# Patient Record
Sex: Female | Born: 1950 | Race: White | Hispanic: No
Health system: Western US, Academic
[De-identification: ages and names within clinical notes are randomized; demographics above are authoritative.]

## PROBLEM LIST (undated history)

## (undated) DIAGNOSIS — F32A Depression, unspecified: Secondary | ICD-10-CM

## (undated) DIAGNOSIS — R112 Nausea with vomiting, unspecified: Secondary | ICD-10-CM

## (undated) DIAGNOSIS — H269 Unspecified cataract: Secondary | ICD-10-CM

## (undated) DIAGNOSIS — R7302 Impaired glucose tolerance (oral): Secondary | ICD-10-CM

## (undated) DIAGNOSIS — I10 Essential (primary) hypertension: Secondary | ICD-10-CM

## (undated) DIAGNOSIS — E119 Type 2 diabetes mellitus without complications: Secondary | ICD-10-CM

## (undated) DIAGNOSIS — K589 Irritable bowel syndrome without diarrhea: Secondary | ICD-10-CM

## (undated) DIAGNOSIS — E1110 Type 2 diabetes mellitus with ketoacidosis without coma: Secondary | ICD-10-CM

## (undated) DIAGNOSIS — Z9889 Other specified postprocedural states: Secondary | ICD-10-CM

## (undated) DIAGNOSIS — F419 Anxiety disorder, unspecified: Secondary | ICD-10-CM

## (undated) DIAGNOSIS — T7840XA Allergy, unspecified, initial encounter: Secondary | ICD-10-CM

## (undated) DIAGNOSIS — F329 Major depressive disorder, single episode, unspecified: Secondary | ICD-10-CM

## (undated) DIAGNOSIS — K219 Gastro-esophageal reflux disease without esophagitis: Secondary | ICD-10-CM

## (undated) DIAGNOSIS — E785 Hyperlipidemia, unspecified: Secondary | ICD-10-CM

## (undated) DIAGNOSIS — K52839 Microscopic colitis, unspecified: Secondary | ICD-10-CM

## (undated) DIAGNOSIS — J45909 Unspecified asthma, uncomplicated: Secondary | ICD-10-CM

## (undated) DIAGNOSIS — Z923 Personal history of irradiation: Secondary | ICD-10-CM

## (undated) HISTORY — PX: PR TOTAL ABDOMINAL HYSTERECT W/WO RMVL TUBE OVARY: 58150

## (undated) HISTORY — DX: Microscopic colitis, unspecified: K52.839

## (undated) HISTORY — PX: SURGICAL HX OTHER: 99

## (undated) HISTORY — PX: HERNIA REPAIR: SHX5222

## (undated) HISTORY — PX: PR ARTHRP KNE CONDYLE&PLATU MEDIAL&LAT COMPARTMENTS: 27447

## (undated) HISTORY — PX: BREAST BIOPSY: SHX5072

## (undated) HISTORY — PX: BREAST LUMPECTOMY: SHX5074

## (undated) HISTORY — DX: Unspecified asthma, uncomplicated: J45.909

## (undated) HISTORY — DX: Gastro-esophageal reflux disease without esophagitis: K21.9

## (undated) HISTORY — DX: Depression, unspecified: F32.A

## (undated) HISTORY — DX: Essential (primary) hypertension: I10

## (undated) HISTORY — DX: Other specified postprocedural states: Z98.890

## (undated) HISTORY — PX: POLYPECTOMY: SHX149

## (undated) HISTORY — DX: Unspecified cataract: H26.9

## (undated) HISTORY — DX: Type 2 diabetes mellitus with ketoacidosis without coma: E11.10

## (undated) HISTORY — DX: Anxiety disorder, unspecified: F41.9

## (undated) HISTORY — DX: Impaired glucose tolerance (oral): R73.02

## (undated) HISTORY — DX: Nausea with vomiting, unspecified: R11.2

## (undated) HISTORY — PX: OOPHORECTOMY: SHX86

## (undated) HISTORY — PX: ABDOMINAL HYSTERECTOMY: SHX81

## (undated) HISTORY — PX: COLONOSCOPY: SHX174

## (undated) HISTORY — DX: Hyperlipidemia, unspecified: E78.5

## (undated) HISTORY — DX: Major depressive disorder, single episode, unspecified: F32.9

## (undated) HISTORY — DX: Irritable bowel syndrome, unspecified: K58.9

## (undated) HISTORY — DX: Type 2 diabetes mellitus without complications: E11.9

## (undated) HISTORY — PX: OTHER SURGICAL HISTORY: SHX169

## (undated) HISTORY — DX: Allergy, unspecified, initial encounter: T78.40XA

---
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
## 1992-02-21 HISTORY — PX: PR VAGINAL HYSTERECTOMY UTERUS 250 GM/<: 58260

## 1999-03-30 ENCOUNTER — Encounter: Payer: Self-pay | Admitting: Internal Medicine

## 1999-03-30 ENCOUNTER — Ambulatory Visit (HOSPITAL_COMMUNITY): Admission: RE | Admit: 1999-03-30 | Discharge: 1999-03-30 | Payer: Self-pay | Admitting: Internal Medicine

## 1999-10-18 ENCOUNTER — Other Ambulatory Visit: Admission: RE | Admit: 1999-10-18 | Discharge: 1999-10-18 | Payer: Self-pay | Admitting: Obstetrics and Gynecology

## 2000-10-18 ENCOUNTER — Other Ambulatory Visit: Admission: RE | Admit: 2000-10-18 | Discharge: 2000-10-18 | Payer: Self-pay | Admitting: Obstetrics and Gynecology

## 2002-05-15 ENCOUNTER — Encounter: Payer: Self-pay | Admitting: Internal Medicine

## 2002-05-15 ENCOUNTER — Encounter: Admission: RE | Admit: 2002-05-15 | Discharge: 2002-05-15 | Payer: Self-pay | Admitting: Internal Medicine

## 2002-11-18 ENCOUNTER — Emergency Department (HOSPITAL_COMMUNITY): Admission: EM | Admit: 2002-11-18 | Discharge: 2002-11-18 | Payer: Self-pay | Admitting: Emergency Medicine

## 2002-11-18 ENCOUNTER — Encounter: Payer: Self-pay | Admitting: Emergency Medicine

## 2004-01-07 ENCOUNTER — Ambulatory Visit: Payer: Self-pay | Admitting: Internal Medicine

## 2004-01-12 ENCOUNTER — Ambulatory Visit: Payer: Self-pay | Admitting: Internal Medicine

## 2004-02-21 HISTORY — PX: PR COLONOSCOPY STOMA DX INCLUDING COLLJ SPEC SPX: 44388

## 2004-03-09 ENCOUNTER — Ambulatory Visit (HOSPITAL_BASED_OUTPATIENT_CLINIC_OR_DEPARTMENT_OTHER): Admission: RE | Admit: 2004-03-09 | Discharge: 2004-03-09 | Payer: Self-pay | Admitting: Surgery

## 2004-03-09 ENCOUNTER — Ambulatory Visit (HOSPITAL_COMMUNITY): Admission: RE | Admit: 2004-03-09 | Discharge: 2004-03-09 | Payer: Self-pay | Admitting: Surgery

## 2004-03-09 ENCOUNTER — Encounter (INDEPENDENT_AMBULATORY_CARE_PROVIDER_SITE_OTHER): Payer: Self-pay | Admitting: *Deleted

## 2004-04-06 ENCOUNTER — Ambulatory Visit: Payer: Self-pay | Admitting: Internal Medicine

## 2004-04-13 ENCOUNTER — Ambulatory Visit: Payer: Self-pay | Admitting: Internal Medicine

## 2004-05-12 ENCOUNTER — Ambulatory Visit: Payer: Self-pay | Admitting: Internal Medicine

## 2004-05-13 ENCOUNTER — Ambulatory Visit (HOSPITAL_COMMUNITY): Admission: RE | Admit: 2004-05-13 | Discharge: 2004-05-13 | Payer: Self-pay | Admitting: Internal Medicine

## 2004-05-17 ENCOUNTER — Ambulatory Visit (HOSPITAL_COMMUNITY): Admission: RE | Admit: 2004-05-17 | Discharge: 2004-05-17 | Payer: Self-pay | Admitting: Obstetrics and Gynecology

## 2004-05-19 ENCOUNTER — Other Ambulatory Visit: Admission: RE | Admit: 2004-05-19 | Discharge: 2004-05-19 | Payer: Self-pay | Admitting: Obstetrics and Gynecology

## 2004-07-22 ENCOUNTER — Ambulatory Visit: Payer: Self-pay | Admitting: Internal Medicine

## 2004-07-29 ENCOUNTER — Ambulatory Visit: Payer: Self-pay | Admitting: Internal Medicine

## 2005-07-18 ENCOUNTER — Ambulatory Visit (HOSPITAL_COMMUNITY): Admission: RE | Admit: 2005-07-18 | Discharge: 2005-07-18 | Payer: Self-pay | Admitting: Internal Medicine

## 2005-09-05 ENCOUNTER — Ambulatory Visit: Payer: Self-pay | Admitting: Internal Medicine

## 2006-02-20 DIAGNOSIS — C50919 Malignant neoplasm of unspecified site of unspecified female breast: Secondary | ICD-10-CM

## 2006-02-20 HISTORY — DX: Malignant neoplasm of unspecified site of unspecified female breast: C50.919

## 2006-03-13 ENCOUNTER — Ambulatory Visit: Payer: Self-pay | Admitting: Internal Medicine

## 2006-03-13 LAB — CONVERTED CEMR LAB
ALT: 16 units/L (ref 0–40)
Alkaline Phosphatase: 59 units/L (ref 39–117)
BUN: 12 mg/dL (ref 6–23)
Basophils Relative: 1.4 % — ABNORMAL HIGH (ref 0.0–1.0)
Calcium: 9.5 mg/dL (ref 8.4–10.5)
Cholesterol: 257 mg/dL (ref 0–200)
Glucose, Bld: 116 mg/dL — ABNORMAL HIGH (ref 70–99)
HDL: 55.9 mg/dL (ref >39.0)
Monocytes Relative: 6.3 % (ref 3.0–11.0)
Neutro Abs: 2.8 10*3/uL (ref 1.4–7.7)
Platelets: 290 10*3/uL (ref 150–400)
Potassium: 3.8 meq/L (ref 3.5–5.1)
RDW: 12.6 % (ref 11.5–14.6)
TSH: 3.91 microintl units/mL (ref 0.35–5.50)
Total CHOL/HDL Ratio: 4.6
Total Protein: 6.9 g/dL (ref 6.0–8.3)
VLDL: 43 mg/dL — ABNORMAL HIGH (ref 0–40)

## 2006-09-15 ENCOUNTER — Encounter: Payer: Self-pay | Admitting: Internal Medicine

## 2006-09-15 DIAGNOSIS — E785 Hyperlipidemia, unspecified: Secondary | ICD-10-CM | POA: Insufficient documentation

## 2006-09-15 DIAGNOSIS — R7309 Other abnormal glucose: Secondary | ICD-10-CM

## 2007-05-08 ENCOUNTER — Ambulatory Visit: Payer: Self-pay | Admitting: Internal Medicine

## 2007-05-08 DIAGNOSIS — K222 Esophageal obstruction: Secondary | ICD-10-CM | POA: Insufficient documentation

## 2007-05-08 DIAGNOSIS — R079 Chest pain, unspecified: Secondary | ICD-10-CM

## 2007-05-08 DIAGNOSIS — F4323 Adjustment disorder with mixed anxiety and depressed mood: Secondary | ICD-10-CM

## 2007-05-08 DIAGNOSIS — R5381 Other malaise: Secondary | ICD-10-CM | POA: Insufficient documentation

## 2007-05-08 DIAGNOSIS — R5383 Other fatigue: Secondary | ICD-10-CM

## 2007-05-08 DIAGNOSIS — E739 Lactose intolerance, unspecified: Secondary | ICD-10-CM

## 2007-05-08 DIAGNOSIS — K219 Gastro-esophageal reflux disease without esophagitis: Secondary | ICD-10-CM

## 2007-05-08 DIAGNOSIS — K589 Irritable bowel syndrome without diarrhea: Secondary | ICD-10-CM

## 2007-05-09 ENCOUNTER — Telehealth (INDEPENDENT_AMBULATORY_CARE_PROVIDER_SITE_OTHER): Payer: Self-pay | Admitting: *Deleted

## 2007-05-09 LAB — CONVERTED CEMR LAB
ALT: 25 units/L (ref 0–35)
AST: 26 units/L (ref 0–37)
Basophils Relative: 0.9 % (ref 0.0–1.0)
Bilirubin, Direct: 0.1 mg/dL (ref 0.0–0.3)
CO2: 30 meq/L (ref 19–32)
Calcium: 10.7 mg/dL — ABNORMAL HIGH (ref 8.4–10.5)
Chloride: 99 meq/L (ref 96–112)
Creatinine, Ser: 0.8 mg/dL (ref 0.4–1.2)
Eosinophils Relative: 2.7 % (ref 0.0–5.0)
Glucose, Bld: 137 mg/dL — ABNORMAL HIGH (ref 70–99)
HDL: 48.3 mg/dL (ref >39.0)
Lymphocytes Relative: 35.2 % (ref 12.0–46.0)
Neutro Abs: 4 10*3/uL (ref 1.4–7.7)
Platelets: 324 10*3/uL (ref 150–400)
RBC: 5.24 M/uL — ABNORMAL HIGH (ref 3.87–5.11)
RDW: 12.2 % (ref 11.5–14.6)
Total Bilirubin: 0.8 mg/dL (ref 0.3–1.2)
Total CHOL/HDL Ratio: 7.1
Total Protein: 7.7 g/dL (ref 6.0–8.3)
VLDL: 45 mg/dL — ABNORMAL HIGH (ref 0–40)
WBC: 7.4 10*3/uL (ref 4.5–10.5)

## 2007-05-10 ENCOUNTER — Telehealth: Payer: Self-pay | Admitting: Internal Medicine

## 2007-05-14 ENCOUNTER — Ambulatory Visit: Payer: Self-pay

## 2007-05-14 ENCOUNTER — Encounter: Payer: Self-pay | Admitting: Internal Medicine

## 2007-05-16 ENCOUNTER — Ambulatory Visit: Payer: Self-pay | Admitting: Internal Medicine

## 2007-05-16 LAB — CONVERTED CEMR LAB
Alkaline Phosphatase: 66 units/L (ref 39–117)
Bilirubin, Direct: 0.1 mg/dL (ref 0.0–0.3)
HDL: 43.3 mg/dL (ref >39.0)
Total CHOL/HDL Ratio: 5.1
VLDL: 23 mg/dL (ref 0–40)

## 2007-05-21 ENCOUNTER — Ambulatory Visit: Payer: Self-pay | Admitting: Internal Medicine

## 2007-08-30 ENCOUNTER — Telehealth: Payer: Self-pay | Admitting: Internal Medicine

## 2008-06-30 ENCOUNTER — Ambulatory Visit: Payer: Self-pay | Admitting: Internal Medicine

## 2008-06-30 ENCOUNTER — Encounter (INDEPENDENT_AMBULATORY_CARE_PROVIDER_SITE_OTHER): Payer: Self-pay | Admitting: *Deleted

## 2008-06-30 DIAGNOSIS — R42 Dizziness and giddiness: Secondary | ICD-10-CM

## 2008-06-30 LAB — CONVERTED CEMR LAB
BUN: 19 mg/dL (ref 6–23)
Basophils Relative: 0.2 % (ref 0.0–3.0)
Chloride: 102 meq/L (ref 96–112)
Creatinine, Ser: 0.7 mg/dL (ref 0.4–1.2)
Eosinophils Absolute: 0.2 10*3/uL (ref 0.0–0.7)
Eosinophils Relative: 3 % (ref 0.0–5.0)
Glucose, Bld: 139 mg/dL — ABNORMAL HIGH (ref 70–99)
Hemoglobin: 13.7 g/dL (ref 12.0–15.0)
Lymphocytes Relative: 36 % (ref 12.0–46.0)
MCHC: 34.4 g/dL (ref 30.0–36.0)
MCV: 85.9 fL (ref 78.0–100.0)
Neutro Abs: 3 10*3/uL (ref 1.4–7.7)
Potassium: 4.3 meq/L (ref 3.5–5.1)
RBC: 4.66 M/uL (ref 3.87–5.11)
WBC: 5.5 10*3/uL (ref 4.5–10.5)

## 2009-01-06 ENCOUNTER — Ambulatory Visit (INDEPENDENT_AMBULATORY_CARE_PROVIDER_SITE_OTHER): Payer: BLUE CROSS/BLUE SHIELD | Admitting: Family Medicine

## 2009-01-06 ENCOUNTER — Encounter (INDEPENDENT_AMBULATORY_CARE_PROVIDER_SITE_OTHER): Payer: Self-pay | Admitting: Family Medicine

## 2009-01-06 VITALS — BP 100/70 | HR 80 | Temp 96.7°F | Wt 121.0 lb

## 2009-01-06 DIAGNOSIS — N760 Acute vaginitis: Secondary | ICD-10-CM

## 2009-01-06 DIAGNOSIS — Z853 Personal history of malignant neoplasm of breast: Secondary | ICD-10-CM | POA: Insufficient documentation

## 2009-01-06 DIAGNOSIS — R300 Dysuria: Secondary | ICD-10-CM

## 2009-01-06 DIAGNOSIS — K219 Gastro-esophageal reflux disease without esophagitis: Secondary | ICD-10-CM

## 2009-01-06 LAB — PR U/A AUTO DIPSTICK ONLY, ONSITE
Bilirubin (Indirect): NEGATIVE
Glucose, Urine: NEGATIVE mg/dL
Ketones, URN: NEGATIVE mg/dL
Nitrite, URN: NEGATIVE
Occult Blood, URN: NEGATIVE
Protein: NEGATIVE mg/dL
Specific Gravity, URN: 1.005 (ref 1.005–1.030)
Urobilinogen, URN: NORMAL E.U./dL
pH, Whole Blood: 6 (ref 5.0–8.0)

## 2009-01-06 MED ORDER — CIPRO 500 MG OR TABS
ORAL_TABLET | ORAL | Status: AC
Start: 2009-01-06 — End: 2009-01-16

## 2009-01-06 NOTE — Progress Notes (Signed)
Pt roomed by Nataliya Pilat, MA.

## 2009-01-06 NOTE — Progress Notes (Signed)
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   Character/Severity:  burning?: yes  frequency?: just started  Context:   recent antibiotics?: no         Assoc. Symptoms:  fever, shakes, chills/night sweats?: no  back pain?: other problem  vaginal d/c?: not with this, but having some vaginal d/c. Was under care of physician in [place]. She was put on Premarin, which seemed to work. She was on ABX prior to that , which did not work.     She has noted some old blood when she was treating with topical estrogen. She used an OTC antifungal and had a d/c of blood. After using the Premarin for two weeks and stopping had some old blood.     D/C started out yellow, started 4 mo after tamoxifen. She went off the tamoxifen and it did not go away. D/C is yellow to green. The Day before yesterday was was brown.     Modifying factors:   fluid intake?: tries  otc medications?: no    ROS:   Const: as above.   GU:as above.     PMHx: prior hx of UTI;   Past Medical History   Diagnosis Date   . MALIGN NEOPL BREAST NOS 2008     lumpectomy: Stage 0; Radiation; was ERP; She did not complete Tamoxifen due to side effects.        Past Medical History   Diagnosis Date   . MALIGN NEOPL BREAST NOS 2008     lumpectomy: Stage 0; Radiation; was ERP; She did not complete Tamoxifen due to side effects.        History   Social History   . Marital Status: Media planner     Spouse Name: N/A     Number of Children: N/A   . Years of Education: N/A   Occupational History   . Not on file.   Social History Main Topics   . Tobacco Use: Quit     Quit date: 01/07/1988   . Alcohol Use: 0.5 oz/week     1 drink(s) per week      2 glasses of wine daily   . Drug Use: Not on file   . Sexually Active: Not on file   Other Topics Concern   . Not on file   Social History Narrative   . No narrative on file       Currently sexually active?: not really (homosexual); not exposed to anything that  might be causing a discharge.     PE  VS: as above.  GEN:  cooperative; mildly ill.     SKIN:  no rash. Mild bluish discoloration of nose.   HEENT: Sclerae and conjunctivae clear.    ENT:  TMs are non erythematous. Oropharynx is normal.   NECK:  no noted limitation in ROM. Thyroid palpates within normal limits.   CHEST: Pulm: clear to auscultation in all fields.  Cor: RRR, with S1, S2; no S3, S4;  no murmur.    ABD: Nl appearance; nl bowel sounds.  No tenderness or organomegaly.  no suprapubic tenderness;  no costovertebral angle punch tenderness.  Declines genital examination.   NEURO: alert    Affect: nl.      Labs: urine notable for: urine with 2+ leukocytes.  (Patient claims urine was clean catch).          A/P  788.1  DYSURIA  (primary encounter diagnosis)  Plan: URINE C&S        616.10 VAGINAL Discharge, chronic    V10.3 PERS HX OF BREAST MALIGNANCY

## 2009-01-08 LAB — URINE C/S: Colony Count: 2000

## 2009-01-08 NOTE — Progress Notes (Addendum)
Addended by: CABRERA, ROSALYN on: 01/08/2009      Modules accepted: Orders

## 2009-03-12 ENCOUNTER — Ambulatory Visit: Payer: Self-pay | Admitting: Internal Medicine

## 2009-03-12 DIAGNOSIS — L659 Nonscarring hair loss, unspecified: Secondary | ICD-10-CM | POA: Insufficient documentation

## 2009-03-15 ENCOUNTER — Encounter: Payer: Self-pay | Admitting: Internal Medicine

## 2009-03-15 LAB — CONVERTED CEMR LAB: Vit D, 25-Hydroxy: 35 ng/mL (ref 30–89)

## 2009-03-16 ENCOUNTER — Ambulatory Visit (HOSPITAL_BASED_OUTPATIENT_CLINIC_OR_DEPARTMENT_OTHER): Admit: 2009-03-16 | Discharge: 2009-03-16 | Disposition: A | Discharge status: Home / Self Care | Payer: Self-pay

## 2009-03-16 LAB — CONVERTED CEMR LAB
ALT: 31 units/L (ref 0–35)
BUN: 9 mg/dL (ref 6–23)
Basophils Absolute: 0 10*3/uL (ref 0.0–0.1)
Bilirubin Urine: NEGATIVE
Bilirubin, Direct: 0 mg/dL (ref 0.0–0.3)
CO2: 27 meq/L (ref 19–32)
Chloride: 108 meq/L (ref 96–112)
Cholesterol: 299 mg/dL — ABNORMAL HIGH (ref 0–200)
Creatinine, Ser: 0.6 mg/dL (ref 0.4–1.2)
Eosinophils Absolute: 0.2 10*3/uL (ref 0.0–0.7)
Eosinophils Relative: 3.5 % (ref 0.0–5.0)
Glucose, Bld: 137 mg/dL — ABNORMAL HIGH (ref 70–99)
HCT: 38.9 % (ref 36.0–46.0)
Hgb A1c MFr Bld: 6.7 % — ABNORMAL HIGH (ref 4.6–6.5)
Lymphs Abs: 1.8 10*3/uL (ref 0.7–4.0)
MCHC: 33.6 g/dL (ref 30.0–36.0)
MCV: 88.8 fL (ref 78.0–100.0)
Monocytes Absolute: 0.3 10*3/uL (ref 0.1–1.0)
Neutrophils Relative %: 53 % (ref 43.0–77.0)
Nitrite: NEGATIVE
Platelets: 270 10*3/uL (ref 150.0–400.0)
Potassium: 4.3 meq/L (ref 3.5–5.1)
RDW: 12.4 % (ref 11.5–14.6)
Specific Gravity, Urine: >=1.03 (ref 1.000–1.030)
TSH: 2.1 microintl units/mL (ref 0.35–5.50)
Total Bilirubin: 0.4 mg/dL (ref 0.3–1.2)
Triglycerides: 180 mg/dL — ABNORMAL HIGH (ref 0.0–149.0)
Urobilinogen, UA: 0.2 (ref 0.0–1.0)
VLDL: 36 mg/dL (ref 0.0–40.0)
WBC: 4.8 10*3/uL (ref 4.5–10.5)
pH: 5 (ref 5.0–8.0)

## 2009-03-17 ENCOUNTER — Telehealth: Payer: Self-pay | Admitting: Internal Medicine

## 2009-03-18 ENCOUNTER — Ambulatory Visit: Payer: Self-pay | Admitting: Internal Medicine

## 2009-03-19 ENCOUNTER — Observation Stay (HOSPITAL_COMMUNITY): Admission: EM | Admit: 2009-03-19 | Discharge: 2009-03-19 | Payer: Self-pay | Admitting: Emergency Medicine

## 2009-03-23 ENCOUNTER — Encounter (INDEPENDENT_AMBULATORY_CARE_PROVIDER_SITE_OTHER): Payer: Self-pay | Admitting: Family Medicine

## 2009-03-23 ENCOUNTER — Encounter (INDEPENDENT_AMBULATORY_CARE_PROVIDER_SITE_OTHER): Payer: Self-pay | Admitting: Internal Medicine

## 2009-03-23 ENCOUNTER — Ambulatory Visit (INDEPENDENT_AMBULATORY_CARE_PROVIDER_SITE_OTHER): Payer: BLUE CROSS/BLUE SHIELD | Admitting: Family Medicine

## 2009-03-23 VITALS — BP 90/60 | HR 76 | Temp 97.6°F | Wt 118.0 lb

## 2009-03-23 DIAGNOSIS — R300 Dysuria: Secondary | ICD-10-CM

## 2009-03-23 DIAGNOSIS — Z139 Encounter for screening, unspecified: Secondary | ICD-10-CM

## 2009-03-23 LAB — PR U/A AUTO DIPSTICK ONLY, ONSITE
Bilirubin (Indirect): NEGATIVE
Glucose, Urine: NEGATIVE mg/dL
Ketones, URN: NEGATIVE mg/dL
Nitrite, URN: NEGATIVE
Occult Blood, URN: NEGATIVE
Protein: NEGATIVE mg/dL
Specific Gravity, URN: 1.005 (ref 1.005–1.030)
Urobilinogen, URN: NORMAL E.U./dL
pH, Whole Blood: 7 (ref 5.0–8.0)

## 2009-03-23 LAB — PR WET PREP/KOH/TEST, ONSITE
BAL RBC Count: NEGATIVE
Clue Cells: NEGATIVE
Trich: NEGATIVE
Yeast, URN: NEGATIVE

## 2009-03-23 NOTE — Progress Notes (Signed)
Paula Bowman is a 59 year old female who presents for bladder infection and vaginal discharge.  She has itching and burning. Paula Bowman and the day before were pretty bad. Just today the discharge is back. 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     Last U/A and urine culture results were reviewed with her. The last u/a showed 2+ wbcs just like todays; the urine cx was essentially negative.     ROS:   Const: otherwise feeling well.    GU: as above.     PMHx: has had hysterectomy, but ovaries are still in situ. SHx has female partner with her today.     PE  VS: as above.  GEN:  cooperative; mildly ill.     SKIN:  no rash.  HEENT: Sclerae and conjunctivae clear.    NECK:  no noted limitation in ROM.   CARDIOPULM:  No dyspnea; good evidenced ventilatory volumes.   ABD: Nl appearance; nl bowel sounds.  No tenderness or organomegaly.  No suprapubic tenderness;  no costovertebral angle punch tenderness.  GENITAL: Vagina and vulva are somewhat atrophic;  Minimal clear discharge is noted.  Cervix is surgically abscent.  There is no odor. Swab for wet mount and KOH.   NEURO: alert         Labs: urine notable for: 2+ leuk; Spec grav 1.005.    Wet mount and KOH are negative except for 2+ WBC.          A/P  788.1 DYSURIA  (primary encounter diagnosis)  Plan: URINE C&S, WET PREP/KOH/TEST ONSITE  Etiology is unclear.          V82.9 SCREENING FOR CONDITION NOS  Plan: U/A AUTO DIPSTICK ONLY    Plan: after some consideration the patient elects conservative measures only. She will try to increase her fluid intake.

## 2009-03-23 NOTE — Progress Notes (Signed)
Pt roomed by Nataliya Pilat, MA.

## 2009-03-25 ENCOUNTER — Telehealth (INDEPENDENT_AMBULATORY_CARE_PROVIDER_SITE_OTHER): Payer: Self-pay | Admitting: Family Medicine

## 2009-03-25 DIAGNOSIS — R300 Dysuria: Secondary | ICD-10-CM

## 2009-03-25 MED ORDER — CIPROFLOXACIN HCL 500 MG OR TABS
ORAL_TABLET | ORAL | Status: DC
Start: 2009-03-25 — End: 2009-04-04

## 2009-03-25 NOTE — Telephone Encounter (Signed)
Patient is leaving on a flight to [place].  This MUST be called in by 10:30am.

## 2009-03-25 NOTE — Telephone Encounter (Signed)
RX called in . Pt informed.

## 2009-03-25 NOTE — Telephone Encounter (Signed)
CONFIRMED PHONE NUMBER: [phone]  CALLERS FIRST AND LAST NAME: Ander Gaster  CALLERS RELATIONSHIP:Self  RETURN CALL: General message OK    SUBJECT: General Message     MESSAGE: Pt states that on 03/23/09 she discussed with Dr. Theda Sers medication for a bladder infection but did not get it at that time. 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  Pharmacy: [facility] in [place] fax: [phone], ph#: [phone]  FORWARD TO: Clinical Staff, [address]

## 2009-03-25 NOTE — Telephone Encounter (Signed)
Rx on printer: please call or fax to pharmacy noted below in [place], [place]

## 2009-03-26 LAB — URINE C/S: Colony Count: 3000

## 2009-05-26 ENCOUNTER — Ambulatory Visit: Payer: Self-pay | Admitting: Internal Medicine

## 2009-05-26 DIAGNOSIS — K8020 Calculus of gallbladder without cholecystitis without obstruction: Secondary | ICD-10-CM | POA: Insufficient documentation

## 2009-06-02 ENCOUNTER — Ambulatory Visit (HOSPITAL_BASED_OUTPATIENT_CLINIC_OR_DEPARTMENT_OTHER): Payer: BLUE CROSS/BLUE SHIELD | Admitting: Medical Oncology

## 2009-06-02 ENCOUNTER — Ambulatory Visit (HOSPITAL_BASED_OUTPATIENT_CLINIC_OR_DEPARTMENT_OTHER): Payer: BLUE CROSS/BLUE SHIELD

## 2009-06-02 ENCOUNTER — Ambulatory Visit: Payer: BLUE CROSS/BLUE SHIELD | Attending: Medical Oncology

## 2009-06-02 DIAGNOSIS — Z803 Family history of malignant neoplasm of breast: Secondary | ICD-10-CM | POA: Insufficient documentation

## 2009-06-02 DIAGNOSIS — D0590 Unspecified type of carcinoma in situ of unspecified breast: Secondary | ICD-10-CM

## 2009-06-02 DIAGNOSIS — N938 Other specified abnormal uterine and vaginal bleeding: Secondary | ICD-10-CM | POA: Insufficient documentation

## 2009-06-02 DIAGNOSIS — Z853 Personal history of malignant neoplasm of breast: Secondary | ICD-10-CM | POA: Insufficient documentation

## 2009-06-02 DIAGNOSIS — N925 Other specified irregular menstruation: Secondary | ICD-10-CM | POA: Insufficient documentation

## 2009-06-07 ENCOUNTER — Encounter: Payer: Self-pay | Admitting: Internal Medicine

## 2009-06-11 ENCOUNTER — Encounter (HOSPITAL_BASED_OUTPATIENT_CLINIC_OR_DEPARTMENT_OTHER): Payer: BLUE CROSS/BLUE SHIELD | Admitting: Psychologist

## 2009-07-06 ENCOUNTER — Other Ambulatory Visit (HOSPITAL_BASED_OUTPATIENT_CLINIC_OR_DEPARTMENT_OTHER): Payer: Self-pay | Admitting: Gynecology

## 2009-07-06 ENCOUNTER — Ambulatory Visit (HOSPITAL_BASED_OUTPATIENT_CLINIC_OR_DEPARTMENT_OTHER): Payer: BLUE CROSS/BLUE SHIELD | Admitting: Gynecology

## 2009-07-06 ENCOUNTER — Ambulatory Visit (HOSPITAL_BASED_OUTPATIENT_CLINIC_OR_DEPARTMENT_OTHER): Payer: BLUE CROSS/BLUE SHIELD

## 2009-07-06 ENCOUNTER — Other Ambulatory Visit: Payer: Self-pay | Admitting: Gynecology

## 2009-07-06 ENCOUNTER — Ambulatory Visit: Payer: BLUE CROSS/BLUE SHIELD | Attending: Gynecology

## 2009-07-06 DIAGNOSIS — N939 Abnormal uterine and vaginal bleeding, unspecified: Secondary | ICD-10-CM | POA: Insufficient documentation

## 2009-07-06 DIAGNOSIS — Z923 Personal history of irradiation: Secondary | ICD-10-CM | POA: Insufficient documentation

## 2009-07-06 DIAGNOSIS — Z853 Personal history of malignant neoplasm of breast: Secondary | ICD-10-CM

## 2009-07-06 DIAGNOSIS — N926 Irregular menstruation, unspecified: Secondary | ICD-10-CM | POA: Insufficient documentation

## 2009-07-06 DIAGNOSIS — N895 Stricture and atresia of vagina: Secondary | ICD-10-CM

## 2009-07-06 DIAGNOSIS — N950 Postmenopausal bleeding: Secondary | ICD-10-CM | POA: Insufficient documentation

## 2009-07-06 DIAGNOSIS — Z803 Family history of malignant neoplasm of breast: Secondary | ICD-10-CM | POA: Insufficient documentation

## 2009-07-06 DIAGNOSIS — Z124 Encounter for screening for malignant neoplasm of cervix: Secondary | ICD-10-CM | POA: Insufficient documentation

## 2009-07-06 DIAGNOSIS — N362 Urethral caruncle: Secondary | ICD-10-CM | POA: Insufficient documentation

## 2009-07-06 DIAGNOSIS — C50919 Malignant neoplasm of unspecified site of unspecified female breast: Secondary | ICD-10-CM

## 2009-07-06 LAB — PR US EXAM, PELVIC, LIMITED

## 2009-07-07 ENCOUNTER — Ambulatory Visit: Payer: BLUE CROSS/BLUE SHIELD | Attending: Psychologist | Admitting: Psychologist

## 2009-07-07 DIAGNOSIS — F410 Panic disorder [episodic paroxysmal anxiety] without agoraphobia: Secondary | ICD-10-CM

## 2009-07-07 DIAGNOSIS — F411 Generalized anxiety disorder: Secondary | ICD-10-CM | POA: Insufficient documentation

## 2009-07-08 LAB — CERVICAL CANCER SCREENING: Cytologic Impression: NEGATIVE

## 2009-08-20 ENCOUNTER — Ambulatory Visit: Payer: BLUE CROSS/BLUE SHIELD | Attending: Psychologist | Admitting: Psychologist

## 2009-08-20 DIAGNOSIS — F411 Generalized anxiety disorder: Secondary | ICD-10-CM | POA: Insufficient documentation

## 2009-08-20 DIAGNOSIS — F339 Major depressive disorder, recurrent, unspecified: Secondary | ICD-10-CM

## 2009-09-02 ENCOUNTER — Telehealth (INDEPENDENT_AMBULATORY_CARE_PROVIDER_SITE_OTHER): Payer: Self-pay | Admitting: Internal Medicine

## 2009-09-02 MED ORDER — ESOMEPRAZOLE MAGNESIUM 40 MG OR CPDR
DELAYED_RELEASE_CAPSULE | ORAL | Status: DC
Start: 2009-09-02 — End: 2010-10-07

## 2009-09-02 NOTE — Telephone Encounter (Addendum)
We have not filled Nexium before.  Patient reports taking 40mg .  We have 20mg  dose on file.    Call to patient, she confirms taking Nexium 40mg  once a day.  Requests 90 day supply to [facility].  To Dr. Theda Sers.

## 2009-09-02 NOTE — Telephone Encounter (Signed)
REASON FOR REQUEST: out of meds  MEDICATION: NEXIUM  CONCERN/QUESTION:   PRESCRIBING PROVIDER:OUTSIDE PROVIDER  DOSE TAKING NOW: 40MG   PHARMACY NAME, LOCATION, & PHONE #: MAIL ORDER TO [facility] , FAX [phone],    PLEASE CONTACT PT @ [phone] WHEN APPROVED.

## 2009-09-02 NOTE — Telephone Encounter (Signed)
Patient does not need call back unless there are problems with refilling.

## 2009-09-16 ENCOUNTER — Encounter (INDEPENDENT_AMBULATORY_CARE_PROVIDER_SITE_OTHER): Payer: Self-pay | Admitting: Geriatric Medicine

## 2009-09-16 ENCOUNTER — Ambulatory Visit (INDEPENDENT_AMBULATORY_CARE_PROVIDER_SITE_OTHER): Payer: BLUE CROSS/BLUE SHIELD | Admitting: Geriatric Medicine

## 2009-09-16 ENCOUNTER — Ambulatory Visit (INDEPENDENT_AMBULATORY_CARE_PROVIDER_SITE_OTHER): Payer: BLUE CROSS/BLUE SHIELD | Admitting: Family Medicine

## 2009-09-16 VITALS — BP 82/58 | HR 76 | Temp 98.2°F | Wt 117.0 lb

## 2009-09-16 DIAGNOSIS — Z130 Encounter for screening for diseases of the blood and blood-forming organs and certain disorders involving the immune mechanism: Secondary | ICD-10-CM

## 2009-09-16 DIAGNOSIS — Z13220 Encounter for screening for lipoid disorders: Secondary | ICD-10-CM

## 2009-09-16 DIAGNOSIS — B351 Tinea unguium: Secondary | ICD-10-CM

## 2009-09-16 DIAGNOSIS — Z131 Encounter for screening for diabetes mellitus: Secondary | ICD-10-CM

## 2009-09-16 MED ORDER — TERBINAFINE HCL 250 MG OR TABS
ORAL_TABLET | ORAL | Status: DC
Start: 2009-09-16 — End: 2011-02-03

## 2009-09-16 NOTE — Progress Notes (Signed)
Paula Bowman is a 60yo woman here with   Cc: toenail fungus on R 1st toenail    She previously had a fungus on both big toes: treated with lamisil orally and topical cream  (dxed by derm with toenail clipping bx); this was years ago    ROS:  No other rashes on her skin      Past Medical History   Diagnosis Date   . MALIGN NEOPL BREAST NOS 2008     lumpectomy: Stage 0; Radiation; was ERP; She did not complete Tamoxifen due to side effects.    . ESOPHAGEAL REFLUX        Past Surgical History   Procedure Date   . Vaginal hysterectomy 1994     Ovaries in place   . Knee, r knee at age 45, removed floating bone           Current outpatient prescriptions: Esomeprazole Magnesium (NEXIUM) 40 MG Oral CAPSULE DELAYED RELEASE, one capsule by mouth daily, Disp: 90 Cap, Rfl: 3;  Hyoscyamine Sulfate (NULEV) 0.125 MG OR TBDP, None Entered, Disp: , Rfl: ;  Imipramine HCl (TOFRANIL) 25 MG Oral Tab, 1 TABLET  DAILY for anxiety , Disp: , Rfl:     Review of patient's allergies indicates:  Allergies   Allergen Reactions   . Amoxicillin Rash   . Penicillin G Rash       Physical exam  VS: BP 82/58  Pulse 76  Temp(Src) 98.2 F (36.8 C) (Temporal)  Wt 117 lb (53.071 kg)  LMP Hysterectomy  GEN: in no apparent distress  EXT: RIGHT 1st toe with thickening, brittleness; painted with french pedicure so unable to assess coloration      ASSESSMENT/PLAN:  110.1 DERMATOPHYTOSIS OF NAIL  (primary encounter diagnosis)  Comment: rx for 90 day course of lamisil  Plan: TERBINAFINE HCL 250 MG OR TABS, COMPREHENSIVE         METABOLIC PANEL    She will return in 1 month for check of LFTs and fasting labs prior to routine physical   V77.91 SCREENING FOR LIPOID DISORDERS  Plan: LIPID PANEL    V77.1 SCREENING-DIABETES MELLITUS  Plan: COMPREHENSIVE METABOLIC PANEL    V78.0 SCREENING-IRON DEFIC ANEMIA  Plan: CBC (HEMOGRAM)

## 2009-09-24 ENCOUNTER — Encounter (HOSPITAL_BASED_OUTPATIENT_CLINIC_OR_DEPARTMENT_OTHER): Payer: BLUE CROSS/BLUE SHIELD | Admitting: Psychologist

## 2009-09-28 ENCOUNTER — Encounter (INDEPENDENT_AMBULATORY_CARE_PROVIDER_SITE_OTHER): Payer: Self-pay | Admitting: Internal Medicine

## 2009-09-28 ENCOUNTER — Ambulatory Visit (INDEPENDENT_AMBULATORY_CARE_PROVIDER_SITE_OTHER): Payer: BLUE CROSS/BLUE SHIELD | Admitting: Internal Medicine

## 2009-09-28 VITALS — BP 110/70 | HR 62 | Temp 98.7°F | Resp 18 | Wt 117.0 lb

## 2009-09-28 DIAGNOSIS — R300 Dysuria: Secondary | ICD-10-CM

## 2009-09-28 DIAGNOSIS — N760 Acute vaginitis: Secondary | ICD-10-CM

## 2009-09-28 LAB — PR U/A AUTO DIPSTICK ONLY, ONSITE
Bilirubin (Indirect): NEGATIVE
Glucose, Urine: NEGATIVE mg/dL
Ketones, URN: NEGATIVE mg/dL
Nitrite, URN: NEGATIVE
Occult Blood, URN: NEGATIVE
Protein: NEGATIVE mg/dL
Specific Gravity, URN: 1.005 (ref 1.005–1.030)
Urobilinogen, URN: NORMAL E.U./dL
pH, Whole Blood: 7 (ref 5.0–8.0)

## 2009-09-28 MED ORDER — CIPROFLOXACIN HCL 500 MG OR TABS
ORAL_TABLET | ORAL | Status: DC
Start: 2009-09-28 — End: 2010-09-01

## 2009-09-28 NOTE — Progress Notes (Signed)
Patient comes in today for some dysuria, urinary frequency, no urinary urgency, symptoms started yesterday. Patient feeling better today. Patient reports of similar symptoms in 03/2009. KOH, urine done, I reviewed this with patient. Patient was tx with Cipro at that time which afford some relief. Patient saw her gyne, told to have atrophic vaginitis and patient decline premarin cream due to her breast cancer history. No skin rash.     -------- Copied from Epic------------    Urine C&S 03/2009 grew E coli sensitive to cipro.    ------------------------------------------------------      Review of Systems -   General, GU as above; except for above the rest of the review of system is negative/normal.    Patient Active Problem List   Diagnoses Code   . PERS HX OF BREAST MALIGNANCY V10.3   . VAGINAL Discharge, chronic 616.10   . DERMATOPHYTOSIS OF NAIL 110.1          Current outpatient prescriptions: Esomeprazole Magnesium (NEXIUM) 40 MG Oral CAPSULE DELAYED RELEASE, one capsule by mouth daily, Disp: 90 Cap, Rfl: 3;  Hyoscyamine Sulfate (NULEV) 0.125 MG OR TBDP, None Entered, Disp: , Rfl: ;  Imipramine HCl (TOFRANIL) 25 MG Oral Tab, 1 TABLET  DAILY for anxiety , Disp: , Rfl: ;  Terbinafine HCl 250 MG Oral Tab, 1 tab daily, Disp: 90 Tab, Rfl: 0    History   Substance Use Topics   . Tobacco Use: Quit     Quit date: 01/07/1988   . Alcohol Use: 0.5 oz/week     1 drink(s) per week      2 glasses of wine daily         Physical Examination:  BP 110/70  Pulse 62  Temp(Src) 98.7 F (37.1 C) (Oral)  Resp 18  Wt 117 lb (53.071 kg)  LMP Hysterectomy  General: Awake, alert, not in apparent distress.  HEENT: Conjuctivae clear, PERRL  Lungs: Clear to auscultation  Heart: RRR, S1/2 normal, no S3/4  Abdomen: NABS, soft, non-distended, non-tender, no CVAT  Extremities: No edema, cyanosis or clubbing  Gyne/pelvic patient decline.    ASSESSMENT and PLAN:  788.1 DYSURIA  (primary encounter diagnosis)  Comment: Discussed with patient in  length, questions answered, patient verbalized understanding.   Patient symptoms possibly multifactorial to include atrophic vaginitis as part of her problem.  Plan: U/A AUTO DIPSTICK ONLY, CIPROFLOXACIN HCL 500         MG OR TABS        F/u with gyne.  Last urine C&S did grew E coli sensitive to cipro.  Discussed medication dosage, usage, goals of therapy, and side effects.   Follow up prn if worse or no resolution of symptoms in 1 week  Call if signs and symptoms discussed occurs or if with problem or questions.     616.10 VAGINAL Discharge, chronic  Comment: Discussed with patient in length, questions answered, patient verbalized understanding.   Plan:   F/u with gyne.  Call if signs and symptoms discussed occurs or if with problem or questions.    I spent 25 minutes with patient, more than 50% of the service spent in counseling and coordinating care.

## 2009-09-28 NOTE — Progress Notes (Signed)
Pt here concerns of started out with vaginal itching then burning with urination.  Pt said she started to get these symptoms since starting the antifungal meds, terbinafine

## 2009-10-01 ENCOUNTER — Ambulatory Visit: Payer: BLUE CROSS/BLUE SHIELD | Attending: Medical Oncology | Admitting: Psychologist

## 2009-10-01 DIAGNOSIS — C50919 Malignant neoplasm of unspecified site of unspecified female breast: Secondary | ICD-10-CM | POA: Insufficient documentation

## 2009-10-01 DIAGNOSIS — F411 Generalized anxiety disorder: Secondary | ICD-10-CM

## 2009-10-18 ENCOUNTER — Other Ambulatory Visit (INDEPENDENT_AMBULATORY_CARE_PROVIDER_SITE_OTHER): Payer: BLUE CROSS/BLUE SHIELD

## 2009-10-18 DIAGNOSIS — Z13220 Encounter for screening for lipoid disorders: Secondary | ICD-10-CM

## 2009-10-18 DIAGNOSIS — Z130 Encounter for screening for diseases of the blood and blood-forming organs and certain disorders involving the immune mechanism: Secondary | ICD-10-CM

## 2009-10-18 DIAGNOSIS — Z131 Encounter for screening for diabetes mellitus: Secondary | ICD-10-CM

## 2009-10-18 DIAGNOSIS — B351 Tinea unguium: Secondary | ICD-10-CM

## 2009-10-18 LAB — COMPREHENSIVE METABOLIC PANEL
ALT (GPT): 27 U/L (ref 6–40)
AST (GOT): 33 U/L (ref 15–40)
Albumin: 3.9 g/dL (ref 3.5–5.2)
Alkaline Phosphatase (Total): 55 U/L (ref 31–132)
Anion Gap: 6 (ref 3–11)
Bilirubin (Total): 0.8 mg/dL (ref 0.2–1.3)
Calcium: 9.6 mg/dL (ref 8.9–10.2)
Carbon Dioxide, Total: 30 mEq/L (ref 22–32)
Chloride: 102 mEq/L (ref 98–108)
Creatinine: 0.81 mg/dL (ref 0.38–1.02)
GFR, Calc, African American: >60 mL/min (ref >59)
GFR, Calc, European American: >60 mL/min (ref >59)
Glucose: 80 mg/dL (ref 62–125)
Potassium: 4.4 mEq/L (ref 3.7–5.2)
Protein (Total): 6.5 g/dL (ref 6.0–8.2)
Sodium: 138 mEq/L (ref 136–145)
Urea Nitrogen: 18 mg/dL (ref 8–21)

## 2009-10-18 LAB — LIPID PANEL
Cholesterol (LDL): 156 mg/dL — ABNORMAL HIGH (ref <130)
Cholesterol/HDL Ratio: 3
HDL Cholesterol: 84 mg/dL (ref >40)
Non-HDL Cholesterol: 165 mg/dL — ABNORMAL HIGH (ref 0–159)
Total Cholesterol: 249 mg/dL — ABNORMAL HIGH (ref <200)
Triglyceride: 47 mg/dL (ref <150)

## 2009-10-18 LAB — CBC (HEMOGRAM)
Hematocrit: 38 % (ref 36–45)
Hemoglobin: 12.9 g/dL (ref 11.5–15.5)
MCH: 33.1 pg (ref 27.3–33.6)
MCHC: 33.9 g/dL (ref 32.2–36.5)
MCV: 97 fL (ref 81–98)
Platelet Count: 225 10*3/uL (ref 150–400)
RBC: 3.9 mil/uL (ref 3.80–5.00)
RDW-CV: 12.5 % (ref 11.6–14.4)
WBC: 3.2 10*3/uL — ABNORMAL LOW (ref 4.3–10.0)

## 2009-10-18 NOTE — Progress Notes (Signed)
Addended byRea College on: 10/18/2009 08:50 AM     Modules accepted: Orders

## 2009-10-19 ENCOUNTER — Encounter (INDEPENDENT_AMBULATORY_CARE_PROVIDER_SITE_OTHER): Payer: Self-pay | Admitting: Geriatric Medicine

## 2009-11-03 ENCOUNTER — Encounter (HOSPITAL_BASED_OUTPATIENT_CLINIC_OR_DEPARTMENT_OTHER): Payer: BLUE CROSS/BLUE SHIELD | Admitting: Adult Health

## 2009-11-03 ENCOUNTER — Other Ambulatory Visit (HOSPITAL_BASED_OUTPATIENT_CLINIC_OR_DEPARTMENT_OTHER): Payer: BLUE CROSS/BLUE SHIELD

## 2009-11-10 ENCOUNTER — Ambulatory Visit (HOSPITAL_BASED_OUTPATIENT_CLINIC_OR_DEPARTMENT_OTHER): Payer: BLUE CROSS/BLUE SHIELD

## 2009-11-10 ENCOUNTER — Ambulatory Visit: Payer: BLUE CROSS/BLUE SHIELD | Attending: Medical Oncology | Admitting: Adult Health

## 2009-11-10 ENCOUNTER — Other Ambulatory Visit: Payer: Self-pay | Admitting: Medical Oncology

## 2009-11-10 ENCOUNTER — Other Ambulatory Visit (HOSPITAL_BASED_OUTPATIENT_CLINIC_OR_DEPARTMENT_OTHER): Payer: Self-pay | Admitting: Internal Medicine

## 2009-11-10 DIAGNOSIS — Z803 Family history of malignant neoplasm of breast: Secondary | ICD-10-CM | POA: Insufficient documentation

## 2009-11-10 DIAGNOSIS — D0590 Unspecified type of carcinoma in situ of unspecified breast: Secondary | ICD-10-CM

## 2009-11-10 DIAGNOSIS — Z853 Personal history of malignant neoplasm of breast: Secondary | ICD-10-CM

## 2009-11-10 LAB — MAMMOGRAM, RIGHT BREAST DIAGNOSTIC

## 2009-11-11 LAB — MRI, BOTH BREASTS

## 2009-11-19 ENCOUNTER — Ambulatory Visit (INDEPENDENT_AMBULATORY_CARE_PROVIDER_SITE_OTHER): Payer: BLUE CROSS/BLUE SHIELD | Admitting: Nurse Practitioner

## 2009-11-19 VITALS — BP 110/68 | HR 72 | Resp 12 | Wt 118.0 lb

## 2009-11-19 DIAGNOSIS — R439 Unspecified disturbances of smell and taste: Secondary | ICD-10-CM

## 2009-11-19 NOTE — Progress Notes (Signed)
Patient presents with No chief complaint on file.      Past Medical History   Diagnosis Date   . MALIGN NEOPL BREAST NOS 2008     lumpectomy: Stage 0; Radiation; was ERP; She did not complete Tamoxifen due to side effects.    . ESOPHAGEAL REFLUX         Past Surgical History   Procedure Date   . Vaginal hysterectomy 1994     Ovaries in place   . Knee, r knee at age 59, removed floating bone         HPI:  Paula Bowman is a 59 year old female with change in taste, things taste funny wine tastes like gasoline, and i have this sore on my upper gum line, it is getting better [place] in [place] a few weeks ago not sure if it is food related. No fevers, or other open lesions. No change in diet was on lamisil and stopped one week ago thinking that might be causing the taste changes.      ROS:  Nail fungus     OBJECTIVE:  BP 110/68  Pulse 72  Resp 12  Wt 118 lb (53.524 kg)  General appearance: Alert, skin warm dry color good, afebrile, no constitutional symptoms.   No sinus tenderness  Ears: R TM - normal, L TM - normal  Nose: normal and nares patent  Oropharynx: normal with the exception of a sm. Red area on the upper gum line between the two front teeth, is resolving. Tongue without signs of yeast.   Neck: supple,no adenopathy,thyroid normal size, non-tender,  without nodularity      ASSESSMENT/PLAN:   SMELL & TASTE DISTURB  (primary encounter diagnosis)  Comment: researched lamisil side effects and taste disturbance was in the common category of side effects, rec. Salt water rinses and time to clear the lamisil before taste is restored, if not resolved in 1 month then would consider ent. Ref.

## 2010-03-22 NOTE — Progress Notes (Signed)
Summary: Cipro rx  Phone Note Call from Patient Call back at Home Phone [phone]   Summary of Call: Patient called triage stating that she has been waiting for her Cipro prescription to be sent to her pharmacy. I sent prescription and notified patient. Initial call taken by: Lucious Groves,  March 17, 2009 1:25 PM  Follow-up for Phone Call        Thx Follow-up by: Tresa Garter MD,  March 17, 2009 4:07 PM    New/Updated Medications: CIPROFLOXACIN HCL 250 MG TABS (CIPROFLOXACIN HCL) 1 by mouth bid Prescriptions: CIPROFLOXACIN HCL 250 MG TABS (CIPROFLOXACIN HCL) 1 by mouth bid  #10 x 0   Entered by:   Lucious Groves   Authorized by:   Tresa Garter MD   Signed by:   Lucious Groves on 03/17/2009   Method used:   Electronically to        [id number] (retail)       [address]       [place], [place]  [address]       Ph: [phone] or [phone]       Fax: [phone]   RxID:   [id number]

## 2010-03-22 NOTE — Assessment & Plan Note (Signed)
Summary: 3 MO ROV /NWS  #   Vital Signs:  Patient profile:   60 year old female Height:      70 inches (177.80 cm) Weight:      189.12 pounds (85.96 kg) BMI:     27.23 O2 Sat:      98 % on Room air Temp:     98.0 degrees F (36.67 degrees C) oral Pulse rate:   82 / minute BP sitting:   110 / 82  (left arm) Cuff size:   regular  Vitals Entered By: Orlan Leavens (May 26, 2009 2:51 PM)  O2 Flow:  Room air CC: 3 month follow-up Is Patient Diabetic? No Pain Assessment Patient in pain? no        Primary Care Provider:  Georgina Quint Saleh Ulbrich MD  CC:  3 month follow-up.  History of Present Illness: The patient presents for a post-hospital visit for CP She had abn abd [place] F/u hair loss - better C/o depression  Current Medications (verified): 1)  Adult Aspirin Low Strength 81 Mg  Tbdp (Aspirin) .Marland Kitchen.. 1 By Mouth Qd 2)  Xanax 1 Mg  Tabs (Alprazolam) .Marland Kitchen.. 1 By Mouth Two Times A Day Prn 3)  Diovan 160 Mg Tabs (Valsartan) .Marland Kitchen.. 1 By Mouth Qd 4)  Vitamin D3 1000 Unit  Tabs (Cholecalciferol) .Marland Kitchen.. 1 By Mouth Daily  Allergies (verified): 1)  ! Penicillin 2)  ! 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    Physical Exam  General:  alert, well-developed, well-nourished, and cooperative to examination.    Nose:  External nasal examination shows no deformity or inflammation. Nasal mucosa are pink and moist without lesions or exudates. Mouth:  teeth and gums in good repair; mucous membranes moist, without lesions or ulcers.  oropharynx clear without exudate, erythema.  Neck:  No deformities, masses, or tenderness noted. Lungs:  normal respiratory effort, no intercostal retractions or use of accessory muscles; normal breath sounds bilaterally - no crackles and no wheezes.    Heart:  normal rate, regular rhythm, no murmur, and no rub. BLE without edema. normal DP pulses and normal cap refill in all 4 extremities    Abdomen:  Bowel sounds positive,abdomen soft and non-tender without masses, organomegaly or hernias noted. Msk:  No deformity or scoliosis noted of thoracic or lumbar spine.   Extremities:  no edema, no ulcers  Neurologic:  No cranial nerve deficits noted. Station and gait are normal. Plantar reflexes are down-going bilaterally. DTRs are symmetrical throughout. Sensory, motor and coordinative functions appear intact. Skin:  Intact without suspicious lesions or rashes. Frontal hair thinning present on scalp Psych:  Oriented X3, normally interactive, good eye contact, not suicidal, depressed affect, and subdued.     Impression & Recommendations:  Problem # 1:  CHOLELITHIASIS (ICD-574.20) Assessment New Ceftin if pain/fever Orders: Surgical Referral (Surgery) Dr Freida Busman  COMPLETE ABDOMINAL ULTRASOUND 03/19/2009:    Comparison:  None.    Findings:    Gallbladder:  Contracted gallbladder containing numerous shadowing   gallstones.  Mild gallbladder wall thickening.  No pericholecystic   fluid.  Negative  sonographic Murphy's sign according to the   ultrasound technologist.    Common bile duct:  Normal in caliber with maximum diameter   approximating 4 mm.    Liver:  Normal size and echotexture without focal parenchymal   abnormality.  Patent portal vein .    IVC:  Patent.    Pancreas:  Although the pancreas is difficult to visualize in its   entirety, no focal pancreatic abnormality is identified.    Spleen:  Normal size and echotexture without focal parenchymal   abnormality.    Right  Kidney:  No hydronephrosis.  Well-preserved cortex.  Normal   size and parenchymal echotexture without focal abnormalities.    Left Kidney:  No hydronephrosis.  Well-preserved cortex.  Normal   size and parenchymal echotexture without focal abnormalities.    Abdominal aorta:  Normal in caliber throughout its visualized   course in the abdomen.    IMPRESSION:    1.  Contracted gallbladder containing numerous gallstones.  Mild   gallbladder wall thickening.  No pericholecystic fluid.  Negative   sonographic Murphy's sign.   2.  Otherwise normal abdominal ultrasound.    Read By:  Arnell Sieving,  M.D.   Released By:  Arnell Sieving,  M.D.  Problem # 2:  HAIR LOSS (ICD-704.00) due to HCTZ Assessment: Improved  Problem # 3:  FATIGUE (ICD-780.79) due to #4 Assessment: Unchanged  Problem # 4:  DEPRESSION (ICD-311) Assessment: Deteriorated  Her updated medication list for this problem includes:    Xanax 1 Mg Tabs (Alprazolam) .Marland Kitchen... 1 by mouth two times a day prn    Wellbutrin Sr 150 Mg Xr12h-tab (Bupropion hcl) .Marland Kitchen... 1 by mouth q am and q lunch (two times a day)  Complete Medication List: 1)  Adult Aspirin Low Strength 81 Mg Tbdp (Aspirin) .Marland Kitchen.. 1 by mouth qd 2)  Xanax 1 Mg Tabs (Alprazolam) .Marland Kitchen.. 1 by mouth two times a day prn 3)  Diovan 160 Mg Tabs (Valsartan) .Marland Kitchen.. 1 by mouth qd 4)  Vitamin D3 1000 Unit Tabs (Cholecalciferol) .Marland Kitchen.. 1 by mouth daily 5)  Wellbutrin Sr 150 Mg Xr12h-tab (Bupropion hcl) .Marland Kitchen.. 1 by mouth q am and q lunch (two times a day) 6)  Ceftin 500 Mg Tabs (Cefuroxime axetil) .Marland Kitchen.. 1 by mouth bid  Patient Instructions: 1)  Please schedule a follow-up appointment in 2 months. Prescriptions: CEFTIN 500 MG TABS (CEFUROXIME AXETIL) 1 by mouth bid  #20 x 1   Entered and Authorized by:   Tresa Garter MD   Signed by:   Tresa Garter MD on 05/26/2009   Method used:   Print then Give to Patient   RxID:   [id number] WELLBUTRIN SR 150 MG XR12H-TAB  (BUPROPION HCL) 1 by mouth q am and q lunch (two times a day)  #60 x 6   Entered and Authorized by:   Tresa Garter MD   Signed by:   Tresa Garter MD on 05/26/2009   Method used:   Print then Give to Patient   RxID:   [id number]

## 2010-03-22 NOTE — Letter (Signed)
Summary: [facility]   Imported By: Sherian Rein 07/06/2009 07:58:28  _____________________________________________________________________  External Attachment:    Type:   Image     Comment:   External Document

## 2010-03-22 NOTE — Assessment & Plan Note (Signed)
Summary: FU-D/T-STC   Vital Signs:  Patient profile:   60 year old female Weight:      191 pounds Temp:     98 degrees F oral Pulse rate:   83 / minute BP sitting:   122 / 84  (left arm)  Vitals Entered By: Tora Perches (March 12, 2009 3:53 PM) CC: f/u Is Patient Diabetic? No   Primary Care Provider:  Tresa Garter MD  CC:  f/u.  History of Present Illness: For a check up - HTN, fatigue, anxiety C/o hair loss x 12 mo  Preventive Screening-Counseling & Management  Alcohol-Tobacco     Smoking Status: never  Current Medications (verified): 1)  Diovan Hct 160-12.5 Mg  Tabs (Valsartan-Hydrochlorothiazide) .... Once Daily 2)  Adult Aspirin Low Strength 81 Mg  Tbdp (Aspirin) .Marland Kitchen.. 1 By Mouth Qd 3)  Vitamin D3 1000 Unit  Tabs (Cholecalciferol) .Marland Kitchen.. 1 By Mouth Daily 4)  Xanax 1 Mg  Tabs (Alprazolam) .Marland Kitchen.. 1 By Mouth Two Times A Day Prn 5)  Enjuvia 1.25 Mg Tabs (Estrogens Conj Synthetic B) .... Take 1 By Mouth Qd  Allergies: 1)  ! Penicillin 2)  ! 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    Physical Exam  General:  alert, well-developed, well-nourished, and cooperative to examination.    Nose:  External nasal examination shows no deformity or inflammation. Nasal mucosa are pink and moist without lesions or exudates. Mouth:  teeth and gums in good repair; mucous membranes moist, without lesions or ulcers. oropharynx clear without exudate, erythema.  Neck:  No deformities, masses, or  tenderness noted. Lungs:  normal respiratory effort, no intercostal retractions or use of accessory muscles; normal breath sounds bilaterally - no crackles and no wheezes.    Heart:  normal rate, regular rhythm, no murmur, and no rub. BLE without edema. normal DP pulses and normal cap refill in all 4 extremities    Abdomen:  Bowel sounds positive,abdomen soft and non-tender without masses, organomegaly or hernias noted. Msk:  No deformity or scoliosis noted of thoracic or lumbar spine.   Extremities:  no edema, no ulcers  Skin:  Intact without suspicious lesions or rashes. Frontal hair thinning present on scalp Psych:  Cognition and judgment appear intact. Alert and cooperative with normal attention span and concentration. No apparent delusions, illusions, hallucinations   Impression & Recommendations:  Problem # 1:  HAIR LOSS (ICD-704.00) unclear etiology Assessment New  Orders: TLB-A1C / Hgb A1C (Glycohemoglobin) (83036-A1C) TLB-B12, Serum-Total ONLY (16109-U04) TLB-BMP (Basic Metabolic Panel-BMET) (80048-METABOL) TLB-CBC Platelet - w/Differential (85025-CBCD) TLB-Hepatic/Liver Function Pnl (80076-HEPATIC) TLB-Lipid Panel (80061-LIPID) TLB-Sedimentation Rate (ESR) (85652-ESR) TLB-TSH (Thyroid Stimulating Hormone) (84443-TSH) T-Vitamin D (25-Hydroxy) (54098-11914) TLB-Udip ONLY (81003-UDIP) T-ANA (78295-62130)  Problem # 2:  HYPERTENSION (ICD-401.9) Assessment: Unchanged  The following medications were removed from the medication list:    Diovan Hct 160-12.5 Mg Tabs (Valsartan-hydrochlorothiazide) ..... Once daily Her updated medication list for this problem includes:    Diovan 160 Mg Tabs (Valsartan) .Marland Kitchen... 1 by mouth qd  Problem # 3:  GERD (ICD-530.81) Assessment: Improved  Problem # 4:  ANXIETY (ICD-300.00) Assessment: Unchanged  Her updated medication list for this problem includes:    Xanax 1 Mg Tabs (Alprazolam) .Marland Kitchen... 1 by mouth two times a day  prn  Orders: TLB-A1C / Hgb A1C (Glycohemoglobin) (83036-A1C) TLB-B12, Serum-Total ONLY (72536-U44) TLB-BMP (Basic Metabolic Panel-BMET) (80048-METABOL) TLB-CBC Platelet - w/Differential (85025-CBCD) TLB-Hepatic/Liver Function Pnl (80076-HEPATIC) TLB-Lipid Panel (80061-LIPID) TLB-Sedimentation Rate (ESR) (85652-ESR) TLB-TSH (Thyroid Stimulating Hormone) (84443-TSH) T-Vitamin D (25-Hydroxy) (03474-25956) TLB-Udip ONLY (81003-UDIP) T-ANA (38756-43329)  Problem # 5:  FATIGUE (ICD-780.79) - not bad Assessment: Comment Only  Orders: TLB-A1C / Hgb A1C (Glycohemoglobin) (83036-A1C) TLB-B12, Serum-Total ONLY (51884-Z66) TLB-BMP (Basic Metabolic Panel-BMET) (80048-METABOL) TLB-CBC Platelet - w/Differential (85025-CBCD) TLB-Hepatic/Liver Function Pnl (80076-HEPATIC) TLB-Lipid Panel (80061-LIPID) TLB-Sedimentation Rate (ESR) (85652-ESR) TLB-TSH (Thyroid Stimulating Hormone) (84443-TSH) T-Vitamin D (25-Hydroxy) (06301-60109) TLB-Udip ONLY (81003-UDIP) T-ANA (32355-73220)  Complete Medication List: 1)  Adult Aspirin Low Strength 81 Mg Tbdp (Aspirin) .Marland Kitchen.. 1 by mouth qd 2)  Xanax 1 Mg Tabs (Alprazolam) .Marland Kitchen.. 1 by mouth two times a day prn 3)  Enjuvia 1.25 Mg Tabs (Estrogens conj synthetic b) .... Take 1 by mouth qd 4)  Diovan 160 Mg Tabs (Valsartan) .Marland Kitchen.. 1 by mouth qd 5)  Vitamin D3 1000 Unit Tabs (Cholecalciferol) .Marland Kitchen.. 1 by mouth daily  Patient Instructions: 1)  Please schedule a follow-up appointment in 3 months. Prescriptions: XANAX 1 MG  TABS (ALPRAZOLAM) 1 by mouth two times a day prn  #60 x 1   Entered and Authorized by:   Tresa Garter MD   Signed by:   Tresa Garter MD on 03/12/2009   Method used:   Print then Give to Patient   RxID:   [id number] DIOVAN 160 MG TABS (VALSARTAN) 1 by mouth qd  #30 x 12   Entered and Authorized by:   Tresa Garter MD   Signed by:   Tresa Garter MD on 03/12/2009   Method used:   Print then Give to Patient   RxID:    [id number]

## 2010-03-25 ENCOUNTER — Ambulatory Visit: Payer: BLUE CROSS/BLUE SHIELD | Attending: Medical Oncology

## 2010-03-25 ENCOUNTER — Other Ambulatory Visit: Payer: Self-pay | Admitting: Medical Oncology

## 2010-03-25 ENCOUNTER — Encounter (INDEPENDENT_AMBULATORY_CARE_PROVIDER_SITE_OTHER): Payer: Self-pay | Admitting: Internal Medicine

## 2010-03-25 DIAGNOSIS — Z853 Personal history of malignant neoplasm of breast: Secondary | ICD-10-CM

## 2010-03-25 LAB — MAMMOGRAM, BOTH BREASTS DIAGNOSTIC

## 2010-03-25 LAB — SCREENING MAMMOGRAPHY BILATERAL

## 2010-04-26 ENCOUNTER — Ambulatory Visit (INDEPENDENT_AMBULATORY_CARE_PROVIDER_SITE_OTHER): Payer: BLUE CROSS/BLUE SHIELD | Admitting: Internal Medicine

## 2010-04-26 DIAGNOSIS — M79609 Pain in unspecified limb: Secondary | ICD-10-CM

## 2010-04-26 NOTE — Patient Instructions (Addendum)
Lotrimin at the corners of the mouth    Vasoline over the lip area.     Allegra    Change back to the old toothpaste.

## 2010-04-26 NOTE — Progress Notes (Signed)
60 year old   Chief Complaint   Patient presents with   . Mouth/Lip Problem     blisters inside lips   . 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  She complains of cracking at the corners of her mouth over the past month.    As well she has noted a blistering on both the outer lips and the inner aspect. In the past month.  She notes that she uses a lot of tabasco sauce.  In the past month she's been using some new lipsticks that she gets in [place] when she is flying there She has applied these both on the outer lips but also inside the lip against the teeth line--basically in the same area she is comlaining of symptoms.  At one time the lip became quite swollen and it reminded her of botox she has seen.   She has gotten into using lipsticks as a new thing lately.  Her lipts feel tight.   She also about this same time changed her toothpaste.     She also cmplains of left lower leg pain.  Doesn't feel muscular.  ? Nerve  Started playing pickle ball 8 months ago.  Happens if gets up from sitting or laying down.  A shooting pain in the posterior lower calf.  Goes away after five or six steps.  Has started getting better.     Exam  BP 118/75  Pulse 77  Temp(Src) 97.4 F (36.3 C) (Temporal)  SpO2 99%   Mouth:  There is cracking and eczemoutous like changes at the corners of the mouth that has appearance of yeast.  The lips externally look dry but otherwise normal.  There is a nonspecific appearance inner lip mucosa that is almost urticarial in appearance.  No ulcerations.     Lower legs: no edema.  Nontender.  Normal sensation    Assessment:  Encounter Diagnoses   Name Primary?   Marland Kitchen ORAL EPITHELIUM DISTURB NEC Yes   . PAIN IN LIMB        Plans:  Stop use of all the lipsticks.  May use vasoline on lip  Use  Lotrimin at the corners of mouth  Allegra  Return to former toothpaste  Report if without relief  The leg shows no pathology on exam.  Stretches . REport if  worsening or change.

## 2010-05-08 LAB — POCT CARDIAC MARKERS
CKMB, poc: <1 ng/mL — ABNORMAL LOW (ref 1.0–8.0)
Myoglobin, poc: 42.1 ng/mL (ref 12–200)
Troponin i, poc: <0.05 ng/mL (ref 0.00–0.09)

## 2010-05-08 LAB — POCT I-STAT, CHEM 8
Chloride: 110 mEq/L (ref 96–112)
Glucose, Bld: 138 mg/dL — ABNORMAL HIGH (ref 70–99)
HCT: 41 % (ref 36.0–46.0)
Potassium: 3.5 mEq/L (ref 3.5–5.1)
Sodium: 142 mEq/L (ref 135–145)

## 2010-05-08 LAB — CARDIAC PANEL(CRET KIN+CKTOT+MB+TROPI)
CK, MB: 0.6 ng/mL (ref 0.3–4.0)
Relative Index: INVALID (ref 0.0–2.5)
Total CK: 32 U/L (ref 7–177)
Troponin I: 0.03 ng/mL (ref 0.00–0.06)

## 2010-05-08 LAB — TSH: TSH: 1.062 u[IU]/mL (ref 0.350–4.500)

## 2010-05-19 ENCOUNTER — Other Ambulatory Visit: Payer: Self-pay | Admitting: Internal Medicine

## 2010-05-19 ENCOUNTER — Other Ambulatory Visit (INDEPENDENT_AMBULATORY_CARE_PROVIDER_SITE_OTHER): Payer: 59

## 2010-05-19 ENCOUNTER — Other Ambulatory Visit (INDEPENDENT_AMBULATORY_CARE_PROVIDER_SITE_OTHER): Payer: 59 | Admitting: Internal Medicine

## 2010-05-19 DIAGNOSIS — Z0000 Encounter for general adult medical examination without abnormal findings: Secondary | ICD-10-CM

## 2010-05-19 LAB — CBC WITH DIFFERENTIAL/PLATELET
Basophils Absolute: 0 10*3/uL (ref 0.0–0.1)
Eosinophils Relative: 5 % (ref 0.0–5.0)
Lymphs Abs: 2 10*3/uL (ref 0.7–4.0)
MCV: 86.2 fl (ref 78.0–100.0)
Monocytes Absolute: 0.4 10*3/uL (ref 0.1–1.0)
Monocytes Relative: 5.7 % (ref 3.0–12.0)
Neutrophils Relative %: 55.9 % (ref 43.0–77.0)
Platelets: 303 10*3/uL (ref 150.0–400.0)
RDW: 13.2 % (ref 11.5–14.6)
WBC: 6.2 10*3/uL (ref 4.5–10.5)

## 2010-05-19 LAB — BASIC METABOLIC PANEL
Calcium: 10 mg/dL (ref 8.4–10.5)
Creatinine, Ser: 0.7 mg/dL (ref 0.4–1.2)
GFR: 92.21 mL/min (ref >60.00)
Sodium: 142 mEq/L (ref 135–145)

## 2010-05-19 LAB — URINALYSIS
Bilirubin Urine: NEGATIVE
Leukocytes, UA: NEGATIVE
Nitrite: NEGATIVE
Total Protein, Urine: NEGATIVE

## 2010-05-19 LAB — TSH: TSH: 2.8 u[IU]/mL (ref 0.35–5.50)

## 2010-05-19 LAB — LIPID PANEL
HDL: 40.4 mg/dL (ref >39.00)
Triglycerides: 174 mg/dL — ABNORMAL HIGH (ref 0.0–149.0)

## 2010-05-19 LAB — HEPATIC FUNCTION PANEL
ALT: 26 U/L (ref 0–35)
AST: 26 U/L (ref 0–37)
Total Protein: 6.8 g/dL (ref 6.0–8.3)

## 2010-05-19 LAB — LDL CHOLESTEROL, DIRECT: Direct LDL: 212.1 mg/dL

## 2010-05-24 ENCOUNTER — Encounter: Payer: Self-pay | Admitting: Internal Medicine

## 2010-05-24 ENCOUNTER — Ambulatory Visit (INDEPENDENT_AMBULATORY_CARE_PROVIDER_SITE_OTHER): Payer: 59 | Admitting: Internal Medicine

## 2010-05-24 VITALS — BP 138/88 | HR 76 | Temp 98.4°F | Resp 16 | Ht 70.0 in | Wt 196.0 lb

## 2010-05-24 DIAGNOSIS — I10 Essential (primary) hypertension: Secondary | ICD-10-CM

## 2010-05-24 DIAGNOSIS — Z0000 Encounter for general adult medical examination without abnormal findings: Secondary | ICD-10-CM

## 2010-05-24 DIAGNOSIS — L659 Nonscarring hair loss, unspecified: Secondary | ICD-10-CM

## 2010-05-24 DIAGNOSIS — E785 Hyperlipidemia, unspecified: Secondary | ICD-10-CM

## 2010-05-24 DIAGNOSIS — R739 Hyperglycemia, unspecified: Secondary | ICD-10-CM

## 2010-05-24 DIAGNOSIS — R7309 Other abnormal glucose: Secondary | ICD-10-CM

## 2010-05-24 DIAGNOSIS — K219 Gastro-esophageal reflux disease without esophagitis: Secondary | ICD-10-CM

## 2010-05-24 MED ORDER — ALPRAZOLAM 1 MG PO TABS: 1.0000 mg | ORAL_TABLET | Freq: Two times a day (BID) | ORAL | Status: DC | PRN

## 2010-05-24 MED ORDER — COLESEVELAM HCL 625 MG PO TABS: 1875.0000 mg | ORAL_TABLET | Freq: Two times a day (BID) | ORAL | Status: DC

## 2010-05-24 MED ORDER — OMEPRAZOLE 10 MG PO CPDR: 10.0000 mg | DELAYED_RELEASE_CAPSULE | Freq: Every day | ORAL | Status: DC

## 2010-05-24 MED ORDER — LOSARTAN POTASSIUM 100 MG PO TABS: 100.0000 mg | ORAL_TABLET | Freq: Every day | ORAL | Status: DC

## 2010-05-24 NOTE — Assessment & Plan Note (Signed)
Better - she had seen a Derm 2 wks ago - Rogain

## 2010-05-24 NOTE — Assessment & Plan Note (Signed)
We changed meds due to her concern with alopecia

## 2010-05-24 NOTE — Patient Instructions (Signed)
Labs prior

## 2010-05-24 NOTE — Progress Notes (Signed)
Subjective:    Patient ID: Catherine Mora, female    DOB: 05-08-1950, 60 y.o.   MRN: [id number]  HPI The patient is here for a wellness exam. The patient has been doing well overall without major physical or psychological issues going on lately. The patient needs to address her chronic hypertension that has been well controlled with medicines; to address chronic hair loss - better. F/u elevated glucose. She started HRT 1 year ago with "natural hormones"  Review of Systems  Constitutional: Negative.  Negative for fever, chills, diaphoresis, activity change, appetite change, fatigue and unexpected weight change.  HENT: Negative for hearing loss, ear pain, nosebleeds, congestion, sore throat, facial swelling, rhinorrhea, sneezing, mouth sores, trouble swallowing, neck pain, neck stiffness, postnasal drip, sinus pressure and tinnitus.   Eyes: Negative for pain, discharge, redness, itching and visual disturbance.  Respiratory: Negative for cough, chest tightness, shortness of breath, wheezing and stridor.   Cardiovascular: Negative for chest pain, palpitations and leg swelling.  Gastrointestinal: Negative for nausea, diarrhea, constipation, blood in stool, abdominal distention, anal bleeding and rectal pain.  Genitourinary: Negative for dysuria, urgency, frequency, hematuria, flank pain, vaginal bleeding, vaginal discharge, difficulty urinating, genital sores and pelvic pain.  Musculoskeletal: Negative for back pain, joint swelling, arthralgias and gait problem.  Skin: Negative.  Negative for rash.  Neurological: Negative for dizziness, tremors, seizures, syncope, speech difficulty, weakness, numbness and headaches.  Hematological: Negative for adenopathy. Does not bruise/bleed easily.  Psychiatric/Behavioral: Negative for suicidal ideas, behavioral problems, sleep disturbance, dysphoric mood and decreased concentration. The patient is not nervous/anxious.        Objective:   Physical Exam    Constitutional: She appears well-developed and well-nourished. No distress.  HENT:  Head: Normocephalic.  Right Ear: External ear normal.  Left Ear: External ear normal.  Nose: Nose normal.  Mouth/Throat: Oropharynx is clear and moist.  Eyes: Conjunctivae are normal. Pupils are equal, round, and reactive to light. Right eye exhibits no discharge. Left eye exhibits no discharge.  Neck: Normal range of motion. Neck supple. No JVD present. No tracheal deviation present. No thyromegaly present.  Cardiovascular: Normal rate, regular rhythm and normal heart sounds.   Pulmonary/Chest: No stridor. No respiratory distress. She has no wheezes.  Abdominal: Soft. Bowel sounds are normal. She exhibits no distension and no mass. There is no tenderness. There is no rebound and no guarding.  Musculoskeletal: She exhibits no edema and no tenderness.  Lymphadenopathy:    She has no cervical adenopathy.  Neurological: She displays normal reflexes. No cranial nerve deficit. She exhibits normal muscle tone. Coordination normal.  Skin: No rash noted. No erythema.  Psychiatric: She has a normal mood and affect. Her behavior is normal. Judgment and thought content normal.        Lab Results  Component Value Date   WBC 6.2 05/19/2010   HGB 14.0 05/19/2010   HCT 40.7 05/19/2010   PLT 303.0 05/19/2010   CHOL 281* 05/19/2010   TRIG 174.0* 05/19/2010   HDL 40.40 05/19/2010   LDLDIRECT 212.1 05/19/2010   ALT 26 05/19/2010   AST 26 05/19/2010   NA 142 05/19/2010   K 4.4 05/19/2010   CL 107 05/19/2010   CREATININE 0.7 05/19/2010   BUN 14 05/19/2010   CO2 30 05/19/2010   TSH 2.80 05/19/2010   HGBA1C 6.7* 03/15/2009     Assessment & Plan:  Well adult exam GYN with Dr Rana Snare q 12 months; she had a Mammogram Colon due in a few years  Labs reviewed She has been on Progesteron, Estr and Testosterone  "bioidentical hormones" x 12 months. We discussed this consept - I expressed my opinion and concerns. She declined vaccine  shots  HYPERTENSION We changed meds due to her concern with alopecia  HAIR LOSS Better - she had seen a Derm 2 wks ago - Rogain  GERD On Prilosec  Dyslipidemia -- worse  Worse due to HRT I think.   Hyperglycemia  Worse. Check A1c. Welchol should help.Marland KitchenMarland Kitchen

## 2010-05-24 NOTE — Assessment & Plan Note (Signed)
GYN with Dr Rana Snare q 12 months; she had a Mammogram Colon due in a few years Labs reviewed

## 2010-05-24 NOTE — Assessment & Plan Note (Signed)
On Prilosec

## 2010-07-08 NOTE — Op Note (Signed)
NAMEJOLEY, UTECHT              ACCOUNT NO.:  [id number]   MEDICAL RECORD NO.:  [id number]          PATIENT TYPE:  AMB   LOCATION:  [facility]                          FACILITY:  [facility]   PHYSICIAN:  Currie Paris, M.D.DATE OF BIRTH:  02/19/1951   DATE OF PROCEDURE:  03/09/2004  DATE OF DISCHARGE:                                 OPERATIVE REPORT   PREOPERATIVE DIAGNOSIS:  Pilar cyst occipital area of scalp.   POSTOPERATIVE DIAGNOSIS:  Pilar cyst occipital area of scalp.   OPERATION:  Excision of pilar cyst.   SURGEON:  Currie Paris, M.D.   ANESTHESIA:  Local.   CLINICAL HISTORY:  This patient has a cystic mass on the posterior scalp  that she wished to have excised.   DESCRIPTION OF PROCEDURE:  In the minor procedure room, the area in question  was identified by the patient and the hair over this clipped.  It was  anesthetized with 1% Xylocaine with epinephrine.  I waited about 10 minutes  and then came back, and after a Betadine prep I did an elliptical incision  and sharply excised the subcutaneous mass which grossly appeared to be a  pilar cyst.   The area appeared dry and was closed with 3-0 Monocryl and Dermabond for the  skin.   The patient tolerated the procedure well.      Chri   CJS/MEDQ  D:  03/09/2004  T:  03/09/2004  Job:  [id number]

## 2010-07-23 ENCOUNTER — Other Ambulatory Visit: Payer: Self-pay | Admitting: Internal Medicine

## 2010-07-25 NOTE — Telephone Encounter (Signed)
Ok to Rf? Med is not on med list.

## 2010-08-10 ENCOUNTER — Other Ambulatory Visit: Payer: Self-pay | Admitting: *Deleted

## 2010-08-10 MED ORDER — VALSARTAN 160 MG PO TABS: 160.0000 mg | ORAL_TABLET | Freq: Every day | ORAL | Status: DC

## 2010-08-29 ENCOUNTER — Telehealth (INDEPENDENT_AMBULATORY_CARE_PROVIDER_SITE_OTHER): Payer: Self-pay | Admitting: Geriatric Medicine

## 2010-08-29 DIAGNOSIS — Z131 Encounter for screening for diabetes mellitus: Secondary | ICD-10-CM

## 2010-08-29 DIAGNOSIS — Z1329 Encounter for screening for other suspected endocrine disorder: Secondary | ICD-10-CM

## 2010-08-29 DIAGNOSIS — Z130 Encounter for screening for diseases of the blood and blood-forming organs and certain disorders involving the immune mechanism: Secondary | ICD-10-CM

## 2010-08-29 DIAGNOSIS — E785 Hyperlipidemia, unspecified: Secondary | ICD-10-CM

## 2010-08-29 NOTE — Telephone Encounter (Signed)
Pt would like to get fasting labs prior to physical.

## 2010-08-29 NOTE — Telephone Encounter (Signed)
Ordered

## 2010-08-29 NOTE — Telephone Encounter (Signed)
Called patient LM notifying her orders placed and to call back and schedule a lab appointment.

## 2010-09-01 ENCOUNTER — Encounter (INDEPENDENT_AMBULATORY_CARE_PROVIDER_SITE_OTHER): Payer: Self-pay | Admitting: Geriatric Medicine

## 2010-09-01 ENCOUNTER — Other Ambulatory Visit (INDEPENDENT_AMBULATORY_CARE_PROVIDER_SITE_OTHER): Payer: BLUE CROSS/BLUE SHIELD

## 2010-09-01 ENCOUNTER — Ambulatory Visit (INDEPENDENT_AMBULATORY_CARE_PROVIDER_SITE_OTHER): Payer: BLUE CROSS/BLUE SHIELD | Admitting: Geriatric Medicine

## 2010-09-01 VITALS — BP 105/70 | HR 87 | Temp 98.6°F | Ht 67.5 in | Wt 114.0 lb

## 2010-09-01 DIAGNOSIS — Z1329 Encounter for screening for other suspected endocrine disorder: Secondary | ICD-10-CM

## 2010-09-01 DIAGNOSIS — H1045 Other chronic allergic conjunctivitis: Secondary | ICD-10-CM

## 2010-09-01 DIAGNOSIS — Z131 Encounter for screening for diabetes mellitus: Secondary | ICD-10-CM

## 2010-09-01 DIAGNOSIS — Z0000 Encounter for general adult medical examination without abnormal findings: Secondary | ICD-10-CM

## 2010-09-01 DIAGNOSIS — Z122 Encounter for screening for malignant neoplasm of respiratory organs: Secondary | ICD-10-CM

## 2010-09-01 DIAGNOSIS — Z130 Encounter for screening for diseases of the blood and blood-forming organs and certain disorders involving the immune mechanism: Secondary | ICD-10-CM

## 2010-09-01 DIAGNOSIS — F411 Generalized anxiety disorder: Secondary | ICD-10-CM

## 2010-09-01 DIAGNOSIS — E785 Hyperlipidemia, unspecified: Secondary | ICD-10-CM

## 2010-09-01 LAB — LIPID PANEL
Cholesterol (LDL): 143 mg/dL — ABNORMAL HIGH (ref <130)
Cholesterol/HDL Ratio: 2.6
HDL Cholesterol: 95 mg/dL (ref >40)
Non-HDL Cholesterol: 150 mg/dL (ref 0–159)
Total Cholesterol: 245 mg/dL — ABNORMAL HIGH (ref <200)
Triglyceride: 33 mg/dL (ref <150)

## 2010-09-01 LAB — COMPREHENSIVE METABOLIC PANEL
ALT (GPT): 18 U/L (ref 6–40)
AST (GOT): 22 U/L (ref 15–40)
Albumin: 3.9 g/dL (ref 3.5–5.2)
Alkaline Phosphatase (Total): 53 U/L (ref 31–132)
Anion Gap: 7 (ref 3–11)
Bilirubin (Total): 0.6 mg/dL (ref 0.2–1.3)
Calcium: 9.7 mg/dL (ref 8.9–10.2)
Carbon Dioxide, Total: 29 mEq/L (ref 22–32)
Chloride: 103 mEq/L (ref 98–108)
Creatinine: 0.75 mg/dL (ref 0.38–1.02)
GFR, Calc, African American: >60 mL/min (ref >59)
GFR, Calc, European American: >60 mL/min (ref >59)
Glucose: 90 mg/dL (ref 62–125)
Potassium: 4.6 mEq/L (ref 3.7–5.2)
Protein (Total): 5.7 g/dL — ABNORMAL LOW (ref 6.0–8.2)
Sodium: 139 mEq/L (ref 136–145)
Urea Nitrogen: 20 mg/dL (ref 8–21)

## 2010-09-01 LAB — CBC (HEMOGRAM)
Hematocrit: 39 % (ref 36–45)
Hemoglobin: 13.2 g/dL (ref 11.5–15.5)
MCH: 32.9 pg (ref 27.3–33.6)
MCHC: 33.7 g/dL (ref 32.2–36.5)
MCV: 98 fL (ref 81–98)
Platelet Count: 226 10*3/uL (ref 150–400)
RBC: 4.01 mil/uL (ref 3.80–5.00)
RDW-CV: 12.5 % (ref 11.6–14.4)
WBC: 4.14 10*3/uL — ABNORMAL LOW (ref 4.3–10.0)

## 2010-09-01 LAB — THYROID STIMULATING HORMONE: Thyroid Stimulating Hormone: 2.752 u[IU]/mL (ref 0.400–5.000)

## 2010-09-01 MED ORDER — IMIPRAMINE HCL 25 MG OR TABS
ORAL_TABLET | ORAL | Status: DC
Start: 2010-09-01 — End: 2011-02-03

## 2010-09-01 MED ORDER — OLOPATADINE HCL 0.1 % OP SOLN
OPHTHALMIC | Status: DC
Start: 2010-09-01 — End: 2013-11-10

## 2010-09-01 NOTE — Progress Notes (Signed)
Vitals, medication, and allergies verified by Karla Barron, MA

## 2010-09-01 NOTE — Progress Notes (Signed)
Addended by[employer] on: 09/01/2010 09:31 AM     Modules accepted: Orders

## 2010-09-01 NOTE — Patient Instructions (Addendum)
VACCINES  --check on insurance coverage of shingles vaccine  --get the polio vaccine from work clinic    ----------------------  It was a pleasure to see you in clinic today. Your Medical Assistant was: Ulanda Edison can schedule an appointment to see [place] by calling [phone] or via eCare.     If labs were ordered today the results are expected to be available via eCare 5 days later. Otherwise, result letters are mailed 7-10 days after your tests are completed. If your physician needs to change your care based on your results, you will receive a phone call to notify you. If you haven't heard from him/her and it has been more than 10 days please give [place] a call.     Thank you for choosing [facility].

## 2010-09-01 NOTE — Progress Notes (Signed)
Paula Bowman is a 60 year old female here today for a preventive health visit. Other problems or concerns today: she is concerned because her joints hurt: upper arms    GYN HISTORY  Obstetric History    G0   P0   T0   P0   A0   TAB0   SAB0   E0   M0   L0      No LMP recorded. Patient has had a hysterectomy.   Menses: none  Menopausal symptoms: none  Incontinence: No  Last pap: 2011  Pap history: all normal; h/o hysterectomy in the past for fibroids  Contraception: none  History of HRT: yes, tamoxifen  Other gyn history: h/o breast CA as noted below    SEXUAL HISTORY  Sexual activity: with her current female partner 61yrs ago  HIV risk: Denies, multiple sexual partners, history of a sexually transmitted disease, transfusion between 68 and 1985 and use of illicit drugs by injection  Sexual concerns: No  History of sexual or physical abuse: No  Have you been hit, kicked, punched, or otherwise hurt by someone within the past year: No    CANCER SCREENING  Family history of colon cancer: NO  Family history of uterine or ovarian cancer: NO  Family history of breast cancer: h/o breast cancer--she is a survivor  Prior mammogram:  She is UTD on these; 2/12    LIFESTYLE  Current dietary habits: eating healthy; she does her own cooking  Calcium: dietary sources only  Current exercise habits: exercising frequently: walking 5 days/wk 2 miles daily  Regular seat belt use: YES  Substance use:  reports that she quit smoking about 22 years ago. She does not have any smokeless tobacco history on file. She reports that she drinks about .5 ounces of alcohol per week. She reports that she does not use illicit drugs.   Exposure to hazardous materials: No  Guns in the house: No  Stress: recent move in the last few months    History sections of chart reviewed and updated today: YES    Review Of Systems:  Constitutional: Denies, fatigue, unexpected weight loss, unexpected weight gain  Regular dental exams: yes  Eye: Denies, blurry  vision, diplopia, eye pain  ENT: Denies, hearing difficulties, nasal congestion and any ear nose or throat problems  Respiratory: Denies, dyspnea on exertion, dyspnea at rest, cough, wheezing  Cardiovascular: Denies, chest pain or pressure at rest or during exercise, palpitations  GI: Denies, change in appetite, dysphagia, nausea, vomiting, dyspepsia, abdominal pain, diarrhea, constipation, hematochezia, melena  GU: Denies, menstrual problems including irregular, heavy or painful menses, prior abnormal pap smear, dysuria, hematuria, urinary incontinence, urinary urgency, vaginal discharge, abnormal vaginal bleeding, pelvic pain  Derm: Denies, skin rash, easy bruising, hair loss, changes in moles or new moles  Rheumatologic/Musculoskeletal: Denies, joint swelling, joint stiffness, any joint or arthritic problems  Endocrine: Denies, polyuria, polydipsia, heat intolerance, cold intolerance, prior blood sugar problems, prior thyroid problems  Neurologic: Denies, headaches and tremors  Psych: Denies, depressed mood, sleep disturbance and anxiety; increased stress from recent move  MSK: aching in her R arm and her L elbow; recent move    EXAM:  General: healthy, alert, no distress  Skin: Skin color, texture, turgor normal. No rashes or concerning lesions  Head: Normocephalic. No masses, lesions, tenderness or abnormalities  Eyes: Lids/periorbital skin normal, Conjunctivae/corneas clear, PERRL, EOM's intact  Neck: supple. No adenopathy. Thyroid symmetric, normal size, without nodules  Lungs: clear to  auscultation  Heart: normal rate, regular rhythm and no murmurs, clicks, or gallops  Breasts: +lumpectomy scar; No obvious deformity or mass to inspection, nipples everted bilaterally, no skin lesion or nipple discharge, no mass palpated, no axillary lymphadenopathy  Abd: soft, non-tender. BS normal. No masses or organomegaly  GU: deferred  Rectal: deferred  Extremities: Normal, without deformities, edema, or skin  discoloration, radial and DP pulses 2+ bilaterally  Neuro:  Grossly normal to observation, gait normal      DATA:  Orders Only on 09/01/2010   Component Date Value   . SODIUM 09/01/2010 139    . POTASSIUM 09/01/2010 4.6    . CHLORIDE 09/01/2010 103    . CARBON DIOXIDE 09/01/2010 29    . ION GAP 09/01/2010 7    . GLUCOSE 09/01/2010 90    . UREA NITROGEN 09/01/2010 20    . CREATININE 09/01/2010 0.75    . PROTEIN (TOTAL) 09/01/2010 5.7*   . ALBUMIN 09/01/2010 3.9    . BILIRUBIN (TOTAL) 09/01/2010 0.6    . CALCIUM 09/01/2010 9.7    . AST (GOT) 09/01/2010 22    . ALKALINE PHOSPHATASE (TO* 09/01/2010 53    . ALT (GPT) 09/01/2010 18    . GFR, Calc, European Amer* 09/01/2010 >60    . GFR, Calc, African Ameri* 09/01/2010 >60    . GFR, Information 09/01/2010                      Value:Calculated GFR in mL/min/1.73 m2 by MDRD equation.                          Fairly accurate in outpatients in chronic kidney                          disease. May underestimate in others. Inaccurate with                          changing renal function. See                          http://depts.ThisTune.it.html   . WBC 09/01/2010 4.14*   . RBC 09/01/2010 4.01    . HEMOGLOBIN 09/01/2010 13.2    . HEMATOCRIT 09/01/2010 39    . MCV 09/01/2010 98    . [facility] 09/01/2010 32.9    . MCHC 09/01/2010 33.7    . PLATELET COUNT 09/01/2010 226    . RDW-CV 09/01/2010 12.5    . THYROID STIMULATING HORM* 09/01/2010 2.752    . CHOLESTEROL (TOTAL) 09/01/2010 245*   . TRIGLYCERIDE 09/01/2010 33    . CHOLESTEROL (HDL) 09/01/2010 95    . CHOLESTEROL (LDL) 09/01/2010 143*   . Non-HDL Cholesterol 09/01/2010 150    . CHOLESTEROL/HDL RATIO 09/01/2010 2.6    . Lipid Panel, Additional * 09/01/2010                      Value:                          (NOTE)                          For complete information to help interpret the lipid  panel, please                           use the National Cholesterol Education Program (NCEP)                            guideline based                           reference range comments found here:                          http://web.labmed.TelephoneAid.tn   Orders Only on 03/25/2010   Component Date Value   . General Diagnostic Text 03/25/2010                      Value:Examination:                          A digital screening mammogram was performed and computer-aided                           detection was applied.                           The results and recommendations of today's exam were discussed with                           the patient.                                                      Comparison                          Comparison is made to images from 11/10/2009 (right) and outside                           images from 04/08/2009 (bilateral) and outside images from 10/28/2008                           (bilateral) and outside images from 04/22/2008 (bilateral) and                           outside images from 10/30/2007 (bilateral).                                                       Findings                          Right Breast Findings:                           There are scattered fibroglandular densities (25% - 50%  fibroglandular).  The patient has had a previous lumpectomy.  No                           significant masses, calcifications or other abnormalities are seen.                                                                                  Left Breast Findings:                           There are scattered fibroglandular densities (25% - 50%                           fibroglandular).  No significant masses, calcifications or other                           abnormalities are seen.                                                                                 Impression                          RIGHT BREAST: S/P lumpectomy. Benign, no evidence of malignancy.                           Normal interval follow-up is recommended in  6 months.                                                        LEFT BREAST: Negative, no evidence of malignancy. Normal interval                           follow-up is recommended in 12 months.                                                        OVERALL ASSESSMENT - CATEGORY 2 - BENIGN                                                      ATTENDING RADIOLOGIST AND PAGER NUMBER  [id number] DeMartini Dorian Pod. MD                          [phone]   . General Diagnostic Text 03/25/2010                      Value:Examination:                          A bilateral diagnostic digital mammogram was performed and                           computer-aided detection was applied.                           The results and recommendations of today's exam were discussed with                           the patient.                                                      Comparison                          Comparison is made to images from 11/10/2009 (right) and outside                           images from 04/08/2009 (bilateral) and outside images from 10/28/2008                           (bilateral) and outside images from 04/22/2008 (bilateral) and                           outside images from 10/30/2007 (bilateral).                                                       Findings                          Right Breast Findings:                           There are scattered fibroglandular densities (25% - 50%                           fibroglandular).  The patient has had a previous lumpectomy.  No                           significant masses, calcifications or other abnormalities are seen.  Left Breast Findings:                           There are scattered fibroglandular densities (25% - 50%                           fibroglandular).  No significant masses, calcifications or other                           abnormalities are seen.                                                                                  Impression                          RIGHT BREAST: S/P lumpectomy. Benign, no evidence of malignancy.                           Normal interval follow-up is recommended in 6 months.                                                        LEFT BREAST: Negative, no evidence of malignancy. Normal interval                           follow-up is recommended in 12 months.                                                        OVERALL ASSESSMENT - CATEGORY 2 - BENIGN                                                      ATTENDING RADIOLOGIST AND PAGER NUMBER                          [id number] DeMartini Toniann Fail B. MD                          [phone]           ASSESSMENT AND PLAN:    Health Maintenance   Topic Date Due   . Tetanus Booster  04/09/1962   . Colon Cancer Screening,fobt  04/09/2000   . Zoster Vaccine  04/09/2010   . Mammogram Q70mos  09/23/2010   . Influenza Vaccine  11/21/2010   . Cholesterol-female  08/29/2015     Immunizations due: she believes she is utd on tetanus  Check insurance coverage of shingles vaccine    Screening tests discussed: SHE IS utd on colonoscopy as of approx 5 yrs ago  History sections of chart reviewed and updated today: YES    WELL EXAM  --fasting labs wnl as noted above  --no more paps as hysterectomy was for benign issue    --lung cancer screening given her smoking hx: referral  --colonoscopy UTD, mammogram UTD  --MSK issues: likely sore muscles and known tennis elbow; return prn if persists    The following were reviewed with patient:  Breast exam, Moderation in EtOH use, avoid drinking and driving, Importance of regular dental care , Low-fat, high-fiber diet, Importance of regular exercise and Safe storage of firearms  Patient information given for the following: none    Meds: refilled for anxiety and allergies; stable on these    Followup: as needed  Lab tests and results of any diagnostic studies will be shared  with the patient by: IN PERSON TODAY

## 2010-09-02 ENCOUNTER — Encounter (INDEPENDENT_AMBULATORY_CARE_PROVIDER_SITE_OTHER): Payer: Self-pay | Admitting: Geriatric Medicine

## 2010-09-02 DIAGNOSIS — H1045 Other chronic allergic conjunctivitis: Secondary | ICD-10-CM | POA: Insufficient documentation

## 2010-09-02 DIAGNOSIS — F411 Generalized anxiety disorder: Secondary | ICD-10-CM | POA: Insufficient documentation

## 2010-09-26 ENCOUNTER — Telehealth: Payer: Self-pay | Admitting: *Deleted

## 2010-09-26 NOTE — Telephone Encounter (Signed)
OK to fill this prescription with additional refills x2 Thank you!  

## 2010-09-26 NOTE — Telephone Encounter (Signed)
Rf req for Alprazolam 1mg  1 po bid prn # 60. Ok to Rf?

## 2010-09-27 ENCOUNTER — Ambulatory Visit: Payer: PRIVATE HEALTH INSURANCE | Admitting: Internal Medicine

## 2010-09-27 MED ORDER — ALPRAZOLAM 1 MG PO TABS: 1.0000 mg | ORAL_TABLET | Freq: Two times a day (BID) | ORAL | Status: DC | PRN

## 2010-10-07 ENCOUNTER — Other Ambulatory Visit (INDEPENDENT_AMBULATORY_CARE_PROVIDER_SITE_OTHER): Payer: Self-pay | Admitting: Internal Medicine

## 2010-10-08 MED ORDER — NEXIUM 40 MG OR CPDR
DELAYED_RELEASE_CAPSULE | ORAL | Status: DC
Start: 2010-10-07 — End: 2011-09-01

## 2010-10-08 NOTE — Telephone Encounter (Signed)
Pt last seen 08/22/10 last filled 09/02/09, rx faxed

## 2010-10-11 ENCOUNTER — Other Ambulatory Visit (HOSPITAL_BASED_OUTPATIENT_CLINIC_OR_DEPARTMENT_OTHER): Payer: BLUE CROSS/BLUE SHIELD

## 2010-10-19 ENCOUNTER — Encounter (INDEPENDENT_AMBULATORY_CARE_PROVIDER_SITE_OTHER): Payer: Self-pay | Admitting: Internal Medicine

## 2010-10-19 NOTE — Telephone Encounter (Signed)
Pt needs correction on diagnosis associated with a blood draw that was done on 09/01/10. Pt is not sure what was coded incorrectly. Pt dropped of her copy of lab bill and copy of pt's ins coding. In billing inbox.

## 2010-10-20 NOTE — Telephone Encounter (Signed)
Patient called back and said her insurance told her we need to change codes but did not tel her to what.     Advised I would try to call but most times the response I get is that they cannot advise me what codes to change to.     Tried [employer] 3 times. System is not user friendly and kept hanging up on me. On 4th I followed all prompts correctly and still hung up. I called in the 5th time as a "member" to get someone. Spoke with Raynelle Fanning, who took my information and then transferred me to a line to hold for someone.    Spoke with Noreene Larsson for about 15 minutes trying to find policy on non-coverage. Could not find it. Transferred me to [facility] in claims, spoke with her for about 10 minutes. She pulled up the physical allowance and what's covered. The labs we ordered are not covered labs on her plan with physical.     Called and advised Shamira. She feels this is unreasonable as labs need to be done at physical. She appreciated the time spent, she is going to also call them again and find out what has changed in the last year.

## 2010-10-20 NOTE — Telephone Encounter (Signed)
Called and left message to see how to best help her with request. All labs are coded as screening. I am at [address] currently and will be heading to the [facility]. Available until 2.    Called lab billing to see why insurance didn't cover. She will call me back.    Paula Bowman called back, stated insurance advised these were not covered under patients plan. If patient has questions she should call her insurance.

## 2010-10-31 ENCOUNTER — Other Ambulatory Visit: Payer: Self-pay

## 2010-12-01 ENCOUNTER — Ambulatory Visit: Payer: BLUE CROSS/BLUE SHIELD | Attending: Medical Oncology

## 2010-12-01 ENCOUNTER — Other Ambulatory Visit: Payer: Self-pay | Admitting: Medical Oncology

## 2010-12-01 DIAGNOSIS — M542 Cervicalgia: Secondary | ICD-10-CM

## 2010-12-01 DIAGNOSIS — Z853 Personal history of malignant neoplasm of breast: Secondary | ICD-10-CM | POA: Insufficient documentation

## 2010-12-06 LAB — MAMMOGRAM, RIGHT BREAST DIAGNOSTIC

## 2011-02-03 ENCOUNTER — Encounter (INDEPENDENT_AMBULATORY_CARE_PROVIDER_SITE_OTHER): Payer: Self-pay | Admitting: Nurse Practitioner

## 2011-02-03 ENCOUNTER — Ambulatory Visit (INDEPENDENT_AMBULATORY_CARE_PROVIDER_SITE_OTHER): Payer: BLUE CROSS/BLUE SHIELD | Admitting: Nurse Practitioner

## 2011-02-03 VITALS — BP 102/64 | HR 66 | Temp 97.4°F | Resp 12 | Wt 118.0 lb

## 2011-02-03 DIAGNOSIS — J069 Acute upper respiratory infection, unspecified: Secondary | ICD-10-CM

## 2011-02-03 MED ORDER — AZITHROMYCIN 250 MG OR TABS
ORAL_TABLET | ORAL | Status: AC
Start: 2011-02-03 — End: 2011-02-08

## 2011-02-03 NOTE — Progress Notes (Signed)
Patient presents with   Chief Complaint   Patient presents with   . Sinus Problem     Sinus congestion coughing up phlem x 2 days       Past Medical History   Diagnosis Date   . Malignant neoplasm of breast (female), unspecified site 2008     lumpectomy: Stage 0; Radiation; was ERP; She did not complete Tamoxifen due to side effects x1 yr.    . Esophageal reflux         Past Surgical History   Procedure Date   . Vaginal hysterectomy 1994     Ovaries in place   . Knee, r knee at age 60, removed floating bone    . 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 Would like something to take if she needs it.   Can not use sudafed due to drug testing laws.     ROS:  + allergies   No tobacco   GERD takes nexium daily has tried to decrease this to every other day and she has break through symptoms.     OBJECTIVE:  BP 102/64  Pulse 66  Temp(Src) 97.4 F (36.3 C) (Temporal)  Resp 12  Wt 118 lb (53.524 kg)  SpO2 98%  General appearance: Alert, skin warm dry color good, afebrile, mild nasal congestion  Frontal sinuses tender to percussion  Ears: R TM - normal, L TM - normal  Nose: clear rhinorrhea,mucosal erythema,mucosal edema  Oropharynx: normal  Neck: supple,no adenopathy,thyroid normal size, non-tender,  without nodularity  Lungs clear  HRRR    ASSESSMENT/PLAN:   URI, acute  (primary encounter diagnosis)  Comment: enc. Fluids and decongestants that are not suda fed.   Continue flonase  Plan: Azithromycin (ZITHROMAX) 250 MG Oral Tab

## 2011-02-03 NOTE — Patient Instructions (Signed)
It was a pleasure to see you in clinic today. You were seen by Larhonda Clayville ARNP and your Medical Assistant name is Ed.     Before you leave the clinic today please stop by the front desk to schedule:    Follow up appointment with me in _____ days/weeks/months  Lab  X-Ray  Clinic Staff for:   Wendi Batinic RN for:  Sleep Consult with Dr Mandell  Nutrition Consult with Deborah Katz  Cardiology Consult  Dermatology Consult  [facility]  Sign Medical Records Release to request your last 3 years of records  Sign up for eCare access    If you have not already done so, you may wish to sign up for eCare. ECare provides secure on line access to your medical record and also allows you to communicate with your care team, view test results and schedule appointments. Our front desk staff will be happy to get you set up with an access code. You can then activate your account using the eCare Activation Station in the lobby.         You can schedule an appointment to see us by calling [phone] or via eCare.     If labs were ordered today the results are expected to be available via eCare 5 days later. Otherwise, result letters are mailed 7-10 days after your tests are completed. If your physician needs to change your care based on your results, you will receive a phone call to notify you. If you haven't heard from him/her and it has been more than 10 days please give us a call.     We are committed to exceeding your expectations, please let us know how we can assist you.

## 2011-05-05 ENCOUNTER — Ambulatory Visit (INDEPENDENT_AMBULATORY_CARE_PROVIDER_SITE_OTHER): Payer: BLUE CROSS/BLUE SHIELD | Admitting: Geriatric Medicine

## 2011-05-05 ENCOUNTER — Encounter (INDEPENDENT_AMBULATORY_CARE_PROVIDER_SITE_OTHER): Payer: Self-pay | Admitting: Geriatric Medicine

## 2011-05-05 VITALS — BP 91/63 | HR 72 | Temp 98.4°F | Resp 16 | Wt 117.0 lb

## 2011-05-05 DIAGNOSIS — J069 Acute upper respiratory infection, unspecified: Secondary | ICD-10-CM

## 2011-05-05 MED ORDER — AZITHROMYCIN 250 MG OR TABS
ORAL_TABLET | ORAL | Status: AC
Start: 2011-05-05 — End: 2011-05-10

## 2011-05-05 MED ORDER — FLUTICASONE PROPIONATE 50 MCG/ACT NA SUSP
NASAL | Status: DC
Start: 2011-05-05 — End: 2011-09-01

## 2011-05-05 NOTE — Progress Notes (Signed)
Patient roomed by Sabby D MA  Accompanied by self .  Allergies , Medications and smoking hx verified and Vital taken for today's visit .

## 2011-05-05 NOTE — Patient Instructions (Addendum)
It was a pleasure to see you in clinic today. Your Medical Assistant was: Orland Jarred                 You can schedule an appointment to see [place] by calling [phone] or via eCare.     If labs were ordered today the results are expected to be available via eCare 5 days later. Otherwise, result letters are mailed 7-10 days after your tests are completed. If your physician needs to change your care based on your results, you will receive a phone call to notify you. If you haven't heard from him/her and it has been more than 10 days please give [place] a call.     Thank you for choosing [facility].         ASSESSMENT/PLAN:   URI, acute  (primary encounter diagnosis)  Comment: increase fluids; continue the OTC dayquil, ibuprofen for achiness     --can restart the flonase  Plan:this is most likely viral   However we discussed that if symptoms persist for >7-10 days with sinus pressure/green sputum can take the azithromycin   Azithromycin (ZITHROMAX) 250 MG Oral Tab

## 2011-05-05 NOTE — Progress Notes (Signed)
Patient presents with   Chief Complaint   Patient presents with   . Sinus Problem     pressure , yesllowish nasal discharge x 2 days    . URI     coughing up green mucus , no fever , taking Day quill       Past Medical History   Diagnosis Date   . Malignant neoplasm of breast (female), unspecified site 2008     lumpectomy: Stage 0; Radiation; was ERP; She did not complete Tamoxifen due to side effects x1 yr.    . Esophageal reflux         Past Surgical History   Procedure Date   . Vaginal hysterectomy 1994     Ovaries in place   . Knee, r knee at age 43, removed floating bone    . Total abdom hysterectomy      for fibroids   . Colonoscopy 2006     [place] , FU 10 years      Current outpatient prescriptions:Fluticasone Propionate (FLONASE NA), 1 spray in each nostril as needed, Disp: , Rfl: ;  Hyoscyamine Sulfate (NULEV) 0.125 MG OR TBDP, None Entered, Disp: , Rfl: ;  NEXIUM 40 MG Oral CAPSULE DELAYED RELEASE, TAKE 1 CAPSULE DAILY, Disp: 90 Cap, Rfl: 2;  Olopatadine HCl (PATANOL) 0.1 % ophthalmic Solution, 1 gtts in each eye twice daily as needed, Disp: 3 Bottle, Rfl: 3    HPI:  Paula Bowman is a 61 year old female with: getting a URI with sinus pressure and nasal congestion.   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     Has been doing OTC dayquil and advil  Drinking fluids but not too much  Has not used flonase this time    ROS:   Achy  No cough or fever  + allergies   No tobacco (ex smoker)      OBJECTIVE:  BP 91/63  Pulse 72  Temp(Src) 98.4 F (36.9 C) (Temporal)  Resp 16  Wt 117 lb (53.071 kg)  SpO2 100%  GEN: in no apparent distress  Eyes: conjunctivae wnl  Ears: TMs wnl  Nose: no mucosal edema  Oropharynx: clear with some postnasal drip  Face:no sig sinus tenderness on palpation  Neck: supple, some shotty cervical LAD  Lungs: clear to auscultation b/l  Cardiac: regular rate and rhythm +s1/s2  Ext: warm and well  perfused      ASSESSMENT/PLAN:   URI, acute  (primary encounter diagnosis)  Comment: increase fluids; continue the OTC dayquil, ibuprofen for achiness     --can restart the flonase  Plan:this is most likely viral   However we discussed that if symptoms persist for >7-10 days with sinus pressure/green sputum can take the azithromycin   Azithromycin (ZITHROMAX) 250 MG Oral Tab

## 2011-05-12 ENCOUNTER — Other Ambulatory Visit (HOSPITAL_BASED_OUTPATIENT_CLINIC_OR_DEPARTMENT_OTHER): Payer: Self-pay

## 2011-05-12 ENCOUNTER — Ambulatory Visit (HOSPITAL_BASED_OUTPATIENT_CLINIC_OR_DEPARTMENT_OTHER): Payer: BLUE CROSS/BLUE SHIELD

## 2011-05-12 ENCOUNTER — Other Ambulatory Visit: Payer: Self-pay | Admitting: Medical Oncology

## 2011-05-12 ENCOUNTER — Ambulatory Visit: Payer: BLUE CROSS/BLUE SHIELD | Attending: Nurse Practitioner

## 2011-05-12 DIAGNOSIS — Z1231 Encounter for screening mammogram for malignant neoplasm of breast: Secondary | ICD-10-CM | POA: Insufficient documentation

## 2011-05-12 DIAGNOSIS — D0590 Unspecified type of carcinoma in situ of unspecified breast: Secondary | ICD-10-CM | POA: Insufficient documentation

## 2011-05-12 LAB — CBC, DIFF
% Basophils: 0 % (ref 0–1)
% Eosinophils: 7 % (ref 0–7)
% Immature Granulocytes: 0 % (ref 0–1)
% Lymphocytes: 38 % (ref 19–53)
% Monocytes: 7 % (ref 5–13)
% Neutrophils: 48 % (ref 34–71)
Absolute Eosinophil Count: 0.44 10*3/uL (ref 0.00–0.50)
Absolute Lymphocyte Count: 2.38 10*3/uL (ref 1.00–4.80)
Basophils: 0 10*3/uL (ref 0.00–0.20)
Hematocrit: 40 % (ref 36–45)
Hemoglobin: 13.7 g/dL (ref 11.5–15.5)
Immature Granulocytes: 0 10*3/uL (ref 0.00–0.05)
MCH: 33.1 pg (ref 27.3–33.6)
MCHC: 34.4 g/dL (ref 32.2–36.5)
MCV: 96 fL (ref 81–98)
Monocytes: 0.44 10*3/uL (ref 0.00–0.80)
Neutrophils: 2.99 10*3/uL (ref 1.80–7.00)
Platelet Count: 259 10*3/uL (ref 150–400)
Platelet Morphology: NORMAL
RBC Morphology: NORMAL
RBC: 4.14 mil/uL (ref 3.80–5.00)
RDW-CV: 12.4 % (ref 11.6–14.4)
WBC: 6.25 10*3/uL (ref 4.3–10.0)

## 2011-05-12 LAB — COMP METABOLIC PANEL
ALT (GPT): 15 U/L (ref 6–40)
AST (GOT): 21 U/L (ref 15–40)
Albumin/Globulin Ratio: 1.1 (ref 1.0–2.4)
Albumin: 4.1 g/dL (ref 3.5–5.2)
Alkaline Phosphatase (Total): 60 U/L (ref 31–132)
Anion Gap: 7 (ref 3–11)
Bilirubin (Total): 0.4 mg/dL (ref 0.2–1.3)
Calcium: 10.1 mg/dL (ref 8.9–10.2)
Carbon Dioxide, Total: 30 mEq/L (ref 22–32)
Chloride: 101 mEq/L (ref 98–108)
Creatinine: 0.87 mg/dL (ref 0.38–1.02)
GFR, Calc, African American: >60 mL/min (ref >59)
GFR, Calc, European American: >60 mL/min (ref >59)
Globulin: 3.6 g/dL (ref 2.0–3.9)
Glucose: 93 mg/dL (ref 62–125)
Potassium: 3.8 mEq/L (ref 3.7–5.2)
Protein (Total): 7.7 g/dL (ref 6.0–8.2)
Sodium: 138 mEq/L (ref 136–145)
Urea Nitrogen: 18 mg/dL (ref 8–21)

## 2011-05-12 LAB — SCREENING MAMMOGRAPHY BILATERAL

## 2011-05-12 LAB — CARCINOEMBRYONIC ANTIGEN: Carcinoembryonic Antigen: 1.4 ng/mL (ref 0.0–5.0)

## 2011-05-14 LAB — VITAMIN D (25 HYDROXY)
Vit D (25_Hydroxy) Total: 83.1 ng/mL — ABNORMAL HIGH (ref 20.1–50.0)
Vitamin D2 (25_Hydroxy): <1 ng/mL
Vitamin D3 (25_Hydroxy): 83.1 ng/mL

## 2011-05-15 ENCOUNTER — Other Ambulatory Visit (HOSPITAL_BASED_OUTPATIENT_CLINIC_OR_DEPARTMENT_OTHER): Payer: Self-pay | Admitting: Nurse Practitioner

## 2011-05-15 ENCOUNTER — Ambulatory Visit (HOSPITAL_BASED_OUTPATIENT_CLINIC_OR_DEPARTMENT_OTHER): Payer: BLUE CROSS/BLUE SHIELD

## 2011-05-15 ENCOUNTER — Ambulatory Visit: Payer: BLUE CROSS/BLUE SHIELD | Attending: Nurse Practitioner | Admitting: Nurse Practitioner

## 2011-05-15 DIAGNOSIS — Z853 Personal history of malignant neoplasm of breast: Secondary | ICD-10-CM

## 2011-05-15 DIAGNOSIS — Z803 Family history of malignant neoplasm of breast: Secondary | ICD-10-CM | POA: Insufficient documentation

## 2011-05-15 DIAGNOSIS — N63 Unspecified lump in unspecified breast: Secondary | ICD-10-CM

## 2011-05-15 DIAGNOSIS — K589 Irritable bowel syndrome without diarrhea: Secondary | ICD-10-CM | POA: Insufficient documentation

## 2011-05-15 LAB — MAMMOGRAM, BOTH BREASTS DIAGNOSTIC

## 2011-05-15 LAB — US EXAM, BREAST(S)

## 2011-05-15 LAB — CA 27.29: Ca 27.29: 22 U/mL (ref 0–37)

## 2011-05-26 ENCOUNTER — Other Ambulatory Visit (HOSPITAL_BASED_OUTPATIENT_CLINIC_OR_DEPARTMENT_OTHER): Payer: Self-pay | Admitting: Nurse Practitioner

## 2011-05-26 ENCOUNTER — Ambulatory Visit: Payer: BLUE CROSS/BLUE SHIELD | Attending: Nurse Practitioner

## 2011-05-26 DIAGNOSIS — M899 Disorder of bone, unspecified: Secondary | ICD-10-CM

## 2011-05-26 DIAGNOSIS — M949 Disorder of cartilage, unspecified: Secondary | ICD-10-CM | POA: Insufficient documentation

## 2011-05-30 ENCOUNTER — Ambulatory Visit (INDEPENDENT_AMBULATORY_CARE_PROVIDER_SITE_OTHER): Payer: 59 | Admitting: Family Medicine

## 2011-05-30 VITALS — BP 138/82 | HR 78 | Temp 98.4°F | Resp 16 | Ht 69.0 in | Wt 190.2 lb

## 2011-05-30 DIAGNOSIS — L259 Unspecified contact dermatitis, unspecified cause: Secondary | ICD-10-CM

## 2011-05-30 DIAGNOSIS — L239 Allergic contact dermatitis, unspecified cause: Secondary | ICD-10-CM

## 2011-05-30 LAB — PR DXA BONE DENSITY, AXIAL

## 2011-05-30 MED ORDER — BETAMETHASONE DIPROPIONATE 0.05 % EX CREA: TOPICAL_CREAM | Freq: Two times a day (BID) | CUTANEOUS | Status: AC

## 2011-05-30 MED ORDER — PREDNISONE 20 MG PO TABS: ORAL_TABLET | ORAL | Status: AC

## 2011-05-30 NOTE — Progress Notes (Signed)
Subjective: Patient has developed a rash left hand initially now her right, and now it has erythema on the backs of her arms it concerns her. She has a new grandchild and she fears spreading sent to the grandchild. She was at the [employer] last week when she first noted this. She went to a clinic and was told that she probably had ringworm was given nystatin/triamcinolone cream to use on it. They did not do a skin scraping.  Objective: Small red plaques on the back of her hands with slight flaking of the skin. KOH scraping was sent and was negative. There is a little flushing of the forearms but does not look like a significant rash there. Assessment: Probably allergic dermatitis  Plan: Betamethasone cream. If not doing better in about 5 days given a course of prednisone.

## 2011-05-30 NOTE — Patient Instructions (Signed)
Used only a thin layer of the cream twice daily on the hand. If not better in about 5 days, didn't take the oral prednisone as directed. Return if worse or no better

## 2011-05-31 ENCOUNTER — Ambulatory Visit (INDEPENDENT_AMBULATORY_CARE_PROVIDER_SITE_OTHER): Payer: BLUE CROSS/BLUE SHIELD | Admitting: Nurse Practitioner

## 2011-05-31 ENCOUNTER — Encounter (INDEPENDENT_AMBULATORY_CARE_PROVIDER_SITE_OTHER): Payer: Self-pay | Admitting: Nurse Practitioner

## 2011-05-31 VITALS — BP 115/77 | HR 65 | Temp 98.1°F

## 2011-05-31 DIAGNOSIS — R059 Cough, unspecified: Secondary | ICD-10-CM

## 2011-05-31 LAB — X-RAY CHEST 2 VW

## 2011-05-31 NOTE — Progress Notes (Signed)
Patient presents with   Chief Complaint   Patient presents with   . Cough     c/o cough x3 weeks that comes and goes.        Past Medical History   Diagnosis Date   . Malignant neoplasm of breast (female), unspecified site 2008     lumpectomy: Stage 0; Radiation; was ERP; She did not complete Tamoxifen due to side effects x1 yr.    . Esophageal reflux         Past Surgical History   Procedure Date   . Vaginal hysterectomy 1994     Ovaries in place   . Knee, r knee at age 50, removed floating bone    . Total abdom hysterectomy      for fibroids   . Colonoscopy 2006     [place] , FU 10 years        HPI:  Paula Bowman is a 61 year old female with was seen a couple of weeks ago diag. With sinus infection was given zithromax it helped the sinus infection but now she has developed a harsh cough, which feels asthmatic, not using any inhalers, seems to be activated with cold air and activity. Has not had any asthma symptoms since quitting smoking many years ago.   Has not taken any antihistamines, has been using her flonase.     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     OBJECTIVE:  BP 115/77  Pulse 65  Temp(Src) 98.1 F (36.7 C) (Temporal)  SpO2 100%  General appearance: Alert, skin warm dry color good, afebrile, no constitutional symptoms.   No sinus tenderness  Ears: R TM - normal, L TM - normal  Nose: clear rhinorrhea,mucosal erythema,mucosal edema  Oropharynx: mild erythremia with +PND  Neck: supple,no adenopathy,thyroid normal size, non-tender,  without nodularity  Lungs clear  HRRR    ASSESSMENT/PLAN:   Cough  (primary encounter diagnosis)  Comment: allergies vs. Bacterial infection   Plan: X-RAY CHEST 2 VW  Probable allergies   Allegra and sudafed. In am and flonase at hs.   If symptoms worsen then will rx. Zithromax.

## 2011-05-31 NOTE — Progress Notes (Signed)
Vitals, medication, and allergies verified by Kameron Wallace, MA

## 2011-05-31 NOTE — Patient Instructions (Signed)
It was a pleasure to see you in clinic today. Your Medical Assistant was: Kameron                You can schedule an appointment to see us by calling [phone] or via eCare.     If labs were ordered today the results are expected to be available via eCare 5 days later. Otherwise, result letters are mailed 7-10 days after your tests are completed. If your physician needs to change your care based on your results, you will receive a phone call to notify you. If you haven't heard from him/her and it has been more than 10 days please give us a call.     Thank you for choosing [facility].

## 2011-06-02 ENCOUNTER — Ambulatory Visit: Payer: Self-pay | Admitting: Family Medicine

## 2011-09-01 ENCOUNTER — Telehealth (INDEPENDENT_AMBULATORY_CARE_PROVIDER_SITE_OTHER): Payer: Self-pay | Admitting: Internal Medicine

## 2011-09-01 ENCOUNTER — Encounter (INDEPENDENT_AMBULATORY_CARE_PROVIDER_SITE_OTHER): Payer: Self-pay | Admitting: Family Practice

## 2011-09-01 ENCOUNTER — Ambulatory Visit (INDEPENDENT_AMBULATORY_CARE_PROVIDER_SITE_OTHER): Payer: BLUE CROSS/BLUE SHIELD | Admitting: Family Practice

## 2011-09-01 VITALS — BP 97/66 | HR 66 | Temp 97.6°F | Wt 115.0 lb

## 2011-09-01 DIAGNOSIS — J309 Allergic rhinitis, unspecified: Secondary | ICD-10-CM

## 2011-09-01 DIAGNOSIS — L259 Unspecified contact dermatitis, unspecified cause: Secondary | ICD-10-CM

## 2011-09-01 DIAGNOSIS — K219 Gastro-esophageal reflux disease without esophagitis: Secondary | ICD-10-CM | POA: Insufficient documentation

## 2011-09-01 DIAGNOSIS — B351 Tinea unguium: Secondary | ICD-10-CM | POA: Insufficient documentation

## 2011-09-01 DIAGNOSIS — L309 Dermatitis, unspecified: Secondary | ICD-10-CM | POA: Insufficient documentation

## 2011-09-01 MED ORDER — FLUTICASONE PROPIONATE 50 MCG/ACT NA SUSP
NASAL | Status: DC
Start: 2011-09-01 — End: 2013-11-10

## 2011-09-01 MED ORDER — TERBINAFINE HCL 1 % EX CREA
TOPICAL_CREAM | CUTANEOUS | Status: DC
Start: 2011-09-01 — End: 2011-09-01

## 2011-09-01 MED ORDER — FLUTICASONE PROPIONATE 0.005 % EX OINT
TOPICAL_OINTMENT | CUTANEOUS | Status: DC
Start: 2011-09-01 — End: 2013-02-03

## 2011-09-01 MED ORDER — FLUTICASONE PROPIONATE 0.005 % EX OINT
TOPICAL_OINTMENT | CUTANEOUS | Status: DC
Start: 2011-09-01 — End: 2011-09-01

## 2011-09-01 MED ORDER — ESOMEPRAZOLE MAGNESIUM 40 MG OR CPDR
DELAYED_RELEASE_CAPSULE | ORAL | Status: DC
Start: 2011-09-01 — End: 2012-01-11

## 2011-09-01 MED ORDER — NAFTIFINE HCL 1 % EX CREA
TOPICAL_CREAM | CUTANEOUS | Status: DC
Start: 2011-09-01 — End: 2011-09-01

## 2011-09-01 NOTE — Telephone Encounter (Signed)
CONFIRMED PHONE NUMBER: [phone]  CALLERS FIRST AND LAST NAME: Torrie  FACILITY NAME: [facility] TITLE: pharmacy  CALLERS RELATIONSHIP:OTHER:   RETURN CALL: OK to leave detailed message with anyone that answers    SUBJECT: General Message   REASON FOR REQUEST: change medication    MESSAGE: pharmacy would like to take change Fluticasone to cream and Terbinafine was proscribed as cream they would like to change it to tablet they do not have cream in stock. Pt is at pharmacy waiting

## 2011-09-01 NOTE — Progress Notes (Signed)
Roomed and vitals taken by Karla Barron, MA

## 2011-09-01 NOTE — Telephone Encounter (Signed)
[facility] has the Rx.

## 2011-09-01 NOTE — Telephone Encounter (Signed)
Please ask patient to take her original prescription to another pharmacy

## 2011-09-01 NOTE — Addendum Note (Signed)
Addended by: Antonietta Jewel on: 09/01/2011 05:43 PM     Modules accepted: Orders

## 2011-09-01 NOTE — Telephone Encounter (Signed)
Patient does not want a oral agent . She only wants topical medication for her nail fungal infection .     We can change to Naftin .     As for fluticasone TOPICAL , its ok for cream

## 2011-09-01 NOTE — Telephone Encounter (Signed)
I spoke with the patient ,informed her that oral agents are the best therapy for Tinea manum ( hand finger nail infection ) she wants to try OTC nail fungal topical for 2-3 weeks if their is no resolution then she will consider oral antifungal agent. She verbalized understanding and is in agreement with this current therapy

## 2011-09-01 NOTE — Telephone Encounter (Signed)
To Karla

## 2011-09-01 NOTE — Patient Instructions (Addendum)
It was a pleasure to see you in clinic today. Your Medical Assistant was: Ulanda Edison can schedule an appointment to see [place] by calling [phone] or via eCare.     If labs were ordered today the results are expected to be available via eCare 5 days later. Otherwise, result letters are mailed 7-10 days after your tests are completed. If your physician needs to change your care based on your results, you will receive a phone call to notify you. If you haven't heard from him/her and it has been more than 10 days please give [place] a call.     Thank you for choosing [facility].         A/P   Onychomycosis  (primary encounter diagnosis)  Comment/ Plan:  We will treat with topical turbinate BID for  2 weeks and keep  Nails dry .       692.9 Eczema  Comment/ Plan: apply liberally for 2 weeks as needed    Fluticasone Propionate (CUTIVATE)         0.005 % External Ointment,            Index Spanish version   Fungal Infection of a Nail (Onychomycosis)   What is onychomycosis?   Onychomycosis, also called tinea unguium, is a fungal infection of the fingernail or toenail.   How does it occur?   The fungus that causes this infection usually spreads from infected skin close to the nail.   What are the symptoms?   Infected nails are thickened and yellow or brown. They are more brittle than uninfected nails. They may crumble or flake.   How is it diagnosed?   Your healthcare provider will examine the nail. Your provider may take a scraping of the nail and look at it under a microscope for evidence of fungal infection. A sample of the nail may be sent to the lab for tests.   How is it treated?   If the infection is very mild, your provider may prescribe medicine you can put on the nail. For more severe infections, your provider may prescribe an antifungal medicine to be taken by mouth.   How long will the effects last?   You may need to take the medicine until the nail grows all the way out and there  is no longer any sign of the fungal infection. This usually takes about 6 months for fingernails and 12 months for toenails.   How can I help prevent onychomycosis?   Fungi grow where it is wet. To keep fungal infections from occurring or to keep them from coming back once they have been treated, it's important to keep your hands and feet as dry as possible. It may help to:    Put an antiperspirant medicine on your feet to prevent sweating.    Change your socks often.    Wear shoes that breathe well.    Avoid going barefoot in public places where you might be exposed to a fungus, such as shower stalls at the gym. Wear shower shoes and clean them often.   Written by Arnette Norris, MD.   Published by [employer].   Last modified: 2003-12-29   Last reviewed: 2006-07-22   This content is reviewed periodically and is subject to change as new health information becomes available. The information is intended to inform and educate and is not a replacement for medical  evaluation, advice, diagnosis or treatment by a healthcare professional.   Adult Health Advisor [phone] Index   2009 [employer] and/or its affiliates. All Rights Reserved.

## 2011-09-01 NOTE — Progress Notes (Signed)
NW:GNFA    @pch @  History     Social History   . Marital Status: Media planner     Spouse Name: N/A     Number of Children: N/A   . Years of Education: N/A     Occupational History   . Not on file.     Social History Main Topics   . Smoking status: Former Smoker -- 2.0 packs/day for 15 years     Types: Cigarettes     Quit date: 01/07/1988   . Smokeless tobacco: Not on file   . Alcohol Use: 0.5 oz/week     1 drink(s) per week      2 glasses of wine daily   . Drug Use: No   . Sexually Active: Yes -- Female partner(s)     Other Topics Concern   . 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 For the past 3 months , progressively getting worse with the isolated nail ,other nails on  the hand is not affected . patient had a previous  tinea pedis and was taking oral medication  she had a reaction and would rather not take any oral  antifungal medication.     Rash : patient has an active eczema  rash on her palmer aspect of her hands - she used topical steroids  , has not had any medication on over 1 year . Would like a refill .     Review of Systems   Constitutional: Negative for activity change, appetite change and fatigue.   Skin: Positive for rash. Negative for color change and wound.        See Nail changes      Objective: BP 97/66  Pulse 66  Temp 97.6 F (36.4 C) (Temporal)  Wt 115 lb (52.164 kg)  SpO2 100%     Physical Exam   Constitutional: She is oriented to person, place, and time and well-developed, well-nourished, and in no distress. No distress.   HENT:   Head: Normocephalic and atraumatic.   Neurological: She is alert and oriented to person, place, and time. Gait normal.   Skin: Skin is warm and dry. Rash noted. She is not diaphoretic. There is erythema. No pallor.        NAILS:  Inspection of  the nail beds: their is white  superficial dislocation of the Right thumb nail other nails are normal in appearance     No clubbing, cyanosis, petechiae or hemorrhages.    B/L palmer aspect of the hand : Xerosis , erythema    Psychiatric: Mood and affect normal.     A/P   Onychomycosis  (primary encounter diagnosis)  Comment/ Plan:  At this point the patient does not want to take oral agents due to previous bad experience with oral antifungal - she would rather use topical agent-  We will treat with topical turbinate topically  BID for  2 weeks and keep  Nails dry .     692.9 Eczema  Comment/ Plan: apply liberally for 2 weeks as needed    Fluticasone Propionate (CUTIVATE)         0.005 % External Ointment,     Other refills  have been completed.

## 2011-09-01 NOTE — Telephone Encounter (Signed)
I called pharmacy and Rx Naftin will cost patient $111.00 after her insurance, Dr. Lenox Ahr any other suggestion for this Rx? Please advise.

## 2011-09-01 NOTE — Telephone Encounter (Signed)
I called patient and she is just going to wait to pick up the Terbinafine on Monday, [facility] will order. But patient was upset because the antifungal Rx is an OTC she was hoping for something a lot stronger than that.I will route TE to tomorrow maybe covering provider can Rx something different for the patient?

## 2011-10-03 ENCOUNTER — Ambulatory Visit (INDEPENDENT_AMBULATORY_CARE_PROVIDER_SITE_OTHER): Payer: 59 | Admitting: Family Medicine

## 2011-10-03 VITALS — BP 124/73 | HR 78 | Temp 98.5°F | Resp 16 | Ht 69.0 in | Wt 186.0 lb

## 2011-10-03 DIAGNOSIS — I10 Essential (primary) hypertension: Secondary | ICD-10-CM | POA: Insufficient documentation

## 2011-10-03 DIAGNOSIS — F411 Generalized anxiety disorder: Secondary | ICD-10-CM

## 2011-10-03 DIAGNOSIS — G479 Sleep disorder, unspecified: Secondary | ICD-10-CM | POA: Insufficient documentation

## 2011-10-03 DIAGNOSIS — F419 Anxiety disorder, unspecified: Secondary | ICD-10-CM

## 2011-10-03 MED ORDER — ALPRAZOLAM 1 MG PO TABS: ORAL_TABLET | ORAL | Status: DC

## 2011-10-03 MED ORDER — VALSARTAN 160 MG PO TABS: 160.0000 mg | ORAL_TABLET | Freq: Every day | ORAL | Status: DC

## 2011-10-03 NOTE — Progress Notes (Signed)
Subjective: Charnita is here for refills of her medications. She takes the Xanax in the evening usually, one half of a pill most of the time. It helps her to relax and sleep. She got off her blood pressure medicine for a fall, and her blood pressure went up so she is back on it. She feels well with a fairly normal review of systems. No significant chest pains except when she is a little anxious he gets tight. No breathing or GI problems a regular basis. She does occasionally have to take TUMS. She has a history of elevated lipids, but has been intolerant to the medication so does not take anything for that. We discussed exercise on a more regular basis. 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  Objective: Chest clear Heart regular without murmurs  Assessment: Hypertension good control Anxiety and sleep disturbance History of hyperlipidemia  Plan: Same medications. We will refill. Return as needed.

## 2011-10-03 NOTE — Patient Instructions (Addendum)
Return in 6 mo or as needed.

## 2011-12-19 ENCOUNTER — Other Ambulatory Visit: Payer: Self-pay

## 2012-01-10 ENCOUNTER — Telehealth (INDEPENDENT_AMBULATORY_CARE_PROVIDER_SITE_OTHER): Payer: Self-pay | Admitting: Internal Medicine

## 2012-01-10 DIAGNOSIS — Z0000 Encounter for general adult medical examination without abnormal findings: Secondary | ICD-10-CM

## 2012-01-10 NOTE — Telephone Encounter (Signed)
Future lab orders placed

## 2012-01-10 NOTE — Telephone Encounter (Signed)
To Dr.Bhatti would you like to place orders ?

## 2012-01-10 NOTE — Telephone Encounter (Signed)
Pt scheduled for wellness exam w/ Dr Clydie Braun @ 3pm tomorrow. Is requesting to have lipids and "standard tests" drawn in the AM before appt. Could these be ordered? Please advise.

## 2012-01-10 NOTE — Telephone Encounter (Signed)
Thank you for the quick response. Pt scheduled for 8:45 labs, then 3PM w/ Dr Clydie Braun. Pt was informed that results would not be ready by appt.

## 2012-01-10 NOTE — Telephone Encounter (Signed)
To psr for scheduling.

## 2012-01-11 ENCOUNTER — Encounter (INDEPENDENT_AMBULATORY_CARE_PROVIDER_SITE_OTHER): Payer: Self-pay | Admitting: Internal Medicine

## 2012-01-11 ENCOUNTER — Ambulatory Visit (INDEPENDENT_AMBULATORY_CARE_PROVIDER_SITE_OTHER): Payer: BLUE CROSS/BLUE SHIELD | Admitting: Internal Medicine

## 2012-01-11 ENCOUNTER — Other Ambulatory Visit (INDEPENDENT_AMBULATORY_CARE_PROVIDER_SITE_OTHER): Payer: BLUE CROSS/BLUE SHIELD

## 2012-01-11 VITALS — BP 101/66 | HR 82 | Wt 115.0 lb

## 2012-01-11 DIAGNOSIS — Z0000 Encounter for general adult medical examination without abnormal findings: Secondary | ICD-10-CM

## 2012-01-11 DIAGNOSIS — K219 Gastro-esophageal reflux disease without esophagitis: Secondary | ICD-10-CM

## 2012-01-11 DIAGNOSIS — J309 Allergic rhinitis, unspecified: Secondary | ICD-10-CM

## 2012-01-11 DIAGNOSIS — G4762 Sleep related leg cramps: Secondary | ICD-10-CM

## 2012-01-11 DIAGNOSIS — K148 Other diseases of tongue: Secondary | ICD-10-CM

## 2012-01-11 LAB — CBC, DIFF
% Basophils: 1 %
% Eosinophils: 18 %
% Immature Granulocytes: 0 %
% Lymphocytes: 23 %
% Monocytes: 9 %
% Neutrophils: 49 %
Absolute Eosinophil Count: 1.27 10*3/uL — ABNORMAL HIGH (ref 0.00–0.50)
Absolute Lymphocyte Count: 1.61 10*3/uL (ref 1.00–4.80)
Basophils: 0.04 10*3/uL (ref 0.00–0.20)
Hematocrit: 38 % (ref 36–45)
Hemoglobin: 12.4 g/dL (ref 11.5–15.5)
Immature Granulocytes: 0.01 10*3/uL (ref 0.00–0.05)
MCH: 32.5 pg (ref 27.3–33.6)
MCHC: 33.1 g/dL (ref 32.2–36.5)
MCV: 98 fL (ref 81–98)
Monocytes: 0.66 10*3/uL (ref 0.00–0.80)
Neutrophils: 3.44 10*3/uL (ref 1.80–7.00)
Platelet Count: 206 10*3/uL (ref 150–400)
RBC: 3.81 mil/uL (ref 3.80–5.00)
RDW-CV: 12.6 % (ref 11.6–14.4)
WBC: 7.03 10*3/uL (ref 4.3–10.0)

## 2012-01-11 LAB — COMPREHENSIVE METABOLIC PANEL
ALT (GPT): 18 U/L (ref 6–40)
AST (GOT): 25 U/L (ref 15–40)
Albumin: 3.8 g/dL (ref 3.5–5.2)
Alkaline Phosphatase (Total): 43 U/L (ref 31–132)
Anion Gap: 7 (ref 3–11)
Bilirubin (Total): 0.5 mg/dL (ref 0.2–1.3)
Calcium: 9.4 mg/dL (ref 8.9–10.2)
Carbon Dioxide, Total: 29 mEq/L (ref 22–32)
Chloride: 103 mEq/L (ref 98–108)
Creatinine: 0.87 mg/dL (ref 0.38–1.02)
GFR, Calc, African American: >60 mL/min (ref >59)
GFR, Calc, European American: >60 mL/min (ref >59)
Glucose: 93 mg/dL (ref 62–125)
Potassium: 4.1 mEq/L (ref 3.7–5.2)
Protein (Total): 6.2 g/dL (ref 6.0–8.2)
Sodium: 139 mEq/L (ref 136–145)
Urea Nitrogen: 29 mg/dL — ABNORMAL HIGH (ref 8–21)

## 2012-01-11 LAB — THYROID STIMULATING HORMONE: Thyroid Stimulating Hormone: 1.892 u[IU]/mL (ref 0.400–5.000)

## 2012-01-11 LAB — LIPID PANEL
Cholesterol (LDL): 109 mg/dL (ref <130)
Cholesterol/HDL Ratio: 2.5
HDL Cholesterol: 78 mg/dL (ref >40)
Non-HDL Cholesterol: 119 mg/dL (ref 0–159)
Total Cholesterol: 197 mg/dL (ref <200)
Triglyceride: 51 mg/dL (ref <150)

## 2012-01-11 MED ORDER — FEXOFENADINE HCL 180 MG OR TABS
ORAL_TABLET | ORAL | Status: DC
Start: 2012-01-11 — End: 2014-01-20

## 2012-01-11 MED ORDER — ESOMEPRAZOLE MAGNESIUM 40 MG OR CPDR
DELAYED_RELEASE_CAPSULE | ORAL | Status: DC
Start: 2012-01-11 — End: 2012-02-08

## 2012-01-11 NOTE — Patient Instructions (Addendum)
It was a pleasure to see you in clinic today. Your Medical Assistant was: Wandalee Ferdinand, CMA                You can schedule an appointment to see [place] by calling [phone] or via eCare.     If labs were ordered today the results are expected to be available via eCare 5 days later. Otherwise, result letters are mailed 7-10 days after your tests are completed. If your physician needs to change your care based on your results, you will receive a phone call to notify you. If you haven't heard from him/her and it has been more than 10 days please give [place] a call.     Thank you for choosing [facility].    MAMMOGRAPHY SERVICES    Screening Mammograms  [facility] is pleased to offer our patients the convenience of having screening mammograms performed right at your neighborhood clinic. Your x-ray will be taken by a registered mammography technologist and will be read by a radiologist at the [facility] who specializes in mammography.     Scheduling  Scheduling your mammogram is easy!  Just stop at the front desk of your clinic or call us to schedule your 15 minute appointment.  Mammography services are available one day every other week.  DO NOT WEAR POWDER OR DEODORANT ON THE DAY OF YOUR APPOINTMENT.    Diagnostic Mammograms   Some patients need more in-depth studies than what we are able to perform at your neighborhood clinic.  If your doctor wants you to have a diagnostic mammogram, the referral coordinator at your clinic will assist you in making an appointment at [facility] or at [facility].     Test Results   The results of your mammogram will be sent to you in the mail by the [facility] Radiology Department.  You should receive your results within 7-10 working days. Your doctor will also receive a copy of this report.  If further mammography studies are required, you will be asked to schedule a follow-up appointment with the [facility]  [facility] Mammography Department.     Repeat Mammograms   Occasionally, the mammogram will be difficult for the radiologist to read, due to motion effect.  If this is the case, you will receive a letter asking you to schedule a repeat mammogram at your neighborhood clinic at no charge.     Billing/Insurance   You will receive a bill from [employer] stating the name of the radiologist who interpreted your mammogram.  If you have insurance, we will also bill your insurance company.  It is suggested that you call your insurance company to check your coverage.      Recommendations for Pap Smear screening age 37-65    If you have had three normal Pap test results in a row and no new partners, pap smear every 3 years OR pap smear and HPV screening every 5 years  o Exceptions:     if you have ever been treated for moderate or severe dysplasia of the cervix and have completed post-treatment surveillance, you can return to routine screening for your age group, and continue Pap screening for at least twenty years.    If you are HIV positive you should have a pap smear every six months for a year after diagnosis and then every year.    If you have a suppressed immune system you  should have a pap yearly    Discontinue screening at age 72 if there have been three consecutive normal Pap smears and no abnormal in the past ten years with the most recent testing in the last 5 years    If you are sexually active, you should have a yearly screening for Chlamydia, HIV, and other sexually transmitted infections as appropriate    If you are using contraception, you should see your provider yearly for symptom review, blood pressure check (if using a hormonal method), and exam if needed.    Component      Latest Ref Rng 01/11/2012   WBC      4.3 - 10.0 THOU/uL 7.03   RBC      3.80 - 5.00 mil/uL 3.81   Hemoglobin      11.5 - 15.5 g/dL 16.1   Hematocrit      36 - 45 % 38   MCV      81 - 98 fL 98   Mch       27.3 - 33.6 pg 32.5   Mchc      32.2 - 36.5 g/dL 09.6   Platelet Count      150 - 400 THOU/uL 206   RDW-CV      11.6 - 14.4 % 12.6   % Neutrophils       49   % Lymphocytes       23   % Monocytes       9   % Eosinophils       18   % Basophils       1   % Immature Granulocytes       0   Neutrophils      1.80 - 7.00 THOU/uL 3.44   Absolute Lymphocyte Count      1.00 - 4.80 THOU/uL 1.61   Monocytes      0.00 - 0.80 THOU/uL 0.66   Eosinophils      0.00 - 0.50 THOU/uL 1.27 (H)   Absolute Basophile Count      0.00 - 0.20 THOU/uL 0.04   Immature Granulocytes      0.00 - 0.05 THOU/uL 0.01   Sodium, Plasma      136 - 145 mEq/L 139   Potassium      3.7 - 5.2 mEq/L 4.1   Chloride      98 - 108 mEq/L 103   Carbon Dioxide      22 - 32 mEq/L 29   Ion Gap      3 - 11 7   Glucose      62 - 125 mg/dL 93   Urea Nitrogen      8 - 21 mg/dL 29 (H)   Creatinine      0.38 - 1.02 mg/dL 0.45   Protein      6.0 - 8.2 g/dL 6.2   Albumin      3.5 - 5.2 g/dL 3.8   Bilirubin (Total)      0.2 - 1.3 mg/dL 0.5   Calcium      8.9 - 10.2 mg/dL 9.4   Aspartate Aminotransferase (GOT)      15 - 40 U/L 25   Alkaline Phosphatase (Total)      31 - 132 U/L 43   Alanine Aminotransferase (GPT)      6 - 40 U/L 18   Gfr, Calc, European American      >59 mL/min >60   GFR, Calc,  African American      >59 mL/min >60   GFR, Information       Calculated GFR in mL/min/1.73 m2 by MDRD equation. . . .   Cholesterol (Total)      <200 mg/dL 161   Triglyceride      <150 mg/dL 51   Cholesterol (HDL)      >40 mg/dL 78   Cholesterol (LDL)      <130 mg/dL 096   Non-HDL Cholesterol      0 - 159 mg/dL 045   Cholesterol/ HDL Ratio       2.5   Lipid Panel, Additional Info.        . . .       Nocturnal leg cramps  - Try exercises and stretches prior to bed  - Try over the counter magnesium supplementation   - Drink lots of fluids

## 2012-01-11 NOTE — Progress Notes (Signed)
Vitals taken, Medication and allergies verified. Patient roomed by: Carolyn Daly, CMA

## 2012-01-11 NOTE — Progress Notes (Signed)
Paula Bowman is a 61 year old female here today for a preventive health visit. Other problems or concerns today:     1. Tongue- patches on tongue, ongoing for months, perhaps one year, worsening, no pain, no odynophagia. Smoked for 13 years, few packs per day, has not smoked for 25 years.    Hx of breast cancer    2. Nocturnal leg cramps- ongoing x many months. Often has to get up and walk around. Notices improvement after drinking fluids. 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 Patient has had a hysterectomy. Still has ovaries  Menses: no longer menstruating  Menopausal symptoms: hot flashes and sleep disturbance, vaginal dryness-   Incontinence: No  Last pap: 2012- see gynecologist  Pap history: no history of abnormal paps  Contraception: NOT INDICATED  History of HRT: 6 months  Other gyn history: none    SEXUAL HISTORY  Sexual activity: yes, single partner, contraception - not needed  HIV risk: Denies, multiple sexual partners, history of a sexually transmitted disease, transfusion between 1978 and 1985 and use of illicit drugs by injection  Sexual concerns: No  History of sexual or physical abuse: No  Have you been hit, kicked, punched, or otherwise hurt by someone within the past year: No    CANCER SCREENING  Family history of colon cancer: NO  Family history of uterine or ovarian cancer: NO  Family history of breast cancer: YES: Sister at 62.  Patient has personal hx of breast cancer, diagnosed in 2010.     Prior mammogram: YES ; last year  History of abnormal mammogram: NO    LIFESTYLE  Current dietary habits: healthy diet in general, no processed foods  Calcium: calcium supplements:  YES  vitamin D supplements:  YES  Current exercise habits: fast walking, exercise 6 weeks daily, play pickle ball  Regular seat belt use: YES  Substance use:  reports  that she quit smoking about 24 years ago. Her smoking use included Cigarettes. She has a 30 pack-year smoking history. She does not have any smokeless tobacco history on file. She reports that she drinks about 0.5 ounces of alcohol per week. She reports that she does not use illicit drugs.   Exposure to hazardous materials: No  Guns in the house: Yes: locked up    History sections of chart reviewed and updated today: YES    Review Of Systems:  Constitutional: Denies, fatigue, unexpected weight loss, unexpected weight gain  Regular dental exams: yes  Eye: Denies, blurry vision, diplopia, eye pain  ENT: Denies, hearing difficulties, nasal congestion and any ear nose or throat problems  Respiratory: Denies, dyspnea on exertion, dyspnea at rest, cough, wheezing  Cardiovascular: Denies, chest pain or pressure at rest or during exercise, palpitations  GI: Denies, change in appetite, dysphagia, nausea, vomiting, dyspepsia, abdominal pain, diarrhea, constipation, hematochezia, melena  GU: Denies, menstrual problems including irregular, heavy or painful menses, prior abnormal pap smear, dysuria, hematuria, urinary incontinence, urinary urgency, vaginal discharge, abnormal vaginal bleeding, pelvic pain  Derm: Denies, skin rash, easy bruising, hair loss, changes in moles or new moles  Rheumatologic/Musculoskeletal: Denies, joint swelling, joint stiffness, any joint or arthritic problems  Endocrine:  Denies, polyuria, polydipsia, heat intolerance, cold intolerance, prior blood sugar problems, prior thyroid problems  Neurologic: Denies, headaches and tremors  Psych: Denies, depressed mood, sleep disturbance and anxiety    Leg cramping at night and legs hurt around ankles.       EXAM:  General: healthy, alert, no distress  Skin: Skin color, texture, turgor normal. No rashes or concerning lesions  Head: Normocephalic. No masses, lesions, tenderness or abnormalities  Eyes: Lids/periorbital skin normal, Conjunctivae/corneas clear,  PERRL, EOM's intact  Oropharynx: Tongue with circular lesion on left lateral aspect and tip of tongue, with loss of papillae  Neck: supple. No adenopathy. Thyroid symmetric, normal size, without nodules  Lungs: clear to auscultation  Heart: normal rate, regular rhythm and no murmurs, clicks, or gallops  Breasts: not done: discussed recommendations for age, patient does BSE, patient declined, No obvious deformity or mass to inspection, nipples everted bilaterally, no skin lesion or nipple discharge, no mass palpated, no axillary lymphadenopathy  Abd: soft, non-tender. BS normal. No masses or organomegaly  GU: deferred  Rectal: deferred  Extremities: Normal, without deformities, edema, or skin discoloration, radial and DP pulses 2+ bilaterally  Neuro:  Grossly normal to observation, gait normal      ASSESSMENT AND PLAN:    Health Maintenance   Topic Date Due   . Zoster Vaccine  04/09/2010   . Mammogram Q50mos  11/15/2011   . Influenza > 6 Months  11/20/2012   . Tetanus Booster  02/20/2013   . Colonoscopy Q 10 Years  02/20/2014   . Cholesterol-female  01/10/2017     Immunizations due: none - up to date  Screening tests discussed: Diabetes (FBS if BP > 135/80) and Lipids  History sections of chart reviewed and updated today: YES    (V70.0) Routine general medical examination at a health care facility  (primary encounter diagnosis)    (530.81) GERD (gastroesophageal reflux disease)  Plan: Esomeprazole Magnesium (NEXIUM) 40 MG Oral         CAPSULE DELAYED RELEASE    (529.8) Tongue lesion- given smoking hx, concern for possible malignant lesion. Does not appear to be fungal or lichen planus  Plan: REFERRAL TO OTO-HEAD NECK SURGERY    (477.9) Allergic rhinitis  Plan: Fexofenadine HCl (ALLEGRA) 180 MG Oral Tab    Nocturnal leg cramps  - Try magnesium  - Drink water  - Leg stretches  - Info provided  The following were reviewed with patient:  We discussed her lab results from today  Patient information given for the following:  nocturnal leg cramps    Follow up: 1 year  Lab tests and results of any diagnostic studies will be shared with the patient by  eCare

## 2012-02-08 ENCOUNTER — Telehealth (INDEPENDENT_AMBULATORY_CARE_PROVIDER_SITE_OTHER): Payer: Self-pay | Admitting: Internal Medicine

## 2012-02-08 DIAGNOSIS — K219 Gastro-esophageal reflux disease without esophagitis: Secondary | ICD-10-CM

## 2012-02-08 MED ORDER — ESOMEPRAZOLE MAGNESIUM 40 MG OR CPDR
DELAYED_RELEASE_CAPSULE | ORAL | Status: DC
Start: 2012-02-08 — End: 2014-01-20

## 2012-02-08 NOTE — Telephone Encounter (Signed)
Call to pt LM that rx has been faxed in

## 2012-02-08 NOTE — Telephone Encounter (Signed)
Dr. Clydie Braun - Pt is requesting Nexium be sent to his mail order pharmacy. However it is d/c'ed on his med list. Just prescribed on 01/11/2012. Please advise.

## 2012-02-08 NOTE — Telephone Encounter (Signed)
CONFIRMED PHONE NUMBER: [phone]  CALLERS FIRST AND LAST NAME: self  FACILITY NAME: n/a TITLE: n/a  CALLERS RELATIONSHIP:Self  RETURN CALL: General message OK    SUBJECT: Prescription Management   REASON FOR REQUEST: changing to [facility]     MEDICATION: nexium  CONCERN/QUESTION: changing to [facility]   PRESCRIBING PROVIDER: Bhatti  DOSE TAKING NOW: 1 daily   PHARMACY NAME, LOCATION, & PHONE #: [facility] [address] [place] [facility] [phone] [id number]    Pt stopping into clinic. Requesting to change pharmacy from [facility] to [facility]. Requesting Nexium prescription to be resent there. Please advise.

## 2012-02-08 NOTE — Telephone Encounter (Signed)
E-scribed to [facility] pharmacy

## 2012-04-11 ENCOUNTER — Other Ambulatory Visit: Payer: Self-pay | Admitting: Medical Oncology

## 2012-04-11 ENCOUNTER — Ambulatory Visit: Payer: PRIVATE HEALTH INSURANCE | Attending: Medical Oncology

## 2012-04-11 DIAGNOSIS — Z1231 Encounter for screening mammogram for malignant neoplasm of breast: Secondary | ICD-10-CM | POA: Insufficient documentation

## 2012-04-11 LAB — SCREENING MAMMOGRAPHY BILATERAL

## 2012-05-13 ENCOUNTER — Encounter (INDEPENDENT_AMBULATORY_CARE_PROVIDER_SITE_OTHER): Payer: Self-pay | Admitting: Family Practice

## 2012-05-13 ENCOUNTER — Ambulatory Visit (INDEPENDENT_AMBULATORY_CARE_PROVIDER_SITE_OTHER): Payer: PRIVATE HEALTH INSURANCE | Admitting: Family Practice

## 2012-05-13 VITALS — BP 104/65 | HR 75 | Temp 97.6°F | Resp 12 | Wt 116.0 lb

## 2012-05-13 DIAGNOSIS — K469 Unspecified abdominal hernia without obstruction or gangrene: Secondary | ICD-10-CM

## 2012-05-13 DIAGNOSIS — Z23 Encounter for immunization: Secondary | ICD-10-CM

## 2012-05-13 DIAGNOSIS — R590 Localized enlarged lymph nodes: Secondary | ICD-10-CM

## 2012-05-13 DIAGNOSIS — R599 Enlarged lymph nodes, unspecified: Secondary | ICD-10-CM

## 2012-05-13 NOTE — Patient Instructions (Addendum)
It was a pleasure to see you in clinic today. Your Medical Assistant was: Orpah Clinton can schedule an appointment to see [place] by calling [phone] or via eCare.     If labs were ordered today the results are expected to be available via eCare 5 days later. Otherwise, result letters are mailed 7-10 days after your tests are completed. If your physician needs to change your care based on your results, you will receive a phone call to notify you. If you haven't heard from him/her and it has been more than 10 days please give [place] a call.     Thank you for choosing [facility].         A/P  (553.9) Hernia  (primary encounter diagnosis)  Plan:  This could be related to hernia vs other causes , at this point her vitals are stable - PO stable + gs + BM .   We will obtain U/S and referral . If this condition gets worse please call me or seek medical help   US EXAM, ABDOM, COMPLETE, REFERRAL TO GENERAL         SURGERY    (V04.89) Need for Zostavax administration  Plan: ZOSTER SHINGLES VACCINE LIVE SUBCUTANEOUS    (785.6) LAD (lymphadenopathy), inguinal  Plan:  Unclear of the cause - could be lipoma vs LAD vs other causes we will proceed with U/S   US EXAM, PELVIC, COMPLETE      Hernia [Adult]    A hernia is a bulge of the intestines or surrounding tissues through a tear in the muscle of the abdomen or groin. This may occur as a result of excessive coughing, heavy lifting or being overweight. It can also occur at the site of prior surgery. When a hernia first appears it may be painful due to stretching and tearing of the muscle fibers. When you lie down, the bulge should reduce in size or disappear completely. If it does not, and you are unable to flatten it with your hand, medical attention is needed at once.  Home Care:  Avoid heavy lifting and straining or any activities that cause pain in the hernia.  Follow Up  with your physician as directed by our staff.  Get Prompt Medical  Attention  if any of the following occur:   Increasing size of the hernia   Increasing pain in the hernia   A hernia that does not get smaller when you lie down   Hardening of the hernia   Abdominal swelling, fever or repeated vomiting   Pain moves to the lower right abdomen (just below the waistline) or spreads to the back   [address], [address], [place], [place] [address]. All rights reserved. This information is not intended as a substitute for professional medical care. Always follow your healthcare professional's instructions.

## 2012-05-13 NOTE — Progress Notes (Signed)
CC/HPI :  Lump on stomach / groin area     Medication and allergy reviewed and updated as appropriate     HPI: Paula Bowman is a 62 year old female with  Lump on stomach  X several years , she was told its hernia . In the past 1 year she was told it was another condition , per patient this has increase in size and pain at times , typically worse with eating and drinking especially water associated with :  burping   .  Pain : aching . intermittent . Lasting 2-3 days then resolves .  Patient has GERD and takes Nexium Q 48 hrs that condition is  Under control and not related to her abdominal distention.  She has never tried any treatment or further investigation. Pain  : 5/10 . No trauma related to this condition , no  Abdominal history     Left groin area with X separate swelling  , non tender X 1 week , has not changed in size .     Review of Systems   Constitutional: Negative for fever, appetite change and fatigue.   Gastrointestinal: Negative for nausea, vomiting, blood in stool and anal bleeding.   Genitourinary: Negative for difficulty urinating.       History   Substance Use Topics   . Smoking status: Former Smoker -- 2.00 packs/day for 15 years     Types: Cigarettes     Quit date: 01/07/1988   . Smokeless tobacco: Not on file   . Alcohol Use: 0.5 oz/week     1 drink(s) per week      Comment: 2 glasses of wine daily     Patient Active Problem List   Diagnosis   . PERS HX OF BREAST MALIGNANCY   . Anxiety state, unspecified   . Other chronic allergic conjunctivitis   . GERD (gastroesophageal reflux disease)   . Eczema   . Allergic rhinitis   . Onychomycosis     Objective: BP 104/65  Pulse 75  Temp(Src) 97.6 F (36.4 C) (Temporal)  Resp 12  Wt 116 lb (52.617 kg)  BMI 17.89 kg/m2  SpO2 98%   Physical Exam   Constitutional: She is oriented to person, place, and time and well-developed, well-nourished, and in no distress. No distress.   HENT:   Head: Normocephalic and atraumatic.   Eyes: Conjunctivae are  normal. Pupils are equal, round, and reactive to light. Right eye exhibits no discharge. Left eye exhibits no discharge. No scleral icterus.   Abdominal: Soft. She exhibits mass. She exhibits no distension. There is tenderness. There is no rebound and no guarding.   + epigastric mass , tender to palpation , unable to reduce    Genitourinary:   Left inguinal area : with LAD X 2  separate mass , non tender , non- mobile . Soft    Neurological: She is alert and oriented to person, place, and time. Gait normal.   Skin: Skin is warm and dry. No rash noted. She is not diaphoretic. No erythema. No pallor.   Psychiatric: Affect normal.       A/P  (553.9) Hernia  (primary encounter diagnosis)  Plan:  This could be related to hernia vs other causes , at this point her vitals are stable - PO stable + gs + BM .   I have told the patient about strangulation / incarceration.  of the Bowel   We will obtain U/S and referral . If  this condition gets worse please call me or seek medical help   US EXAM, ABDOM, COMPLETE, REFERRAL TO GENERAL         SURGERY    (V04.89) Need for Zostavax administration  Plan: ZOSTER SHINGLES VACCINE LIVE SUBCUTANEOUS    (785.6) LAD (lymphadenopathy), inguinal  Plan:  Unclear of the cause - could be lipoma vs LAD vs other causes we will proceed with U/S   US EXAM, PELVIC, COMPLETE

## 2012-05-13 NOTE — Progress Notes (Signed)
Pt roomed, medications, and allergies verified and updated by Angelita Ingles, MA-C  Angelita Ingles, 05/13/2012 8:39 AM  Vaccine Screening Questions      1.  Have you had a serious reaction or an allergic reaction to a vaccine?  NO    2.  Currently have a moderate or severe illness, including fever? (Don't Ask if vaccine   ordered by provider same day)  NO    3.  Ever had a seizure or a brain or other nervous system problem syndrome associated with a vaccine? (DTaP/TDaP/DTP pertinent) NO    4.  Is patient receiving  any live vaccinations today? (Varicella-Chickenpox, MMR-Measles/Mumps/Rubella, Zoster-Shingles)  YES - Additional Live Vaccine Questions:                       Have you received a live vaccine in the last 4 weeks?  NO    For women:  Are you pregnant or is there a chance you could become pregnant during the next month?  NO    Are you or any members of your household immune suppressed (steroid treatment, AIDS, cancer)?   YES:  partner    During the past year, have you received a transfusion of blood or blood products, or been given immune gamma globulin or an antiviral drug? NO        If YES to any of the questions above - Do NOT give vaccine.  Consult with RN or provider in clinic.  (#4 can be YES if all Live vaccine questions are answered NO)    If NO to all questions above - Patient may receive vaccine.    5.  Do you need to receive the Flu vaccine today? NO    Vaccine information sheet(s) discussed, patient/parent/guardian verbalized understanding? YES     VIS given 05/13/2012 by Angelita Ingles MA.

## 2012-05-22 ENCOUNTER — Other Ambulatory Visit (HOSPITAL_BASED_OUTPATIENT_CLINIC_OR_DEPARTMENT_OTHER): Payer: PRIVATE HEALTH INSURANCE

## 2012-05-22 DIAGNOSIS — K439 Ventral hernia without obstruction or gangrene: Secondary | ICD-10-CM

## 2012-05-22 LAB — PR US EXAM, PELVIC, LIMITED

## 2012-05-22 LAB — PR ULTRASOUND ABDOMINAL R-T W/IMAGE LIMITED

## 2012-05-23 ENCOUNTER — Telehealth (INDEPENDENT_AMBULATORY_CARE_PROVIDER_SITE_OTHER): Payer: Self-pay | Admitting: Family Practice

## 2012-05-23 NOTE — Telephone Encounter (Signed)
Called Ghada LMTCB

## 2012-05-23 NOTE — Telephone Encounter (Signed)
Please inform the patient of U/S results :    Abdomen : Small epigastric/supraumbilical ventral hernia measuring 1.0 cm with , this confirms that the mass on the abdomen is hernia . She does have referral surgery given at time of the office visit . Please instruct her to make an appt to evaluate this with a surgeon regarding treatment plan       Groin swelling : Normal exam , no enlarged lymph node   -  . Sonographically normal, non-enlarged left inguinal lymph nodes   corresponding to the palpable findings

## 2012-05-24 NOTE — Telephone Encounter (Signed)
Pt states she already knew results. She has an appt with the surgeon on 06/03/12.

## 2012-06-13 ENCOUNTER — Encounter: Payer: Self-pay | Admitting: Gastroenterology

## 2012-07-02 ENCOUNTER — Encounter (HOSPITAL_COMMUNITY): Payer: Self-pay

## 2012-07-02 ENCOUNTER — Emergency Department (HOSPITAL_COMMUNITY): Payer: 59

## 2012-07-02 ENCOUNTER — Inpatient Hospital Stay (HOSPITAL_COMMUNITY)
Admission: EM | Admit: 2012-07-02 | Discharge: 2012-07-05 | DRG: 419 | Disposition: A | Discharge status: Home / Self Care | Payer: 59 | Attending: General Surgery | Admitting: General Surgery

## 2012-07-02 DIAGNOSIS — K8020 Calculus of gallbladder without cholecystitis without obstruction: Secondary | ICD-10-CM

## 2012-07-02 DIAGNOSIS — K8071 Calculus of gallbladder and bile duct without cholecystitis with obstruction: Secondary | ICD-10-CM

## 2012-07-02 DIAGNOSIS — E785 Hyperlipidemia, unspecified: Secondary | ICD-10-CM | POA: Diagnosis present

## 2012-07-02 DIAGNOSIS — K8066 Calculus of gallbladder and bile duct with acute and chronic cholecystitis without obstruction: Secondary | ICD-10-CM

## 2012-07-02 DIAGNOSIS — F3289 Other specified depressive episodes: Secondary | ICD-10-CM | POA: Diagnosis present

## 2012-07-02 DIAGNOSIS — F411 Generalized anxiety disorder: Secondary | ICD-10-CM | POA: Diagnosis present

## 2012-07-02 DIAGNOSIS — F329 Major depressive disorder, single episode, unspecified: Secondary | ICD-10-CM | POA: Diagnosis present

## 2012-07-02 DIAGNOSIS — D72829 Elevated white blood cell count, unspecified: Secondary | ICD-10-CM

## 2012-07-02 DIAGNOSIS — K589 Irritable bowel syndrome without diarrhea: Secondary | ICD-10-CM | POA: Diagnosis present

## 2012-07-02 DIAGNOSIS — I10 Essential (primary) hypertension: Secondary | ICD-10-CM | POA: Diagnosis present

## 2012-07-02 DIAGNOSIS — K219 Gastro-esophageal reflux disease without esophagitis: Secondary | ICD-10-CM | POA: Diagnosis present

## 2012-07-02 DIAGNOSIS — K810 Acute cholecystitis: Principal | ICD-10-CM | POA: Diagnosis present

## 2012-07-02 LAB — COMPREHENSIVE METABOLIC PANEL
Alkaline Phosphatase: 85 U/L (ref 39–117)
BUN: 13 mg/dL (ref 6–23)
CO2: 24 mEq/L (ref 19–32)
Chloride: 99 mEq/L (ref 96–112)
GFR calc Af Amer: >90 mL/min (ref >90)
GFR calc non Af Amer: >90 mL/min (ref >90)
Glucose, Bld: 222 mg/dL — ABNORMAL HIGH (ref 70–99)
Potassium: 3.5 mEq/L (ref 3.5–5.1)
Total Bilirubin: 0.7 mg/dL (ref 0.3–1.2)

## 2012-07-02 LAB — CBC WITH DIFFERENTIAL/PLATELET
Eosinophils Absolute: 0 10*3/uL (ref 0.0–0.7)
Hemoglobin: 14.5 g/dL (ref 12.0–15.0)
Lymphs Abs: 1 10*3/uL (ref 0.7–4.0)
MCH: 29.1 pg (ref 26.0–34.0)
Monocytes Relative: 7 % (ref 3–12)
Neutro Abs: 13.6 10*3/uL — ABNORMAL HIGH (ref 1.7–7.7)
Neutrophils Relative %: 86 % — ABNORMAL HIGH (ref 43–77)
RBC: 4.99 MIL/uL (ref 3.87–5.11)

## 2012-07-02 LAB — POCT I-STAT TROPONIN I: Troponin i, poc: 0 ng/mL (ref 0.00–0.08)

## 2012-07-02 LAB — URINALYSIS, ROUTINE W REFLEX MICROSCOPIC
Glucose, UA: >1000 mg/dL — AB
Urobilinogen, UA: 0.2 mg/dL (ref 0.0–1.0)
pH: 5 (ref 5.0–8.0)

## 2012-07-02 LAB — URINE MICROSCOPIC-ADD ON

## 2012-07-02 LAB — LIPASE, BLOOD: Lipase: 14 U/L (ref 11–59)

## 2012-07-02 MED ORDER — HYDROMORPHONE HCL PF 1 MG/ML IJ SOLN
0.5000 mg | Freq: Once | INTRAMUSCULAR | Status: AC
  Administered 2012-07-02: 0.5 mg via INTRAVENOUS
  Filled 2012-07-02: qty 1

## 2012-07-02 MED ORDER — HYDROMORPHONE HCL PF 1 MG/ML IJ SOLN
1.0000 mg | INTRAMUSCULAR | Status: DC | PRN
  Administered 2012-07-03 – 2012-07-05 (×12): 1 mg via INTRAVENOUS
  Filled 2012-07-02 (×12): qty 1

## 2012-07-02 MED ORDER — DEXTROSE-NACL 5-0.9 % IV SOLN
INTRAVENOUS | Status: DC
  Administered 2012-07-03 – 2012-07-04 (×4): via INTRAVENOUS

## 2012-07-02 MED ORDER — ONDANSETRON HCL 4 MG/2ML IJ SOLN
4.0000 mg | Freq: Four times a day (QID) | INTRAMUSCULAR | Status: DC | PRN
  Administered 2012-07-03 – 2012-07-04 (×3): 4 mg via INTRAVENOUS
  Filled 2012-07-02 (×3): qty 2

## 2012-07-02 MED ORDER — SODIUM CHLORIDE 0.9 % IV SOLN
1.0000 g | INTRAVENOUS | Status: DC
  Administered 2012-07-03 – 2012-07-05 (×3): 1 g via INTRAVENOUS
  Filled 2012-07-02 (×3): qty 1

## 2012-07-02 MED ORDER — ONDANSETRON HCL 4 MG/2ML IJ SOLN
INTRAMUSCULAR | Status: AC
  Administered 2012-07-02: 4 mg
  Filled 2012-07-02: qty 2

## 2012-07-02 MED ORDER — SODIUM CHLORIDE 0.9 % IV SOLN: Freq: Once | INTRAVENOUS | Status: DC

## 2012-07-02 MED ORDER — ONDANSETRON 4 MG PO TBDP: 4.0000 mg | ORAL_TABLET | Freq: Once | ORAL | Status: DC

## 2012-07-02 MED ORDER — PANTOPRAZOLE SODIUM 40 MG IV SOLR
40.0000 mg | Freq: Every day | INTRAVENOUS | Status: DC
  Administered 2012-07-03 – 2012-07-04 (×2): 40 mg via INTRAVENOUS
  Filled 2012-07-02 (×4): qty 40

## 2012-07-02 NOTE — ED Provider Notes (Signed)
History     CSN: [id number]  Arrival date & time 07/02/12  1728   First MD Initiated Contact with Patient 07/02/12 1912      Chief Complaint  Patient presents with  . Abdominal Pain    (Consider location/radiation/quality/duration/timing/severity/associated sxs/prior treatment) HPI Catherine Mora is a 62 y.o. female with a history of cholelithiasis and GERD presents to the emergency department complaining of epigastric and right upper quadrant abdominal pain.  Onset of symptoms began last evening after eating dinner consisting of fried chicken.  Patient reports acute onset of severe pain followed by emesis x1.  Severity rated at 10/10.  No known exacerbating or alleviating factors.  When patient awoke this morning abdominal pain slightly subsided, however it is gradually been returning if headache continues.  Associated symptoms include anorexia.  Last meal was last evening.  Patient has a history of abdominal hysterectomy and appendectomy.  Last normal bowel movement was yesterday.  Patient denies any fevers, night sweats, chills, melena, hematemesis, hematochezia, chest pain, shortness of breath.  Past Medical History  Diagnosis Date  . Hyperlipidemia   . HTN (hypertension)   . Depression   . Glucose intolerance (impaired glucose tolerance)   . GERD (gastroesophageal reflux disease)   . Anxiety   . IBS (irritable bowel syndrome)   . Cholelithiasis 2011    Past Surgical History  Procedure Laterality Date  . Abdominal hysterectomy    . Oophorectomy      Family History  Problem Relation Age of Onset  . Heart disease Neg Hx   . Diabetes Other     History  Substance Use Topics  . Smoking status: Never Smoker   . Smokeless tobacco: Not on file  . Alcohol Use: Yes     Comment: very little    OB History   Grav Para Term Preterm Abortions TAB SAB Ect Mult Living                  Review of Systems  Ten systems reviewed and are negative for acute change, except as  noted in the HPI.    Allergies  Ciprofloxacin; Crestor; Hydrochlorothiazide; Paroxetine; Penicillins; and Simvastatin  Home Medications   Current Outpatient Rx  Name  Route  Sig  Dispense  Refill  . ALPRAZolam (XANAX) 1 MG tablet   Oral   Take 0.5-1 mg by mouth at bedtime as needed for sleep or anxiety.         Marland Kitchen aspirin 81 MG EC tablet   Oral   Take 81 mg by mouth daily.           . valsartan (DIOVAN) 160 MG tablet   Oral   Take 1 tablet (160 mg total) by mouth daily.   90 tablet   1     BP 134/87  Pulse 97  Temp(Src) 98.7 F (37.1 C) (Oral)  SpO2 94%  Physical Exam  Constitutional: She is oriented to person, place, and time. She appears well-developed and well-nourished. No distress.  HENT:  Head: Normocephalic and atraumatic.  Mouth/Throat: Oropharynx is clear and moist. No oropharyngeal exudate.  Eyes: Conjunctivae and EOM are normal. Pupils are equal, round, and reactive to light. No scleral icterus.  Neck: Normal range of motion. Neck supple. No tracheal deviation present. No thyromegaly present.  Cardiovascular: Normal rate, regular rhythm, normal heart sounds and intact distal pulses.   Pulmonary/Chest: Effort normal and breath sounds normal. No stridor. No respiratory distress. She has no wheezes.  Abdominal: Soft.  There is tenderness.    No peritoneal signs and is a  Musculoskeletal: Normal range of motion. She exhibits no edema and no tenderness.  Neurological: She is alert and oriented to person, place, and time. Coordination normal.  Skin: Skin is warm and dry. No rash noted. She is not diaphoretic. No erythema. No pallor.  Psychiatric: She has a normal mood and affect. Her behavior is normal.    ED Course  Procedures (including critical care time)  Labs Reviewed  CBC WITH DIFFERENTIAL - Abnormal; Notable for the following:    WBC 15.7 (*)    Neutrophils Relative % 86 (*)    Neutro Abs 13.6 (*)    Lymphocytes Relative 7 (*)    Monocytes  Absolute 1.1 (*)    All other components within normal limits  COMPREHENSIVE METABOLIC PANEL - Abnormal; Notable for the following:    Glucose, Bld 222 (*)    Albumin 3.4 (*)    All other components within normal limits  URINALYSIS, ROUTINE W REFLEX MICROSCOPIC - Abnormal; Notable for the following:    Color, Urine AMBER (*)    APPearance CLOUDY (*)    Specific Gravity, Urine 1.034 (*)    Glucose, UA >1000 (*)    Hgb urine dipstick TRACE (*)    Bilirubin Urine SMALL (*)    Ketones, ur 15 (*)    All other components within normal limits  URINE MICROSCOPIC-ADD ON - Abnormal; Notable for the following:    Squamous Epithelial / LPF FEW (*)    Bacteria, UA FEW (*)    All other components within normal limits  LIPASE, BLOOD  POCT I-STAT TROPONIN I   US Abdomen Complete  07/02/2012  *RADIOLOGY REPORT*  Clinical Data:  Right upper quadrant pain  ABDOMINAL ULTRASOUND COMPLETE  Comparison:  Renal ultrasound 03/19/2009  Findings:  Gallbladder:  There are multiple gallstones, the largest measuring approximately 1.5 cm.  There is a 1.3 cm gallstone within the gallbladder neck that does not definitely move during the examination.  Gallbladder wall thickness is normal, at 2 mm.  The sonographic Murphy's sign is negative.  Common Bile Duct:  Common bile duct measures between 8 to 12 mm. It measures 12 mm at the level of the pancreatic head. No filling defects are identified within the visualized portion of the common bile duct, but the entire duct is not visualized on ultrasound.  Liver: No focal mass lesion identified.  Within normal limits in parenchymal echogenicity.  IVC:  Appears normal.  Pancreas:  The visualized portion of the pancreas appears normal. Pancreas is obscured by bowel gas.  Spleen:  Within normal limits in size and echotexture.  Right kidney:  Normal in size and parenchymal echogenicity.  No evidence of mass or hydronephrosis.  Left kidney:  Normal in size and parenchymal echogenicity.  No  evidence of mass or hydronephrosis.  Abdominal Aorta:  No aneurysm identified.  IMPRESSION:  1.  Cholelithiasis.  Cannot exclude an impacted stone in the gallbladder neck. 2.  Gallbladder wall thickness is normal and the sonographic Murphy's sign is negative. 3.  Dilated common bile duct.  Bile duct obstruction cannot be excluded. A cause for the dilated common bile duct is not visualized on ultrasound.   Original Report Authenticated By: Britta Mccreedy, M.D.    Consult General Surgery: to see pt in ER.   1. Cholelithiasis   2. Leukocytosis   3. Gallbladder & bile duct stone with obstruction       MDM  Surgery to admit patient. BP 134/87  Pulse 97  Temp(Src) 98.7 F (37.1 C) (Oral)  SpO2 94% The patient appears reasonably stabilized for admission considering the current resources, flow, and capabilities available in the ED at this time, and I doubt any other [facility] requiring further screening and/or treatment in the ED prior to admission.        Jaci Carrel, [place] 07/02/12 2300

## 2012-07-02 NOTE — ED Provider Notes (Signed)
Medical screening examination/treatment/procedure(s) were conducted as a shared visit with non-physician practitioner(s) and myself.  I personally evaluated the patient during the encounter   Loren Racer, MD 07/02/12 2340

## 2012-07-02 NOTE — ED Notes (Addendum)
Pt c/o generalized abd pain, increase pain to RUQ, abd swelling, and vomiting x1 starting last night. Pt reports a hx of gall stones

## 2012-07-02 NOTE — H&P (Signed)
Catherine Mora is an 62 y.o. female.   Chief Complaint: Abdominal pain HPI: the patient is a 62 year old female approximately 24-hour history of right upper quadrant pain after a meal consisting of fried chicken. The patient's previously had undergone workup several years ago for possible symptomatic cholelithiasis.  The patient states she had some nausea without any emesis. She states the pain has not gotten any better since its onset.  Past Medical History  Diagnosis Date  . Hyperlipidemia   . HTN (hypertension)   . Depression   . Glucose intolerance (impaired glucose tolerance)   . GERD (gastroesophageal reflux disease)   . Anxiety   . IBS (irritable bowel syndrome)   . Cholelithiasis 2011    Past Surgical History  Procedure Laterality Date  . Abdominal hysterectomy    . Oophorectomy      Family History  Problem Relation Age of Onset  . Heart disease Neg Hx   . Diabetes Other    Social History:  reports that she has never smoked. She does not have any smokeless tobacco history on file. She reports that  drinks alcohol. Her drug history is not on file.  Allergies:  Allergies  Allergen Reactions  . Ciprofloxacin     REACTION: itching  . Crestor (Rosuvastatin Calcium)     arthralgia  . Hydrochlorothiazide     REACTION: hair loss  . Paroxetine     REACTION: living in glass  . Penicillins Hives  . Simvastatin     REACTION: aches with statins     (Not in a hospital admission)  Results for orders placed during the hospital encounter of 07/02/12 (from the past 48 hour(s))  CBC WITH DIFFERENTIAL     Status: Abnormal   Collection Time    07/02/12  5:42 PM      Result Value Range   WBC 15.7 (*) 4.0 - 10.5 K/uL   RBC 4.99  3.87 - 5.11 MIL/uL   Hemoglobin 14.5  12.0 - 15.0 g/dL   HCT 45.4  09.8 - 11.9 %   MCV 83.8  78.0 - 100.0 fL   MCH 29.1  26.0 - 34.0 pg   MCHC 34.7  30.0 - 36.0 g/dL   RDW 14.7  82.9 - 56.2 %   Platelets 261  150 - 400 K/uL   Neutrophils  Relative % 86 (*) 43 - 77 %   Neutro Abs 13.6 (*) 1.7 - 7.7 K/uL   Lymphocytes Relative 7 (*) 12 - 46 %   Lymphs Abs 1.0  0.7 - 4.0 K/uL   Monocytes Relative 7  3 - 12 %   Monocytes Absolute 1.1 (*) 0.1 - 1.0 K/uL   Eosinophils Relative 0  0 - 5 %   Eosinophils Absolute 0.0  0.0 - 0.7 K/uL   Basophils Relative 0  0 - 1 %   Basophils Absolute 0.0  0.0 - 0.1 K/uL  COMPREHENSIVE METABOLIC PANEL     Status: Abnormal   Collection Time    07/02/12  5:42 PM      Result Value Range   Sodium 136  135 - 145 mEq/L   Potassium 3.5  3.5 - 5.1 mEq/L   Chloride 99  96 - 112 mEq/L   CO2 24  19 - 32 mEq/L   Glucose, Bld 222 (*) 70 - 99 mg/dL   BUN 13  6 - 23 mg/dL   Creatinine, Ser 1.30  0.50 - 1.10 mg/dL   Calcium 9.4  8.4 -  10.5 mg/dL   Total Protein 7.7  6.0 - 8.3 g/dL   Albumin 3.4 (*) 3.5 - 5.2 g/dL   AST 28  0 - 37 U/L   ALT 26  0 - 35 U/L   Alkaline Phosphatase 85  39 - 117 U/L   Total Bilirubin 0.7  0.3 - 1.2 mg/dL   GFR calc non Af Amer >90  >90 mL/min   GFR calc Af Amer >90  >90 mL/min   Comment:            The eGFR has been calculated     using the CKD EPI equation.     This calculation has not been     validated in all clinical     situations.     eGFR's persistently     <90 mL/min signify     possible Chronic Kidney Disease.  LIPASE, BLOOD     Status: None   Collection Time    07/02/12  5:42 PM      Result Value Range   Lipase 14  11 - 59 U/L  POCT I-STAT TROPONIN I     Status: None   Collection Time    07/02/12  6:03 PM      Result Value Range   Troponin i, poc 0.00  0.00 - 0.08 ng/mL   Comment 3            Comment: Due to the release kinetics of cTnI,     a negative result within the first hours     of the onset of symptoms does not rule out     myocardial infarction with certainty.     If myocardial infarction is still suspected,     repeat the test at appropriate intervals.  URINALYSIS, ROUTINE W REFLEX MICROSCOPIC     Status: Abnormal   Collection Time     07/02/12  6:15 PM      Result Value Range   Color, Urine AMBER (*) YELLOW   Comment: BIOCHEMICALS MAY BE AFFECTED BY COLOR   APPearance CLOUDY (*) CLEAR   Specific Gravity, Urine 1.034 (*) 1.005 - 1.030   pH 5.0  5.0 - 8.0   Glucose, UA >1000 (*) NEGATIVE mg/dL   Hgb urine dipstick TRACE (*) NEGATIVE   Bilirubin Urine SMALL (*) NEGATIVE   Ketones, ur 15 (*) NEGATIVE mg/dL   Protein, ur NEGATIVE  NEGATIVE mg/dL   Urobilinogen, UA 0.2  0.0 - 1.0 mg/dL   Nitrite NEGATIVE  NEGATIVE   Leukocytes, UA NEGATIVE  NEGATIVE  URINE MICROSCOPIC-ADD ON     Status: Abnormal   Collection Time    07/02/12  6:15 PM      Result Value Range   Squamous Epithelial / LPF FEW (*) RARE   WBC, UA 0-2  <3 WBC/hpf   RBC / HPF 0-2  <3 RBC/hpf   Bacteria, UA FEW (*) RARE   Urine-Other MUCOUS PRESENT     Comment: AMORPHOUS URATES/PHOSPHATES   US Abdomen Complete  07/02/2012  *RADIOLOGY REPORT*  Clinical Data:  Right upper quadrant pain  ABDOMINAL ULTRASOUND COMPLETE  Comparison:  Renal ultrasound 03/19/2009  Findings:  Gallbladder:  There are multiple gallstones, the largest measuring approximately 1.5 cm.  There is a 1.3 cm gallstone within the gallbladder neck that does not definitely move during the examination.  Gallbladder wall thickness is normal, at 2 mm.  The sonographic Murphy's sign is negative.  Common Bile Duct:  Common bile duct measures between  8 to 12 mm. It measures 12 mm at the level of the pancreatic head. No filling defects are identified within the visualized portion of the common bile duct, but the entire duct is not visualized on ultrasound.  Liver: No focal mass lesion identified.  Within normal limits in parenchymal echogenicity.  IVC:  Appears normal.  Pancreas:  The visualized portion of the pancreas appears normal. Pancreas is obscured by bowel gas.  Spleen:  Within normal limits in size and echotexture.  Right kidney:  Normal in size and parenchymal echogenicity.  No evidence of mass or  hydronephrosis.  Left kidney:  Normal in size and parenchymal echogenicity.  No evidence of mass or hydronephrosis.  Abdominal Aorta:  No aneurysm identified.  IMPRESSION:  1.  Cholelithiasis.  Cannot exclude an impacted stone in the gallbladder neck. 2.  Gallbladder wall thickness is normal and the sonographic Murphy's sign is negative. 3.  Dilated common bile duct.  Bile duct obstruction cannot be excluded. A cause for the dilated common bile duct is not visualized on ultrasound.   Original Report Authenticated By: Britta Mccreedy, M.D.     Review of Systems  Constitutional: Negative for fever and chills.  HENT: Negative.   Eyes: Negative.   Respiratory: Negative.   Cardiovascular: Negative.   Gastrointestinal: Positive for nausea and abdominal pain. Negative for vomiting.  Genitourinary: Negative.   Musculoskeletal: Negative.   Neurological: Negative.     Blood pressure 134/87, pulse 97, temperature 98.7 F (37.1 C), temperature source Oral, SpO2 94.00%. Physical Exam  Constitutional: She is oriented to person, place, and time. She appears well-developed and well-nourished.  HENT:  Head: Normocephalic and atraumatic.  Eyes: Conjunctivae and EOM are normal. Pupils are equal, round, and reactive to light.  Neck: Normal range of motion. Neck supple.  Cardiovascular: Normal rate, regular rhythm and normal heart sounds.   Respiratory: Effort normal and breath sounds normal.  GI: Soft. Bowel sounds are normal. She exhibits no mass. There is tenderness (RUQ). There is no rebound and no guarding.  Musculoskeletal: Normal range of motion.  Neurological: She is alert and oriented to person, place, and time.     Assessment/Plan 62 year old female with likely acute cholecystitis. 1. We'll get the patient admitted to the floor 2. Will start patient on antibiotics 3. I discussed with her the possibility of laparoscopic cholecystectomy per Dr. Janee Morn. Also discussed the possibility that  secondary to her slightly enlarged common bile duct the patient may need a postoperative procedure to clear her common bile duct.  Catherine Ehlers., Daysean Tinkham 07/02/2012, 10:18 PM

## 2012-07-03 ENCOUNTER — Encounter (HOSPITAL_COMMUNITY): Payer: Self-pay | Admitting: Certified Registered"

## 2012-07-03 ENCOUNTER — Encounter (HOSPITAL_COMMUNITY): Admission: EM | Disposition: A | Discharge status: Home / Self Care | Payer: Self-pay | Source: Home / Self Care

## 2012-07-03 ENCOUNTER — Observation Stay (HOSPITAL_COMMUNITY): Payer: 59 | Admitting: Certified Registered"

## 2012-07-03 ENCOUNTER — Observation Stay (HOSPITAL_COMMUNITY): Payer: 59

## 2012-07-03 HISTORY — PX: CHOLECYSTECTOMY: SHX55

## 2012-07-03 SURGERY — LAPAROSCOPIC CHOLECYSTECTOMY WITH INTRAOPERATIVE CHOLANGIOGRAM
Anesthesia: General | Site: Abdomen | Wound class: Clean Contaminated

## 2012-07-03 MED ORDER — FENTANYL CITRATE 0.05 MG/ML IJ SOLN
INTRAMUSCULAR | Status: DC | PRN
  Administered 2012-07-03: 100 ug via INTRAVENOUS
  Administered 2012-07-03: 150 ug via INTRAVENOUS

## 2012-07-03 MED ORDER — HYDROMORPHONE HCL PF 1 MG/ML IJ SOLN
INTRAMUSCULAR | Status: AC
  Filled 2012-07-03: qty 1

## 2012-07-03 MED ORDER — LIDOCAINE HCL (CARDIAC) 20 MG/ML IV SOLN
INTRAVENOUS | Status: DC | PRN
  Administered 2012-07-03: 100 mg via INTRAVENOUS

## 2012-07-03 MED ORDER — ONDANSETRON HCL 4 MG/2ML IJ SOLN
INTRAMUSCULAR | Status: DC | PRN
  Administered 2012-07-03: 4 mg via INTRAVENOUS

## 2012-07-03 MED ORDER — ENOXAPARIN SODIUM 40 MG/0.4ML ~~LOC~~ SOLN
40.0000 mg | SUBCUTANEOUS | Status: DC
  Administered 2012-07-03 – 2012-07-04 (×2): 40 mg via SUBCUTANEOUS
  Filled 2012-07-03 (×3): qty 0.4

## 2012-07-03 MED ORDER — SODIUM CHLORIDE 0.9 % IR SOLN
Status: DC | PRN
  Administered 2012-07-03: 1000 mL

## 2012-07-03 MED ORDER — GLYCOPYRROLATE 0.2 MG/ML IJ SOLN
INTRAMUSCULAR | Status: DC | PRN
  Administered 2012-07-03: 0.6 mg via INTRAVENOUS

## 2012-07-03 MED ORDER — NEOSTIGMINE METHYLSULFATE 1 MG/ML IJ SOLN
INTRAMUSCULAR | Status: DC | PRN
  Administered 2012-07-03: 4 mg via INTRAVENOUS

## 2012-07-03 MED ORDER — ARTIFICIAL TEARS OP OINT
TOPICAL_OINTMENT | OPHTHALMIC | Status: DC | PRN
  Administered 2012-07-03: 1 via OPHTHALMIC

## 2012-07-03 MED ORDER — OXYCODONE HCL 5 MG/5ML PO SOLN: 5.0000 mg | Freq: Once | ORAL | Status: AC | PRN

## 2012-07-03 MED ORDER — LACTATED RINGERS IV SOLN
INTRAVENOUS | Status: DC
  Administered 2012-07-03: 17:00:00 via INTRAVENOUS

## 2012-07-03 MED ORDER — OXYCODONE HCL 5 MG PO TABS
ORAL_TABLET | ORAL | Status: AC
  Filled 2012-07-03: qty 1

## 2012-07-03 MED ORDER — BUPIVACAINE-EPINEPHRINE PF 0.25-1:200000 % IJ SOLN
INTRAMUSCULAR | Status: AC
  Filled 2012-07-03: qty 30

## 2012-07-03 MED ORDER — LACTATED RINGERS IV SOLN
INTRAVENOUS | Status: DC | PRN
  Administered 2012-07-03: 17:00:00 via INTRAVENOUS

## 2012-07-03 MED ORDER — MIDAZOLAM HCL 5 MG/5ML IJ SOLN
INTRAMUSCULAR | Status: DC | PRN
  Administered 2012-07-03: 2 mg via INTRAVENOUS

## 2012-07-03 MED ORDER — OXYCODONE HCL 5 MG PO TABS
5.0000 mg | ORAL_TABLET | Freq: Once | ORAL | Status: AC | PRN
  Administered 2012-07-03: 5 mg via ORAL

## 2012-07-03 MED ORDER — BUPIVACAINE-EPINEPHRINE 0.25% -1:200000 IJ SOLN
INTRAMUSCULAR | Status: DC | PRN
  Administered 2012-07-03: 17 mL

## 2012-07-03 MED ORDER — SODIUM CHLORIDE 0.9 % IV SOLN
INTRAVENOUS | Status: DC | PRN
  Administered 2012-07-03: 18:00:00

## 2012-07-03 MED ORDER — HYDROMORPHONE HCL PF 1 MG/ML IJ SOLN
0.2500 mg | INTRAMUSCULAR | Status: DC | PRN
  Administered 2012-07-03: 0.5 mg via INTRAVENOUS

## 2012-07-03 MED ORDER — PROMETHAZINE HCL 25 MG/ML IJ SOLN
6.5000 mg | Freq: Once | INTRAMUSCULAR | Status: AC
  Administered 2012-07-03: 6.5 mg via INTRAVENOUS
  Filled 2012-07-03: qty 1

## 2012-07-03 MED ORDER — SUCCINYLCHOLINE CHLORIDE 20 MG/ML IJ SOLN
INTRAMUSCULAR | Status: DC | PRN
  Administered 2012-07-03: 100 mg via INTRAVENOUS

## 2012-07-03 MED ORDER — PROMETHAZINE HCL 25 MG/ML IJ SOLN: 6.2500 mg | INTRAMUSCULAR | Status: DC | PRN

## 2012-07-03 MED ORDER — OXYCODONE HCL 5 MG PO TABS
5.0000 mg | ORAL_TABLET | ORAL | Status: DC | PRN
  Administered 2012-07-04: 5 mg via ORAL
  Filled 2012-07-03: qty 2

## 2012-07-03 MED ORDER — ROCURONIUM BROMIDE 100 MG/10ML IV SOLN
INTRAVENOUS | Status: DC | PRN
  Administered 2012-07-03: 5 mg via INTRAVENOUS
  Administered 2012-07-03: 30 mg via INTRAVENOUS

## 2012-07-03 MED ORDER — ALPRAZOLAM 0.5 MG PO TABS: 0.5000 mg | ORAL_TABLET | Freq: Every evening | ORAL | Status: DC | PRN

## 2012-07-03 MED ORDER — PROPOFOL 10 MG/ML IV BOLUS
INTRAVENOUS | Status: DC | PRN
  Administered 2012-07-03: 150 mg via INTRAVENOUS

## 2012-07-03 SURGICAL SUPPLY — 47 items
ADH SKN CLS APL DERMABOND .7 (GAUZE/BANDAGES/DRESSINGS) ×1
APL SKNCLS STERI-STRIP NONHPOA (GAUZE/BANDAGES/DRESSINGS) ×1
APPLIER CLIP 5 13 M/L LIGAMAX5 (MISCELLANEOUS) ×2
APPLIER CLIP ROT 10 11.4 M/L (STAPLE)
APR CLP MED LRG 11.4X10 (STAPLE)
APR CLP MED LRG 5 ANG JAW (MISCELLANEOUS) ×1
BAG SPEC RTRVL LRG 6X4 10 (ENDOMECHANICALS) ×1
BENZOIN TINCTURE PRP APPL 2/3 (GAUZE/BANDAGES/DRESSINGS) ×1 IMPLANT
BLADE SURG ROTATE 9660 (MISCELLANEOUS) ×1 IMPLANT
CANISTER SUCTION 2500CC (MISCELLANEOUS) ×2 IMPLANT
CHLORAPREP W/TINT 26ML (MISCELLANEOUS) ×2 IMPLANT
CLIP APPLIE 5 13 M/L LIGAMAX5 (MISCELLANEOUS) ×1 IMPLANT
CLIP APPLIE ROT 10 11.4 M/L (STAPLE) IMPLANT
CLOTH BEACON ORANGE TIMEOUT ST (SAFETY) ×2 IMPLANT
CLSR STERI-STRIP ANTIMIC 1/2X4 (GAUZE/BANDAGES/DRESSINGS) ×1 IMPLANT
COVER MAYO STAND STRL (DRAPES) ×2 IMPLANT
COVER SURGICAL LIGHT HANDLE (MISCELLANEOUS) ×2 IMPLANT
DECANTER SPIKE VIAL GLASS SM (MISCELLANEOUS) ×4 IMPLANT
DERMABOND ADVANCED (GAUZE/BANDAGES/DRESSINGS) ×1
DERMABOND ADVANCED .7 DNX12 (GAUZE/BANDAGES/DRESSINGS) ×1 IMPLANT
DRAPE C-ARM 42X72 X-RAY (DRAPES) ×2 IMPLANT
DRAPE UTILITY 15X26 W/TAPE STR (DRAPE) ×4 IMPLANT
ELECT REM PT RETURN 9FT ADLT (ELECTROSURGICAL) ×2
ELECTRODE REM PT RTRN 9FT ADLT (ELECTROSURGICAL) ×1 IMPLANT
FILTER SMOKE EVAC LAPAROSHD (FILTER) IMPLANT
GLOVE BIO SURGEON STRL SZ8 (GLOVE) ×2 IMPLANT
GLOVE BIOGEL PI IND STRL 8 (GLOVE) ×1 IMPLANT
GLOVE BIOGEL PI INDICATOR 8 (GLOVE) ×1
GOWN PREVENTION PLUS XLARGE (GOWN DISPOSABLE) ×2 IMPLANT
GOWN STRL NON-REIN LRG LVL3 (GOWN DISPOSABLE) ×6 IMPLANT
KIT BASIN OR (CUSTOM PROCEDURE TRAY) ×2 IMPLANT
KIT ROOM TURNOVER OR (KITS) ×2 IMPLANT
NS IRRIG 1000ML POUR BTL (IV SOLUTION) ×2 IMPLANT
PAD ARMBOARD 7.5X6 YLW CONV (MISCELLANEOUS) ×2 IMPLANT
POUCH SPECIMEN RETRIEVAL 10MM (ENDOMECHANICALS) ×2 IMPLANT
SCISSORS LAP 5X35 DISP (ENDOMECHANICALS) IMPLANT
SET CHOLANGIOGRAPH 5 50 .035 (SET/KITS/TRAYS/PACK) ×2 IMPLANT
SET IRRIG TUBING LAPAROSCOPIC (IRRIGATION / IRRIGATOR) ×2 IMPLANT
SLEEVE ENDOPATH XCEL 5M (ENDOMECHANICALS) ×4 IMPLANT
SPECIMEN JAR SMALL (MISCELLANEOUS) ×2 IMPLANT
SUT VIC AB 4-0 PS2 27 (SUTURE) ×2 IMPLANT
TOWEL OR 17X24 6PK STRL BLUE (TOWEL DISPOSABLE) ×2 IMPLANT
TOWEL OR 17X26 10 PK STRL BLUE (TOWEL DISPOSABLE) ×2 IMPLANT
TRAY LAPAROSCOPIC (CUSTOM PROCEDURE TRAY) ×2 IMPLANT
TROCAR XCEL BLUNT TIP 100MML (ENDOMECHANICALS) ×2 IMPLANT
TROCAR XCEL NON-BLD 5MMX100MML (ENDOMECHANICALS) ×2 IMPLANT
WATER STERILE IRR 1000ML POUR (IV SOLUTION) IMPLANT

## 2012-07-03 NOTE — Progress Notes (Signed)
   CARE MANAGEMENT NOTE 07/03/2012  Patient:  Catherine Mora, Catherine Mora   Account Number:  [id number]  Date Initiated:  07/03/2012  Documentation initiated by:  Jiles Crocker  Subjective/Objective Assessment:   ADMITTED FOR SURGERY - acute cholecystitis.     Action/Plan:   PCP IS DR PLOTNIKOV; CM FOLLOWING FOR DCP   Anticipated DC Date:  07/04/2012   Anticipated DC Plan:  HOME/SELF CARE      DC Planning Services  CM consult       Status of service:  In process, will continue to follow Medicare Important Message given?  NA - LOS <3 / Initial given by admissions (If response is "NO", the following Medicare IM given date fields will be blank) Per UR Regulation:  Reviewed for med. necessity/level of care/duration of stay Comments:  07/03/2012- B Ashiya Kinkead RN,BSN,MHA

## 2012-07-03 NOTE — Transfer of Care (Signed)
Immediate Anesthesia Transfer of Care Note  Patient: Catherine Mora  Procedure(s) Performed: Procedure(s): LAPAROSCOPIC CHOLECYSTECTOMY WITH INTRAOPERATIVE CHOLANGIOGRAM (N/A)  Patient Location: PACU  Anesthesia Type:General  Level of Consciousness: awake, alert  and oriented  Airway & Oxygen Therapy: Patient Spontanous Breathing and Patient connected to nasal cannula oxygen  Post-op Assessment: Report given to PACU RN  Post vital signs: Reviewed and stable  Complications: No apparent anesthesia complications

## 2012-07-03 NOTE — Anesthesia Postprocedure Evaluation (Signed)
Anesthesia Post Note  Patient: Catherine Mora  Procedure(s) Performed: Procedure(s) (LRB): LAPAROSCOPIC CHOLECYSTECTOMY WITH INTRAOPERATIVE CHOLANGIOGRAM (N/A)  Anesthesia type: General  Patient location: PACU  Post pain: Pain level controlled  Post assessment: Patient's Cardiovascular Status Stable  Last Vitals:  Filed Vitals:   07/03/12 1910  BP: 131/76  Pulse: 77  Temp: 37.2 C  Resp: 17    Post vital signs: Reviewed and stable  Level of consciousness: alert  Complications: No apparent anesthesia complications

## 2012-07-03 NOTE — Op Note (Signed)
07/02/2012 - 07/03/2012  6:33 PM  PATIENT:  Catherine Mora  62 y.o. female  PRE-OPERATIVE DIAGNOSIS:  cholecystitis  POST-OPERATIVE DIAGNOSIS:  cholecystitis  PROCEDURE:  Procedure(s): LAPAROSCOPIC CHOLECYSTECTOMY  SURGEON:  Violeta Gelinas, MD  PHYSICIAN ASSISTANT:   ASSISTANTS: Marcille Blanco, MD   ANESTHESIA:   local and general  EBL:     BLOOD ADMINISTERED:none  DRAINS: none   SPECIMEN:  Excision  DISPOSITION OF SPECIMEN:  PATHOLOGY  COUNTS:  YES  DICTATION: .Dragon Dictation  The patient was admitted earlier this morning with acute cholecystitis. She is brought to the operating room for cholecystectomy. She was identified in the preop holding area. She is receiving intravenous antibiotics on protocol. She was brought to the operating room. General endotracheal anesthesia was administered by the anesthesia staff. Abdomen was prepped and draped in sterile fashion. We did a time out procedure. Due to umbilical scarring Supraumbilical region was infiltrated with quarter percent Marcaine and epinephrine. Supraumbilical incision was made. Subcutaneous tissues were dissected down revealing the anterior fascia. This was divided along the midline. Peritoneal cavity was entered under direct vision. 0 Vicryl pursestring suture was placed on the fascial opening. Hassan trocar was inserted. Abdomen was insufflated with carbon dioxide in standard fashion. Under direct vision a 5 mm epigastric and 5 mm right lateral ports x2 were placed. Local was used at each port site. Laparoscopic exploration revealed a extremely inflamed acute cholecystitis wrapped in omentum. Omentum was gently peeled away. The gallbladder was tense and distended. It was drained with a Nahzat drainage system. Bile was very pale consistent with early hydrops. Abdomen the gallbladder was retracted superior medially. Infundibulum was retracted inferior laterally. Dissection began laterally and progressed medially. Anterior branch  of the cystic artery was identified overlying the cystic duct. Anterior branch of the cystic artery was dissected, clipped twice proximally once distally and divided. Posterior branch of the cystic artery was very close him was also clipped twice proximally and divided distally with cautery.Next the cystic duct was dissected. Dissection continued until critical view was obtained between cystic duct, the gallbladder, and liver. Once we had excellent visualization decision was made to forego cholangiogram due to normal anatomy and liver function tests. One clip was placed distally on the infundibular cystic duct junction. 3 clips were placed proximal the cystic duct and it was divided. Gallbladder was taken off the liver bed with Bovie cautery. The liver bed was cauterized to get excellent hemostasis. Gallbladder was placed in an Endo Catch bag and removed from the abdomen via the supraumbilical port site. Liver bed was copiously irrigated. Cautery was used at good hemostasis. All clips remain in good position. Irrigation fluid returned clear. Ports were removed under direct vision. Pneumoperitoneum was released. Supraumbilical fascia was closed by tying the 0 Vicryl pursestring suture with care not to trap any intra-abdominal contents. All 4 wounds were copiously irrigated and the skin of each was closed with running 4 Vicryl followed by Dermabond. All counts were correct. There were no apparent complications. Patient was taken recovery in stable condition.  PATIENT DISPOSITION:  PACU - hemodynamically stable.   Delay start of Pharmacological VTE agent (>24hrs) due to surgical blood loss or risk of bleeding:  no  Violeta Gelinas, MD, MPH, FACS Pager: [phone]  5/14/20146:33 PM

## 2012-07-03 NOTE — Anesthesia Preprocedure Evaluation (Addendum)
Anesthesia Evaluation  Patient identified by MRN, date of birth, ID band Patient awake    Reviewed: Allergy & Precautions, H&P , NPO status , Patient's Chart, lab work & pertinent test results  History of Anesthesia Complications Negative for: history of anesthetic complications  Airway Mallampati: II TM Distance: >3 FB Neck ROM: Full    Dental  (+) Teeth Intact and Dental Advisory Given   Pulmonary neg pulmonary ROS,    Pulmonary exam normal       Cardiovascular hypertension, Pt. on medications     Neuro/Psych PSYCHIATRIC DISORDERS Anxiety Depression negative neurological ROS     GI/Hepatic Neg liver ROS, GERD-  Medicated,  Endo/Other  negative endocrine ROS  Renal/GU negative Renal ROS     Musculoskeletal   Abdominal   Peds  Hematology   Anesthesia Other Findings   Reproductive/Obstetrics                          Anesthesia Physical Anesthesia Plan  ASA: II  Anesthesia Plan: General   Post-op Pain Management:    Induction: Intravenous  Airway Management Planned: Oral ETT  Additional Equipment:   Intra-op Plan:   Post-operative Plan: Extubation in OR  Informed Consent:   Dental advisory given  Plan Discussed with: CRNA, Anesthesiologist and Surgeon  Anesthesia Plan Comments:        Anesthesia Quick Evaluation

## 2012-07-03 NOTE — Anesthesia Procedure Notes (Signed)
Procedure Name: Intubation Date/Time: 07/03/2012 5:18 PM Performed by: Jefm Miles E Pre-anesthesia Checklist: Patient identified, Timeout performed, Emergency Drugs available, Suction available and Patient being monitored Patient Re-evaluated:Patient Re-evaluated prior to inductionOxygen Delivery Method: Circle system utilized Preoxygenation: Pre-oxygenation with 100% oxygen Intubation Type: IV induction, Cricoid Pressure applied and Rapid sequence Laryngoscope Size: Mac and 3 Grade View: Grade II Tube type: Oral Tube size: 7.5 mm Number of attempts: 1 Airway Equipment and Method: Stylet Placement Confirmation: ETT inserted through vocal cords under direct vision,  breath sounds checked- equal and bilateral and positive ETCO2 Secured at: 21 cm Tube secured with: Tape Dental Injury: Teeth and Oropharynx as per pre-operative assessment

## 2012-07-03 NOTE — Preoperative (Signed)
Beta Blockers   Reason not to administer Beta Blockers:Not Applicable 

## 2012-07-03 NOTE — Progress Notes (Signed)
Patient admitted to room 6730. Patient alert and oriented x4. Patient complains of RUQ abdominal pain 5/10. Patient oriented to unit. Bed in lowest position. Call bell within reach. Will continue to monitor.

## 2012-07-03 NOTE — Progress Notes (Signed)
Inpatient Diabetes Program Recommendations  AACE/ADA: New Consensus Statement on Inpatient Glycemic Control (2013)  Target Ranges:  Prepandial:   less than 140 mg/dL      Peak postprandial:   less than 180 mg/dL (1-2 hours)      Critically ill patients:  140 - 180 mg/dL      Results for Catherine Mora, Catherine Mora (MRN [id number]) as of 07/03/2012 11:14  Ref. Range 07/02/2012 17:42  Glucose Latest Range: 70-99 mg/dL 409 (H)    Elevated lab glucose yesterday.  Per H&P, patient has a history of "impaired glucose tolerance".  MD- Please check CBGs Q4 hours and cover with Novolog Sensitive correction scale (SSI) if CBGs elevated MD- Please consider checking current Hemoglobin A1c level for this patient   Will follow. Ambrose Finland RN, MSN, CDE Diabetes Coordinator Inpatient Diabetes Program [phone]

## 2012-07-03 NOTE — Progress Notes (Signed)
Subjective: RUQ pain only marginally improved  Objective: Vital signs in last 24 hours: Temp:  [97.9 F (36.6 C)-100 F (37.8 C)] 100 F (37.8 C) (05/14 0501) Pulse Rate:  [96-103] 103 (05/14 0501) Resp:  [16-18] 16 (05/14 0501) BP: (109-137)/(63-87) 124/63 mmHg (05/14 0501) SpO2:  [92 %-98 %] 92 % (05/14 0501) Weight:  [86.138 kg (189 lb 14.4 oz)] 86.138 kg (189 lb 14.4 oz) (05/14 0000) Last BM Date: 07/02/12  Intake/Output from previous day: 05/13 0701 - 05/14 0700 In: 608.3 [I.V.:608.3] Out: -  Intake/Output this shift:    General appearance: alert and cooperative Resp: clear to auscultation bilaterally Cardio: regular rate and rhythm GI: soft, tender RUQ, no generalized tenderness Ext: calves soft  Lab Results:   Recent Labs  07/02/12 1742  WBC 15.7*  HGB 14.5  HCT 41.8  PLT 261   BMET  Recent Labs  07/02/12 1742  NA 136  K 3.5  CL 99  CO2 24  GLUCOSE 222*  BUN 13  CREATININE 0.59  CALCIUM 9.4   PT/INR No results found for this basename: LABPROT, INR,  in the last 72 hours ABG No results found for this basename: PHART, PCO2, PO2, HCO3,  in the last 72 hours  Studies/Results: US Abdomen Complete  07/02/2012   *RADIOLOGY REPORT*  Clinical Data:  Right upper quadrant pain  ABDOMINAL ULTRASOUND COMPLETE  Comparison:  Renal ultrasound 03/19/2009  Findings:  Gallbladder:  There are multiple gallstones, the largest measuring approximately 1.5 cm.  There is a 1.3 cm gallstone within the gallbladder neck that does not definitely move during the examination.  Gallbladder wall thickness is normal, at 2 mm.  The sonographic Murphy's sign is negative.  Common Bile Duct:  Common bile duct measures between 8 to 12 mm. It measures 12 mm at the level of the pancreatic head. No filling defects are identified within the visualized portion of the common bile duct, but the entire duct is not visualized on ultrasound.  Liver: No focal mass lesion identified.  Within  normal limits in parenchymal echogenicity.  IVC:  Appears normal.  Pancreas:  The visualized portion of the pancreas appears normal. Pancreas is obscured by bowel gas.  Spleen:  Within normal limits in size and echotexture.  Right kidney:  Normal in size and parenchymal echogenicity.  No evidence of mass or hydronephrosis.  Left kidney:  Normal in size and parenchymal echogenicity.  No evidence of mass or hydronephrosis.  Abdominal Aorta:  No aneurysm identified.  IMPRESSION:  1.  Cholelithiasis.  Cannot exclude an impacted stone in the gallbladder neck. 2.  Gallbladder wall thickness is normal and the sonographic Murphy's sign is negative. 3.  Dilated common bile duct.  Bile duct obstruction cannot be excluded. A cause for the dilated common bile duct is not visualized on ultrasound.   Original Report Authenticated By: Britta Mccreedy, M.D.    Anti-infectives: Anti-infectives   Start     Dose/Rate Route Frequency Ordered Stop   07/03/12 0000  ertapenem (INVANZ) 1 g in sodium chloride 0.9 % 50 mL IVPB     1 g 100 mL/hr over 30 Minutes Intravenous Every 24 hours 07/02/12 2237        Assessment/Plan: Cholecystitis - for lap chole IOC today. I discussed the procedure in detail.   We discussed the risks and benefits of a laparoscopic cholecystectomy and possible cholangiogram including, but not limited to bleeding, infection, injury to surrounding structures such as the intestine or liver, bile leak, retained  gallstones, need to convert to an open procedure, prolonged diarrhea, blood clots such as  DVT, common bile duct injury, anesthesia risks, and possible need for additional procedures.  The likelihood of improvement in symptoms and return to the patient's normal status is good. We discussed the typical post-operative recovery course. She is agreeable.  Continue ABX, NPO   LOS: 1 day    Abrian Hanover E 07/03/2012

## 2012-07-04 MED ORDER — PROMETHAZINE HCL 25 MG/ML IJ SOLN
12.5000 mg | INTRAMUSCULAR | Status: DC | PRN
  Administered 2012-07-04 – 2012-07-05 (×4): 12.5 mg via INTRAVENOUS
  Filled 2012-07-04 (×4): qty 1

## 2012-07-04 NOTE — Progress Notes (Signed)
1 Day Post-Op  Subjective: Pt states she is very nauseated and just received Zofran before we entered the room.  Denies vomiting.   Objective: Vital signs in last 24 hours: Temp:  [97.3 F (36.3 C)-99.4 F (37.4 C)] 98.8 F (37.1 C) (05/15 0601) Pulse Rate:  [75-105] 98 (05/15 0601) Resp:  [8-25] 25 (05/15 0601) BP: (111-139)/(53-84) 111/68 mmHg (05/15 0601) SpO2:  [89 %-100 %] 92 % (05/15 0601) Last BM Date: 07/02/12  Intake/Output from previous day: 05/14 0701 - 05/15 0700 In: 700 [I.V.:700] Out: -  Intake/Output this shift:    PE: Gen:  Alert, NAD, pleasant Card:  RRR, no M/G/R heard Pulm:  CTA, no W/R/R Abd: Soft, appropriately tender around her incision sites, ND, +BS, incisions C/D/I  Lab Results:   Recent Labs  07/02/12 1742  WBC 15.7*  HGB 14.5  HCT 41.8  PLT 261   BMET  Recent Labs  07/02/12 1742  NA 136  K 3.5  CL 99  CO2 24  GLUCOSE 222*  BUN 13  CREATININE 0.59  CALCIUM 9.4   PT/INR No results found for this basename: LABPROT, INR,  in the last 72 hours CMP     Component Value Date/Time   NA 136 07/02/2012 1742   K 3.5 07/02/2012 1742   CL 99 07/02/2012 1742   CO2 24 07/02/2012 1742   GLUCOSE 222* 07/02/2012 1742   BUN 13 07/02/2012 1742   CREATININE 0.59 07/02/2012 1742   CALCIUM 9.4 07/02/2012 1742   PROT 7.7 07/02/2012 1742   ALBUMIN 3.4* 07/02/2012 1742   AST 28 07/02/2012 1742   ALT 26 07/02/2012 1742   ALKPHOS 85 07/02/2012 1742   BILITOT 0.7 07/02/2012 1742   GFRNONAA >90 07/02/2012 1742   GFRAA >90 07/02/2012 1742   Lipase     Component Value Date/Time   LIPASE 14 07/02/2012 1742       Studies/Results: Dg Chest 2 View  07/03/2012   *RADIOLOGY REPORT*  Clinical Data: Preop for gallbladder surgery.  CHEST - 2 VIEW  Comparison: 03/19/2009  Findings: Lateral view degraded by patient arm position.  Midline trachea.  Normal heart size and mediastinal contours.  Mild right hemidiaphragm elevation.  Trace right-sided pleural fluid or  thickening on the lateral view.  Patchy areas of volume loss of subsegmental atelectasis at the lung bases.  IMPRESSION: No acute cardiopulmonary disease.  Trace left-sided pleural fluid or thickening, only apparent on the lateral view.   Original Report Authenticated By: Jeronimo Greaves, M.D.   US Abdomen Complete  07/02/2012   *RADIOLOGY REPORT*  Clinical Data:  Right upper quadrant pain  ABDOMINAL ULTRASOUND COMPLETE  Comparison:  Renal ultrasound 03/19/2009  Findings:  Gallbladder:  There are multiple gallstones, the largest measuring approximately 1.5 cm.  There is a 1.3 cm gallstone within the gallbladder neck that does not definitely move during the examination.  Gallbladder wall thickness is normal, at 2 mm.  The sonographic Murphy's sign is negative.  Common Bile Duct:  Common bile duct measures between 8 to 12 mm. It measures 12 mm at the level of the pancreatic head. No filling defects are identified within the visualized portion of the common bile duct, but the entire duct is not visualized on ultrasound.  Liver: No focal mass lesion identified.  Within normal limits in parenchymal echogenicity.  IVC:  Appears normal.  Pancreas:  The visualized portion of the pancreas appears normal. Pancreas is obscured by bowel gas.  Spleen:  Within normal limits  in size and echotexture.  Right kidney:  Normal in size and parenchymal echogenicity.  No evidence of mass or hydronephrosis.  Left kidney:  Normal in size and parenchymal echogenicity.  No evidence of mass or hydronephrosis.  Abdominal Aorta:  No aneurysm identified.  IMPRESSION:  1.  Cholelithiasis.  Cannot exclude an impacted stone in the gallbladder neck. 2.  Gallbladder wall thickness is normal and the sonographic Murphy's sign is negative. 3.  Dilated common bile duct.  Bile duct obstruction cannot be excluded. A cause for the dilated common bile duct is not visualized on ultrasound.   Original Report Authenticated By: Britta Mccreedy, M.D.     Anti-infectives: Anti-infectives   Start     Dose/Rate Route Frequency Ordered Stop   07/03/12 0000  ertapenem (INVANZ) 1 g in sodium chloride 0.9 % 50 mL IVPB     1 g 100 mL/hr over 30 Minutes Intravenous Every 24 hours 07/02/12 2237         Assessment/Plan 1. Cholecystitis, S/P lap chole - Cont clear liquid diet, advance as tolerated.  If she is able to tolerate, she may be discharged this afternoon. - Cont antiemetics PRN.  Once nausea is under control, she may be discharged to home. - Cont lovenox, SCDs, ambulation in the halls if she feels up to it for VTE prophylaxis. - Cont pain medication PRN.  LOS: 2 days    Cecile Hearing 07/04/2012, 9:05 AM [phone]

## 2012-07-04 NOTE — Progress Notes (Signed)
A little better after phenergan Hopefully can D/C in AM Tolerating clears now Patient examined and I agree with the assessment and plan  Violeta Gelinas, MD, MPH, FACS Pager: [phone]  07/04/2012 2:37 PM

## 2012-07-05 ENCOUNTER — Encounter (HOSPITAL_COMMUNITY): Payer: Self-pay | Admitting: General Surgery

## 2012-07-05 MED ORDER — OXYCODONE HCL 5 MG PO TABS: 5.0000 mg | ORAL_TABLET | ORAL | Status: DC | PRN

## 2012-07-05 MED ORDER — OXYCODONE HCL 5 MG PO TABS
5.0000 mg | ORAL_TABLET | ORAL | Status: DC | PRN
  Administered 2012-07-05: 10 mg via ORAL
  Filled 2012-07-05: qty 2

## 2012-07-05 NOTE — Discharge Summary (Signed)
Catherine Labriola, MD, MPH, FACS Pager: [phone]  

## 2012-07-05 NOTE — Discharge Summary (Signed)
Patient ID: Catherine Mora MRN: [id number] DOB/AGE: January 18, 1951 62 y.o.  Admit date: 07/02/2012 Discharge date: 07/05/2012  Procedures: laparoscopic cholecystectomy  Consults: None  Reason for Admission: the patient is a 62 year old female approximately 24-hour history of right upper quadrant pain after a meal consisting of fried chicken. The patient's previously had undergone workup several years ago for possible symptomatic cholelithiasis. The patient states she had some nausea without any emesis. She states the pain has not gotten any better since its onset.   Admission Diagnoses:  1. Acute cholecystitis Patient Active Problem List   Diagnosis Date Noted  . HTN (hypertension) 10/03/2011  . Anxiety 10/03/2011  . Sleep disturbance 10/03/2011  . Well adult exam 05/24/2010  . CHOLELITHIASIS 05/26/2009  . HAIR LOSS 03/12/2009  . DIZZINESS 06/30/2008  . GLUCOSE INTOLERANCE 05/08/2007  . ANXIETY 05/08/2007  . GERD 05/08/2007  . IBS 05/08/2007  . FATIGUE 05/08/2007  . CHEST PAIN 05/08/2007  . HYPERLIPIDEMIA 09/15/2006  . DEPRESSION 09/15/2006  . HYPERTENSION 09/15/2006  . ABNORMAL GLUCOSE NEC 09/15/2006    Hospital Course: The patient was admitted and taken to the operating room the follow day.  She underwent a lap chole which revealed acute cholecystitis.  On POD1, she was nauseated and unable to have her diet advanced.  By POD#2 though, she was feeling better and her diet was able to be advanced as tolerated and her pain was controlled with oral pain medication.  She was stable for dc home at this time.  PE: Abd: soft, appropriately tender, +BS, ND, incisions c/d/i  Discharge Diagnoses:  1. Acute cholecystitis, s/p lap chole Patient Active Problem List   Diagnosis Date Noted  . HTN (hypertension) 10/03/2011  . Anxiety 10/03/2011  . Sleep disturbance 10/03/2011  . Well adult exam 05/24/2010  . CHOLELITHIASIS 05/26/2009  . HAIR LOSS 03/12/2009  . DIZZINESS 06/30/2008  .  GLUCOSE INTOLERANCE 05/08/2007  . ANXIETY 05/08/2007  . GERD 05/08/2007  . IBS 05/08/2007  . FATIGUE 05/08/2007  . CHEST PAIN 05/08/2007  . HYPERLIPIDEMIA 09/15/2006  . DEPRESSION 09/15/2006  . HYPERTENSION 09/15/2006  . ABNORMAL GLUCOSE NEC 09/15/2006     Discharge Medications:   Medication List    TAKE these medications       ALPRAZolam 1 MG tablet  Commonly known as:  XANAX  Take 0.5-1 mg by mouth at bedtime as needed for sleep or anxiety.     aspirin 81 MG EC tablet  Take 81 mg by mouth daily.     oxyCODONE 5 MG immediate release tablet  Commonly known as:  Oxy IR/ROXICODONE  Take 1-2 tablets (5-10 mg total) by mouth every 4 (four) hours as needed (pain).     valsartan 160 MG tablet  Commonly known as:  DIOVAN  Take 1 tablet (160 mg total) by mouth daily.        Discharge Instructions:     Follow-up Information   Follow up with [facility] On 07/23/2012. (11:30am, arrive at 11:00am)    Contact information:   [address]   [place] [address] [phone]       Signed: Letha Cape 07/05/2012, 8:17 AM

## 2012-07-05 NOTE — Progress Notes (Signed)
Patient discharge Home per Md order.  Discharge instructions reviewed with patient and family.  Copies of all forms given and explained. Patient/family voiced understanding of all instructions.  Discharge in no acute distress. 

## 2012-07-23 ENCOUNTER — Encounter (INDEPENDENT_AMBULATORY_CARE_PROVIDER_SITE_OTHER): Payer: 59

## 2012-08-13 ENCOUNTER — Other Ambulatory Visit: Payer: Self-pay | Admitting: Family Medicine

## 2012-08-13 ENCOUNTER — Encounter (INDEPENDENT_AMBULATORY_CARE_PROVIDER_SITE_OTHER): Payer: Self-pay | Admitting: Internal Medicine

## 2012-08-13 ENCOUNTER — Ambulatory Visit (INDEPENDENT_AMBULATORY_CARE_PROVIDER_SITE_OTHER): Payer: 59 | Admitting: Internal Medicine

## 2012-08-13 VITALS — BP 118/80 | HR 74 | Temp 98.6°F | Resp 16 | Ht 70.0 in | Wt 186.6 lb

## 2012-08-13 DIAGNOSIS — K810 Acute cholecystitis: Secondary | ICD-10-CM

## 2012-08-13 NOTE — Patient Instructions (Signed)
May resume regular activity without restrictions. Follow up as needed. Call with questions or concerns.  

## 2012-08-13 NOTE — Progress Notes (Signed)
  Subjective: Pt returns to the clinic today after undergoing laparoscopic cholecystectomy on 07/03/12 by Dr. Janee Morn.  The patient is tolerating their diet well and is having no severe pain.  Bowel function is good.  No problems with the wounds.  Objective: Vital signs in last 24 hours: Reviewed  PE: Abd: soft, non-tender, +bs, incisions well healed  Lab Results:  No results found for this basename: WBC, HGB, HCT, PLT,  in the last 72 hours BMET No results found for this basename: NA, K, CL, CO2, GLUCOSE, BUN, CREATININE, CALCIUM,  in the last 72 hours PT/INR No results found for this basename: LABPROT, INR,  in the last 72 hours CMP     Component Value Date/Time   NA 136 07/02/2012 1742   K 3.5 07/02/2012 1742   CL 99 07/02/2012 1742   CO2 24 07/02/2012 1742   GLUCOSE 222* 07/02/2012 1742   BUN 13 07/02/2012 1742   CREATININE 0.59 07/02/2012 1742   CALCIUM 9.4 07/02/2012 1742   PROT 7.7 07/02/2012 1742   ALBUMIN 3.4* 07/02/2012 1742   AST 28 07/02/2012 1742   ALT 26 07/02/2012 1742   ALKPHOS 85 07/02/2012 1742   BILITOT 0.7 07/02/2012 1742   GFRNONAA >90 07/02/2012 1742   GFRAA >90 07/02/2012 1742   Lipase     Component Value Date/Time   LIPASE 14 07/02/2012 1742       Studies/Results: No results found.  Anti-infectives: Anti-infectives   None       Assessment/Plan  1.  S/P Laparoscopic Cholecystectomy: doing well, may resume regular activity without restrictions, Pt will follow up with [place] PRN and knows to call with questions or concerns.     Catherine Mora 08/13/2012

## 2012-08-16 ENCOUNTER — Ambulatory Visit (INDEPENDENT_AMBULATORY_CARE_PROVIDER_SITE_OTHER): Payer: 59 | Admitting: Emergency Medicine

## 2012-08-16 ENCOUNTER — Telehealth: Payer: Self-pay

## 2012-08-16 VITALS — BP 100/70 | HR 71 | Temp 98.1°F | Resp 16 | Ht 69.0 in | Wt 186.0 lb

## 2012-08-16 DIAGNOSIS — F411 Generalized anxiety disorder: Secondary | ICD-10-CM

## 2012-08-16 DIAGNOSIS — I10 Essential (primary) hypertension: Secondary | ICD-10-CM

## 2012-08-16 DIAGNOSIS — F419 Anxiety disorder, unspecified: Secondary | ICD-10-CM

## 2012-08-16 DIAGNOSIS — E782 Mixed hyperlipidemia: Secondary | ICD-10-CM

## 2012-08-16 DIAGNOSIS — G479 Sleep disorder, unspecified: Secondary | ICD-10-CM

## 2012-08-16 LAB — TSH: TSH: 1.815 u[IU]/mL (ref 0.350–4.500)

## 2012-08-16 LAB — COMPREHENSIVE METABOLIC PANEL
Albumin: 4 g/dL (ref 3.5–5.2)
Alkaline Phosphatase: 87 U/L (ref 39–117)
BUN: 13 mg/dL (ref 6–23)
Calcium: 9 mg/dL (ref 8.4–10.5)
Chloride: 101 mEq/L (ref 96–112)
Glucose, Bld: 159 mg/dL — ABNORMAL HIGH (ref 70–99)
Potassium: 4.3 mEq/L (ref 3.5–5.3)
Sodium: 136 mEq/L (ref 135–145)
Total Protein: 7.3 g/dL (ref 6.0–8.3)

## 2012-08-16 LAB — POCT UA - MICROSCOPIC ONLY: Mucus, UA: NEGATIVE

## 2012-08-16 LAB — POCT URINALYSIS DIPSTICK
Bilirubin, UA: NEGATIVE
Glucose, UA: 250
Ketones, UA: NEGATIVE
Spec Grav, UA: 1.025

## 2012-08-16 LAB — POCT CBC
Granulocyte percent: 54.9 %G (ref 37–80)
Hemoglobin: 13.4 g/dL (ref 12.2–16.2)
MCH, POC: 28.8 pg (ref 27–31.2)
MID (cbc): 0.4 (ref 0–0.9)
MPV: 8.7 fL (ref 0–99.8)
POC MID %: 7.3 %M (ref 0–12)
Platelet Count, POC: 306 10*3/uL (ref 142–424)
RBC: 4.65 M/uL (ref 4.04–5.48)
WBC: 5.5 10*3/uL (ref 4.6–10.2)

## 2012-08-16 LAB — LIPID PANEL
HDL: 46 mg/dL (ref >39)
LDL Cholesterol: 172 mg/dL — ABNORMAL HIGH (ref 0–99)
Triglycerides: 313 mg/dL — ABNORMAL HIGH (ref <150)

## 2012-08-16 MED ORDER — ALPRAZOLAM 1 MG PO TABS: 0.5000 mg | ORAL_TABLET | Freq: Every evening | ORAL | Status: DC | PRN

## 2012-08-16 MED ORDER — VALSARTAN 160 MG PO TABS: 160.0000 mg | ORAL_TABLET | Freq: Every day | ORAL | Status: DC

## 2012-08-16 MED ORDER — FEXOFENADINE HCL 180 MG PO TABS: 180.0000 mg | ORAL_TABLET | Freq: Every day | ORAL | Status: DC

## 2012-08-16 NOTE — Progress Notes (Signed)
[facility] [address], [place] [address] [phone]  Date:  08/16/2012   Name:  Catherine Mora   DOB:  [birth year]   MRN:  [id number]  PCP:  Sonda Primes, MD    Chief Complaint: Medication Refill, Sinusitis and Nevus   History of Present Illness:  Catherine Mora is a 62 y.o. very pleasant female patient who presents with the following:  History of hypertension and hyperlipidemia.  Has come in for refills on her medications.  She denies adverse effects of medications.  No end organ injury. Says that she has become hoarse and has sore throat associated with post nasal drainage.  Says is mucopurulent in nature.  No fever or chills.  Non productive infrequent cough.   Never smoked.     Patient Active Problem List   Diagnosis Date Noted  . HTN (hypertension) 10/03/2011  . Anxiety 10/03/2011  . Sleep disturbance 10/03/2011  . Well adult exam 05/24/2010  . CHOLELITHIASIS 05/26/2009  . HAIR LOSS 03/12/2009  . DIZZINESS 06/30/2008  . GLUCOSE INTOLERANCE 05/08/2007  . ANXIETY 05/08/2007  . GERD 05/08/2007  . IBS 05/08/2007  . FATIGUE 05/08/2007  . CHEST PAIN 05/08/2007  . HYPERLIPIDEMIA 09/15/2006  . DEPRESSION 09/15/2006  . HYPERTENSION 09/15/2006  . ABNORMAL GLUCOSE NEC 09/15/2006    Past Medical History  Diagnosis Date  . Hyperlipidemia   . HTN (hypertension)   . Depression   . Glucose intolerance (impaired glucose tolerance)   . GERD (gastroesophageal reflux disease)   . Anxiety   . IBS (irritable bowel syndrome)   . Cholelithiasis 2011    Past Surgical History  Procedure Laterality Date  . Abdominal hysterectomy    . Oophorectomy    . Cholecystectomy N/A 07/03/2012    Procedure: LAPAROSCOPIC CHOLECYSTECTOMY WITH INTRAOPERATIVE CHOLANGIOGRAM;  Surgeon: Liz Malady, MD;  Location: [facility] OR;  Service: General;  Laterality: N/A;    History  Substance Use Topics  . Smoking status: Never Smoker   . Smokeless tobacco: Never Used   . Alcohol Use: Yes     Comment: very little    Family History  Problem Relation Age of Onset  . Heart disease Neg Hx   . Diabetes Other     Allergies  Allergen Reactions  . Ciprofloxacin     REACTION: itching  . Crestor (Rosuvastatin Calcium)     arthralgia  . Hydrochlorothiazide     REACTION: hair loss  . Paroxetine     REACTION: living in glass  . Penicillins Hives  . Simvastatin     REACTION: aches with statins    Medication list has been reviewed and updated.  Current Outpatient Prescriptions on File Prior to Visit  Medication Sig Dispense Refill  . ALPRAZolam (XANAX) 1 MG tablet Take 0.5-1 mg by mouth at bedtime as needed for sleep or anxiety.      Marland Kitchen aspirin 81 MG EC tablet Take 81 mg by mouth daily.        . valsartan (DIOVAN) 160 MG tablet Take 1 tablet (160 mg total) by mouth daily.  90 tablet  1   No current facility-administered medications on file prior to visit.    Review of Systems:  As per HPI, otherwise negative.    Physical Examination: Filed Vitals:   08/16/12 1017  BP: 100/70  Pulse: 71  Temp: 98.1 F (36.7 C)  Resp: 16   Filed Vitals:   08/16/12 1017  Height: 5\' 9"  (1.753 m)  Weight: 186 lb (84.369 kg)   Body mass index is 27.45 kg/(m^2). Ideal Body Weight: Weight in (lb) to have BMI = 25: 168.9  GEN: WDWN, NAD, Non-toxic, A & O x 3 HEENT: Atraumatic, Normocephalic. Neck supple. No masses, No LAD. Ears and Nose: No external deformity. CV: RRR, No M/G/R. No JVD. No thrill. No extra heart sounds. PULM: CTA B, no wheezes, crackles, rhonchi. No retractions. No resp. distress. No accessory muscle use. ABD: S, NT, ND, +BS. No rebound. No HSM. EXTR: No c/c/e NEURO Normal gait.  PSYCH: Normally interactive. Conversant. Not depressed or anxious appearing.  Calm demeanor.    Assessment and Plan: Seasonal allergies Hypertension Anxiety Labs and follow up based on  Signed,  Phillips Odor, MD

## 2012-08-16 NOTE — Patient Instructions (Signed)

## 2012-08-16 NOTE — Telephone Encounter (Signed)
Patient has come in requesting refills / advised office visit needed.

## 2012-08-26 ENCOUNTER — Ambulatory Visit (INDEPENDENT_AMBULATORY_CARE_PROVIDER_SITE_OTHER): Payer: PRIVATE HEALTH INSURANCE | Admitting: Family Practice

## 2012-08-26 ENCOUNTER — Encounter (INDEPENDENT_AMBULATORY_CARE_PROVIDER_SITE_OTHER): Payer: Self-pay | Admitting: Family Practice

## 2012-08-26 VITALS — BP 110/73 | HR 61 | Temp 97.9°F | Wt 115.0 lb

## 2012-08-26 DIAGNOSIS — M549 Dorsalgia, unspecified: Secondary | ICD-10-CM

## 2012-08-26 DIAGNOSIS — K148 Other diseases of tongue: Secondary | ICD-10-CM

## 2012-08-26 MED ORDER — HYDROCODONE-ACETAMINOPHEN 5-325 MG OR TABS
ORAL_TABLET | ORAL | Status: DC
Start: 2012-08-26 — End: 2013-02-03

## 2012-08-26 NOTE — Patient Instructions (Addendum)
It was a pleasure to see you in clinic today. Your Medical Assistant was: Levin Erp can schedule an appointment to see [place] by calling [phone] or via eCare.     If labs were ordered today the results are expected to be available via eCare 5 days later. Otherwise, result letters are mailed 7-10 days after your tests are completed. If your physician needs to change your care based on your results, you will receive a phone call to notify you. If you haven't heard from him/her and it has been more than 10 days please give [place] a call.     Thank you for choosing [facility].     A/P  (724.5) Back pain  (primary encounter diagnosis)  Plan:  This is a chronic condition - today her pain has improved  most likely musculoskeletal strain with no signs or symptoms of neurological compromise.   Discussed diagnosis and management in details. Rest until pain improved. PAin meds   NSAIDs and then gentle ROM exercises and advance as tolerated was discussed   Plan: refills completed and form completed   Hydrocodone-Acetaminophen 5-325 MG Oral Tab

## 2012-08-26 NOTE — Progress Notes (Signed)
CC/HPI : lower back pain       Medication and allergy reviewed and updated as appropriate     HPI: Paula Bowman is a 62 year old female with lower back pain . Chronic . For the past  40 years . She has been dealing with this intermittently . She has been taking motrin and flexeril  Her back pain is exacerbated by minor daily activities  At time . She is very physically active . She would like a form filled for work .  Pain : 10/10 at worse , 5/10 . Pain : spasm . Non radiating - lasting for 1 week or less . No surgical intervention .    Review of Systems   Constitutional: Negative for fever, appetite change and fatigue.   Genitourinary: Negative for difficulty urinating.   Musculoskeletal: Positive for arthralgias and gait problem. Negative for myalgias and joint swelling.   Neurological: Negative for dizziness.       History   Substance Use Topics   . Smoking status: Former Smoker -- 2.00 packs/day for 15 years     Types: Cigarettes     Quit date: 01/07/1988   . Smokeless tobacco: Not on file   . Alcohol Use: 0.5 oz/week     1 drink(s) per week      Comment: 2 glasses of wine daily     Patient Active Problem List   Diagnosis   . PERS HX OF BREAST MALIGNANCY   . Anxiety state, unspecified   . Other chronic allergic conjunctivitis   . GERD (gastroesophageal reflux disease)   . Eczema   . Allergic rhinitis   . Onychomycosis     Objective: BP 110/73  Pulse 61  Temp(Src) 97.9 F (36.6 C) (Temporal)  Wt 115 lb (52.164 kg)  BMI 17.74 kg/m2  SpO2 100%     Physical Exam   Constitutional: She is well-developed, well-nourished, and in no distress. No distress.   HENT:   Mouth/Throat: Oropharynx is clear and moist. No oropharyngeal exudate.   Some whitish - flesh color discoloration on the left lateral tongue area    Musculoskeletal: Normal range of motion. She exhibits no edema and no tenderness.     Back exam:  -- Inspection: No  deformity,  asymmetry, bruising,  atrophy, No  paraspinous muscle spasm along the  C/T/L/S spine  -- ROM:   -- Special tests:  Normal  Ipsilateral supine straight leg raise   And Contralateral supine straight leg raise  -- Palpation:  No tenderness along the spinous processes of the C/T/L/S spine.  No  paraspinous muscle spasm noted.  -- Neurovascular exam: Sensation intact. Distal pulses intact.   Skin: Skin is warm and dry. She is not diaphoretic.   Psychiatric: Mood and affect normal.     A/P  (724.5) Back pain  (primary encounter diagnosis)  Plan:  This is a chronic condition - today her pain has improved  most likely musculoskeletal strain with no signs or symptoms of neurological compromise.   Discussed diagnosis and management in details. Rest until pain improved. PAin meds,  NSAIDs and then gentle ROM exercises and advance as tolerated was discussed   Plan: refills completed and form completed   Hydrocodone-Acetaminophen 5-325 MG Oral Tab

## 2012-09-03 ENCOUNTER — Encounter (INDEPENDENT_AMBULATORY_CARE_PROVIDER_SITE_OTHER): Payer: Self-pay | Admitting: Internal Medicine

## 2012-10-03 ENCOUNTER — Other Ambulatory Visit: Payer: Self-pay | Admitting: *Deleted

## 2012-10-03 ENCOUNTER — Other Ambulatory Visit (INDEPENDENT_AMBULATORY_CARE_PROVIDER_SITE_OTHER): Payer: 59

## 2012-10-03 DIAGNOSIS — Z0000 Encounter for general adult medical examination without abnormal findings: Secondary | ICD-10-CM

## 2012-10-03 DIAGNOSIS — E782 Mixed hyperlipidemia: Secondary | ICD-10-CM

## 2012-10-03 DIAGNOSIS — I10 Essential (primary) hypertension: Secondary | ICD-10-CM

## 2012-10-03 LAB — HEPATIC FUNCTION PANEL
AST: 38 U/L — ABNORMAL HIGH (ref 0–37)
Alkaline Phosphatase: 85 U/L (ref 39–117)
Bilirubin, Direct: 0.1 mg/dL (ref 0.0–0.3)
Total Bilirubin: 0.6 mg/dL (ref 0.3–1.2)

## 2012-10-03 LAB — BASIC METABOLIC PANEL
Calcium: 8.6 mg/dL (ref 8.4–10.5)
GFR: 101.61 mL/min (ref >60.00)
Potassium: 4.2 mEq/L (ref 3.5–5.1)
Sodium: 135 mEq/L (ref 135–145)

## 2012-10-03 LAB — CBC WITH DIFFERENTIAL/PLATELET
Basophils Relative: 0.8 % (ref 0.0–3.0)
Eosinophils Relative: 4.9 % (ref 0.0–5.0)
Hemoglobin: 13.9 g/dL (ref 12.0–15.0)
Lymphocytes Relative: 29.8 % (ref 12.0–46.0)
MCHC: 33.7 g/dL (ref 30.0–36.0)
MCV: 86.2 fl (ref 78.0–100.0)
Neutro Abs: 4 10*3/uL (ref 1.4–7.7)
Neutrophils Relative %: 58.2 % (ref 43.0–77.0)
RBC: 4.8 Mil/uL (ref 3.87–5.11)
WBC: 6.8 10*3/uL (ref 4.5–10.5)

## 2012-10-03 LAB — URINALYSIS, ROUTINE W REFLEX MICROSCOPIC
Bilirubin Urine: NEGATIVE
Ketones, ur: NEGATIVE
Specific Gravity, Urine: >=1.03 (ref 1.000–1.030)
Urine Glucose: NEGATIVE
Urobilinogen, UA: 0.2 (ref 0.0–1.0)

## 2012-10-03 LAB — LDL CHOLESTEROL, DIRECT: Direct LDL: 207.3 mg/dL

## 2012-10-03 LAB — LIPID PANEL
HDL: 41.6 mg/dL (ref >39.00)
Total CHOL/HDL Ratio: 7
Triglycerides: 197 mg/dL — ABNORMAL HIGH (ref 0.0–149.0)

## 2012-10-07 ENCOUNTER — Telehealth: Payer: Self-pay | Admitting: *Deleted

## 2012-10-07 NOTE — Telephone Encounter (Signed)
Message copied by Merrilyn Puma on Mon Oct 07, 2012 12:53 PM ------      Message from: Livingston Diones      Created: Thu Sep 26, 2012  2:12 PM       Pt has an cpe appt 10/10/12. Please put in lab work a week prior.  ------

## 2012-10-07 NOTE — Telephone Encounter (Signed)
CPE labs done.

## 2012-10-10 ENCOUNTER — Ambulatory Visit (INDEPENDENT_AMBULATORY_CARE_PROVIDER_SITE_OTHER): Payer: 59 | Admitting: Internal Medicine

## 2012-10-10 ENCOUNTER — Other Ambulatory Visit (INDEPENDENT_AMBULATORY_CARE_PROVIDER_SITE_OTHER): Payer: 59

## 2012-10-10 ENCOUNTER — Encounter: Payer: Self-pay | Admitting: Gastroenterology

## 2012-10-10 ENCOUNTER — Encounter: Payer: Self-pay | Admitting: Internal Medicine

## 2012-10-10 VITALS — BP 138/80 | HR 72 | Temp 97.9°F | Ht 70.0 in | Wt 188.0 lb

## 2012-10-10 DIAGNOSIS — F329 Major depressive disorder, single episode, unspecified: Secondary | ICD-10-CM

## 2012-10-10 DIAGNOSIS — L659 Nonscarring hair loss, unspecified: Secondary | ICD-10-CM

## 2012-10-10 DIAGNOSIS — Z23 Encounter for immunization: Secondary | ICD-10-CM

## 2012-10-10 DIAGNOSIS — Z0000 Encounter for general adult medical examination without abnormal findings: Secondary | ICD-10-CM

## 2012-10-10 DIAGNOSIS — Z1211 Encounter for screening for malignant neoplasm of colon: Secondary | ICD-10-CM

## 2012-10-10 DIAGNOSIS — I10 Essential (primary) hypertension: Secondary | ICD-10-CM

## 2012-10-10 DIAGNOSIS — E785 Hyperlipidemia, unspecified: Secondary | ICD-10-CM

## 2012-10-10 DIAGNOSIS — F411 Generalized anxiety disorder: Secondary | ICD-10-CM

## 2012-10-10 DIAGNOSIS — R7309 Other abnormal glucose: Secondary | ICD-10-CM

## 2012-10-10 LAB — VITAMIN B12: Vitamin B-12: 294 pg/mL (ref 211–911)

## 2012-10-10 LAB — HEMOGLOBIN A1C: Hgb A1c MFr Bld: 7.5 % — ABNORMAL HIGH (ref 4.6–6.5)

## 2012-10-10 MED ORDER — VITAMIN D 1000 UNITS PO TABS: 1000.0000 [IU] | ORAL_TABLET | Freq: Every day | ORAL | Status: DC

## 2012-10-10 MED ORDER — ALPRAZOLAM 1 MG PO TABS: 0.5000 mg | ORAL_TABLET | Freq: Two times a day (BID) | ORAL | Status: DC | PRN

## 2012-10-10 MED ORDER — COLESEVELAM HCL 3.75 G PO PACK: PACK | ORAL | Status: DC

## 2012-10-10 MED ORDER — LOSARTAN POTASSIUM 100 MG PO TABS: 100.0000 mg | ORAL_TABLET | Freq: Every day | ORAL | Status: DC

## 2012-10-10 NOTE — Assessment & Plan Note (Signed)
Worse Xanax prn 

## 2012-10-10 NOTE — Assessment & Plan Note (Signed)
Labs Rogain, Biotin

## 2012-10-10 NOTE — Assessment & Plan Note (Signed)
We discussed age appropriate health related issues, including available/recomended screening tests and vaccinations. We discussed a need for adhering to healthy diet and exercise. Labs/EKG were reviewed/ordered. All questions were answered. Ophth, PAP q 12 mo Zostavax suggested

## 2012-10-10 NOTE — Assessment & Plan Note (Signed)
NMR Other options discussed

## 2012-10-10 NOTE — Progress Notes (Signed)
   Subjective:   HPI The patient is here for a wellness exam. The patient had a GB removed recently. C/o hair loss following surgery. C/o stress - husband was dx'd /throat ca in 4/14.  The patient needs to address her chronic hypertension that has been well controlled with medicines; to address chronic hair loss - worse. F/u elevated glucose.    Review of Systems  Constitutional: Negative.  Negative for fever, chills, diaphoresis, activity change, appetite change, fatigue and unexpected weight change.  HENT: Negative for hearing loss, ear pain, nosebleeds, congestion, sore throat, facial swelling, rhinorrhea, sneezing, mouth sores, trouble swallowing, neck pain, neck stiffness, postnasal drip, sinus pressure and tinnitus.   Eyes: Negative for pain, discharge, redness, itching and visual disturbance.  Respiratory: Negative for cough, chest tightness, shortness of breath, wheezing and stridor.   Cardiovascular: Negative for chest pain, palpitations and leg swelling.  Gastrointestinal: Negative for nausea, diarrhea, constipation, blood in stool, abdominal distention, anal bleeding and rectal pain.  Genitourinary: Negative for dysuria, urgency, frequency, hematuria, flank pain, vaginal bleeding, vaginal discharge, difficulty urinating, genital sores and pelvic pain.  Musculoskeletal: Negative for back pain, joint swelling, arthralgias and gait problem.  Skin: Negative.  Negative for rash.  Neurological: Negative for dizziness, tremors, seizures, syncope, speech difficulty, weakness, numbness and headaches.  Hematological: Negative for adenopathy. Does not bruise/bleed easily.  Psychiatric/Behavioral: Negative for suicidal ideas, behavioral problems, sleep disturbance, dysphoric mood and decreased concentration. The patient is not nervous/anxious.        Objective:   Physical Exam  Constitutional: She appears well-developed and well-nourished. No distress.  HENT:  Head: Normocephalic.   Right Ear: External ear normal.  Left Ear: External ear normal.  Nose: Nose normal.  Mouth/Throat: Oropharynx is clear and moist.  Eyes: Conjunctivae are normal. Pupils are equal, round, and reactive to light. Right eye exhibits no discharge. Left eye exhibits no discharge.  Neck: Normal range of motion. Neck supple. No JVD present. No tracheal deviation present. No thyromegaly present.  Cardiovascular: Normal rate, regular rhythm and normal heart sounds.   Pulmonary/Chest: No stridor. No respiratory distress. She has no wheezes.  Abdominal: Soft. Bowel sounds are normal. She exhibits no distension and no mass. There is no tenderness. There is no rebound and no guarding.  Musculoskeletal: She exhibits no edema and no tenderness.  Lymphadenopathy:    She has no cervical adenopathy.  Neurological: She displays normal reflexes. No cranial nerve deficit. She exhibits normal muscle tone. Coordination normal.  Skin: No rash noted. No erythema.  Psychiatric: She has a normal mood and affect. Her behavior is normal. Judgment and thought content normal.        Lab Results  Component Value Date   WBC 6.8 10/03/2012   HGB 13.9 10/03/2012   HCT 41.3 10/03/2012   PLT 287.0 10/03/2012   CHOL 289* 10/03/2012   TRIG 197.0* 10/03/2012   HDL 41.60 10/03/2012   LDLDIRECT 207.3 10/03/2012   ALT 46* 10/03/2012   AST 38* 10/03/2012   NA 135 10/03/2012   K 4.2 10/03/2012   CL 101 10/03/2012   CREATININE 0.6 10/03/2012   BUN 14 10/03/2012   CO2 27 10/03/2012   TSH 2.82 10/03/2012   HGBA1C 6.7* 03/15/2009     Assessment & Plan:   .

## 2012-10-10 NOTE — Assessment & Plan Note (Signed)
Diovan is too $$$ Start Losartan

## 2012-10-10 NOTE — Assessment & Plan Note (Signed)
Labs A1c Welchol

## 2012-10-10 NOTE — Assessment & Plan Note (Signed)
[facility] declined Rx, counseling

## 2012-10-11 ENCOUNTER — Other Ambulatory Visit: Payer: Self-pay | Admitting: Internal Medicine

## 2012-10-11 LAB — VITAMIN D 25 HYDROXY (VIT D DEFICIENCY, FRACTURES): Vit D, 25-Hydroxy: 34 ng/mL (ref 30–89)

## 2012-10-11 MED ORDER — VITAMIN D 1000 UNITS PO TABS: 2000.0000 [IU] | ORAL_TABLET | Freq: Every day | ORAL | Status: AC

## 2012-10-11 MED ORDER — VITAMIN B-12 500 MCG SL SUBL: SUBLINGUAL_TABLET | SUBLINGUAL | Status: DC

## 2012-11-07 ENCOUNTER — Ambulatory Visit: Payer: 59 | Admitting: Internal Medicine

## 2012-11-18 ENCOUNTER — Encounter (INDEPENDENT_AMBULATORY_CARE_PROVIDER_SITE_OTHER): Payer: Self-pay

## 2012-12-03 ENCOUNTER — Ambulatory Visit (INDEPENDENT_AMBULATORY_CARE_PROVIDER_SITE_OTHER): Payer: 59 | Admitting: Internal Medicine

## 2012-12-03 ENCOUNTER — Other Ambulatory Visit (INDEPENDENT_AMBULATORY_CARE_PROVIDER_SITE_OTHER): Payer: 59

## 2012-12-03 ENCOUNTER — Encounter: Payer: Self-pay | Admitting: Internal Medicine

## 2012-12-03 VITALS — BP 146/92 | HR 84 | Temp 98.5°F | Resp 16 | Wt 191.0 lb

## 2012-12-03 DIAGNOSIS — J069 Acute upper respiratory infection, unspecified: Secondary | ICD-10-CM

## 2012-12-03 DIAGNOSIS — R599 Enlarged lymph nodes, unspecified: Secondary | ICD-10-CM

## 2012-12-03 DIAGNOSIS — L509 Urticaria, unspecified: Secondary | ICD-10-CM

## 2012-12-03 DIAGNOSIS — R590 Localized enlarged lymph nodes: Secondary | ICD-10-CM | POA: Insufficient documentation

## 2012-12-03 LAB — CBC WITH DIFFERENTIAL/PLATELET
Basophils Relative: 0.8 % (ref 0.0–3.0)
Eosinophils Relative: 3.5 % (ref 0.0–5.0)
HCT: 39 % (ref 36.0–46.0)
Hemoglobin: 13.4 g/dL (ref 12.0–15.0)
Lymphs Abs: 1.8 10*3/uL (ref 0.7–4.0)
MCHC: 34.2 g/dL (ref 30.0–36.0)
MCV: 85.4 fl (ref 78.0–100.0)
Monocytes Absolute: 0.5 10*3/uL (ref 0.1–1.0)
Neutro Abs: 3.8 10*3/uL (ref 1.4–7.7)
Neutrophils Relative %: 59.8 % (ref 43.0–77.0)
RBC: 4.57 Mil/uL (ref 3.87–5.11)
WBC: 6.4 10*3/uL (ref 4.5–10.5)

## 2012-12-03 LAB — HEPATIC FUNCTION PANEL
AST: 49 U/L — ABNORMAL HIGH (ref 0–37)
Bilirubin, Direct: 0.1 mg/dL (ref 0.0–0.3)
Total Bilirubin: 0.4 mg/dL (ref 0.3–1.2)

## 2012-12-03 MED ORDER — PROMETHAZINE-CODEINE 6.25-10 MG/5ML PO SYRP: 5.0000 mL | ORAL_SOLUTION | ORAL | Status: DC | PRN

## 2012-12-03 MED ORDER — AZITHROMYCIN 250 MG PO TABS: ORAL_TABLET | ORAL | Status: DC

## 2012-12-03 NOTE — Assessment & Plan Note (Signed)
Claritin D prn 

## 2012-12-03 NOTE — Assessment & Plan Note (Signed)
Strep Mono/CBC Z pac

## 2012-12-03 NOTE — Assessment & Plan Note (Signed)
Labs Z pac

## 2012-12-03 NOTE — Progress Notes (Signed)
Subjective:   Cough This is a new problem. The current episode started in the past 7 days. The problem has been gradually worsening. The problem occurs every few minutes. The cough is productive of purulent sputum. Pertinent negatives include no chest pain, chills, ear pain, eye redness, fever, headaches, postnasal drip, rash, rhinorrhea, sore throat, shortness of breath or wheezing.  Rash This is a new problem. The current episode started yesterday. The affected locations include the chest. The rash is characterized by itchiness and redness (hive). She was exposed to nothing. Associated symptoms include congestion and coughing. Pertinent negatives include no diarrhea, eye pain, fatigue, fever, rhinorrhea, shortness of breath or sore throat.    The patient needs to address her chronic hypertension that has been well controlled with medicines; to address chronic hair loss - worse. F/u elevated glucose.    Review of Systems  Constitutional: Negative.  Negative for fever, chills, diaphoresis, activity change, appetite change, fatigue and unexpected weight change.  HENT: Positive for congestion. Negative for ear pain, facial swelling, hearing loss, mouth sores, nosebleeds, postnasal drip, rhinorrhea, sinus pressure, sneezing, sore throat, tinnitus and trouble swallowing.   Eyes: Negative for pain, discharge, redness, itching and visual disturbance.  Respiratory: Positive for cough. Negative for chest tightness, shortness of breath, wheezing and stridor.   Cardiovascular: Negative for chest pain, palpitations and leg swelling.  Gastrointestinal: Negative for nausea, diarrhea, constipation, blood in stool, abdominal distention, anal bleeding and rectal pain.  Genitourinary: Negative for dysuria, urgency, frequency, hematuria, flank pain, vaginal bleeding, vaginal discharge, difficulty urinating, genital sores and pelvic pain.  Musculoskeletal: Negative for arthralgias, back pain, gait problem,  joint swelling, neck pain and neck stiffness.  Skin: Negative.  Negative for rash.  Neurological: Negative for dizziness, tremors, seizures, syncope, speech difficulty, weakness, numbness and headaches.  Hematological: Negative for adenopathy. Does not bruise/bleed easily.  Psychiatric/Behavioral: Negative for suicidal ideas, behavioral problems, sleep disturbance, dysphoric mood and decreased concentration. The patient is not nervous/anxious.        Objective:   Physical Exam  Constitutional: She appears well-developed and well-nourished. No distress.  HENT:  Head: Normocephalic.  Right Ear: External ear normal.  Left Ear: External ear normal.  Nose: Nose normal.  Mouth/Throat: Oropharynx is clear and moist.  Eyes: Conjunctivae are normal. Pupils are equal, round, and reactive to light. Right eye exhibits no discharge. Left eye exhibits no discharge.  Neck: Normal range of motion. Neck supple. No JVD present. No tracheal deviation present. No thyromegaly present.  Cardiovascular: Normal rate, regular rhythm and normal heart sounds.   Pulmonary/Chest: No stridor. No respiratory distress. She has no wheezes.  Abdominal: Soft. Bowel sounds are normal. She exhibits no distension and no mass. There is no tenderness. There is no rebound and no guarding.  Musculoskeletal: She exhibits no edema and no tenderness.  Lymphadenopathy:    She has no cervical adenopathy.  Neurological: She displays normal reflexes. No cranial nerve deficit. She exhibits normal muscle tone. Coordination normal.  Skin: No rash noted. No erythema.  Psychiatric: She has a normal mood and affect. Her behavior is normal. Judgment and thought content normal.        Lab Results  Component Value Date   WBC 6.8 10/03/2012   HGB 13.9 10/03/2012   HCT 41.3 10/03/2012   PLT 287.0 10/03/2012   CHOL 289* 10/03/2012   TRIG 197.0* 10/03/2012   HDL 41.60 10/03/2012   LDLDIRECT 207.3 10/03/2012   ALT 46* 10/03/2012   AST 38*  10/03/2012   NA 135 10/03/2012   K 4.2 10/03/2012   CL 101 10/03/2012   CREATININE 0.6 10/03/2012   BUN 14 10/03/2012   CO2 27 10/03/2012   TSH 2.82 10/03/2012   HGBA1C 7.5* 10/10/2012     Assessment & Plan:   .

## 2012-12-03 NOTE — Patient Instructions (Signed)
Use over-the-counter  "cold" medicines  such as  "Afrin" nasal spray for nasal congestion as directed instead. Use" Delsym" or" Robitussin" cough syrup varietis for cough.  You can use plain "Tylenol" or "Advil" for fever, chills and achyness.  Please, make an appointment if you are not better or if you're worse.  

## 2012-12-04 LAB — MONONUCLEOSIS SCREEN: Mono Screen: NEGATIVE

## 2012-12-10 ENCOUNTER — Ambulatory Visit (AMBULATORY_SURGERY_CENTER): Payer: Self-pay | Admitting: *Deleted

## 2012-12-10 VITALS — Ht 69.0 in | Wt 187.2 lb

## 2012-12-10 DIAGNOSIS — Z1211 Encounter for screening for malignant neoplasm of colon: Secondary | ICD-10-CM

## 2012-12-10 MED ORDER — NA SULFATE-K SULFATE-MG SULF 17.5-3.13-1.6 GM/177ML PO SOLN: ORAL | Status: DC

## 2012-12-10 NOTE — Progress Notes (Signed)
No egg or soy allergy 

## 2012-12-11 ENCOUNTER — Encounter: Payer: Self-pay | Admitting: Internal Medicine

## 2012-12-12 ENCOUNTER — Encounter: Payer: Self-pay | Admitting: Gastroenterology

## 2012-12-24 ENCOUNTER — Encounter: Payer: 59 | Admitting: Gastroenterology

## 2012-12-26 ENCOUNTER — Other Ambulatory Visit: Payer: Self-pay

## 2013-02-03 ENCOUNTER — Ambulatory Visit (INDEPENDENT_AMBULATORY_CARE_PROVIDER_SITE_OTHER): Payer: PRIVATE HEALTH INSURANCE | Admitting: Internal Medicine

## 2013-02-03 ENCOUNTER — Encounter (INDEPENDENT_AMBULATORY_CARE_PROVIDER_SITE_OTHER): Payer: Self-pay | Admitting: Internal Medicine

## 2013-02-03 VITALS — BP 131/79 | HR 60 | Temp 97.9°F | Ht 67.5 in | Wt 113.8 lb

## 2013-02-03 DIAGNOSIS — M25552 Pain in left hip: Secondary | ICD-10-CM

## 2013-02-03 DIAGNOSIS — R5383 Other fatigue: Secondary | ICD-10-CM

## 2013-02-03 DIAGNOSIS — M25559 Pain in unspecified hip: Secondary | ICD-10-CM

## 2013-02-03 DIAGNOSIS — M722 Plantar fascial fibromatosis: Secondary | ICD-10-CM

## 2013-02-03 LAB — COMPREHENSIVE METABOLIC PANEL
ALT (GPT): 23 U/L (ref 6–40)
AST (GOT): 24 U/L (ref 15–40)
Albumin: 4.3 g/dL (ref 3.5–5.2)
Alkaline Phosphatase (Total): 42 U/L (ref 31–132)
Anion Gap: 7 (ref 3–11)
Bilirubin (Total): 0.9 mg/dL (ref 0.2–1.3)
Calcium: 10 mg/dL (ref 8.9–10.2)
Carbon Dioxide, Total: 30 mEq/L (ref 22–32)
Chloride: 103 mEq/L (ref 98–108)
Creatinine: 0.83 mg/dL (ref 0.38–1.02)
GFR, Calc, African American: >60 mL/min (ref >59)
GFR, Calc, European American: >60 mL/min (ref >59)
Glucose: 94 mg/dL (ref 62–125)
Potassium: 4.8 mEq/L (ref 3.7–5.2)
Protein (Total): 6.5 g/dL (ref 6.0–8.2)
Sodium: 140 mEq/L (ref 136–145)
Urea Nitrogen: 15 mg/dL (ref 8–21)

## 2013-02-03 LAB — CBC (HEMOGRAM)
Hematocrit: 38 % (ref 36–45)
Hemoglobin: 13.1 g/dL (ref 11.5–15.5)
MCH: 33.4 pg (ref 27.3–33.6)
MCHC: 34.4 g/dL (ref 32.2–36.5)
MCV: 97 fL (ref 81–98)
Platelet Count: 206 10*3/uL (ref 150–400)
RBC: 3.92 mil/uL (ref 3.80–5.00)
RDW-CV: 12.6 % (ref 11.6–14.4)
WBC: 4.93 10*3/uL (ref 4.3–10.0)

## 2013-02-03 LAB — LIPID PANEL
Cholesterol (LDL): 141 mg/dL — ABNORMAL HIGH (ref <130)
Cholesterol/HDL Ratio: 2.7
HDL Cholesterol: 91 mg/dL (ref >40)
Non-HDL Cholesterol: 151 mg/dL (ref 0–159)
Total Cholesterol: 242 mg/dL — ABNORMAL HIGH (ref <200)
Triglyceride: 51 mg/dL (ref <150)

## 2013-02-03 LAB — THYROID STIMULATING HORMONE: Thyroid Stimulating Hormone: 2.037 u[IU]/mL (ref 0.400–5.000)

## 2013-02-03 NOTE — Progress Notes (Signed)
The patient presents today for   Chief Complaint   Patient presents with   . Wellness     wellness exam   . Hip Pain     c/o LT sided hip pain   . Fatigue       Paula Bowman is a(n) 62 year old who presents for evaluation regarding left hip pain and fatigue. We deferred a physical today to discuss the issues below:    1. Left hip pain- has had this for 2-3 months, seeing chiropractor, has improved considerably. She has brought in x-rays. Started near the left hip bone  - walks 2-3 miles; 6- days weekly, she is power walker, she noted pain with walking. She would have radiating pain down the LLE.   - Chiropractor, tried traction which seems to help immensely  - pain is now 4/10 rather than 8/10  - she did have a recent fall- 3-4 months ago while playing pickleball. The pain did not start instantly.   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 Fatigue- notes is more often, feels that she looks tired and doesn't feel well  - Hx of breast cancer- now having mammograms once yearly  - Pap smear- fibroid tumor  - Colonoscopy- 2006, normal  - Taking Vitamin B and Vitamin D and other vitamin supplements  - Does not sleep well. Wakes up frequently throughout the night. Tries to sleep for 9 hours. Had tried melatonin in the past, but made her feel different  - healthy eater, cooks at home, lots of veggies    3. Right heel pain- for years, notes pain when she is off her heels as well. 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   Remote smoker    ROS:  No night sweats  No blood in stool, no changes  Denies cough  Appetite is good  Weight has been stable  No dysphagia  No abdominal pain  No dysuria    Past Medical History   Diagnosis Date   . Malignant neoplasm of  breast (female), unspecified site 2008     lumpectomy: Stage 0; Radiation; was ERP; She did not complete Tamoxifen due to side effects x1 yr.    . Esophageal reflux      Current Outpatient Prescriptions   Medication Sig   . Esomeprazole Magnesium (NEXIUM) 40 MG Oral CAPSULE DELAYED RELEASE TAKE 1 CAPSULE DAILY   . Fexofenadine HCl (ALLEGRA) 180 MG Oral Tab Take 1 tablet by mouth daily for allergies   . Fluticasone Propionate 50 MCG/ACT Nasal Suspension 2 puffs each nostril once daily for prevention/control of nasal/sinus congestion   . Hyoscyamine Sulfate (NULEV) 0.125 MG OR TBDP None Entered   . Olopatadine HCl (PATANOL) 0.1 % ophthalmic Solution 1 gtts in each eye twice daily as needed     No current facility-administered medications for this visit.       Physical Exam:  Filed Vitals:    02/03/13 0753   BP: 131/79   Pulse: 60   Temp: 97.9 F (36.6 C)   TempSrc: Temporal   Height: 5' 7.5" (1.715 m)   Weight: 113 lb 12.8 oz (51.619 kg)  SpO2: 98%     Body mass index is 17.55 kg/(m^2).    Gen: pleasant female, in NAD  Eyes: EOMI, PERRL  HENT: Pink oropharynx  Neck: no LAD  CV: RRR, s1, s2 normal, no appreciable murmurs  Resp: CTA bilaterally, no wheezing, rhonchi, or crackles  Abd: Soft, NT, ND, +BS, no hepatomegaly, no palpable masses  MSK: Left hip negative FABERE, ROM intact, mild TTP of left hip    Pertinent Imaging:   05/31/2011          Component Results      Component    General Diagnostic Text    Examination:  XR CHEST 2 VIEWS      Indication:  xsmoker, worried about lung cancer.        Comparison:  None.    Findings:  The heart and mediastinum are normal.     The lungs and pleural spaces are clear. There is no indication of mass or   neoplasm, pneumonia, congestive heart failure, or other acute process.     The soft tissues and bones of the thorax are unremarkable.      Impression    Impression:  1. No evidence of acute cardiopulmonary disease.       Assessment/Plan:   (780.79) Fatigue  (primary encounter  diagnosis)- multifactorial, but will r/o metabolic process. Preventative screening UTD incl CXR from 2013 which was normal.   Plan: CBC (HEMOGRAM), COMPREHENSIVE METABOLIC PANEL,         LIPID PANEL, THYROID STIMULATING HORMONE    (719.45) Left hip pain- improving, but given fall, consider occult fracture. She will let us know if not improving in the next few weeks and will consider further imaging such as CT or MRI    (728.71) Plantar fasciitis of right foot  - conservative measures discussed  - consider podiatry referral in the future if not improving    RTC PRN

## 2013-02-03 NOTE — Patient Instructions (Addendum)
It was a pleasure to see you in clinic today. Your Medical Assistant was: Murray Hodgkins can schedule an appointment to see [place] by calling [phone] or via eCare.     If labs were ordered today the results are expected to be available via eCare 5 days later. Otherwise, result letters are mailed 7-10 days after your tests are completed. If your physician needs to change your care based on your results, you will receive a phone call to notify you. If you haven't heard from him/her and it has been more than 10 days please give [place] a call.     Thank you for choosing [facility].     Left Hip Pain  - if not improving, let us know, we may need to obtain further imaging to rule out occult fracture    Plantar Fasciitis  - Avoid using flat shoes or walking barefoot  - Try wearing athletic or orthopedic shoes   - Use over the counter heel or shoe inserts  - Rest and ice the area  - Please perform the following exercises    Fatigue  - We will check some lab work today  - Vitamin B 12 supplements    Seasonal affective disorder lights- "happy lights".       Treating Plantar Fasciitis    First, your doctor relieves pain. Then, the cause of your problem may be found and corrected. If your pain is due to poor foot mechanics, custom-made shoe inserts (orthoses) may help.    Reduce Symptoms   To relieve mild symptoms, try aspirin, ibuprofen, or other medications as directed. Rubbing ice on the affected area may also help.   To reduce severe pain and swelling, your doctor may prescribe pills or injections. Physical therapy, such as ultra sound or stretching exercises, may also be recommended.   To reduce symptoms caused by poor foot mechanics, your foot may be taped. This supports the arch and temporarily controls movement. Night splints may also help by stretching the fascia.    Control Movement  If taping helps, your doctor may prescribe orthoses. Built from plaster casts of your feet, these  inserts control the way your foot moves. As a result, your symptoms should go away.  If Surgery Is Needed  Your doctor may consider surgery if other types of treatment don't control your pain. During surgery, the plantar fascia is partially cut to release tension. As you heal, fibrous tissue fills the space between the heel bone and the plantar fascia.   Reduce Overuse  Every time your foot strikes the ground, the plantar fascia is stretched. You can reduce the strain on the plantar fascia and the possibility of overuse by following these suggestions:   Lose any excess weight.   Avoid running on hard or uneven ground.   Use orthoses at all times in your shoes and house slippers.   [address], [address] [place], [place] [address]. All rights reserved. This information is not intended as a substitute for professional medical care. Always follow your healthcare professional's instructions.        Plantar Fasciitis  The "plantar fascia" is a thick fibrous layer of tissue that covers the bones on the bottom of your foot. It supports the foot bones in an arched position. "Plantar fasciitis" is a painful inflammation of the plantar fascia. This can develop gradually or suddenly. It usually  affects one foot at a time. Heel pain can be sharp and feel like a knife sticking in the bottom of your foot. Pain may occur after exercising, long distance jogging, stair climbing, long periods of standing, or after getting up from a seated position.  Risk factors include arthritis, diabetes, obesity or recent weight gain, flat-foot, high arch, wearing high heels or loose shoes or shoes with a poor arch support.  Foot pain from this condition is usually worse in the morning and improves with walking. By the end of the day there may be a dull aching. Treatment requires short-term rest and controlling inflammation. It may take up to nine months before all symptoms go away with the measures described below. Rarely,  a steroid injection into the foot or surgery may be needed.  Home Care  1. If you are overweight, lose weight to promote healing.  2. Choose supportive shoes with good arch support and shock absorbency. Replace athletic shoes when they become worn out. Don't walk or run barefoot.  3. Shoe inserts are an important part of treatment. These will provide optimal arch support. While you can buy off-the-shelf shoe inserts inexpensively, the best ones are those made for you by a podiatrist (foot specialist).  4. Night splints (provided by a podiatrist) keep the heel stretched out while you sleep and prevent morning pain.  5. Avoid activities that stress the feet: jogging, prolonged standing or walking, contact sports, etc.  6. First thing in the morning and before sports, stretch the bottom of your feet. Gently flex your ankle so the foot moves toward your knee.  7. Icing may help control heel pain. Apply an ice pack (ice cubes in a plastic bag, wrapped in a towel) to the heel for 10-20 minutes as a preventive or after an acute flare of symptoms. You may repeat this every 1-2 hours as needed.  8. You may use acetaminophen (Tylenol) or ibuprofen (Motrin, Advil) to control pain, unless another medicine was prescribed. [NOTE: If you have chronic liver or kidney disease or ever had a stomach ulcer or GI bleeding, talk with your doctor before using these medicines.]  Follow Up  with your doctor or a podiatrist (foot specialist) as advised by our staff. Call for an appointment if pain worsens or there is no relief after a few weeks of home treatment. Shoe inserts, a night splint or a special boot may be required.  [NOTE: If x-rays were taken, they will be reviewed by a radiologist. You will be notified of any new findings that may affect your care.]  Return Promptly  or contact your physician if any of the following occurs:   Foot swelling or redness with increasing pain       [address] [place], [place] [address]. All rights reserved. This information is not intended as a substitute for professional medical care. Always follow your healthcare professional's instructions.        What Is Plantar Fasciitis?   The plantar fascia is a ligament-like band running from your heel to the ball of your foot. This band pulls on the heel bone, raising the arch of your foot as it pushes off the ground. But if your foot moves incorrectly, the plantar fascia may become strained. The fascia may swell and its tiny fibers may begin to fray, causing plantar fasciitis.  Causes  Plantar fasciitis is often caused by poor foot mechanics. If your foot flattens too much, the fascia may  overstretch and swell. If your foot flattens too little, the fascia may ache from being pulled too tight.     Foot flattens too much        Foot flattens too little   Symptoms  With plantar fasciitis, the bottom of your foot may hurt when you stand, especially first thing in the morning. Pain usually occurs on the inside of the foot, near the spot where your heel and arch meet. Pain may lessen after a few steps, but it comes back after rest or with prolonged movement.  Related Problems  A heel spur is extra bone that may grow near the spot where the plantar fascia attaches to the heel. The heel spur may form in response to the plantar fascia's tug on the heel bone.  Bursitis is the swelling of a bursa, a fluid-filled sac that reduces friction between a ligament and a bone. Bursitis may develop if a swollen plantar fascia presses against a plantar bursa.   [address], [place], [place] [address]. All rights reserved. This information is not intended as a substitute for professional medical care. Always follow your healthcare professional's instructions.

## 2013-02-03 NOTE — Progress Notes (Signed)
Vitals, medication, and allergies verified by Einar Nolasco, MA-C

## 2013-02-03 NOTE — Progress Notes (Signed)
Pt roomed, medications, and allergies verified and updated by Angelita Ingles, MA-C  Angelita Ingles, 02/03/2013 7:42 AM

## 2013-02-05 ENCOUNTER — Encounter (INDEPENDENT_AMBULATORY_CARE_PROVIDER_SITE_OTHER): Payer: Self-pay | Admitting: Internal Medicine

## 2013-02-11 ENCOUNTER — Other Ambulatory Visit: Payer: Self-pay

## 2013-02-24 ENCOUNTER — Telehealth: Payer: Self-pay | Admitting: *Deleted

## 2013-02-24 NOTE — Telephone Encounter (Signed)
Pt called requesting a Z Pak, states she has a sinus infection.  Pt does not want to come in for OV.  Please advise

## 2013-02-24 NOTE — Telephone Encounter (Signed)
Ok Zpac Thx 

## 2013-02-25 MED ORDER — AZITHROMYCIN 250 MG PO TABS: ORAL_TABLET | ORAL | Status: DC

## 2013-02-25 NOTE — Telephone Encounter (Signed)
Spoke with pt advised Rx sent 

## 2013-04-14 ENCOUNTER — Ambulatory Visit: Payer: PRIVATE HEALTH INSURANCE | Attending: Nurse Practitioner

## 2013-04-14 ENCOUNTER — Encounter (INDEPENDENT_AMBULATORY_CARE_PROVIDER_SITE_OTHER): Payer: PRIVATE HEALTH INSURANCE | Admitting: Internal Medicine

## 2013-04-14 ENCOUNTER — Ambulatory Visit (HOSPITAL_BASED_OUTPATIENT_CLINIC_OR_DEPARTMENT_OTHER): Payer: PRIVATE HEALTH INSURANCE | Admitting: Nurse Practitioner

## 2013-04-14 DIAGNOSIS — Z853 Personal history of malignant neoplasm of breast: Secondary | ICD-10-CM | POA: Insufficient documentation

## 2013-04-14 DIAGNOSIS — Z1231 Encounter for screening mammogram for malignant neoplasm of breast: Secondary | ICD-10-CM

## 2013-09-01 ENCOUNTER — Ambulatory Visit
Payer: PRIVATE HEALTH INSURANCE | Attending: Rehabilitative and Restorative Service Providers" | Admitting: Rehabilitative and Restorative Service Providers"

## 2013-09-01 ENCOUNTER — Encounter (HOSPITAL_BASED_OUTPATIENT_CLINIC_OR_DEPARTMENT_OTHER): Payer: Self-pay | Admitting: Rehabilitative and Restorative Service Providers"

## 2013-09-01 VITALS — BP 122/85 | HR 64 | Ht 67.5 in | Wt 113.0 lb

## 2013-09-01 DIAGNOSIS — IMO0002 Reserved for concepts with insufficient information to code with codable children: Secondary | ICD-10-CM

## 2013-09-01 DIAGNOSIS — M25561 Pain in right knee: Secondary | ICD-10-CM

## 2013-09-01 DIAGNOSIS — M25569 Pain in unspecified knee: Secondary | ICD-10-CM | POA: Insufficient documentation

## 2013-09-01 DIAGNOSIS — M1711 Unilateral primary osteoarthritis, right knee: Secondary | ICD-10-CM

## 2013-09-01 NOTE — Progress Notes (Signed)
CC: Right knee pain  HPI: Paula Bowman is a 63 year old female presenting with right knee pain of 1 year duration. She has had 1 year of progressively worsening right lateral and deep in the knee joint. The pain is worst with driving. She finds that any length of time seated will cause aching pain in the knee joint. She is very active playing pickleball 4-5 days per week and power walking daily. She has no pain or discomfort with either activity. Stairs are able to be navigated with aching pain only. With full extension of the knee she has there is a lateral side pian deep within the joint. She will have intermittent swelling and pain that wakes her from sleep at night. Since presentation symptoms have worsening. Pain is described as occasional sharp pain and constant aching, with no radiation. No locking, no catching, no giving out, intermittent swelling. There is no sense of instability. Prior orthopedic history of right knee injury with loose body removed in 1969. Current intensity is 4-5/10, improved by rest and made worse by driving. Treatments employed have included none.      ROS: As per HPI, no other joint pain, stiffness, fevers, chills, malaise, rash, N/V/D/C, CP, SOB or other complaint on 14 point CPT based review of systems. Positive hay fever.    Patient Active Problem List   Diagnosis   . PERS HX OF BREAST MALIGNANCY   . Anxiety state, unspecified   . Other chronic allergic conjunctivitis   . GERD (gastroesophageal reflux disease)   . Eczema   . Allergic rhinitis   . Onychomycosis   . Back pain       Past Medical History   Diagnosis Date   . Malignant neoplasm of breast (female), unspecified site 2008     lumpectomy: Stage 0; Radiation; was ERP; She did not complete Tamoxifen due to side effects x1 yr.    . Esophageal reflux        Current Outpatient Prescriptions   Medication Sig Dispense Refill   . Esomeprazole Magnesium (NEXIUM) 40 MG Oral CAPSULE DELAYED RELEASE TAKE 1 CAPSULE DAILY 90 Cap 3   .  Fexofenadine HCl (ALLEGRA) 180 MG Oral Tab Take 1 tablet by mouth daily for allergies 90 Tab 3   . Fluticasone Propionate 50 MCG/ACT Nasal Suspension 2 puffs each nostril once daily for prevention/control of nasal/sinus congestion 1 Inhaler 3   . Olopatadine HCl (PATANOL) 0.1 % ophthalmic Solution 1 gtts in each eye twice daily as needed 3 Bottle 3     No current facility-administered medications for this visit.       Review of patient's allergies indicates:  Allergies   Allergen Reactions   . Amoxicillin Rash   . Levofloxacin Itching     Redness up arm from IV dose   . Penicillin G Rash   . Terbinafine Hcl      Loss of sense of taste       Family History   Problem Relation Age of Onset   . Cancer Sister      Breast cancer at 28. Was not tested for BRCA   . 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 Non antalgic. No genu recurvatum. Normal stance and stride phases.   Knee exam (focus on Right knee):  -- Inspection: No effusion notable. No bruising, swelling, atrophy or asymmetry noted when compared to the unaffected knee. Mild genu valgum in standing.  -- ROM: ROM ranges from 0 to 125 degrees with knee flexion and extension  -- Strength: 5/5 strength with knee flexion and extension  -- Special tests: Anterior drawer negative. Posterior drawer negative. Lachman's maneuver negative. McMurray test negative with palpable crepitus. Varus and valgus stress testing at 0 and 30 degrees is negative . Patellar grind negative. Patellar inhibition negative. Patellar apprehension negative. Single leg squats able to complete without difficulty.  -- Palpation: Effusion is non-palpable. Patellar facets not tender. Palpation along the patellar tendon is non-tender. Palpation  at the pes anserinus reveals no tenderness. No plica tendernes.There is +medial joint line tenderness, no lateral joint line tenderness. MCL non-tender to palpation. LCL non-tender to palpation.    RADIOGRAPHIC IMAGING STUDIES  4 view right knee x-ray were ordered in clinic today. I personally reviewed these x-rays and my interpretation is as follows: They demonstrate mild tricompartmental osteoarthritis worst in the patellar femoral compartment.      A/P Right knee osteoarthritis  1. 4 view right knee x-ray  2. Discussed with the patient bracing and taping options for the patellar femoral pain.   3. Referral to physical therapy  4. Consider injection options   5. Follow up as needed    Ricci Barker, ATC, PA-C

## 2013-09-01 NOTE — Patient Instructions (Signed)
Thank you for your visit today. As we discussed:  1. Referral to physical therapy  2. Ibuprofen as needed  3. Try the brace for driving and activities  4. Follow up as needed    Ricci Barker, ATC, PA-C

## 2013-09-18 ENCOUNTER — Encounter: Payer: Self-pay | Admitting: Gastroenterology

## 2013-09-18 ENCOUNTER — Encounter (INDEPENDENT_AMBULATORY_CARE_PROVIDER_SITE_OTHER): Payer: PRIVATE HEALTH INSURANCE | Admitting: Internal Medicine

## 2013-09-27 ENCOUNTER — Encounter (INDEPENDENT_AMBULATORY_CARE_PROVIDER_SITE_OTHER): Payer: Self-pay | Admitting: Family Medicine

## 2013-09-27 ENCOUNTER — Ambulatory Visit (INDEPENDENT_AMBULATORY_CARE_PROVIDER_SITE_OTHER): Payer: Worker's Compensation | Admitting: Family Medicine

## 2013-09-27 VITALS — BP 139/72 | HR 71 | Temp 98.3°F | Resp 16 | Wt 113.0 lb

## 2013-09-27 DIAGNOSIS — T675XXA Heat exhaustion, unspecified, initial encounter: Secondary | ICD-10-CM

## 2013-09-27 NOTE — Progress Notes (Signed)
Medications, allergies, vitals verified by Dawn/CMA

## 2013-09-27 NOTE — Patient Instructions (Signed)
Referral to [facility].   Request a referral to Psychiatry from [place].

## 2013-09-27 NOTE — Progress Notes (Signed)
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 It was thought that the plane was much hotter than this. The bus had left so the crew was stranded on the plane. She went to the upper deck and had to go back down to the lower deck as it was unbearably hot there. She was on the plane for 45 minutes waiting for the pilots to arrive. She put her head in ice and others were trying to cool her. An emergency bus that was semi air conditioned was called. She states she could not talk when they arrived. VS: 177/110; (usually BP is 90/65); She was given a sublingual BP meds. She did get back to the terminal and took a bus to the hotel and then the the medical clinic.       (She notes that when she gets stressed out it goes right to the bowels. She did not have diarrhea prior to this).     Since the incident she feels a little dizzy, fuzzy, has diarrhea, taking Lomotil.  She has lost 3 lbs.   She notes that she is very anxious, nightmares about being on planes.     She has a history of panic attacks. She was on imipramine in the past.     ROS:   Constitutional: Energy today is okay.   Skin: no   Eyes/Vision: little fuzzy  ENTM: (-)   Endocrine: (-)   Respiratory: anxiety related breathing symptoms.    CV: heart races when she talks about this episode.   GI: diarrhea resolved. No stools for 24 hr.   GU: (-)   MSK: nothing out of the ordinary. She feels the muscles are week and she is very tired.   NEURO: (-)    PSYCH: as above.   Immune/Seasonal Allergies: (-)   HEME/Lymphatic: (-) bruises easily at baseline.        Review of patient's allergies indicates:  Allergies   Allergen Reactions   . Amoxicillin Rash   . Levofloxacin Itching     Redness up arm from IV dose   .  Penicillin G Rash   . Terbinafine Hcl      Loss of sense of taste       Past Medical History   Diagnosis Date   . Malignant neoplasm of breast (female), unspecified site 2008     lumpectomy: Stage 0; Radiation; was ERP; She did not complete Tamoxifen due to side effects x1 yr.    . Esophageal reflux      Past Surgical History   Procedure Laterality Date   . Vaginal hysterectomy uterus 250 gm/<  1994     Ovaries in place   . Knee, r knee at age 21, removed floating bone     . Total abdominal hysterect w/wo rmvl tube ovary       for fibroids   . Colonoscopy stoma dx including collj spec spx  2006     [place] , FU 10 years   . Hernia repair     . Breast lumpectomy     . Unlisted procedure femur/knee Right 1969     Family History   Problem Relation Age of Onset   .  Cancer Sister      Breast cancer at 31. Was not tested for BRCA   . Colon Cancer No Hx Of      History   Substance Use Topics   . Smoking status: Former Smoker -- 2.00 packs/day for 15 years     Types: Cigarettes     Quit date: 01/07/1988   . Smokeless tobacco: Never Used   . Alcohol Use: 0.5 oz/week     1 Not specified per week      Comment: 2 glasses of wine daily     Meds:  listed at end.      PE  VS: as above.  GEN:  alert, cooperative.  SKIN:  no rash or lesion  EYES:  EOMI; PERR; conjunctivae, sclera nl.       ENTM:  TMs nonerythematous; nasopharynx nl; oropharynx nl;  NECK: supple without noted limitation in active ROM. No adenopathy.   PULM: clear;   Cor: RRR; no murmur.  ABD: Nl appearance. Nl bowel sounds.  No organomegaly.  EXT: Distal perfusion good.  AFFECT: was initially quite anxious, but then seemed to calm down over the visit.   NEURO: Grossly nl; Oriented.            Encounter Diagnosis   Name Primary?   Wilfred Lacy exhaustion, initial encounter Yes     Referral to [facility].   Request a referral to Psychiatry from [place]. provider

## 2013-10-01 DIAGNOSIS — T675XXA Heat exhaustion, unspecified, initial encounter: Secondary | ICD-10-CM | POA: Insufficient documentation

## 2013-11-10 ENCOUNTER — Ambulatory Visit (INDEPENDENT_AMBULATORY_CARE_PROVIDER_SITE_OTHER): Payer: PRIVATE HEALTH INSURANCE | Admitting: Internal Medicine

## 2013-11-10 ENCOUNTER — Telehealth (INDEPENDENT_AMBULATORY_CARE_PROVIDER_SITE_OTHER): Payer: Self-pay | Admitting: Internal Medicine

## 2013-11-10 ENCOUNTER — Encounter (INDEPENDENT_AMBULATORY_CARE_PROVIDER_SITE_OTHER): Payer: Self-pay | Admitting: Internal Medicine

## 2013-11-10 VITALS — BP 108/53 | HR 66 | Temp 98.2°F | Resp 16 | Wt 114.0 lb

## 2013-11-10 DIAGNOSIS — Z23 Encounter for immunization: Secondary | ICD-10-CM

## 2013-11-10 DIAGNOSIS — Z760 Encounter for issue of repeat prescription: Secondary | ICD-10-CM

## 2013-11-10 DIAGNOSIS — H1045 Other chronic allergic conjunctivitis: Secondary | ICD-10-CM

## 2013-11-10 DIAGNOSIS — R197 Diarrhea, unspecified: Secondary | ICD-10-CM

## 2013-11-10 DIAGNOSIS — D0510 Intraductal carcinoma in situ of unspecified breast: Secondary | ICD-10-CM | POA: Insufficient documentation

## 2013-11-10 LAB — COMPREHENSIVE METABOLIC PANEL
ALT (GPT): 19 U/L (ref 7–33)
AST (GOT): 23 U/L (ref 9–38)
Albumin: 4.2 g/dL (ref 3.5–5.2)
Alkaline Phosphatase (Total): 44 U/L (ref 31–132)
Anion Gap: 6 (ref 4–12)
Bilirubin (Total): 0.4 mg/dL (ref 0.2–1.3)
Calcium: 10 mg/dL (ref 8.9–10.2)
Carbon Dioxide, Total: 30 mEq/L (ref 22–32)
Chloride: 103 mEq/L (ref 98–108)
Creatinine: 0.85 mg/dL (ref 0.38–1.02)
GFR, Calc, African American: >60 mL/min (ref >59)
GFR, Calc, European American: >60 mL/min (ref >59)
Glucose: 88 mg/dL (ref 62–125)
Potassium: 4.4 mEq/L (ref 3.6–5.2)
Protein (Total): 6.7 g/dL (ref 6.0–8.2)
Sodium: 139 mEq/L (ref 135–145)
Urea Nitrogen: 31 mg/dL — ABNORMAL HIGH (ref 8–21)

## 2013-11-10 LAB — CBC (HEMOGRAM)
Hematocrit: 38 % (ref 36–45)
Hemoglobin: 12.9 g/dL (ref 11.5–15.5)
MCH: 33.1 pg (ref 27.3–33.6)
MCHC: 34 g/dL (ref 32.2–36.5)
MCV: 97 fL (ref 81–98)
Platelet Count: 213 10*3/uL (ref 150–400)
RBC: 3.9 mil/uL (ref 3.80–5.00)
RDW-CV: 12.3 % (ref 11.6–14.4)
WBC: 5.84 10*3/uL (ref 4.3–10.0)

## 2013-11-10 LAB — THYROID STIMULATING HORMONE: Thyroid Stimulating Hormone: 2.446 u[IU]/mL (ref 0.400–5.000)

## 2013-11-10 MED ORDER — OLOPATADINE HCL 0.1 % OP SOLN
OPHTHALMIC | Status: DC
Start: 2013-11-10 — End: 2013-11-17

## 2013-11-10 MED ORDER — FLUTICASONE PROPIONATE 50 MCG/ACT NA SUSP
2.0000 | Freq: Every day | NASAL | Status: DC
Start: 2013-11-10 — End: 2021-05-19

## 2013-11-10 NOTE — Patient Instructions (Addendum)
It was a pleasure to see you in clinic today. Your Medical Assistant was: Claudette Stapler will be available on eCare     Schedule colonoscopy                You can schedule an appointment to see [place] by calling [phone] or via eCare.          Thank you for choosing [facility].

## 2013-11-10 NOTE — Telephone Encounter (Signed)
Release labs ASAP so she can see

## 2013-11-10 NOTE — Progress Notes (Signed)
Pt roomed, medications, and allergies verified and updated by August Luz, MA-C  August Luz, 11/10/2013 8:56 AM    Vaccine Screening Questions      1.  Have you had a serious reaction or an allergic reaction to a vaccine?  NO    2.  Currently have a moderate or severe illness, including fever? (Don't Ask if vaccine   ordered by provider same day)  NO    3.  Ever had a seizure or a brain or other nervous system problem syndrome associated with a vaccine? (DTaP/TDaP/DTP pertinent) NO    4.  Is patient receiving  any live vaccinations today? (Varicella-Chickenpox, MMR-Measles/Mumps/Rubella, Zoster-Shingles)  NO    If YES to any of the questions above - Do NOT give vaccine.  Consult with RN or provider in clinic.  (#4 can be YES if all Live vaccine questions are answered NO)    If NO to all questions above - Patient may receive vaccine.    5.  Do you need to receive the Flu vaccine today? YES - Additional Flu Questions  Flu Vaccine Screening Questions:    Ever had a serious allergic reaction to eggs?  NO    Ever had Guillain-Barre syndrome associated with a vaccine? NO    Less than 6 months old? NO    If YES to any of the Flu questions above - NO Flu Vaccine to be given.  Patient may consult provider as needed.    If NO to all questions above - Patient may receive Flu Shot (IM)    If between 6 months and 79 years of age, was flu vaccine received last year?  N/A  If NO to above question:  . For the 2014-15 season, administer 2 doses (separated by at least 4 weeks) to children who are receiving influenza vaccine for the first time.   . For the 2014-15 season, follow dosing guidelines in the 2013 ACIP influenza vaccine recommendations     Flu Mist (Nasal) may be considered if patient is >2 yrs old and < 5 yrs old.    Do you wish to proceed with screening questions for Flu Mist?  NO     Vaccine information sheet(s) discussed, patient/parent/guardian verbalized understanding? YES     VIS given 11/10/2013 by August Luz MA.

## 2013-11-10 NOTE — Progress Notes (Signed)
Paula Bowman is a 63 year old woman here with complaint of diarrhea.    Having diarrhea for approximately 6 weeks.  Started after in [place] where she had heat stroke.     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   Initially having one loose bowel movement daily for couple of weeks.  Since then it has been occurring about every 3-4 days that she will have 2-3 loose stools.  After she has it , she is completely cleaned out and then doesn't have a BM for 3-4 days.   Took imodium 3 times with this when it got so severe.  Last had it 3 days ago.    In the past she was more constipated than loose.   Lots of stress with the L&I case.      She has some imiprimine at home.     Over the last 2 weeks how often have you been bothered by any of the following problems?  1. Little interest or pleasure in doing things: More than half the days  2. Feeling down, depressed or hopeless: More than half the days  3. Trouble falling or staying asleep, or sleeping too much: More than half the days  4. Feeling tired or having little energy : More than half the days  5. Poor appetite or overeating: More than half the days  6. Feeling bad about yourself - or that you are a failure or have let yourself or your family down: More than half the days  7. Trouble concentrating on things, such as reading the newspaper or watching television: More than half the days  8. Moving or speaking much more slowly than usual.  Or the opposite - fidgety or restless: Several days  9. Thoughts that you would be better off dead or of hurting yourself in some way: Not at all    Total  PHQ9 Total Score: 15    10. If you checked off any problems, how difficult have these problems made it for you to do your work, take care of things at home, or get along with other people?: Somewhat difficult      GAD-7 Anxiety 11/10/2013   Not being able to stop or control worrying More than half the days   Worrying too much about different things More than half the  days   Trouble relaxing More than half the days   Being so restless that it is hard to sit still More than half the days   Becoming easliy annoyed or irritable Nearly every day   Feeling afraid, as if something awful might happen More than half the days   If you checked any problems, how difficult have they made it for you to do your work, take care of things at home, or get along with other people? Somewhat difficult         During the past 12 months, did...  You use alcohol or drugs at least once a week?  Yes, alcohol.  No drugs.  Has 2 glasses wine in evening.   You spend a lot of time either getting alcohol or drugs, using alcohol or drugs, or feeling the effects of alcohol or drugs (high,sick)?   NO  You keep using alcohol or drugs even though it was causing social problems, leading to fights, or gettingyou into trouble with other people?  NO  Your use of alcohol or drugs cause you to give up, reduce or have problems at  important activities at work, school, home or social events?  NO  You have withdrawal problems from alcohol or drugs like shaking hands, throwing up, having trouble sitting still or sleeping, or use of alcohol or drugs to stop being sick or avoid withdrawal problems?  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 Esomeprazole Magnesium (NEXIUM) 40 MG Oral CAPSULE DELAYED RELEASE TAKE 1 CAPSULE DAILY 90 Cap 3   . Fexofenadine HCl (ALLEGRA) 180 MG Oral Tab Take 1 tablet by mouth daily for allergies 90 Tab 3   . Fluticasone Propionate 50 MCG/ACT Nasal Suspension 2 puffs each nostril once daily for prevention/control of nasal/sinus congestion 1 Inhaler 3   . Olopatadine HCl (PATANOL) 0.1 % ophthalmic Solution 1 gtts in each eye twice daily as needed 3 Bottle 3     No current facility-administered medications for this visit.         Exam  BP 108/53 mmHg  Pulse 66  Temp(Src) 98.2 F (36.8 C) (Temporal)  Resp 16  Wt 114 lb (51.71 kg)  SpO2 98%   Chest: clear  Cardiac: S1S2 normal  Abdomen: no organomegaly    Office Visit on 11/10/13   1. CBC (HEMOGRAM)   Result Value Ref Range    WBC 5.84 4.3 - 10.0 THOU/uL    RBC 3.90 3.80 - 5.00 mil/uL    Hemoglobin 12.9 11.5 - 15.5 g/dL    Hematocrit 38 36 - 45 %    MCV 97 81 - 98 fL    MCH 33.1 27.3 - 33.6 pg    MCHC 34.0 32.2 - 36.5 g/dL    Platelet Count 213 150 - 400 THOU/uL    RDW-CV 12.3 11.6 - 14.4 %   2. THYROID STIMULATING HORMONE   Result Value Ref Range    Thyroid Stimulating Hormone 2.446 0.400 - 5.000 uIU/mL   3. COMPREHENSIVE METABOLIC PANEL   Result Value Ref Range    Sodium 139 135 - 145 mEq/L    Potassium 4.4 3.6 - 5.2 mEq/L    Chloride 103 98 - 108 mEq/L    Carbon Dioxide, Total 30 22 - 32 mEq/L    Anion Gap 6 4 - 12    Glucose 88 62 - 125 mg/dL    Urea Nitrogen 31 (H) 8 - 21 mg/dL    Creatinine 0.85 0.38 - 1.02 mg/dL    Protein (Total) 6.7 6.0 - 8.2 g/dL    Albumin 4.2 3.5 - 5.2 g/dL    Bilirubin (Total) 0.4 0.2 - 1.3 mg/dL    Calcium 10.0 8.9 - 10.2 mg/dL    AST (GOT) 23 9 - 38 U/L    Alkaline Phosphatase (Total) 44 31 - 132 U/L    ALT (GPT) 19 7 - 33 U/L    GFR, Calc, European American >60 >59 mL/min    GFR, Calc, African American >60 >59 mL/min    GFR, Information       Calculated GFR in mL/min/1.73 m2 by MDRD equation.  Inaccurate with changing renal function.  See [email or web address]        Assessment  Encounter Diagnoses   Name Primary?   . Diarrhea Yes   . Other chronic allergic conjunctivitis    . Need for prophylactic vaccination and inoculation against  influenza    . Medication refill           Plan  Flu vaccine    Labs     Schedule colonoscopy

## 2013-11-11 ENCOUNTER — Other Ambulatory Visit (INDEPENDENT_AMBULATORY_CARE_PROVIDER_SITE_OTHER): Payer: PRIVATE HEALTH INSURANCE

## 2013-11-11 DIAGNOSIS — R197 Diarrhea, unspecified: Secondary | ICD-10-CM

## 2013-11-11 LAB — STOOL LEUKOCYTE EXAM

## 2013-11-11 NOTE — Progress Notes (Signed)
Sample drop off Stool 3 vials

## 2013-11-14 LAB — STOOL C/S AND ENTERIC BATTERY: SHIGA TOXINS: NOT DETECTED

## 2013-11-16 ENCOUNTER — Encounter (INDEPENDENT_AMBULATORY_CARE_PROVIDER_SITE_OTHER): Payer: Self-pay | Admitting: Internal Medicine

## 2013-11-17 ENCOUNTER — Telehealth (INDEPENDENT_AMBULATORY_CARE_PROVIDER_SITE_OTHER): Payer: Self-pay | Admitting: Internal Medicine

## 2013-11-17 DIAGNOSIS — H1045 Other chronic allergic conjunctivitis: Secondary | ICD-10-CM

## 2013-11-17 MED ORDER — AZELASTINE HCL 0.05 % OP SOLN
1.0000 [drp] | Freq: Two times a day (BID) | OPHTHALMIC | Status: DC
Start: 2013-11-17 — End: 2014-01-20

## 2013-11-17 NOTE — Telephone Encounter (Signed)
Alternative script faxed

## 2013-11-17 NOTE — Telephone Encounter (Signed)
CONFIRMED PHONE NUMBER: [phone]  CALLERS FIRST AND LAST NAME: [name]  FACILITY NAME: [facility] TITLE: Representative  CALLERS RELATIONSHIP:OTHER: n/a  RETURN CALL: General message OK     SUBJECT: Prescription Management   REASON FOR REQUEST: insurance issue    MEDICATION: Patanol  CONCERN/QUESTION: Reference [id number]; [name] states that if medication is changed to alternative, Azelastine or Epinastine, it'll be less expensive since the Patanol is not on the patient's insurance approved medication list. Please follow up  PRESCRIBING PROVIDER: Dr Velva Harman  DOSE TAKING NOW: See below  PHARMACY NAME, LOCATION, & PHONE #: see above

## 2013-11-17 NOTE — Telephone Encounter (Signed)
Please review lab results.

## 2013-11-17 NOTE — Telephone Encounter (Signed)
Please see TE and advise

## 2013-11-18 LAB — EXTENDED STOOL PARASITE MICROSCOPIC EXAM

## 2013-11-27 ENCOUNTER — Ambulatory Visit (INDEPENDENT_AMBULATORY_CARE_PROVIDER_SITE_OTHER): Payer: PRIVATE HEALTH INSURANCE | Admitting: Family

## 2013-11-27 ENCOUNTER — Encounter (INDEPENDENT_AMBULATORY_CARE_PROVIDER_SITE_OTHER): Payer: Self-pay | Admitting: Family

## 2013-11-27 VITALS — BP 107/69 | HR 69 | Temp 98.0°F | Ht 67.5 in | Wt 114.0 lb

## 2013-11-27 DIAGNOSIS — M542 Cervicalgia: Secondary | ICD-10-CM

## 2013-11-27 DIAGNOSIS — Z23 Encounter for immunization: Secondary | ICD-10-CM | POA: Insufficient documentation

## 2013-11-27 DIAGNOSIS — M5412 Radiculopathy, cervical region: Secondary | ICD-10-CM | POA: Insufficient documentation

## 2013-11-27 MED ORDER — METHOCARBAMOL 500 MG OR TABS
500.0000 mg | ORAL_TABLET | Freq: Four times a day (QID) | ORAL | Status: DC | PRN
Start: 2013-11-27 — End: 2014-01-20

## 2013-11-27 NOTE — Patient Instructions (Addendum)
It was a pleasure to see you in clinic today. Your Medical Assistant was: Ray[place] Citizen can schedule an appointment to see [place] by calling [phone] or via eCare.     Dear Paula Bowman,    It was a pleasure taking care of you today.  Below you will find information on your visit and any home instructions.  Please take the time to review your discharge paperwork.  If you have not already signed up for E-Care please do as this is the easiest and fastest way to communicate with me.  Once your lab results are ready I can also send them to you via E-Care for you to review.     I have ordered some x-rays of your back and neck.  I will call you with the results tomorrow once our radiologists have read it.  I know you are reluctant to be seen by Pt but I recommend that you consider this option if there is no signs of fracture on your xray.  You may also benefit from massage therapy.    Do not hesitate to call with any questions or concerns.     [employer].     Thank you for choosing [facility].      Neck Pain [No Trauma]  There are several possible causes of neck pain without injury:   You can get a minor ligament sprain or muscle strain from a sudden minor neck movement. Sleeping with your neck in an awkward position can also cause this.   Some persons respond to emotional stress by tensing the muscles of their neck, shoulders and upper back. Chronic spasm in these muscles can cause neck pain and sometimes headaches.   Gradual"wear and tear"of the joints in the spine can cause"degenerative arthritis."This can be a source of occasional or chronic neck pain.   With aging or repeated small injuries to the neck, the spinal disks (the cushions between each spinal bone) may bulge and put pressure on a nearby spinal nerve. This causes tingling, pain or numbness spreading from the neck to the shoulder, arm or hand on one side.  Acute neck pain usually gets better in one to two weeks. Neck pain related  to disk disease, arthritis in the spinal joints or spinal stenosis (narrowing of the spinal canal) can become chronic and last for months or years.  Unless you had a forceful physical injury (for example, a car accident or fall), X-rays are usually not ordered for the initial evaluation of neck pain. If pain continues and does not respond to medical treatment, x-rays and other tests may be performed at a later time.  Home Care:   Rest and relax the muscles. Use a comfortable pillow that supports the head and keeps the spine in a neutral position. The position of the head should not be tilted forward or backward. A rolled up towel may help for a custom fit.   Some persons find relief with heat (hot shower, hot bath or heating pad) and massage, while others prefer cold packs (crushed or cubed ice in a plastic bag, wrapped in a towel) . Try both and use the method that feels best for 20 minutes several times a day.   You may use acetaminophen (Tylenol) or ibuprofen (Motrin, Advil) to control pain, unless another medicine was prescribed. [ NOTE : If you have chronic liver or kidney disease or ever had a stomach ulcer or  GI bleeding, talk with your doctor before using these medicines.]  Follow Up  with your physician or this facility if your symptoms do not show signs of improvement after one week. Physical therapy or further tests may be needed.  [NOTE: A radiologist will review any X-rays or CT scans that were taken. We will notify you of any new findings that may affect your care.]  Get Prompt Medical Attention  if any of the following occur:   Pain becomes worse or spreads into one or both arms   Weakness or numbness in one or both arms   Increasing headache   Neck swelling, difficulty or painful swallowing   Fever of 100.53F (38C) or higher, or as directed by your healthcare provider   2000-2015 [employer]. [address] [place] [address]. All rights reserved. This information is  not intended as a substitute for professional medical care. Always follow your healthcare professional's instructions.        Neck Problems: Relieving Your Symptoms  The first goal of treatment is to relieve your symptoms. Your health care provider may recommend self-care treatments. These include resting, applying ice and heat, taking medication, and doing exercises. Your health care provider may also recommend that you see a physical therapist, who can teach you ways to care for and strengthen your neck.    Self-Care Treatments  Pain can end quickly or last awhile. Either way, you'll want relief as soon as possible. Your health care provider can tell you which treatments to do at home to help relieve your pain.   Lying downfor a short time takes pressure from the head off the neck.   Iceand heat can help reduce pain. To bring down swelling, rest an ice pack wrapped in a thin towel on your neck for 15 minutes. To relax sore muscles, apply a warm, wet towel to the area. Or take a warm bath or shower.   Over-the-counter medications,such as ibuprofen,naproxen, and aspirin,can help reduce pain andswelling. Acetaminophen can help relieve pain. Use these only as directed.   Exercisescan relax muscles and prevent stiffness. To prepare, drape a warm, wet towel around your neck and shoulders for 5 minutes. Remove the towel. Then do any exercises recommended to you by your health care provider.  Physical Therapy  If self-care treatments aren't helping relieve neck pain, your health care provider may suggest one or more sessions of physical therapy. Physical therapy is performed by a specialist trained to treat injuries. Your physical therapist (PT) will teach you how to strengthen muscles, improve the spine's alignment, and help you move properly. Treatment methods used in physical therapy may include:   Heat.A special heating pad called a neck pack may be applied to your neck.   Exercises.Your PT will teach  you exercises to help strengthen your neck and improve its range of motion.   Joint mobilization.The PT gently moves your vertebrae to help restore motion in your neck joints and reduce neck pain.   Soft tissue mobilization.The PT massages and stretches the muscles in your neck and shoulders.   Electrical stimulation.Electrical impulses are sent into your neck. This helps reduce soreness and inflammation.   Education in Economist.The PT shows you ways to position and move your body that protect the neck.  Other Treatments  If physical therapy doesn't relieve your neck pain, your health care provider may suggest other treatments. For example, medications or injections can help relieve pain and swelling. In some cases,  surgery may be needed to treat neck problems.   [address] [employer], [place] [address]. All rights reserved. This information is not intended as a substitute for professional medical care. Always follow your healthcare professional's instructions.        Pinched Nerve in theNeck  A pinched nerve in the neck (cervical radiculopathy) is caused when the nerve that goes from the spinal cord to the arm is irritated or has pressure on it. This may be caused by a bulging spinal disk. A spinal disk is the cushion between each spinal bone. Or it may be caused by a narrowing of the spinal joint because of arthritis.    A pinched nerve can cause numbness, tingling, deep aching, or electrical shooting pain from the side of the neck all the way down to the fingers on one side.  A pinched nerve may begin after a sudden turning or bending force (such as in a car accident) or after a simple awkward movement. In either case, muscle spasm is commonly present and adds to the pain.  Home care  Follow these guidelines when caring for yourself at home:   Rest and relax the muscles. Use a comfortable pillow that supports your head and keeps your spine in a natural  (neutral) position. Your head shouldn't be tilted forward or backward. A rolled-up towel may help for a custom fit.When standing or sitting, keep your neck in line with your body. Keep your head up and shoulders down. Stay away from activities that require you to move your neck a lot.   You can use heat and massage to help ease the pain. Take a hot shower or bath, or use a heating pad. You can also use a cold pack for relief. You can make a cold pack by wrapping a plastic bag of crushed or cubed ice in a thin towel. Try both heat and cold, and use the method that feels best. Do this for 20 minutes several times a day.   You may use acetaminophen or ibuprofen to control pain, unless another pain medicine was prescribed. If you have chronic liver or kidney disease, talk with your health care provider before using these medicines. Also talk with your provider if you've had a stomach ulcer or GI bleeding.   Reduce stress. Stress can make it longer for your pain to go away.   Do any exercises or stretches that were given to you as part of your discharge plan.   Wear a soft collar, if prescribed.   You may need surgery for a more serious injury.  Follow-up care  Follow up with your health care provider, or as advised, if you don't start to get better after 1 week. You may need more tests.Tell your provider about any fever, chills, or weight loss.  If X-rays were taken, a radiologist will look at them. You will be told of any new findings that may affect your care.  When to seek medical advice  Call your health care provider right away if any of these occur:   Pain becomes worse even after taking prescribed pain medicine   Weakness in the arm   Numbness in the arm gets worse   Trouble breathing or swallowing     2000-2015 [employer] [address], [place] [address]. All rights reserved. This information is not intended as a substitute for professional medical care. Always follow your  healthcare professional's instructions.

## 2013-11-27 NOTE — Progress Notes (Signed)
Vitals taken, medication and allergies verified. Patient roomed by: Bobbye Morton, MA-C    Reason for visit: neck and shoulder pain      Cervical screening/PAP:UTD  Mammo: UTD  Colon Screen: UTD  Have you seen a specialist since your last visit: YES   Patient did see a mental health specialist.      HM Due:   Health Maintenance   Topic Date Due   . TETANUS BOOSTER  02/20/2013   . MAMMOGRAM 50+  04/15/2015   . CHOLESTEROL-FEMALE  02/03/2018   . COLONOSCOPY Q 10 YEARS  11/11/2023   . ZOSTER VACCINE  Completed   . INFLUENZA VACCINE  Completed              Future Appointments  Date Time Provider [place]   01/20/2014 8:20 AM Velva Harman [facility]        Vaccine Screening Questions      1.  Have you had a serious reaction or an allergic reaction to a vaccine?  NO    2.  Currently have a moderate or severe illness, including fever? (Don't Ask if vaccine   ordered by provider same day)  NO    3.  Ever had a seizure or a brain or other nervous system problem syndrome associated with a vaccine? (DTaP/TDaP/DTP pertinent) NO    4.  Is patient receiving  any live vaccinations today? (Varicella-Chickenpox, MMR-Measles/Mumps/Rubella, Zoster-Shingles)  NO    If YES to any of the questions above - Do NOT give vaccine.  Consult with RN or provider in clinic.  (#4 can be YES if all Live vaccine questions are answered NO)    If NO to all questions above - Patient may receive vaccine.    5.  Do you need to receive the Flu vaccine today? NO    Vaccine information sheet(s) discussed, patient/parent/guardian verbalized understanding? YES     VIS given 11/27/2013 by Bobbye Morton, MA.    After obtaining informed consent, the immunization is given by Bobbye Morton, 11/27/2013 3:04 PM  .

## 2013-11-27 NOTE — Progress Notes (Signed)
Patient Referred By: No ref. provider found  Patient's PCP: Leary Roca (General)     Subjective:  Patient is a 63 year old female, here to discuss Neck Pain  Pt presents c/o right neck and shoulder pain for the last 2 months.  Pt states 2 months ago was fishing when she had to "wrestle" with a 15 lb salmon.  Since then she has had pain and spasm to her right shoulder.      Denies numbness, tingling or weakness to her arm.  Rates th pain as a 7/10 but able to sleep through the night.  Responds well to ibuprofen.  Pt went to chiropractor to have it adjusted and she thinks it made it worse.  Does not respond to heat or ice.  Pt requesting x-rays.     The following portions of the patient's history were reviewed with the patient and updated as appropriate: problem list, current medications, allergies, past medical history, past surgical history, past social history and past family history.    HPI  See above    Review of Systems   Constitutional: Negative for fever.   Respiratory: Negative for chest tightness and shortness of breath.    Cardiovascular: Negative for chest pain.   Gastrointestinal: Negative for nausea and vomiting.   Musculoskeletal: Positive for myalgias, arthralgias, neck pain and neck stiffness. Negative for joint swelling.   Skin: Negative for rash.   Neurological: Negative for weakness, numbness and headaches.   Psychiatric/Behavioral: Negative for sleep disturbance.     Outpatient Prescriptions Prior to Visit   Medication Sig Dispense Refill   . Azelastine HCl 0.05 % Ophthalmic Solution Place 1 drop in each EYE 2 times a day. This replaces Patanol 3 bottle 3   . Esomeprazole Magnesium (NEXIUM) 40 MG Oral CAPSULE DELAYED RELEASE TAKE 1 CAPSULE DAILY 90 Cap 3   . Fexofenadine HCl (ALLEGRA) 180 MG Oral Tab Take 1 tablet by mouth daily for allergies 90 Tab 3   . Fluticasone Propionate 50 MCG/ACT Nasal Suspension Spray 2 sprays into each nostril daily. For prevention/control of nasal/sinus  congestion 3 Inhaler 3     No facility-administered medications prior to visit.        Review of patient's allergies indicates:  Allergies   Allergen Reactions   . Amoxicillin Rash   . Levofloxacin Itching     Redness up arm from IV dose   . Penicillin G Rash   . Terbinafine Hcl      Loss of sense of taste     BP 107/69 mmHg  Pulse 69  Temp(Src) 98 F (36.7 C) (Oral)  Ht 5' 7.5" (1.715 m)  Wt 114 lb (51.71 kg)  BMI 17.58 kg/m2  SpO2 100%      Objective:  Physical Exam   Constitutional: She is oriented to person, place, and time. She appears well-developed and well-nourished.   Cardiovascular: Normal rate, regular rhythm and normal heart sounds.    Pulmonary/Chest: Effort normal and breath sounds normal.   Musculoskeletal: Normal range of motion. She exhibits no edema.        Right shoulder: She exhibits tenderness. She exhibits normal range of motion, no bony tenderness, no swelling, no effusion, no crepitus, no deformity, no laceration, no pain, no spasm, normal pulse and normal strength.        Cervical back: She exhibits tenderness and pain. She exhibits normal range of motion, no bony tenderness, no swelling, no edema, no deformity and no spasm.  Arms:  Neurological: She is alert and oriented to person, place, and time. She has normal reflexes. She displays no atrophy. No sensory deficit. She exhibits normal muscle tone.   Pain over the trapezius muscle.   Skin: Skin is warm and dry. No rash noted.   Psychiatric: She has a normal mood and affect. Her behavior is normal. Judgment and thought content normal.        Assessment and Plan:    1. Neck pain  R/O pathology, recommended PT or massage therapy. Pt instant on imaging will consider PT once resulted.   - XR C SPINE MIN 4 VIEWS  - X-RAY THORACIC SPINE 2 VW    2. Need for prophylactic vaccination with combined diphtheria-tetanus-pertussis (DTP) vaccine  HM  - Tdap vacc (adult) 0.5 mL IM - 27062    3. Radiculopathy of cervical region  Methocarbamol  as prescribed.     Patient Instructions   It was a pleasure to see you in clinic today. Your Medical Assistant was: [place] Citizen can schedule an appointment to see [place] by calling [phone] or via eCare.     Dear Paula Bowman,    It was a pleasure taking care of you today.  Below you will find information on your visit and any home instructions.  Please take the time to review your discharge paperwork.  If you have not already signed up for E-Care please do as this is the easiest and fastest way to communicate with me.  Once your lab results are ready I can also send them to you via E-Care for you to review.     I have ordered some x-rays of your back and neck.  I will call you with the results tomorrow once our radiologists have read it.  I know you are reluctant to be seen by Pt but I recommend that you consider this option if there is no signs of fracture on your xray.  You may also benefit from massage therapy.    Do not hesitate to call with any questions or concerns.     [employer].     Thank you for choosing [facility].      Neck Pain [No Trauma]  There are several possible causes of neck pain without injury:   You can get a minor ligament sprain or muscle strain from a sudden minor neck movement. Sleeping with your neck in an awkward position can also cause this.   Some persons respond to emotional stress by tensing the muscles of their neck, shoulders and upper back. Chronic spasm in these muscles can cause neck pain and sometimes headaches.   Gradual"wear and tear"of the joints in the spine can cause"degenerative arthritis."This can be a source of occasional or chronic neck pain.   With aging or repeated small injuries to the neck, the spinal disks (the cushions between each spinal bone) may bulge and put pressure on a nearby spinal nerve. This causes tingling, pain or numbness spreading from the neck to the shoulder, arm or hand on one side.  Acute neck pain usually gets  better in one to two weeks. Neck pain related to disk disease, arthritis in the spinal joints or spinal stenosis (narrowing of the spinal canal) can become chronic and last for months or years.  Unless you had a forceful physical injury (for example, a car accident or fall), X-rays are usually not ordered for the initial  evaluation of neck pain. If pain continues and does not respond to medical treatment, x-rays and other tests may be performed at a later time.  Home Care:   Rest and relax the muscles. Use a comfortable pillow that supports the head and keeps the spine in a neutral position. The position of the head should not be tilted forward or backward. A rolled up towel may help for a custom fit.   Some persons find relief with heat (hot shower, hot bath or heating pad) and massage, while others prefer cold packs (crushed or cubed ice in a plastic bag, wrapped in a towel) . Try both and use the method that feels best for 20 minutes several times a day.   You may use acetaminophen (Tylenol) or ibuprofen (Motrin, Advil) to control pain, unless another medicine was prescribed. [ NOTE : If you have chronic liver or kidney disease or ever had a stomach ulcer or GI bleeding, talk with your doctor before using these medicines.]  Follow Up  with your physician or this facility if your symptoms do not show signs of improvement after one week. Physical therapy or further tests may be needed.  [NOTE: A radiologist will review any X-rays or CT scans that were taken. We will notify you of any new findings that may affect your care.]  Get Prompt Medical Attention  if any of the following occur:   Pain becomes worse or spreads into one or both arms   Weakness or numbness in one or both arms   Increasing headache   Neck swelling, difficulty or painful swallowing   Fever of 100.11F (38C) or higher, or as directed by your healthcare provider   2000-2015 [employer]. [address] [place]  [address]. All rights reserved. This information is not intended as a substitute for professional medical care. Always follow your healthcare professional's instructions.        Neck Problems: Relieving Your Symptoms  The first goal of treatment is to relieve your symptoms. Your health care provider may recommend self-care treatments. These include resting, applying ice and heat, taking medication, and doing exercises. Your health care provider may also recommend that you see a physical therapist, who can teach you ways to care for and strengthen your neck.    Self-Care Treatments  Pain can end quickly or last awhile. Either way, you'll want relief as soon as possible. Your health care provider can tell you which treatments to do at home to help relieve your pain.   Lying downfor a short time takes pressure from the head off the neck.   Iceand heat can help reduce pain. To bring down swelling, rest an ice pack wrapped in a thin towel on your neck for 15 minutes. To relax sore muscles, apply a warm, wet towel to the area. Or take a warm bath or shower.   Over-the-counter medications,such as ibuprofen,naproxen, and aspirin,can help reduce pain andswelling. Acetaminophen can help relieve pain. Use these only as directed.   Exercisescan relax muscles and prevent stiffness. To prepare, drape a warm, wet towel around your neck and shoulders for 5 minutes. Remove the towel. Then do any exercises recommended to you by your health care provider.  Physical Therapy  If self-care treatments aren't helping relieve neck pain, your health care provider may suggest one or more sessions of physical therapy. Physical therapy is performed by a specialist trained to treat injuries. Your physical therapist (PT) will teach you how to strengthen  muscles, improve the spine's alignment, and help you move properly. Treatment methods used in physical therapy may include:   Heat.A special heating pad called a neck pack may be applied  to your neck.   Exercises.Your PT will teach you exercises to help strengthen your neck and improve its range of motion.   Joint mobilization.The PT gently moves your vertebrae to help restore motion in your neck joints and reduce neck pain.   Soft tissue mobilization.The PT massages and stretches the muscles in your neck and shoulders.   Electrical stimulation.Electrical impulses are sent into your neck. This helps reduce soreness and inflammation.   Education in Economist.The PT shows you ways to position and move your body that protect the neck.  Other Treatments  If physical therapy doesn't relieve your neck pain, your health care provider may suggest other treatments. For example, medications or injections can help relieve pain and swelling. In some cases, surgery may be needed to treat neck problems.   [address] [employer], [place] [address]. All rights reserved. This information is not intended as a substitute for professional medical care. Always follow your healthcare professional's instructions.        Pinched Nerve in theNeck  A pinched nerve in the neck (cervical radiculopathy) is caused when the nerve that goes from the spinal cord to the arm is irritated or has pressure on it. This may be caused by a bulging spinal disk. A spinal disk is the cushion between each spinal bone. Or it may be caused by a narrowing of the spinal joint because of arthritis.    A pinched nerve can cause numbness, tingling, deep aching, or electrical shooting pain from the side of the neck all the way down to the fingers on one side.  A pinched nerve may begin after a sudden turning or bending force (such as in a car accident) or after a simple awkward movement. In either case, muscle spasm is commonly present and adds to the pain.  Home care  Follow these guidelines when caring for yourself at home:   Rest and relax the muscles. Use a comfortable pillow that supports  your head and keeps your spine in a natural (neutral) position. Your head shouldn't be tilted forward or backward. A rolled-up towel may help for a custom fit.When standing or sitting, keep your neck in line with your body. Keep your head up and shoulders down. Stay away from activities that require you to move your neck a lot.   You can use heat and massage to help ease the pain. Take a hot shower or bath, or use a heating pad. You can also use a cold pack for relief. You can make a cold pack by wrapping a plastic bag of crushed or cubed ice in a thin towel. Try both heat and cold, and use the method that feels best. Do this for 20 minutes several times a day.   You may use acetaminophen or ibuprofen to control pain, unless another pain medicine was prescribed. If you have chronic liver or kidney disease, talk with your health care provider before using these medicines. Also talk with your provider if you've had a stomach ulcer or GI bleeding.   Reduce stress. Stress can make it longer for your pain to go away.   Do any exercises or stretches that were given to you as part of your discharge plan.   Wear a soft collar, if prescribed.  You may need surgery for a more serious injury.  Follow-up care  Follow up with your health care provider, or as advised, if you don't start to get better after 1 week. You may need more tests.Tell your provider about any fever, chills, or weight loss.  If X-rays were taken, a radiologist will look at them. You will be told of any new findings that may affect your care.  When to seek medical advice  Call your health care provider right away if any of these occur:   Pain becomes worse even after taking prescribed pain medicine   Weakness in the arm   Numbness in the arm gets worse   Trouble breathing or swallowing     2000-2015 [employer] [address], [place] [address]. All rights reserved. This information is not intended as a substitute for  professional medical care. Always follow your healthcare professional's instructions.

## 2013-12-02 ENCOUNTER — Encounter (INDEPENDENT_AMBULATORY_CARE_PROVIDER_SITE_OTHER): Payer: Self-pay | Admitting: Family

## 2013-12-16 ENCOUNTER — Telehealth (INDEPENDENT_AMBULATORY_CARE_PROVIDER_SITE_OTHER): Payer: Self-pay | Admitting: Internal Medicine

## 2013-12-16 ENCOUNTER — Encounter (INDEPENDENT_AMBULATORY_CARE_PROVIDER_SITE_OTHER): Payer: Self-pay | Admitting: Internal Medicine

## 2013-12-16 NOTE — Telephone Encounter (Signed)
Patient needs recent xray images on CD.  Xray machine is down for maintenance right now.    I will make the CD ASAP, and patient will return to pick up CD this afternoon before 2pm.

## 2013-12-16 NOTE — Telephone Encounter (Signed)
CD made and placed at [facility].

## 2013-12-24 ENCOUNTER — Ambulatory Visit (INDEPENDENT_AMBULATORY_CARE_PROVIDER_SITE_OTHER): Payer: 59 | Admitting: Internal Medicine

## 2013-12-24 ENCOUNTER — Encounter: Payer: Self-pay | Admitting: Internal Medicine

## 2013-12-24 VITALS — BP 110/86 | HR 78 | Temp 98.3°F | Resp 13 | Wt 187.2 lb

## 2013-12-24 DIAGNOSIS — M7541 Impingement syndrome of right shoulder: Secondary | ICD-10-CM

## 2013-12-24 NOTE — Patient Instructions (Signed)
Use an anti-inflammatory cream such as Aspercreme or Zostrix cream twice a day to the affected area as needed. In lieu of this warm moist compresses or  hot water bottle can be used. Do not apply ice . The Sports Medicine referral will be scheduled and you'll be notified of the time.Please call the Referral Co-Ordinator @ [phone] if you have not been notified of appointment time within 7-10 days.

## 2013-12-24 NOTE — Progress Notes (Signed)
Pre visit review using our clinic review tool, if applicable. No additional management support is needed unless otherwise documented below in the visit note. 

## 2013-12-24 NOTE — Progress Notes (Signed)
   Subjective:    Patient ID: Catherine Mora, female    DOB: 1951/01/29, 63 y.o.   MRN: [id number]  HPI   She describes pain in the left shoulder area since September 4; there was no definite trigger or injury for the pain. She had lifted her grandson who weighs 42 pounds intermittently.  It is localized to the posterior superior shoulder area and is worse with posterior rotation of the shoulder. It's progressed to the point that she is unable to fasten her brassiere.  The pain lasts seconds and resolves with change in position. She also has occasional radicular pain that will extend down the left upper extremity and also into the left upper chest but this is much less frequent.   Review of Systems  She denies change in color or temperature of the skin.  She's had no associated rash.  There is no joint stiffness  She has no constitutional symptoms  There is no numbness, tingling,or weakness in the extremities.       Objective:   Physical Exam   Pertinent or positive findings include: She has pain only with active  posterior rotation of the left shoulder or posterior range of motion testing passively. Strength, tone, and deep tendon reflexes are normal. Cranial nerve exam WNL. She is able to extend both arms overhead without limitation or pain.  General appearance :adequately nourished; in no distress. Eyes: No conjunctival inflammation or scleral icterus is present. Heart:  Normal rate and regular rhythm. S1 and S2 normal without gallop, murmur, click, rub or other extra sounds   Lungs:Chest clear to auscultation; no wheezes, rhonchi,rales ,or rubs present.No increased work of breathing.  Vascular : all pulses equal ; no bruits present. Skin:Warm & dry.  Intact without suspicious lesions or rashes ; no jaundice or tenting Lymphatic: No lymphadenopathy is noted about the head, neck, axilla.             Assessment & Plan:  #1 shoulder impingement syndrome  See  orders and recommendations

## 2014-01-13 ENCOUNTER — Encounter: Payer: Self-pay | Admitting: Family Medicine

## 2014-01-13 ENCOUNTER — Other Ambulatory Visit (INDEPENDENT_AMBULATORY_CARE_PROVIDER_SITE_OTHER): Payer: 59

## 2014-01-13 ENCOUNTER — Ambulatory Visit (INDEPENDENT_AMBULATORY_CARE_PROVIDER_SITE_OTHER): Payer: 59 | Admitting: Family Medicine

## 2014-01-13 VITALS — BP 140/84 | HR 95 | Ht 70.0 in | Wt 191.0 lb

## 2014-01-13 DIAGNOSIS — M7502 Adhesive capsulitis of left shoulder: Secondary | ICD-10-CM | POA: Insufficient documentation

## 2014-01-13 DIAGNOSIS — M25512 Pain in left shoulder: Secondary | ICD-10-CM

## 2014-01-13 NOTE — Patient Instructions (Signed)
Good to see you Ice 20 minutes 2 times daily. Usually after activity and before bed. Exercises 3 times a week.  Vitamin D 2000 IU daily Turmeric 500mg  twice daily.  Try the pennsaid twice daily as needed See me again in 3 weeks.

## 2014-01-13 NOTE — Assessment & Plan Note (Signed)
Patient was given an injection today. We discussed icing protocol, we discussed home exercises and showed proper technique. We discussed the importance of maintaining range of motion. We discussed over-the-counter medications and natural supplementation the can also be beneficial. We discussed what activities to potentially avoid. Patient come back again in 3 weeks. Depending on how patient is doing we may need to consider another intra-articular injection. We also may need to consider formal physical therapy. Patient did have some mild increase in range of motion immediately.

## 2014-01-13 NOTE — Progress Notes (Signed)
[facility] [address], [place] [address] Phone: [phone] Subjective:    I'm seeing this patient by the request  of:  Walker Kehr, MD   CC: Left shoulder pain  [facility]:[id number] Catherine Mora is a 63 y.o. female coming in with complaint of left shoulder pain. Patient states she's had it for approximate 4 months. Patient states it is more of an insidious onset and does not remember any true injury. Patient states that over the course of time she's had increasing amount of pain and now is starting to lose some of her motion. Denies any significant weakness though. Patient will find it difficult to do overhead activity or reach behind her back. Patient is unable to do her bra strap anymore. Patient has tried some icing with minimal benefit. Patient rates the severity of pain a 7 out of 10. It is starting to wake her up at night. No radiation the pain.     Past medical history, social, surgical and family history all reviewed in electronic medical record.   Review of Systems: No headache, visual changes, nausea, vomiting, diarrhea, constipation, dizziness, abdominal pain, skin rash, fevers, chills, night sweats, weight loss, swollen lymph nodes, body aches, joint swelling, muscle aches, chest pain, shortness of breath, mood changes.   Objective Blood pressure 140/84, pulse 95, height 5\' 10"  (1.778 m), weight 191 lb (86.637 kg), SpO2 94 %.  General: No apparent distress alert and oriented x3 mood and affect normal, dressed appropriately.  HEENT: Pupils equal, extraocular movements intact  Respiratory: Patient's speak in full sentences and does not appear short of breath  Cardiovascular: No lower extremity edema, non tender, no erythema  Skin: Warm dry intact with no signs of infection or rash on extremities or on axial skeleton.  Abdomen: Soft nontender  Neuro: Cranial nerves II through XII are intact, neurovascularly intact in all extremities with  2+ DTRs and 2+ pulses.  Lymph: No lymphadenopathy of posterior or anterior cervical chain or axillae bilaterally.  Gait normal with good balance and coordination.  MSK:  Non tender with full range of motion and good stability and symmetric strength and tone of shoulders, elbows, wrist, hip, knee and ankles bilaterally.  Shoulder: left Inspection reveals no abnormalities, atrophy or asymmetry. Palpation is normal with no tenderness over AC joint or bicipital groove. Range of motion passively has for flexion 220, internal rotation to sacrum, external rotation of 5 Rotator cuff strength normal throughout. signs of impingement with positive Neer and Hawkin's tests, but negative empty can sign. Speeds and Yergason's tests normal. No labral pathology noted with negative Obrien's, negative clunk and good stability. Normal scapular function observed. No painful arc and no drop arm sign. No apprehension sign Contralateral shoulder unremarkable  MSK US performed of: left This study was ordered, performed, and interpreted by Charlann Boxer D.O.  Shoulder:   Supraspinatus:  Patient does have a very small 10% tear on the articular surface of the supraspinatus at its insertion with no retraction. Bursal bulge seen with shoulder abduction on impingement view. Infraspinatus:  Appears normal on long and transverse views. Significant increase in Doppler flow Subscapularis:  Mild degenerative changes with calcific changes. Positive bursa Teres Minor:  Appears normal on long and transverse views. AC joint:  Capsule undistended, no geyser sign. Glenohumeral Joint:  Appears normal without effusion. Significant thickness of the capsule noted. Glenoid Labrum:  Intact without visualized tears. Biceps Tendon:  Appears normal on long and transverse views, no  fraying of tendon, tendon located in intertubercular groove, no subluxation with shoulder internal or external rotation.  Impression: Subacromial bursitis,  small supraspinatus articular side tear with no retraction, and adhesive capsulitis  Procedure: Real-time Ultrasound Guided Injection of left glenohumeral joint Device: GE Logiq E  Ultrasound guided injection is preferred based studies that show increased duration, increased effect, greater accuracy, decreased procedural pain, increased response rate with ultrasound guided versus blind injection.  Verbal informed consent obtained.  Time-out conducted.  Noted no overlying erythema, induration, or other signs of local infection.  Skin prepped in a sterile fashion.  Local anesthesia: Topical Ethyl chloride.  With sterile technique and under real time ultrasound guidance:  Joint visualized.  23g 1  inch needle inserted posterior approach. Pictures taken for needle placement. Patient did have injection of 2 cc of 1% lidocaine, 2 cc of 0.5% Marcaine, and 1.0 cc of Kenalog 40 mg/dL. Completed without difficulty  Pain immediately resolved suggesting accurate placement of the medication.  Advised to call if fevers/chills, erythema, induration, drainage, or persistent bleeding.  Images permanently stored and available for review in the ultrasound unit.  Impression: Technically successful ultrasound guided injection.    Impression and Recommendations:     This case required medical decision making of moderate complexity.

## 2014-01-20 ENCOUNTER — Encounter (INDEPENDENT_AMBULATORY_CARE_PROVIDER_SITE_OTHER): Payer: Self-pay | Admitting: Internal Medicine

## 2014-01-20 ENCOUNTER — Ambulatory Visit (INDEPENDENT_AMBULATORY_CARE_PROVIDER_SITE_OTHER): Payer: PRIVATE HEALTH INSURANCE | Admitting: Internal Medicine

## 2014-01-20 VITALS — BP 103/65 | HR 74 | Temp 97.0°F | Ht 67.5 in | Wt 115.0 lb

## 2014-01-20 DIAGNOSIS — Z0000 Encounter for general adult medical examination without abnormal findings: Secondary | ICD-10-CM

## 2014-01-20 DIAGNOSIS — M542 Cervicalgia: Secondary | ICD-10-CM

## 2014-01-20 DIAGNOSIS — Z1211 Encounter for screening for malignant neoplasm of colon: Secondary | ICD-10-CM

## 2014-01-20 NOTE — Progress Notes (Signed)
Vitals taken, medication and allergies verified. Patient roomed by: Bobbye Morton, MA-C      Reason for visit: physical and wishes to get blood work along with a referral to PT      Cervical screening/PAP:n/a  Mammo: may be UTD  Colon Screen: declined  Have you seen a specialist since your last visit: unknown        HM Due:   Health Maintenance   Topic Date Due   . HEPATITIS C SCREENING  [birth year]   . HIV SCREENING  04/09/1965   . MAMMOGRAM 50+  04/15/2015   . CHOLESTEROL-FEMALE  02/03/2018   . COLONOSCOPY Q 10 YEARS  11/11/2023   . TETANUS BOOSTER  11/28/2023   . ZOSTER VACCINE  Completed   . INFLUENZA VACCINE  Completed              No future appointments.

## 2014-01-20 NOTE — Patient Instructions (Addendum)
It was a pleasure to see you in clinic today. Your Medical Assistant was: Henrene Dodge       Physical therapy.          You can schedule an appointment to see [place] by calling [phone] or via eCare.     If labs were ordered today the results are expected to be available via eCare 5 days later.     Thank you for choosing [facility].

## 2014-01-20 NOTE — Progress Notes (Signed)
Paula Bowman is a 63 year old here for a wellness examination.     She hurt her  neck in August.  Pulling in a salmon she felt a pulling pain in the right lateral neck.  She has tried chiropractic and acupuncture.    Has a massage arranged also.   Pain in in trapezius distribution.  Sometimes medial to the scapula.     She is a  retired Catering manager and has some underlying shoulder girdle neck symptoms since then.     Has low glucose feeling when she goes to play pickleball.  Occurs on and off for a year.    We discussed varying breakfasts that she has.        ROS:   Constitutional: weight steady    Eyes:  Sometimes feels vision is fuzzy once or twice per month over a year.  Eyes tested last year.    Ears, Nose, Mouth, Throat:  No hearing problems.  Occasional tinnitus. Seasonal hayfever   Cardiovascular: No chest pains.     Respiratory: No dyspnea    Gastrointestinal: diarrhea resolved    Genitourinary: No vaginal bleeding or irritation;  Has ovaries, but no cervix.    Musculoskeletal: neck pain as above    Skin: Eczema comes and goes.  Good since move from [place].  But having some increase recently on palm of hand.  Stress  After the incident in [place] has brought it up again.    Neurological:  No headaches.   Psychiatric: stress    Endocrine: glucose issues as above   Hematologic/Lymphatic:  No change in bruising.     Allergic/Immunologic: No swollen glands.      Patient Active Problem List   Diagnosis   . PERS HX OF BREAST MALIGNANCY   . Anxiety state, unspecified   . Other chronic allergic conjunctivitis   . GERD (gastroesophageal reflux disease)   . Eczema   . Allergic rhinitis   . Onychomycosis   . Back pain   . 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 Neck pain   . Need for prophylactic vaccination with combined diphtheria-tetanus-pertussis (DTP) vaccine   . Radiculopathy of cervical region      Past Medical History   Diagnosis Date   . Malignant neoplasm of breast (female),  unspecified site 2008     lumpectomy: Stage 0; Radiation; was ERP; She did not complete Tamoxifen due to side effects x1 yr.    . Esophageal reflux       Past Surgical History   Procedure Laterality Date   . Vaginal hysterectomy uterus 250 gm/<  1994     Ovaries in place   . Knee, r knee at age 74, removed floating bone     . Total abdominal hysterect w/wo rmvl tube ovary       for fibroids   . Colonoscopy stoma dx including collj spec spx  2006     [place] , FU 10 years   . Hernia repair     . Breast lumpectomy     . Unlisted procedure femur/knee Right 1969      Family History   Problem Relation Age of Onset   . Cancer Sister      Breast cancer at 50. Was not tested for BRCA   . Colon Cancer No Hx Of       History   Substance Use Topics   . Smoking status: Former Smoker -- 2.00  packs/day for 15 years     Types: Cigarettes     Quit date: 01/07/1988   . Smokeless tobacco: Never Used   . Alcohol Use: 0.5 oz/week     1 Not specified per week      Comment: 2 glasses of wine daily        Exam  BP 103/65 mmHg  Pulse 74  Temp(Src) 97 F (36.1 C) (Temporal)  Ht 5' 7.5" (1.715 m)  Wt 115 lb (52.164 kg)  BMI 17.74 kg/m2  SpO2 100%   HEAD:  Head atraumatic.  Eyes without conjunctival irritation. EOMI .  Neck supple   ENT:  External ears normal.  Tympanic membranes and canals normal bilaterally.  Pharynx clear without exudates or mucosal lesions.  No cervical adenopathy   CHEST:  Normal contour.  Auscultation shows normal vesicular breath sounds.  CARDIAC:  Regular rate and rhythm.  S1 and S2 normal.  No murmers, rubs or gallops.   Breast:  Where surgery done right side there is induration which patient notes has been there.    Abdomen is soft, non-distended, non-tender. No rebound or guarding. No organomegaly.  Extremities:  No clubbing, cyanosis or edema.       Assessment  Encounter Diagnoses   Name Primary?   . Routine medical exam Yes   . Neck pain    . Screening for colon cancer       Plans  Physical therapy        FIT kit for colon screening.

## 2014-01-21 ENCOUNTER — Ambulatory Visit (INDEPENDENT_AMBULATORY_CARE_PROVIDER_SITE_OTHER): Payer: PRIVATE HEALTH INSURANCE | Admitting: Obstetrics & Gynecology

## 2014-01-21 VITALS — BP 174/94 | HR 68 | Temp 97.4°F | Resp 16 | Wt 115.0 lb

## 2014-01-21 DIAGNOSIS — S0093XA Contusion of unspecified part of head, initial encounter: Secondary | ICD-10-CM

## 2014-01-21 DIAGNOSIS — S161XXA Strain of muscle, fascia and tendon at neck level, initial encounter: Secondary | ICD-10-CM

## 2014-01-21 DIAGNOSIS — F410 Panic disorder [episodic paroxysmal anxiety] without agoraphobia: Secondary | ICD-10-CM

## 2014-01-21 MED ORDER — DICLOFENAC SODIUM 1 % TD GEL
4.0000 g | Freq: Four times a day (QID) | TRANSDERMAL | Status: DC
Start: 2014-01-21 — End: 2014-10-24

## 2014-01-21 NOTE — Progress Notes (Signed)
Verified allergies, medications, vitals , Emmanuell Kantz, MA

## 2014-01-21 NOTE — Progress Notes (Signed)
.  Paula Bowman is an 63 year old female who presents for evaluation of "panic attack". It all started because she struck her head hard against a granite counter about 1 pm today when she bent down to put dog bowl on floor and then hit her head on the way up. She felt the pain in her neck as well as she has chronic neck pain; takes Aleve for it, quite a bit lately. She took herself to a massage therapist who was working on her neck and pt developed a sense of"difficulty breathing and shaking" and felt very cold. Her neck actually feels better now. She used to have "panic attacks" years ago but none in the last 5 years; she used to take "imipramine" for them. She denies headache, vision changes, weakness, dizziness. There was no blood coming from her head. She is now worried about her blood pressure which is elevated today. She had a normal physical exam yesterday and her BP was excellent. She works as a Catering manager.    I have personally reviewed the past medical and surgical histories, allergies, medications, and social history with the pt today.    ROS:   Constitutional: Negative for fatigue   Cardiovascular: Negative for chest pain and palpitations    Respiratory: Negative for shortness of breath, wheezing, cough   Gastrointestinal: Positive for h/o GERD.    EXAM:  BP 174/94 mmHg  Pulse 68  Temp(Src) 97.4 F (36.3 C) (Tympanic)  Resp 16  Wt 115 lb (52.164 kg)  SpO2 100%  General appearance: healthy, alert, mild distress: apprehensive  HENT: palpable nodule on scalp on nuchal area about 1 cm diam, slightly TTP, skin is intact with a minor superficial abrasion; PERRL, sclera clear; nares patent; oropharynx is clear, uvula midline  Neck: supple and no adenopathy; mild TTP over the paraspinous muscles  Lungs: clear to auscultation  Heart: normal rate, regular rhythm and no murmurs, clicks, or gallops  Neuro exam: CN 2-12 WNL; Romberg negative, tandem walk and tip-toeing normal; finger to nose and finger to  finger tests are negative, rapid alternating movements are normal, one leg stand is normal.  Upper and lower extremities with normal strength.    ASSESSMENT/PLAN:    ---Panic Attack  ---Head contusion  ---Neck strain, initial encounter  Plan: Diclofenac Sodium 1 % Transdermal Gel  Stop taking Aleve if use topical gel.    If sxs persist, or worsen, call or RTC for further evaluation.  The patient indicates understanding of these issues and agrees with the plan.

## 2014-01-21 NOTE — Patient Instructions (Signed)
It was a pleasure to see you in clinic today. Your medical assistant was Manasvi Dickard M        You can schedule an appointment to see us by calling [phone] or via eCare.     If labs were ordered today the results are expected to be available via eCare 5 days later. Otherwise, result letters are mailed 7-10 days after your tests are completed. If your physician needs to change your care based on your results, you will receive a phone call to notify you. If you haven't heard from him/her and it has been more than 10 days please give us a call.     Thank you for choosing [facility]

## 2014-02-11 ENCOUNTER — Encounter (INDEPENDENT_AMBULATORY_CARE_PROVIDER_SITE_OTHER): Payer: Self-pay | Admitting: Internal Medicine

## 2014-02-16 ENCOUNTER — Other Ambulatory Visit (INDEPENDENT_AMBULATORY_CARE_PROVIDER_SITE_OTHER): Payer: Self-pay | Admitting: Internal Medicine

## 2014-02-16 DIAGNOSIS — Z1211 Encounter for screening for malignant neoplasm of colon: Secondary | ICD-10-CM

## 2014-02-17 LAB — OCCULT BLOOD BY IA, STL: Occult Bld 1 Result: NEGATIVE

## 2014-02-24 ENCOUNTER — Encounter (INDEPENDENT_AMBULATORY_CARE_PROVIDER_SITE_OTHER): Payer: Self-pay | Admitting: Internal Medicine

## 2014-02-24 DIAGNOSIS — M542 Cervicalgia: Secondary | ICD-10-CM

## 2014-02-24 DIAGNOSIS — M25511 Pain in right shoulder: Secondary | ICD-10-CM

## 2014-02-24 NOTE — Telephone Encounter (Signed)
Sent message suggesting sports and spine referral.   Phone patient with the eCare message if she does not read it.

## 2014-02-24 NOTE — Telephone Encounter (Signed)
Pt was seen for a Wellness 01/20/14. She c/o of neck and shoulder pain at the time. She has been in PT for 1 month with no significant change in pain. She is requesting imaging at this time. Please advise

## 2014-02-25 NOTE — Telephone Encounter (Signed)
Not read yet. Will check status tomorrow. If not read tomorrow, then please contact pt via phone.

## 2014-02-26 NOTE — Telephone Encounter (Signed)
LMTCB- Please transfer to [id number] (Rozanne Heumann) or 1760 (Kris). Thank you

## 2014-02-26 NOTE — Telephone Encounter (Signed)
Please see pt's response. Pt called back and she is very unhappy with not being able to have the MRI ordered first. I explained to pt the reasoning behind Dr. Fritz Pickerel suggestion (a specialist would be able to determine the appropriate testing, or even if imaging needed to be done) but she is still unhappy with this.

## 2014-02-26 NOTE — Telephone Encounter (Signed)
To referral coordinator to assist with getting MRIs scheduled.

## 2014-03-08 ENCOUNTER — Encounter (INDEPENDENT_AMBULATORY_CARE_PROVIDER_SITE_OTHER): Payer: Self-pay | Admitting: Internal Medicine

## 2014-03-10 ENCOUNTER — Telehealth (INDEPENDENT_AMBULATORY_CARE_PROVIDER_SITE_OTHER): Payer: Self-pay | Admitting: Internal Medicine

## 2014-03-10 NOTE — Telephone Encounter (Signed)
REASON FOR REQUEST: C-Spine and Shoulder MRI    MESSAGE: Dr. Annamaria Boots ordered two MRIs on 02/26/14, however, only one was done (C-Spine).  I called [facility] and they stated that they do have both orders, but after some research, they do not know why shoulder MRI wasn't done.  I called pt and left msg on her v/m asking her to call me back to find out why she didn't have shoulder MRI done.      CCR:  If pt calls back, pls transfer to [id number].  If I am not there, pls ask pt if she is going to have her should MRI done.  Thx.

## 2014-03-12 NOTE — Telephone Encounter (Signed)
OK.  Well if it was patient's decision to not do the shoulder MRI, we'll just note that and cancel order.  BUT, if it was just an oversight, have them schedule her.

## 2014-03-12 NOTE — Telephone Encounter (Signed)
Dr. Annamaria Boots - I called pt again and left another msg asking her to call back regarding her imaging.  [facility] did have both orders, but they are unsure why pt only had the C-spine MRI done.

## 2014-03-13 NOTE — Telephone Encounter (Signed)
I have documented in chart and have also sent pt a letter (see MRI referral for letter).

## 2014-03-16 ENCOUNTER — Encounter (INDEPENDENT_AMBULATORY_CARE_PROVIDER_SITE_OTHER): Payer: Self-pay | Admitting: Internal Medicine

## 2014-03-16 NOTE — Telephone Encounter (Signed)
FYI to Dr. Annamaria Boots - pt declined doing another MRI

## 2014-03-18 NOTE — Telephone Encounter (Signed)
Noted  

## 2014-03-23 ENCOUNTER — Encounter (INDEPENDENT_AMBULATORY_CARE_PROVIDER_SITE_OTHER): Payer: Self-pay | Admitting: Internal Medicine

## 2014-03-23 DIAGNOSIS — M25569 Pain in unspecified knee: Secondary | ICD-10-CM

## 2014-03-23 NOTE — Telephone Encounter (Signed)
Patient would like to discuss going back to the sports clinic for knee pain. Please see eCare message.

## 2014-03-26 NOTE — Telephone Encounter (Signed)
To referral coordinator.  Let patient know when you have processed referral.

## 2014-03-27 ENCOUNTER — Ambulatory Visit (HOSPITAL_BASED_OUTPATIENT_CLINIC_OR_DEPARTMENT_OTHER): Payer: BLUE CROSS/BLUE SHIELD | Admitting: Nurse Practitioner

## 2014-03-27 NOTE — Telephone Encounter (Signed)
reviewed patient Paula Bowman The member  does not have active coverage.  Provider mentions work injury.  Please sent message need updated insurance.

## 2014-04-06 ENCOUNTER — Encounter (HOSPITAL_BASED_OUTPATIENT_CLINIC_OR_DEPARTMENT_OTHER): Payer: Self-pay | Admitting: Rehabilitative and Restorative Service Providers"

## 2014-04-06 ENCOUNTER — Telehealth (HOSPITAL_BASED_OUTPATIENT_CLINIC_OR_DEPARTMENT_OTHER): Payer: Self-pay | Admitting: Rehabilitative and Restorative Service Providers"

## 2014-04-06 NOTE — Telephone Encounter (Signed)
CONFIRMED PHONE NUMBER: [phone]  CALLERS FIRST AND LAST NAME: [place]  FACILITY NAME: n/a TITLE: n/a  CALLERS RELATIONSHIP:Self  RETURN CALL: General message OK; details on VM okay     SUBJECT: General Message   REASON FOR REQUEST: medical question    MESSAGE: Patient would like to know if she can have an MRI before her appointment on 2/26. Patient requests to speak to [facility], directly. Please follow up.

## 2014-04-07 NOTE — Telephone Encounter (Signed)
Pt's up coming appointment is a week from Thursday, in 04/17/14 at 1:30pm.     Pt was last assessed by Ricci Barker PA-C on 09/01/13 for Right knee OA. Per Carrol's note from that visit:    "RADIOGRAPHIC IMAGING STUDIES  4 view right knee x-ray were ordered in clinic today. I personally reviewed these x-rays and my interpretation is as follows: They demonstrate mild tricompartmental osteoarthritis worst in the patellar femoral compartment.     A/P Right knee osteoarthritis  1. 4 view right knee x-ray  2. Discussed with the patient bracing and taping options for the patellar femoral pain.   3. Referral to physical therapy  4. Consider injection options   5. Follow up as needed"    Pt also sent eCare message stating:  "I'm coming in on 26th to see you again about my right knee. It's been x-rayed and didn't show anything. It has deteriorated to stabbing pain daily and it is affecting my everyday walking and pickle ball. I've tried wearing knee braces but that seems to press on it and aggravate it even more. I was wondering if i could have an MRI scheduled before i come in. then we'd know for sure what's going on. Thanks, Paula Bowman"      Routing to [employer] PA-C:  1) Pt requesting referral to MRI of Right Knee prior to her appointment.  2) Pt requesting to speak to Carrol directly or to reply to eCare message    Haynes Dage RN  [place] at [facility]

## 2014-04-07 NOTE — Telephone Encounter (Signed)
Message sent to [facility] PA-C in TE from same day, 04/06/14.    Haynes Dage RN  [place] at [facility]

## 2014-04-09 NOTE — Telephone Encounter (Signed)
CONFIRMED PHONE NUMBER: [phone]  CALLERS FIRST AND LAST NAME: Debbora Presto  FACILITY NAME: n/a TITLE: n/a  CALLERS RELATIONSHIP:Self  RETURN CALL: Detailed message on voicemail only     SUBJECT: General Message   REASON FOR REQUEST: status checkin    MESSAGE: Patient is still waiting to get a response from ecare message.  Patient is disappointed she hasn't heard back yet.  Please advise, thank you.

## 2014-04-09 NOTE — Telephone Encounter (Signed)
Returned the patient's phone call. Left a message.    Ricci Barker, ATC, PA-C

## 2014-04-10 ENCOUNTER — Other Ambulatory Visit (HOSPITAL_BASED_OUTPATIENT_CLINIC_OR_DEPARTMENT_OTHER): Payer: Self-pay | Admitting: Rehabilitative and Restorative Service Providers"

## 2014-04-10 DIAGNOSIS — M1711 Unilateral primary osteoarthritis, right knee: Secondary | ICD-10-CM

## 2014-04-10 NOTE — Telephone Encounter (Signed)
Patient wants to pursue PT/ explained to patient I would ask Alphonzo Lemmings to write this order today and that I would fax the order to her local PT/ Patient is frustrated that she is unable to get a MRI/ patient states she is barely able to walk. Patient has cancelled her appt on 2/26 and has decided to go to PT prior to making another appointment patient would like to speak to [facility] and can be reached on her Cell phone [phone]  Patient will call me with a fax number for her referral.  Char Prideaux-Geiszler, PSS/PCC [place]

## 2014-04-10 NOTE — Telephone Encounter (Signed)
Has had a sudden increase in knee pain without new injury. Has been helped by daily ibuprofen with mild relief. Has not been able to continue with daily walks or pickle ball. Has had catching and giving way with sharp shooting pain in the knee. She did not attend physical therapy following appointment in July.    New referral for physical therapy written today. She will try physical therapy and if no improvement will order MRI prior to next follow up.    Patient expressed agreement with this plan.    Ricci Barker, ATC, PA-C

## 2014-04-10 NOTE — Telephone Encounter (Signed)
Routing to [employer], per conversation Char had with the Pt:  1) Pt would like to pursue PT to get approval for MRI  2) Need referral for PT  3) Pt would like to speak to [employer]. Please call Pt on cell phone at [phone].      Haynes Dage RN  [place] at [facility]    -------------------------------------------------------------------------------------------    Pt last saw Ricci Barker PA-C on 09/01/13 for OA of right knee. Per her note from that visit:    "RADIOGRAPHIC IMAGING STUDIES  4 view right knee x-ray were ordered in clinic today. I personally reviewed these x-rays and my interpretation is as follows: They demonstrate mild tricompartmental osteoarthritis worst in the patellar femoral compartment.     A/P Right knee osteoarthritis  1. 4 view right knee x-ray  2. Discussed with the patient bracing and taping options for the patellar femoral pain.   3. Referral to physical therapy  4. Consider injection options   5. Follow up as needed"

## 2014-04-13 ENCOUNTER — Telehealth (HOSPITAL_BASED_OUTPATIENT_CLINIC_OR_DEPARTMENT_OTHER): Payer: Self-pay | Admitting: Rehabilitative and Restorative Service Providers"

## 2014-04-13 ENCOUNTER — Telehealth (INDEPENDENT_AMBULATORY_CARE_PROVIDER_SITE_OTHER): Payer: Self-pay | Admitting: Internal Medicine

## 2014-04-13 NOTE — Telephone Encounter (Signed)
Patient is a 64 years old with a history of Right knee osteoarthritis. Patient was last seen by Alphonzo Lemmings PAC on 09/01/13 for OA of right knee and has no follow up's.    Called the patient and left a message for her to call back and discuss her symptom.    Will wait for her to call back.    [place]  [place] at [facility]

## 2014-04-13 NOTE — Telephone Encounter (Signed)
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

## 2014-04-13 NOTE — Telephone Encounter (Signed)
02/24/2014 Received a Release of Medical Records from Patient to   disclose PHI ( see scan )  to:    Name : Kerby Moors    Relationship : Self    What pt has authorized to be disclosed: Chart notes from 09/27/2013 visit.    Given to patient.    Effective from : 02/24/2014    Effective to: 02/24/2017

## 2014-04-14 NOTE — Telephone Encounter (Signed)
Called the patient and left a message for her to call back and discuss her symptom.     Will wait for her to call back.     [place]   [place] at [facility]

## 2014-04-15 NOTE — Telephone Encounter (Signed)
Patient calls back and she states "I feel I have another bakers cyst. In a different area, behind my knee. It is a size of a gold ball. It is just behind the knee. The area is hard, there is no redness, no warmth, no calf or pain in the back of the leg. This is affecting my ROM in the knee, affecting my walking, and it is affecting my ability to play pickle ball. The pain is located on the inside of the knee. I have had pain in front of the middle of the knee".     Advised her that she needs to see DR Pelto on Friday so he can assess and possible baker cyst drainage. She was agreeable with this.     [place]  [place] at [facility]

## 2014-04-15 NOTE — Telephone Encounter (Signed)
Appt made with Dr. [name] on 04/17/14.    Closing this TE.    Criss Rosales, RN  [place] at [facility]

## 2014-04-17 ENCOUNTER — Ambulatory Visit: Payer: BLUE CROSS/BLUE SHIELD | Attending: Family Medicine | Admitting: Family Medicine

## 2014-04-17 ENCOUNTER — Encounter (HOSPITAL_BASED_OUTPATIENT_CLINIC_OR_DEPARTMENT_OTHER): Payer: Self-pay | Admitting: Family Medicine

## 2014-04-17 ENCOUNTER — Encounter (HOSPITAL_BASED_OUTPATIENT_CLINIC_OR_DEPARTMENT_OTHER): Payer: BLUE CROSS/BLUE SHIELD | Admitting: Rehabilitative and Restorative Service Providers"

## 2014-04-17 VITALS — BP 121/50 | HR 66 | Ht 67.32 in | Wt 115.0 lb

## 2014-04-17 DIAGNOSIS — M25461 Effusion, right knee: Secondary | ICD-10-CM | POA: Insufficient documentation

## 2014-04-17 DIAGNOSIS — M25561 Pain in right knee: Secondary | ICD-10-CM | POA: Insufficient documentation

## 2014-04-17 NOTE — Patient Instructions (Signed)
For MRI scheduling:    Call [phone]  This is the main [place] radiology number and they will help get you scheduled for a convenient time and location    Once you have your MRI scheduled call the Sports Medicine Clinic ([phone]) back and schedule a follow-up appointment

## 2014-04-20 ENCOUNTER — Ambulatory Visit (HOSPITAL_BASED_OUTPATIENT_CLINIC_OR_DEPARTMENT_OTHER): Payer: BLUE CROSS/BLUE SHIELD

## 2014-04-20 ENCOUNTER — Other Ambulatory Visit: Payer: Self-pay | Admitting: Nurse Practitioner

## 2014-04-20 ENCOUNTER — Ambulatory Visit: Payer: BLUE CROSS/BLUE SHIELD | Attending: Nurse Practitioner | Admitting: Nurse Practitioner

## 2014-04-20 DIAGNOSIS — Z1231 Encounter for screening mammogram for malignant neoplasm of breast: Secondary | ICD-10-CM

## 2014-04-20 DIAGNOSIS — Z803 Family history of malignant neoplasm of breast: Secondary | ICD-10-CM

## 2014-04-20 DIAGNOSIS — Z853 Personal history of malignant neoplasm of breast: Secondary | ICD-10-CM

## 2014-04-22 ENCOUNTER — Telehealth (HOSPITAL_BASED_OUTPATIENT_CLINIC_OR_DEPARTMENT_OTHER): Payer: Self-pay | Admitting: Family Medicine

## 2014-04-22 NOTE — Telephone Encounter (Signed)
Paula Bowman was seen by Dr. [name] on 04/17/14 for right knee pain.    Routing to [employer], CMA to have MRI report faxed and images pushed to [facility] PACS.  Then reviewed by Dr. [name].    Criss Rosales, RN  [place] at [facility]

## 2014-04-22 NOTE — Telephone Encounter (Signed)
CONFIRMED PHONE NUMBER: [phone] or [phone]  CALLERS FIRST AND LAST NAME: Kerby Moors  FACILITY NAME: N/A TITLE: N/A  CALLERS RELATIONSHIP:Self  RETURN CALL: Detailed message on voicemail only     SUBJECT: Results Request   REASON FOR REQUEST: Patient would like call from Dr. Grace Isaac as soon as results of MRI are available    WHO ORDERED THE TEST: Dr. Grace Isaac  TYPE OF TEST? Imaging  WHERE WAS THE TEST DONE: [facility]  WHEN WAS THE TEST DONE: 04/22/14

## 2014-04-22 NOTE — Telephone Encounter (Addendum)
Paula Bowman is a patient of Ricci Barker, [place], who saw Dr. Grace Isaac on 04/17/14 for right knee pain.  She is currently scheduled for an MRI review follow-up and possible right knee cortisone injection with Dr. Grace Isaac on Monday, April 27, 2014.    Paula Bowman had her right knee MRI done today at [place].  I requested and received the imaging report (see scanned in document), and requested, put on the white board, and called [facility] to let them know we would appreciate having the images pushed to PACS as soon as they can.  At 6 pm this evening the images were not yet available.  I called [facility] and left a voice mail message with the patient information and my direct line, requesting that the imaging be made available as soon as possible.    I phoned Paula Bowman [phone] to let her know that, given her MRI was done today and outside the [facility] system, I was making every effort to have the report and imaging available to [place], and Dr. Grace Isaac for review.  I let her know Dr. Grace Isaac is out of clinic tomorrow (Thursday), and may be too busy to call her on Friday, but that Dr. Grace Isaac will go over the MRI with her and review the next steps to take for treatment at her 11:30 follow-up appointment currently scheduled with Dr. Grace Isaac on Monday, 04/27/14.      Paula Bowman became noticeably upset and stated that does not work for her, and that she must have a call from Dr. Grace Isaac by Friday at the latest.  I offered to schedule her follow-up appointment on Friday instead of Monday and she declined, stated that if she can schedule an appointment with him for Friday instead of Monday, then he can call her instead.  I let her know I would pass her message on to Dr. Grace Isaac and if he can, he will call her on Friday.  She became more upset and asked that anybody call her on Friday with her results.  I let her know I would do my best to have someone call her as soon as the imaging is received, but that it  still may not be until Monday.    I gave this information to Paula Mayers, RN, and he said he will look into it and see what can be done.    [place], [place]

## 2014-04-24 ENCOUNTER — Telehealth (HOSPITAL_BASED_OUTPATIENT_CLINIC_OR_DEPARTMENT_OTHER): Payer: Self-pay | Admitting: Family Medicine

## 2014-04-24 ENCOUNTER — Telehealth (HOSPITAL_BASED_OUTPATIENT_CLINIC_OR_DEPARTMENT_OTHER): Payer: Self-pay | Admitting: Rehabilitative and Restorative Service Providers"

## 2014-04-24 NOTE — Telephone Encounter (Addendum)
[name], RN, worked on receiving the imaging and it came in and is now available for viewing.    Hand delivered this message to [facility].    [place], [place]

## 2014-04-24 NOTE — Telephone Encounter (Signed)
Returned Paula Bowman's call regarding MRI results. Spoke with her partner and left a message for her to call back.    Partner expressed that [place] is very concerned that the MRI shows cancer in her knee.    Ricci Barker, ATC, PA-C

## 2014-04-24 NOTE — Telephone Encounter (Signed)
Spoke with [employer] Let her know that Dr. Grace Isaac would give her a call back to discuss the MRI results. She was agreeable with this plan.    Ricci Barker, ATC, PA-C

## 2014-04-24 NOTE — Telephone Encounter (Signed)
CONFIRMED PHONE NUMBER: [phone]    CALLERS FIRST AND LAST NAME: Kerby Moors    FACILITY NAME: n/a TITLE: n/a   CALLERS RELATIONSHIP:Self  RETURN CALL: General message OK     SUBJECT: General Message   REASON FOR REQUEST: No Call     MESSAGE: Patient upset that she did not receive a call back today regarding MRI. Patient wanted clinic to know she will no longer be seeking care here because of this. Thank you.

## 2014-04-25 NOTE — Telephone Encounter (Signed)
Will give this message to Bowman-3.    [name] at [facility]     _______________________________________________________________________________________________________________________________________________________________________        Paula Czech, Bowman at 04/25/2014 8:14 AM      Status: Signed        Expand All Collapse All   See the message below.    Closing the TE.     [place]  [place] at [facility]       _______________________________________________________________________________________________________________________________________________________________________      Delsa Grana, PA-C at 04/24/2014 2:52 PM      Status: Signed         Expand All Collapse All   Spoke with Paula Bowman. Let her know that Dr. Grace Bowman would give her a call back to discuss the MRI results. She was agreeable with this plan.    Paula Bowman, ATC, PA-C                  Oren Section, Thea Silversmith, PA-C at 04/24/2014 12:40 PM      Status: Signed         Expand All Collapse All   Returned Paula Bowman's call regarding MRI results. Spoke with her partner and left a message for her to call back.    Partner expressed that Paula Bowman is very concerned that the MRI shows cancer in her knee.    Paula Bowman, ATC, PA-C                           Paula Bowman, Paula at 04/24/2014 10:18 AM      Status: Addendum        Expand All Collapse All   Paula Bowman, worked on receiving the imaging and it came in and is now available for viewing.    Hand delivered this message to [name].    Paula Bowman, [place]            Revision History         Date/Time User Action     > 04/24/2014 10:36 AM Paula Bowman, Paula Addend      04/24/2014 10:18 AM Paula Bowman, Paula Sign                   Paula Bowman, Paula at 04/22/2014 6:08 PM      Status: Addendum        Expand All Collapse All   Paula Bowman is a patient of Paula Barker, PA-C, who saw Dr. Grace Bowman on 04/17/14 for right knee  pain. She is currently scheduled for an MRI review follow-up and possible right knee cortisone injection with Dr. Grace Bowman on Monday, April 27, 2014.    Paula Bowman had her right knee MRI done today at [place]. I requested and received the imaging report (see scanned in document), and requested, put on the white board, and called [facility] to let them know we would appreciate having the images pushed to PACS as soon as they can. At 6 pm this evening the images were not yet available. I called [facility] and left a voice mail message with the patient information and my direct line, requesting that the imaging be made available as soon as possible.    I phoned Paula Bowman [phone] to let her know that, given her MRI was done today and outside the [facility] system, I was making  every effort to have the report and imaging available to PA Paula Bowman, and Dr. Grace Bowman for review. I let her know Dr. Grace Bowman is out of clinic tomorrow (Thursday), and may be too busy to call her on Friday, but that Dr. Grace Bowman will go over the MRI with her and review the next steps to take for treatment at her 11:30 follow-up appointment currently scheduled with Dr. Grace Bowman on Monday, 04/27/14.     Paula Bowman became noticeably upset and stated that does not work for her, and that she must have a call from Dr. Grace Bowman by Friday at the latest. I offered to schedule her follow-up appointment on Friday instead of Monday and she declined, stated that if she can schedule an appointment with him for Friday instead of Monday, then he can call her instead. I let her know I would pass her message on to Dr. Grace Bowman and if he can, he will call her on Friday. She became more upset and asked that anybody call her on Friday with her results. I let her know I would do my best to have someone call her as soon as the imaging is received, but that it still may not be until Monday.    I gave this information to Paula Bowman, and he said he will look into it  and see what can be done.    Paula Bowman, [place]            Revision History         Date/Time User Action     > 04/22/2014 6:28 PM Paula Bowman, Paula Addend      04/22/2014 6:18 PM Paula Bowman, Paula Sign                   Paula Daniel Paula Man, Bowman at 04/22/2014 1:51 PM      Status: Signed        Expand All Collapse All   Paula Bowman was seen by Dr. Consuella Bowman on 04/17/14 for right knee pain.    Routing to Paula Bowman, CMA to have MRI report faxed and images pushed to [facility] PACS. Then reviewed by Dr. Consuella Bowman.    Paula Rosales, Bowman  [place] at [facility] [place] [facility] at 04/22/2014 1:04 PM      Status: Signed        Expand All Collapse All   CONFIRMED PHONE NUMBER: [phone] or [phone]  CALLERS FIRST AND LAST NAME: Paula Bowman  FACILITY NAME: N/A TITLE: N/A  CALLERS RELATIONSHIP:Self  RETURN CALL: Detailed message on voicemail only  SUBJECT: Results Request   REASON FOR REQUEST: Patient would like call from Dr. Grace Bowman as soon as results of MRI are available    WHO ORDERED THE TEST: Dr. Grace Bowman  TYPE OF TEST? Imaging  WHERE WAS THE TEST DONE: [facility]  WHEN WAS THE TEST DONE: 04/22/14

## 2014-04-25 NOTE — Telephone Encounter (Signed)
See the message below.    Closing the TE.     [place]  [place] at [facility]       _______________________________________________________________________________________________________________________________________________________________________        Delsa Grana, PA-C at 04/24/2014 2:52 PM      Status: Signed        Expand All Collapse All   Spoke with Paula Bowman. Let her know that Dr. Grace Isaac would give her a call back to discuss the MRI results. She was agreeable with this plan.    Ricci Barker, ATC, PA-C                 Oren Section, Thea Silversmith, PA-C at 04/24/2014 12:40 PM      Status: Signed        Expand All Collapse All   Returned Paula Bowman's call regarding MRI results. Spoke with her partner and left a message for her to call back.    Partner expressed that Paula Bowman is very concerned that the MRI shows cancer in her knee.    Ricci Barker, ATC, PA-C

## 2014-04-27 ENCOUNTER — Encounter (HOSPITAL_BASED_OUTPATIENT_CLINIC_OR_DEPARTMENT_OTHER): Payer: BLUE CROSS/BLUE SHIELD | Admitting: Family Medicine

## 2014-04-27 ENCOUNTER — Encounter (HOSPITAL_BASED_OUTPATIENT_CLINIC_OR_DEPARTMENT_OTHER): Payer: Self-pay | Admitting: Family Medicine

## 2014-04-27 NOTE — Telephone Encounter (Signed)
Per duplicate TE:     Pelto III, Alphia Moh, MD at 04/27/2014 8:24 AM      Status: Signed        Expand All Collapse All   I called and spoke with Khaliyah at about 21:00 on 04/25/13. I informed her of her MRI results specifically primarily degenerative changes of the knee with meniscals tears, likely degenerative and no evidence of cancer which was a specific fear of hers.    She expressed understanding and wished to discuss next steps. I suggested an intra-articular corticosteroid injection given these findings and also noted again that a hemarthrosis in the setting of degenerative changes in uncommon but possible.    Ms. Newcom then shift the conversation to her dissatisfaction with my care in general and more specifically the communication of her results. She stated she would take her records and seek care elsewhere.    I stated I was sorry she was disappointed and wished her well.    Toniann Ket, MD        Routing to Barry Ernie Hew, BSN, for her information.    Closing TE.    [place], [place]

## 2014-04-27 NOTE — Progress Notes (Signed)
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    - PT and taping recommended at that time  - Worsening of pain since that visit and particularly over the last few months, particularly at the back of the knee and concerned she may have a return of Baker's cyst which she has had in the past.  - Limiting her activity on knee so difficult to determine if any locking or instability in the knee.    Patient Active Problem List   Diagnosis   . PERS HX OF BREAST MALIGNANCY   . Anxiety state, unspecified   . Other chronic allergic conjunctivitis   . GERD (gastroesophageal reflux disease)   . Eczema   . Allergic rhinitis   . Onychomycosis   . Back pain   . 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 Neck pain   . Need for prophylactic vaccination with combined diphtheria-tetanus-pertussis (DTP) vaccine   . Radiculopathy of cervical region   . Heat exhaustion       Past Medical History   Diagnosis Date   . Malignant neoplasm of breast (female), unspecified site 2008     lumpectomy: Stage 0; Radiation; was ERP; She did not complete Tamoxifen due to side effects x1 yr.    . Esophageal reflux        Past Surgical History   Procedure Laterality Date   . Vaginal hysterectomy uterus 250 gm/<  1994     Ovaries in place   . Knee, r knee at age 28, removed floating bone     . Total abdominal hysterect w/wo rmvl tube ovary       for fibroids   . Colonoscopy stoma dx including collj spec spx  2006     [place] , FU 10 years   . Hernia repair     . Breast  lumpectomy     . Unlisted procedure femur/knee Right 1969       Family History   Problem Relation Age of Onset   . Cancer Sister      Breast cancer at 40. Was not tested for BRCA   . Colon Cancer No Hx Of        History     Social History   . Marital Status: Soil scientist     Spouse Name: N/A   . Number of Children: N/A   . Years of Education: N/A     Occupational History   . Not on file.     Social History Main Topics   . Smoking status: Former Smoker -- 2.00 packs/day for 15 years     Types: Cigarettes     Quit date: 01/07/1988   . Smokeless tobacco: Never Used   . Alcohol Use: 0.5 oz/week     1 Standard drinks or equivalent per week      Comment: 2 glasses of wine daily   . Drug Use: No   .  Sexual Activity:     Partners: Female     Other Topics Concern   . 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    O:   Filed Vitals:    04/17/14 1025   BP: 121/50   Pulse: 66   Height: 5' 7.32" (1.71 m)   Weight: 115 lb (52.164 kg)   SpO2: 100%       General: Awake, alert, and oriented, no apparent distress, pleasant, and cooperative   Psychologic: Mood is euthymic, affect is congruent   HEENT: Normocephalic atraumatic, moist membranes, anicteric sclera   Pulmonary: Nonlabored breathing   Cardiovascular: No clubbing, cyanosis, or edema   Skin: No rashes or open ulcers in the areas examined  Knee exam (focus on R knee):  -- Inspection:  Swelling noted,  -- Gait: antalgic favoring the R  -- ROM: ROM from 10 to 110 degrees with knee flexion and extension  -- Strength: 5/5 strength with knee flexion and extension  -- Special tests: Anterior drawer neg. Posterior drawer neg. Lachman's maneuver neg. McMurray test neg.  -- Palpation: Effusion is large. Diffuse TTP over lateral and posterior knee. There is +  lateral joint line tenderness.    A/P:  (M25.561) Right knee pain  (primary encounter diagnosis)  (M25.461) Effusion, right knee  - With moderate OA seen on xrays we discussed that her symptoms are most likely related to degenerative changes and baker's cyst seen with bedside U/S today also likely related to this.  Discussed options and recommended aspiration and corticosteroid injection as next steps.  She was in agreement.  As noted below hemarthrosis was noted on aspiration so corticosteroid was not injected and we elected to proceed with any MRI for further evaluation.  Plan: MRI KNEE WO CONTRAST RIGHT     Toniann Ket, MD    Aspiration/Injection of R knee:     Informed Consent:   The risks and benefits of the procedure were discussed. Potential benefits include decreased pain, reduced inflammation, and improved function. The risks include the small risk of bleeding, a reaction to the medicine, allergy, infection, skin atrophy or hypopigmentation. The patient was given an opportunity to ask questions. Both verbal and written consent was obtained before the procedure.     Procedure:     Final verification of correct patient identity, procedure, site/side and instrument was performed prior to start of procedure: YES     Knee Aspiration and Injection: The patient was in the supine position and a superolateral approach taken. The skin was prepped with chlorhexadine, and a small amount of 1% lidocaine infiltrated locally for skin anesthesia using a 25G needle. Using an 18G needle, 35cc of blood colored fluid was aspirated.  No injection was performed. A sterile bandage was applied. I performed the procedure. The patient tolerated the procedure well.     Toniann Ket, MD

## 2014-04-27 NOTE — Telephone Encounter (Signed)
Patient is a 64 years old with a history of Knee pain. She saw DR Pelto on 04/17/2014 and has no follow up's.    Will forward the E-care message to DR Pelto.    [place]  [place] at [facility]

## 2014-04-27 NOTE — Telephone Encounter (Signed)
E-care message (see below).    [place] at [facility]       _______________________________________________________________________________________________________________________________________________________________________      To: Darrol [facility]     From: Paula Bowman     Created: 04/27/2014 9:50 AM       *-*-*This message has not been handled.*-*-*     I'd like to know why results of knee mri weren't put into my medical history. I'd like to take to new DR.

## 2014-04-27 NOTE — Telephone Encounter (Signed)
I called and spoke with Paula Bowman at about 21:00 on 04/25/13.  I informed her of her MRI results specifically primarily degenerative changes of the knee with meniscals tears, likely degenerative and no evidence of cancer which was a specific fear of hers.    She expressed understanding and wished to discuss next steps.  I suggested an intra-articular corticosteroid injection given these findings and also noted again that a hemarthrosis in the setting of degenerative changes in uncommon but possible.    Ms. Pew then shift the conversation to her dissatisfaction with my care in general and more specifically the communication of her results.  She stated she would take her records and seek care elsewhere.    I stated I was sorry she was disappointed and wished her well.    Toniann Ket, MD

## 2014-04-29 NOTE — Telephone Encounter (Signed)
Paula Bowman     From: Pelto III, Alphia Moh, MD     Created: 04/28/2014 10:31 AM       Your MRI results are in your medical record. I am not sure if this an area of the record you can see. You can also get the results from the MRI location along with a CD with the actual images which will be the most helpful thing for your new doctor to see and work with.    [name], MD

## 2014-06-17 ENCOUNTER — Ambulatory Visit (INDEPENDENT_AMBULATORY_CARE_PROVIDER_SITE_OTHER): Payer: BLUE CROSS/BLUE SHIELD | Admitting: Physician Assistant

## 2014-06-17 ENCOUNTER — Encounter (INDEPENDENT_AMBULATORY_CARE_PROVIDER_SITE_OTHER): Payer: Self-pay | Admitting: Physician Assistant

## 2014-06-17 VITALS — BP 119/84 | HR 78 | Temp 99.3°F | Resp 12 | Wt 113.8 lb

## 2014-06-17 DIAGNOSIS — R1030 Lower abdominal pain, unspecified: Secondary | ICD-10-CM

## 2014-06-17 LAB — CBC, DIFF
% Basophils: 1 %
% Eosinophils: 11 %
% Immature Granulocytes: 0 %
% Lymphocytes: 20 %
% Monocytes: 7 %
% Neutrophils: 61 %
Absolute Eosinophil Count: 0.98 10*3/uL — ABNORMAL HIGH (ref 0.00–0.50)
Absolute Lymphocyte Count: 1.71 10*3/uL (ref 1.00–4.80)
Basophils: 0.05 10*3/uL (ref 0.00–0.20)
Hematocrit: 35 % — ABNORMAL LOW (ref 36–45)
Hemoglobin: 11.6 g/dL (ref 11.5–15.5)
Immature Granulocytes: 0.03 10*3/uL (ref 0.00–0.05)
MCH: 32.9 pg (ref 27.3–33.6)
MCHC: 33.3 g/dL (ref 32.2–36.5)
MCV: 99 fL — ABNORMAL HIGH (ref 81–98)
Monocytes: 0.64 10*3/uL (ref 0.00–0.80)
Neutrophils: 5.2 10*3/uL (ref 1.80–7.00)
Platelet Count: 388 10*3/uL (ref 150–400)
RBC: 3.53 10*6/uL — ABNORMAL LOW (ref 3.80–5.00)
RDW-CV: 13.4 % (ref 11.6–14.4)
WBC: 8.61 10*3/uL (ref 4.3–10.0)

## 2014-06-17 MED ORDER — ALUM & MAG HYDROXIDE-SIMETH 200-200-20 MG/5ML OR SUSP
30.0000 mL | Freq: Once | ORAL | Status: AC
Start: 2014-06-17 — End: 2014-06-17
  Administered 2014-06-17: 30 mL via ORAL

## 2014-06-17 NOTE — Progress Notes (Signed)
Chief Complaint   Patient presents with   . Bowel Problems     Pt c/o having constant loose stool. Most recently starting having a burning feeling in bowel after using the restroom.        SUBJECTIVE:  Paula Bowman is an 64 year old female who presents with lower abdominal and burning for the past several weeks. Worsened with loose BM. Has an extensive history of bowel issues: microscopic colitis in 2009 and diarrhea since Aug/Sept of last year. Underwent stool sampling and everything was normal. No blood, no parasites. Denies left sided pain. Pain is confined to lower abdomen and is equal bilaterally. Notes excess bowel sounds throughout the day. Reports a 4 lb weight loss. Is a healthy eater but has reduced appetite. Has started eating anything and everything because "she just doesn't care anymore." Feels depressed because of the situation. Recently underwent a total knee replacement of right knee. Thought narcotics may have something to do with abdominal pain and discomfort, so she has worked to try and wean herself off. Has not had narcotics for 48 hours. Is taking ibuprofen 400mg  every 5-6 hours, sometimes will only take 200mg . Denies epigastric pain. Does take Nexium every other day for symptoms of GERD. Stools have been "normal soft" to "normal watery" as she has experienced for the past several months. Denies change in color of bowel movements, denies bloody stools, nausea or vomiting. Was offered a GI appointment for May 8th but declined because she felt so terribly.        Outpatient Prescriptions Prior to Visit   Medication Sig Dispense Refill   . Diclofenac Sodium 1 % Transdermal Gel Apply 4 g topically 4 times a day. Apply to posterior neck/shoulder 100 g 0   . Esomeprazole Magnesium (NEXIUM) 40 MG Oral CAPSULE DELAYED RELEASE One every other day for acid reflux     . Fluticasone Propionate 50 MCG/ACT Nasal Suspension Spray 2 sprays into each nostril daily. For prevention/control of nasal/sinus  congestion 3 Inhaler 3   . Naproxen Sodium (ALEVE OR)      . Olopatadine HCl 0.1 % Ophthalmic Solution Place 1 drop in each EYE 2 times a day. as needed (the azelastine is not tolerated)       No facility-administered medications prior to visit.       Review of patient's allergies indicates:  Allergies   Allergen Reactions   . Amoxicillin Rash   . Levofloxacin Itching     Redness up arm from IV dose   . Penicillin G Rash   . Terbinafine Hcl      Loss of sense of taste       History   Substance Use Topics   . Smoking status: Former Smoker -- 2.00 packs/day for 15 years     Types: Cigarettes     Quit date: 01/07/1988   . Smokeless tobacco: Never Used   . Alcohol Use: 0.5 oz/week     1 Standard drinks or equivalent per week      Comment: 2 glasses of wine daily       Review of Systems   Constitutional: Positive for fatigue. Negative for fever.   Gastrointestinal: Positive for abdominal pain (lower abdomen) and diarrhea. Negative for nausea, vomiting and constipation.   Genitourinary: Negative for dysuria, urgency, frequency, hematuria and flank pain.   Neurological: Negative for dizziness and light-headedness.       OBJECTIVE:  BP 119/84 mmHg  Pulse 78  Temp(Src) 99.3 F (  37.4 C) (Temporal)  Resp 12  Wt 113 lb 12.8 oz (51.619 kg)  SpO2 100%  Physical Exam   Constitutional: She is oriented to person, place, and time. She appears well-developed and well-nourished. No distress.   Cardiovascular: Normal rate, regular rhythm and normal heart sounds.    No murmur heard.  Pulmonary/Chest: Effort normal and breath sounds normal. No respiratory distress. She has no wheezes.   Abdominal: Soft. Normal aorta. She exhibits no shifting dullness, no distension, no abdominal bruit and no ascites. Bowel sounds are increased. There is no hepatosplenomegaly. There is tenderness in the right lower quadrant, epigastric area, suprapubic area and left lower quadrant. There is no CVA tenderness.   Genitourinary:   Patient declined    Neurological: She is alert and oriented to person, place, and time.   Skin: Skin is warm and dry. She is not diaphoretic.   Psychiatric: She has a normal mood and affect. Her speech is normal and behavior is normal.   Nursing note and vitals reviewed.      ASSESSMENT/PLAN  (R10.30) Lower abdominal pain  (primary encounter diagnosis)  Plan: alum & mag hydroxide-simethicone (Maalox Plus)         200-200-20 mg/5 mL, X-RAY ABDOMEN ACUTE 3+ VW,         CBC, DIFF, COMPREHENSIVE METABOLIC PANEL,         REFERRAL TO GASTROENTEROLOGY, CT ABDOMEN AND         PELVIS W CONT, CANCELED: CT ABD & PELVIS W/O         CONTRAST    FINDINGS AND IMPRESSION:    ABDOMEN:  The intestinal gas pattern is normal. There is no indication of   pneumoperitoneum or other ectopic gas collection. There is no mass or   abnormal calcification. There is no indication of an acute process. There is   a mild left convex thoracolumbar scoliosis.    CHEST:  The lungs are clear. Heart size and mediastinum are normal. No pleural   effusion or pneumothorax. No pneumoperitoneum.  ATTENDING RADIOLOGIST AND PAGER NUMBER  [phone] Ruben Gottron MD    Plan: Patient given maalox in clinic today. Verbalizes mild improvement in symptoms. Reviewed results of Xrays with patient and spouse. There are no acute findings/processes.   After consulting with Dr. Annamaria Boots, an order for CT abdomen and pelvis was ordered, along with labs. Referral for GI also ordered. Patient admits calling first GI specialist from the internet. Needs referral to proceed with appointment.  Patient declined pelvic exam today - offered, as she noted still having both ovaries. Patient appears frustrated with timing/wait times of pending orders. Offered the ED if symptoms worsen. Patient verbalizes understanding and is agreeable with this plan.  Patient Instructions:  1. May take Mylanta as directed for gas and bloating.  2. Referral for CT scan of abdomen and pelvis placed today  3.  Follow up with Dr. Annamaria Boots to discuss lab results and for further evaluation  4. Referral for GI also ordered. Please make first available appointment.  5. Refrain from consuming high fiber foods until stomach pain improves.      Irene Pap, PA-C  [place]  [facility]

## 2014-06-17 NOTE — Progress Notes (Signed)
Vitals taken, medication and allergies verified. Patient roomed by: Yury Schaus, CMA

## 2014-06-17 NOTE — Patient Instructions (Addendum)
It was a pleasure to see you in clinic today. You were seen by Dr:  and your Medical Assistant name is: Joni Reining             If you are not yet signed up for eCare, please speak with a team member at the front desk who would happy to assist you with this process.      eCare  enrollment will allow you access to the below benefits   You can make appointments online   View test results / Lab Results   Obtain a copy of our After Visit Summary (a summary of your visit today)     Your Test Results:  If labs were ordered today the results are expected to be available via eCare in about 5 days. If you have an active eCare account, this is how we will notify you of your results.     If you do not have an eCare account then your test results will be mailed to you within about 14 days after your tests are completed. If your physician needs to change your care based on your results or is concerned, you will receive a phone call.     If you have any questions about your test results please schedule an appointment with your provider.    **If it has been more than 2 weeks and you have not received your test results please send our office a message via eCare.    Medication Refills: If you need a prescription refilled, please contact your pharmacy 1 week before your current supply will run out to request the refill.  Contacting your pharmacy is the fastest and safest way to obtain a medication refill.  The pharmacy will notify our office.  Please note, that a minimum of 48 to 72 hours is needed to refill a medication, but refills are usually processed and sent to your pharmacy in about 5 business days.  Please call your pharmacy early to allow enough time to refill before you anticipate running out.  For faster medication refills, you can also schedule an appointment with your provider.    We know you have a choice in where you receive your healthcare and we sincerely thank you for trusting [facility] with  your health.                    Excess Gas  Certain foods produce gas when digested. In some people, the amounts of gas produced by these foods are excessive. This may cause bloating, burping or increased flatus (passing gas through the rectum).    The following foods are more likely to cause this problem. Reduce or eliminate them from your diet.  Broccoli  Cauliflower  Brussels sprouts  Cabbage  Cooked dried beans  Carbonated beverages (sparkling water, soda, beer, champagne)  Other Causes Of Excess Gas Include:   1) EATING TOO FAST or TALKING WHILE YOU CHEW may cause you to swallow air. This increases the amount of gas in the stomach and may worsen your symptoms.  --> Chew each mouthful completely before swallowing. Take your time.  2) OVEREATING may increase the feeling of being bloated and cause more gas.  --> When you are full, stop eating.  3) CONSTIPATION can increase the amount of normal intestinal gas.  --> Avoid constipation by increasing the amount of fiber in your diet by including whole cereal grains, fresh vegetables (except those in the above list) and fresh fruits.  High-fiber foods absorb water and carry it out of the body. When increasing the amount of fiber in your diet, you also need to increase the amount of water that you drink. You should drink at least eight 8-ounce glasses of water (two quarts) per day.   [address] [employer] [place], [place] [address]. All rights reserved. This information is not intended as a substitute for professional medical care. Always follow your healthcare professional's instructions.        Abdominal Pain, Unknown Cause (Female)    The exact cause of your abdominal (stomach) pain is not certain. This does not mean that this is something to worry about, or the right tests were not done. Everyone likes to know the exact cause of the problem, but sometimes with abdominal pain, there is no clear-cut cause, and this could be a good thing.  The good news is that your symptoms can be treated, and you will feel better.  Your condition does not seem serious now; however, sometimes the signs of a serious problem may take more time to appear. For this reason,it is important for you to watch for any new symptoms, problems,or worsening of your condition.  Over the next few days, the abdominal pain may come and go, or be continuous. Other common symptoms can include nausea and vomiting. Sometimes it can be difficult to tell if you feel nauseous, you may just feel bad and not associate that feeling with nausea. Constipation, diarrhea, and a fever may go along with the pain.  The pain may continue even if treated correctly over the following days. Depending on how things go, sometimes the cause can become clear and may require further or different treatment. Additional evaluations, medications, or tests may be needed.  Home care  Your health care provider may prescribe medications for pain, symptoms, or an infection. Follow the health care provider's instructions for taking these medications.  General care   Rest until your next exam. No strenuous activities.   Try to find positions that ease discomfort. A small pillow placed on the abdomen may help relieve pain.   Something warm on your abdomen (such as a heating pad) may help, but be careful not to burn yourself.  Diet   Do not force yourself to eat, especially if having cramps, vomiting, or diarrhea.   Water is important so you do not get dehydrated. Soup may also be good. Sports drinks may also help, especially if they are not too acidic. Make sure you don't drink sugary drinks as this can make things worse. Take liquids in small amounts. Do not guzzle them.   Caffeine sometimes makes the pain and cramping worse.   Avoid dairy products if you have vomiting or diarrhea.   Don't eat large amounts at a time. Wait a few minutes between bites.   Eat a diet low in fiber (called a low-residue diet).  Foods allowed include refined breads, white rice, fruit and vegetable juices without pulp, tender meats. These foods will pass more easily through the intestine.   Avoid whole-grain foods, whole fruits and vegetables, meats, seeds and nuts, fried or fatty foods, dairy, alcohol and spicy foods until your symptoms go away.  Follow-up care  Follow up with your health care provider as instructed, or if your pain does not begin to improve in the next 24 hours.  When to seek medical advice  Call your health care provider right away if any of these occur:  Pain gets worse or moves to the right lower abdomen   New or worsening vomiting or diarrhea   Swelling of the abdomen   Unable to pass stool for more than three days   Fever of 100.62F (38C) or higher, or as directed by your healthcare provider.   Blood in vomit or bowel movements (dark red or black color)   Jaundice (yellow color of eyes and skin)   Weakness, dizziness   Chest, arm, back, neck or jaw pain   Unexpected vaginal bleeding or missed period  Call 911  Call emergency services if any of these occur:   Trouble breathing   Confusion   Fainting or loss of consciousness   Rapid heart rate   Seizure   2000-2015 [employer] [place], [place] [address]. All rights reserved. This information is not intended as a substitute for professional medical care. Always follow your healthcare professional's instructions.    Plan:  1. May take Mylanta as directed for gas and bloating.  2. Referral for CT scan of abdomen and pelvis placed today  3. Follow up with Dr. Annamaria Boots to discuss lab results and for further evaluation  4. Referral for GI also ordered. Please make first available appointment.  5. Refrain from consuming high fiber foods until stomach pain improves.

## 2014-06-18 LAB — COMPREHENSIVE METABOLIC PANEL
ALT (GPT): 14 U/L (ref 7–33)
AST (GOT): 18 U/L (ref 9–38)
Albumin: 4 g/dL (ref 3.5–5.2)
Alkaline Phosphatase (Total): 52 U/L (ref 31–132)
Anion Gap: 7 (ref 4–12)
Bilirubin (Total): 0.3 mg/dL (ref 0.2–1.3)
Calcium: 10.1 mg/dL (ref 8.9–10.2)
Carbon Dioxide, Total: 28 meq/L (ref 22–32)
Chloride: 106 meq/L (ref 98–108)
Creatinine: 0.74 mg/dL (ref 0.38–1.02)
GFR, Calc, African American: >60 mL/min (ref >59)
GFR, Calc, European American: >60 mL/min (ref >59)
Glucose: 116 mg/dL (ref 62–125)
Potassium: 4.9 meq/L (ref 3.6–5.2)
Protein (Total): 6.9 g/dL (ref 6.0–8.2)
Sodium: 141 meq/L (ref 135–145)
Urea Nitrogen: 27 mg/dL — ABNORMAL HIGH (ref 8–21)

## 2014-06-19 ENCOUNTER — Encounter (INDEPENDENT_AMBULATORY_CARE_PROVIDER_SITE_OTHER): Payer: Self-pay | Admitting: Physician Assistant

## 2014-06-22 ENCOUNTER — Ambulatory Visit (INDEPENDENT_AMBULATORY_CARE_PROVIDER_SITE_OTHER): Payer: BLUE CROSS/BLUE SHIELD | Admitting: Internal Medicine

## 2014-06-22 VITALS — BP 101/67 | HR 72 | Temp 99.3°F | Resp 15 | Wt 112.8 lb

## 2014-06-22 DIAGNOSIS — R1084 Generalized abdominal pain: Secondary | ICD-10-CM

## 2014-06-22 DIAGNOSIS — R197 Diarrhea, unspecified: Secondary | ICD-10-CM

## 2014-06-22 NOTE — Progress Notes (Signed)
Pt roomed, medications, and allergies verified and updated by August Luz, MA-C  August Luz, 06/22/2014 4:30 PM

## 2014-06-22 NOTE — Patient Instructions (Addendum)
It was a pleasure to see you in clinic today. Your Medical Assistant was: Larene Beach           Soluble fiber in the diet:  Oatmeal.     Probiotics      Pepto bismol    See gastroenterologist    Colonoscopy when ready.           Exam: CT Abdomen and Pelvis with IV Contrast - 06/19/2014.  FINDINGS:    LUNG BASES:  The lung bases are clear. There are no pleural or pericardial effusions.    ABDOMEN:  Liver: Normal  Biliary system: Normal  Gallbladder: There is evidence of cholelithiasis without cholecystitis.  Spleen: Normal  Pancreas: Normal  Adrenal Glands:Normal.  Kidneys and Ureters:Normal.    Visualized loops of bowel: GI tract in the abdomen appears normal without obstruction or abnormal areas of bowel dilatation identified. No bowel wall thickening is seen. There appears to be fluidlike material within the colon without formed stool   identified.  Abdominal aorta: Mild scattered calcific plaque within the infrarenal abdominal aorta is noted. No aneurysm or dissection is seen.  Inferior vena cava: Normal  Miscellaneous: There are no free or loculated fluid collections, abscesses, inflammatory masses or free air detected. The anterior abdominal wall appears intact without hernias or defects identified. There are no abnormal lymph nodes.    PELVIS:  Bladder: Normal  Distal ureters: Normal  Visualized loops of bowel: GI tract in the pelvis appears unremarkable.  Miscellaneous: There is no free air or free fluid. There are no abnormal lymph nodes.  Uterus/Adnexa: There appear to be features of prior TAH/BSO.  Musculoskeletal structures: Within normal limits.    IMPRESSION:    1. Findings of cholelithiasis without cholecystitis.  2. Otherwise unremarkable abdominal/pelvic CT without etiology for the patient's complaints of abdominal pain and diarrhea detected. Some liquid like stool scattered throughout the colon is noted.      You can schedule an appointment to see [place] by calling [phone] or via eCare.         Thank  you for choosing [facility].

## 2014-06-22 NOTE — Progress Notes (Signed)
Paula Bowman is a 64 year old here for abdominal pain and diarrhea.        Had Total right knee replacement 3 weeks ago.      She has been having lower abdominal pain for the past 9 months .    Stool is loose on and off for months since her heat stroke episode 9 months ago.    After the surgery she was on narcotics for a few days.      She has lost about 5-6 lbs.      She saw a gastroenterologist at [facility].     Went to ED on Friday.  There she had a CT scan and labs.      Mylanta helped with burning.   She took peptobismol which seemed to help for a couple of days.  This is actually the best day she has had in months.      ROS: 14 system ROS done by me in December 2015:  No changes except as above.        Patient Active Problem List   Diagnosis   . PERS HX OF BREAST MALIGNANCY   . Anxiety state, unspecified   . Other chronic allergic conjunctivitis   . GERD (gastroesophageal reflux disease)   . Eczema   . Allergic rhinitis   . Onychomycosis   . Back pain   . 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 Neck pain   . Need for prophylactic vaccination with combined diphtheria-tetanus-pertussis (DTP) vaccine   . Radiculopathy of cervical region   . Heat exhaustion      Past Medical History   Diagnosis Date   . Malignant neoplasm of breast (female), unspecified site 2008     lumpectomy: Stage 0; Radiation; was ERP; She did not complete Tamoxifen due to side effects x1 yr.    . Esophageal reflux       Past Surgical History   Procedure Laterality Date   . Vaginal hysterectomy uterus 250 gm/<  1994     Ovaries in place   . Knee, r knee at age 6, removed floating bone     . Total abdominal hysterect w/wo rmvl tube ovary       for fibroids   . Colonoscopy stoma dx including collj spec spx  2006     [place] , FU 10 years   . Hernia repair     . Breast lumpectomy     . Unlisted procedure femur/knee Right 1969              Exam  BP 101/67 mmHg  Pulse 72  Temp(Src) 99.3 F (37.4 C) (Temporal)  Resp 15   Wt 112 lb 12.8 oz (51.166 kg)  SpO2 100%   General appearance: Underweight. Fatigued   Estimated body mass index is 17.5 kg/(m^2) as calculated from the following:    Height as of 04/17/14: 5' 7.32" (1.71 m).    Weight as of this encounter: 112 lb 12.8 oz (51.166 kg).   HEAD:  Head atraumatic.  Eyes without conjunctival irritation.  CHEST:  Normal contour.  Auscultation shows normal vesicular breath sounds.  CARDIAC:  Regular rate and rhythm.  S1 and S2 normal.  No murmers, rubs or gallops.   Abdomen is soft, non-distended, mild tenderness diffusely,  No rebound or guarding. No organomegaly.      Exam: CT Abdomen and Pelvis with IV Contrast - 06/19/2014.  FINDINGS:    LUNG  BASES:  The lung bases are clear. There are no pleural or pericardial effusions.    ABDOMEN:  Liver: Normal  Biliary system: Normal  Gallbladder: There is evidence of cholelithiasis without cholecystitis.  Spleen: Normal  Pancreas: Normal  Adrenal Glands:Normal.  Kidneys and Ureters:Normal.    Visualized loops of bowel: GI tract in the abdomen appears normal without obstruction or abnormal areas of bowel dilatation identified. No bowel wall thickening is seen. There appears to be fluidlike material within the colon without formed stool   identified.  Abdominal aorta: Mild scattered calcific plaque within the infrarenal abdominal aorta is noted. No aneurysm or dissection is seen.  Inferior vena cava: Normal  Miscellaneous: There are no free or loculated fluid collections, abscesses, inflammatory masses or free air detected. The anterior abdominal wall appears intact without hernias or defects identified. There are no abnormal lymph nodes.    PELVIS:  Bladder: Normal  Distal ureters: Normal  Visualized loops of bowel: GI tract in the pelvis appears unremarkable.  Miscellaneous: There is no free air or free fluid. There are no abnormal lymph nodes.  Uterus/Adnexa: There appear to be features of prior TAH/BSO.  Musculoskeletal structures: Within  normal limits.    IMPRESSION:    1. Findings of cholelithiasis without cholecystitis.  2. Otherwise unremarkable abdominal/pelvic CT without etiology for the patient's complaints of abdominal pain and diarrhea detected. Some liquid like stool scattered throughout the colon is noted.           Assessment  Encounter Diagnoses   Name Primary?   . Generalized abdominal pain Yes   . Diarrhea       Plans      Soluble fiber in the diet:  Oatmeal.     Probiotics      Pepto bismol    See gastroenterologist    Colonoscopy when ready.

## 2014-08-10 ENCOUNTER — Telehealth: Payer: Self-pay | Admitting: Internal Medicine

## 2014-08-13 ENCOUNTER — Ambulatory Visit (INDEPENDENT_AMBULATORY_CARE_PROVIDER_SITE_OTHER): Payer: 59 | Admitting: Internal Medicine

## 2014-08-13 ENCOUNTER — Encounter: Payer: Self-pay | Admitting: Internal Medicine

## 2014-08-13 VITALS — BP 130/90 | HR 76 | Ht 70.0 in | Wt 174.0 lb

## 2014-08-13 DIAGNOSIS — R631 Polydipsia: Secondary | ICD-10-CM

## 2014-08-13 DIAGNOSIS — E739 Lactose intolerance, unspecified: Secondary | ICD-10-CM

## 2014-08-13 DIAGNOSIS — Z0000 Encounter for general adult medical examination without abnormal findings: Secondary | ICD-10-CM | POA: Diagnosis not present

## 2014-08-13 DIAGNOSIS — E1165 Type 2 diabetes mellitus with hyperglycemia: Secondary | ICD-10-CM | POA: Diagnosis not present

## 2014-08-13 DIAGNOSIS — IMO0002 Reserved for concepts with insufficient information to code with codable children: Secondary | ICD-10-CM

## 2014-08-13 DIAGNOSIS — E1129 Type 2 diabetes mellitus with other diabetic kidney complication: Secondary | ICD-10-CM | POA: Insufficient documentation

## 2014-08-13 DIAGNOSIS — E119 Type 2 diabetes mellitus without complications: Secondary | ICD-10-CM | POA: Insufficient documentation

## 2014-08-13 LAB — GLUCOSE, POCT (MANUAL RESULT ENTRY): POC GLUCOSE: 546 mg/dL — AB (ref 70–99)

## 2014-08-13 MED ORDER — ONETOUCH DELICA LANCETS FINE MISC: 1.0000 | Freq: Every day | Status: DC | PRN

## 2014-08-13 MED ORDER — GLUCOSE BLOOD VI STRP: ORAL_STRIP | Status: DC

## 2014-08-13 MED ORDER — ALPRAZOLAM 1 MG PO TABS: 0.5000 mg | ORAL_TABLET | Freq: Two times a day (BID) | ORAL | Status: DC | PRN

## 2014-08-13 MED ORDER — VITAMIN D 1000 UNITS PO TABS: 1000.0000 [IU] | ORAL_TABLET | Freq: Every day | ORAL | Status: AC

## 2014-08-13 MED ORDER — ESCITALOPRAM OXALATE 10 MG PO TABS: 10.0000 mg | ORAL_TABLET | Freq: Every day | ORAL | Status: DC

## 2014-08-13 MED ORDER — CANAGLIFLOZIN-METFORMIN HCL 150-500 MG PO TABS: ORAL_TABLET | ORAL | Status: DC

## 2014-08-13 NOTE — Progress Notes (Signed)
Subjective:   HPI   The patient is here for a wellness exam.   C/o dry mouth, polyuria x 2 mo  The patient had a GB removed. F/u stress - husband was dx'd /throat ca in 4/14, doing well now  The patient needs to address her chronic hypertension that has been well controlled with medicines; to address chronic hair loss - worse. F/u elevated glucose.  Wt Readings from Last 3 Encounters:  08/13/14 174 lb (78.926 kg)  01/13/14 191 lb (86.637 kg)  12/24/13 187 lb 4 oz (84.936 kg)   BP Readings from Last 3 Encounters:  08/13/14 130/90  01/13/14 140/84  12/24/13 110/86      Review of Systems  Constitutional: Negative.  Negative for fever, chills, diaphoresis, activity change, appetite change, fatigue and unexpected weight change.  HENT: Negative for congestion, ear pain, facial swelling, hearing loss, mouth sores, nosebleeds, postnasal drip, rhinorrhea, sinus pressure, sneezing, sore throat, tinnitus and trouble swallowing.   Eyes: Negative for pain, discharge, redness, itching and visual disturbance.  Respiratory: Negative for cough, chest tightness, shortness of breath, wheezing and stridor.   Cardiovascular: Negative for chest pain, palpitations and leg swelling.  Gastrointestinal: Negative for nausea, diarrhea, constipation, blood in stool, abdominal distention, anal bleeding and rectal pain.  Genitourinary: Positive for frequency. Negative for dysuria, urgency, hematuria, flank pain, vaginal bleeding, vaginal discharge, difficulty urinating, genital sores and pelvic pain.  Musculoskeletal: Negative for back pain, joint swelling, arthralgias, gait problem, neck pain and neck stiffness.  Skin: Negative.  Negative for rash.  Neurological: Negative for dizziness, tremors, seizures, syncope, speech difficulty, weakness, numbness and headaches.  Hematological: Negative for adenopathy. Does not bruise/bleed easily.  Psychiatric/Behavioral: Negative for suicidal ideas, behavioral  problems, sleep disturbance, dysphoric mood and decreased concentration. The patient is not nervous/anxious.        Objective:   Physical Exam  Constitutional: She appears well-developed and well-nourished. No distress.  HENT:  Head: Normocephalic.  Right Ear: External ear normal.  Left Ear: External ear normal.  Nose: Nose normal.  Mouth/Throat: Oropharynx is clear and moist.  Eyes: Conjunctivae are normal. Pupils are equal, round, and reactive to light. Right eye exhibits no discharge. Left eye exhibits no discharge.  Neck: Normal range of motion. Neck supple. No JVD present. No tracheal deviation present. No thyromegaly present.  Cardiovascular: Normal rate, regular rhythm and normal heart sounds.   Pulmonary/Chest: No stridor. No respiratory distress. She has no wheezes.  Abdominal: Soft. Bowel sounds are normal. She exhibits no distension and no mass. There is no tenderness. There is no rebound and no guarding.  Musculoskeletal: She exhibits no edema or tenderness.  Lymphadenopathy:    She has no cervical adenopathy.  Neurological: She displays normal reflexes. No cranial nerve deficit. She exhibits normal muscle tone. Coordination normal.  Skin: No rash noted. No erythema.  Psychiatric: She has a normal mood and affect. Her behavior is normal. Judgment and thought content normal.        Lab Results  Component Value Date   WBC 6.4 12/03/2012   HGB 13.4 12/03/2012   HCT 39.0 12/03/2012   PLT 265.0 12/03/2012   CHOL 289* 10/03/2012   TRIG 197.0* 10/03/2012   HDL 41.60 10/03/2012   LDLDIRECT 207.3 10/03/2012   ALT 53* 12/03/2012   AST 49* 12/03/2012   NA 135 10/03/2012   K 4.2 10/03/2012   CL 101 10/03/2012   CREATININE 0.6 10/03/2012   BUN 14 10/03/2012   CO2 27 10/03/2012  TSH 2.82 10/03/2012   HGBA1C 7.5* 10/10/2012     Assessment & Plan:   .

## 2014-08-13 NOTE — Patient Instructions (Signed)

## 2014-08-13 NOTE — Assessment & Plan Note (Addendum)
Uncontrolled. Not sick. Start Invokamet - samples given. Pt took 1 tab in the office. Labs. Discussed Verio glucometer

## 2014-08-13 NOTE — Assessment & Plan Note (Signed)
We discussed age appropriate health related issues, including available/recomended screening tests and vaccinations. We discussed a need for adhering to healthy diet and exercise. Labs/EKG were reviewed/ordered. All questions were answered. Ophth 2016, PAP q 12 mo

## 2014-08-13 NOTE — Assessment & Plan Note (Signed)
Off welchol - worse

## 2014-08-13 NOTE — Assessment & Plan Note (Signed)
CBG 546 post-prandial

## 2014-08-13 NOTE — Progress Notes (Signed)
Pre visit review using our clinic review tool, if applicable. No additional management support is needed unless otherwise documented below in the visit note. 

## 2014-08-14 ENCOUNTER — Other Ambulatory Visit (INDEPENDENT_AMBULATORY_CARE_PROVIDER_SITE_OTHER): Payer: 59

## 2014-08-14 DIAGNOSIS — Z0000 Encounter for general adult medical examination without abnormal findings: Secondary | ICD-10-CM | POA: Diagnosis not present

## 2014-08-14 DIAGNOSIS — R7989 Other specified abnormal findings of blood chemistry: Secondary | ICD-10-CM

## 2014-08-14 DIAGNOSIS — E739 Lactose intolerance, unspecified: Secondary | ICD-10-CM | POA: Diagnosis not present

## 2014-08-14 DIAGNOSIS — R631 Polydipsia: Secondary | ICD-10-CM

## 2014-08-14 DIAGNOSIS — E785 Hyperlipidemia, unspecified: Secondary | ICD-10-CM

## 2014-08-14 DIAGNOSIS — E1165 Type 2 diabetes mellitus with hyperglycemia: Secondary | ICD-10-CM | POA: Diagnosis not present

## 2014-08-14 DIAGNOSIS — IMO0002 Reserved for concepts with insufficient information to code with codable children: Secondary | ICD-10-CM

## 2014-08-14 LAB — BASIC METABOLIC PANEL
BUN: 13 mg/dL (ref 6–23)
CHLORIDE: 98 meq/L (ref 96–112)
CO2: 27 mEq/L (ref 19–32)
Calcium: 9.4 mg/dL (ref 8.4–10.5)
Creatinine, Ser: 0.75 mg/dL (ref 0.40–1.20)
GFR: 82.6 mL/min (ref >60.00)
Glucose, Bld: 297 mg/dL — ABNORMAL HIGH (ref 70–99)
POTASSIUM: 4.9 meq/L (ref 3.5–5.1)
SODIUM: 136 meq/L (ref 135–145)

## 2014-08-14 LAB — HEPATIC FUNCTION PANEL
ALT: 20 U/L (ref 0–35)
AST: 21 U/L (ref 0–37)
Albumin: 3.9 g/dL (ref 3.5–5.2)
Alkaline Phosphatase: 112 U/L (ref 39–117)
BILIRUBIN DIRECT: 0.1 mg/dL (ref 0.0–0.3)
Total Bilirubin: 0.7 mg/dL (ref 0.2–1.2)
Total Protein: 7.6 g/dL (ref 6.0–8.3)

## 2014-08-14 LAB — CBC WITH DIFFERENTIAL/PLATELET
BASOS PCT: 0.6 % (ref 0.0–3.0)
Basophils Absolute: 0 10*3/uL (ref 0.0–0.1)
EOS ABS: 0.1 10*3/uL (ref 0.0–0.7)
Eosinophils Relative: 1.7 % (ref 0.0–5.0)
HCT: 43.9 % (ref 36.0–46.0)
HEMOGLOBIN: 15.1 g/dL — AB (ref 12.0–15.0)
Lymphocytes Relative: 28.6 % (ref 12.0–46.0)
Lymphs Abs: 2 10*3/uL (ref 0.7–4.0)
MCHC: 34.4 g/dL (ref 30.0–36.0)
MCV: 83.9 fl (ref 78.0–100.0)
Monocytes Absolute: 0.3 10*3/uL (ref 0.1–1.0)
Monocytes Relative: 4 % (ref 3.0–12.0)
NEUTROS ABS: 4.6 10*3/uL (ref 1.4–7.7)
Neutrophils Relative %: 65.1 % (ref 43.0–77.0)
Platelets: 271 10*3/uL (ref 150.0–400.0)
RBC: 5.23 Mil/uL — ABNORMAL HIGH (ref 3.87–5.11)
RDW: 12.9 % (ref 11.5–15.5)
WBC: 7 10*3/uL (ref 4.0–10.5)

## 2014-08-14 LAB — URINALYSIS
BILIRUBIN URINE: NEGATIVE
Ketones, ur: >=80 — AB
Leukocytes, UA: NEGATIVE
Nitrite: NEGATIVE
Specific Gravity, Urine: 1.02 (ref 1.000–1.030)
Total Protein, Urine: NEGATIVE
UROBILINOGEN UA: 0.2 (ref 0.0–1.0)
pH: 5.5 (ref 5.0–8.0)

## 2014-08-14 LAB — HEMOGLOBIN A1C: HEMOGLOBIN A1C: 15.1 % — AB (ref 4.6–6.5)

## 2014-08-14 LAB — LIPID PANEL
CHOL/HDL RATIO: 8
Cholesterol: 354 mg/dL — ABNORMAL HIGH (ref 0–200)
HDL: 43.9 mg/dL (ref >39.00)
NONHDL: 310.1
TRIGLYCERIDES: 357 mg/dL — AB (ref 0.0–149.0)
VLDL: 71.4 mg/dL — ABNORMAL HIGH (ref 0.0–40.0)

## 2014-08-14 LAB — TSH: TSH: 1.51 u[IU]/mL (ref 0.35–4.50)

## 2014-08-14 LAB — LDL CHOLESTEROL, DIRECT: Direct LDL: 219 mg/dL

## 2014-08-17 ENCOUNTER — Ambulatory Visit (INDEPENDENT_AMBULATORY_CARE_PROVIDER_SITE_OTHER): Payer: 59 | Admitting: Internal Medicine

## 2014-08-17 ENCOUNTER — Encounter (HOSPITAL_COMMUNITY): Payer: Self-pay | Admitting: Emergency Medicine

## 2014-08-17 ENCOUNTER — Encounter: Payer: Self-pay | Admitting: Internal Medicine

## 2014-08-17 ENCOUNTER — Inpatient Hospital Stay (HOSPITAL_COMMUNITY)
Admission: EM | Admit: 2014-08-17 | Discharge: 2014-08-22 | DRG: 638 | Disposition: A | Discharge status: Home / Self Care | Payer: 59 | Attending: Internal Medicine | Admitting: Internal Medicine

## 2014-08-17 ENCOUNTER — Emergency Department (HOSPITAL_COMMUNITY): Payer: 59

## 2014-08-17 VITALS — BP 120/82 | HR 117 | Temp 97.6°F

## 2014-08-17 DIAGNOSIS — Z79899 Other long term (current) drug therapy: Secondary | ICD-10-CM | POA: Diagnosis not present

## 2014-08-17 DIAGNOSIS — R112 Nausea with vomiting, unspecified: Secondary | ICD-10-CM | POA: Insufficient documentation

## 2014-08-17 DIAGNOSIS — F329 Major depressive disorder, single episode, unspecified: Secondary | ICD-10-CM | POA: Diagnosis present

## 2014-08-17 DIAGNOSIS — E1310 Other specified diabetes mellitus with ketoacidosis without coma: Secondary | ICD-10-CM | POA: Diagnosis present

## 2014-08-17 DIAGNOSIS — Z791 Long term (current) use of non-steroidal anti-inflammatories (NSAID): Secondary | ICD-10-CM

## 2014-08-17 DIAGNOSIS — E785 Hyperlipidemia, unspecified: Secondary | ICD-10-CM | POA: Diagnosis present

## 2014-08-17 DIAGNOSIS — F419 Anxiety disorder, unspecified: Secondary | ICD-10-CM | POA: Diagnosis present

## 2014-08-17 DIAGNOSIS — K219 Gastro-esophageal reflux disease without esophagitis: Secondary | ICD-10-CM | POA: Diagnosis present

## 2014-08-17 DIAGNOSIS — N179 Acute kidney failure, unspecified: Secondary | ICD-10-CM

## 2014-08-17 DIAGNOSIS — Z6824 Body mass index (BMI) 24.0-24.9, adult: Secondary | ICD-10-CM

## 2014-08-17 DIAGNOSIS — Z833 Family history of diabetes mellitus: Secondary | ICD-10-CM

## 2014-08-17 DIAGNOSIS — K222 Esophageal obstruction: Secondary | ICD-10-CM | POA: Diagnosis present

## 2014-08-17 DIAGNOSIS — M5489 Other dorsalgia: Secondary | ICD-10-CM

## 2014-08-17 DIAGNOSIS — Z881 Allergy status to other antibiotic agents status: Secondary | ICD-10-CM

## 2014-08-17 DIAGNOSIS — E860 Dehydration: Secondary | ICD-10-CM

## 2014-08-17 DIAGNOSIS — Z888 Allergy status to other drugs, medicaments and biological substances status: Secondary | ICD-10-CM | POA: Diagnosis not present

## 2014-08-17 DIAGNOSIS — E876 Hypokalemia: Secondary | ICD-10-CM | POA: Diagnosis present

## 2014-08-17 DIAGNOSIS — E1165 Type 2 diabetes mellitus with hyperglycemia: Secondary | ICD-10-CM | POA: Diagnosis not present

## 2014-08-17 DIAGNOSIS — E440 Moderate protein-calorie malnutrition: Secondary | ICD-10-CM | POA: Insufficient documentation

## 2014-08-17 DIAGNOSIS — E119 Type 2 diabetes mellitus without complications: Secondary | ICD-10-CM | POA: Diagnosis present

## 2014-08-17 DIAGNOSIS — I10 Essential (primary) hypertension: Secondary | ICD-10-CM | POA: Diagnosis present

## 2014-08-17 DIAGNOSIS — IMO0002 Reserved for concepts with insufficient information to code with codable children: Secondary | ICD-10-CM

## 2014-08-17 DIAGNOSIS — K589 Irritable bowel syndrome without diarrhea: Secondary | ICD-10-CM | POA: Diagnosis present

## 2014-08-17 DIAGNOSIS — Z880 Allergy status to penicillin: Secondary | ICD-10-CM

## 2014-08-17 DIAGNOSIS — E1129 Type 2 diabetes mellitus with other diabetic kidney complication: Secondary | ICD-10-CM | POA: Diagnosis present

## 2014-08-17 DIAGNOSIS — E1110 Type 2 diabetes mellitus with ketoacidosis without coma: Secondary | ICD-10-CM

## 2014-08-17 LAB — CBC WITH DIFFERENTIAL/PLATELET
BASOS ABS: 0 10*3/uL (ref 0.0–0.1)
BASOS PCT: 0 % (ref 0–1)
Eosinophils Absolute: 0 10*3/uL (ref 0.0–0.7)
Eosinophils Relative: 0 % (ref 0–5)
HEMATOCRIT: 46.1 % — AB (ref 36.0–46.0)
Hemoglobin: 15.3 g/dL — ABNORMAL HIGH (ref 12.0–15.0)
Lymphocytes Relative: 13 % (ref 12–46)
Lymphs Abs: 2.6 10*3/uL (ref 0.7–4.0)
MCH: 29.2 pg (ref 26.0–34.0)
MCHC: 33.2 g/dL (ref 30.0–36.0)
MCV: 88 fL (ref 78.0–100.0)
MONOS PCT: 6 % (ref 3–12)
Monocytes Absolute: 1.2 10*3/uL — ABNORMAL HIGH (ref 0.1–1.0)
Neutro Abs: 16.2 10*3/uL — ABNORMAL HIGH (ref 1.7–7.7)
Neutrophils Relative %: 81 % — ABNORMAL HIGH (ref 43–77)
Platelets: 324 10*3/uL (ref 150–400)
RBC: 5.24 MIL/uL — ABNORMAL HIGH (ref 3.87–5.11)
RDW: 13.2 % (ref 11.5–15.5)
WBC: 20 10*3/uL — AB (ref 4.0–10.5)

## 2014-08-17 LAB — COMPREHENSIVE METABOLIC PANEL
ALT: 20 U/L (ref 14–54)
AST: 24 U/L (ref 15–41)
Albumin: 4.5 g/dL (ref 3.5–5.0)
Alkaline Phosphatase: 110 U/L (ref 38–126)
Anion gap: 28 — ABNORMAL HIGH (ref 5–15)
BILIRUBIN TOTAL: 1.5 mg/dL — AB (ref 0.3–1.2)
BUN: 26 mg/dL — ABNORMAL HIGH (ref 6–20)
CHLORIDE: 103 mmol/L (ref 101–111)
CO2: 6 mmol/L — AB (ref 22–32)
Calcium: 9.3 mg/dL (ref 8.9–10.3)
Creatinine, Ser: 1.24 mg/dL — ABNORMAL HIGH (ref 0.44–1.00)
GFR calc Af Amer: 52 mL/min — ABNORMAL LOW (ref >60)
GFR, EST NON AFRICAN AMERICAN: 45 mL/min — AB (ref >60)
Glucose, Bld: 276 mg/dL — ABNORMAL HIGH (ref 65–99)
Potassium: 5.3 mmol/L — ABNORMAL HIGH (ref 3.5–5.1)
Sodium: 137 mmol/L (ref 135–145)
Total Protein: 8.7 g/dL — ABNORMAL HIGH (ref 6.5–8.1)

## 2014-08-17 LAB — CBG MONITORING, ED
GLUCOSE-CAPILLARY: 316 mg/dL — AB (ref 65–99)
Glucose-Capillary: 223 mg/dL — ABNORMAL HIGH (ref 65–99)

## 2014-08-17 LAB — URINALYSIS, ROUTINE W REFLEX MICROSCOPIC
Bilirubin Urine: NEGATIVE
Glucose, UA: >1000 mg/dL — AB
Ketones, ur: >80 mg/dL — AB
LEUKOCYTES UA: NEGATIVE
Nitrite: NEGATIVE
PH: 5 (ref 5.0–8.0)
Protein, ur: 30 mg/dL — AB
Specific Gravity, Urine: 1.017 (ref 1.005–1.030)
Urobilinogen, UA: 0.2 mg/dL (ref 0.0–1.0)

## 2014-08-17 LAB — GLUCOSE, POCT (MANUAL RESULT ENTRY): POC Glucose: 232 mg/dl — AB (ref 70–99)

## 2014-08-17 LAB — CBC
HEMATOCRIT: 46.1 % — AB (ref 36.0–46.0)
HEMOGLOBIN: 15.1 g/dL — AB (ref 12.0–15.0)
MCH: 29.2 pg (ref 26.0–34.0)
MCHC: 32.8 g/dL (ref 30.0–36.0)
MCV: 89.2 fL (ref 78.0–100.0)
Platelets: 279 10*3/uL (ref 150–400)
RBC: 5.17 MIL/uL — ABNORMAL HIGH (ref 3.87–5.11)
RDW: 13.2 % (ref 11.5–15.5)
WBC: 17.3 10*3/uL — AB (ref 4.0–10.5)

## 2014-08-17 LAB — GLUCOSE, CAPILLARY
Glucose-Capillary: 218 mg/dL — ABNORMAL HIGH (ref 65–99)
Glucose-Capillary: 174 mg/dL — ABNORMAL HIGH (ref 65–99)

## 2014-08-17 LAB — URINE MICROSCOPIC-ADD ON

## 2014-08-17 LAB — LIPASE, BLOOD: LIPASE: 16 U/L — AB (ref 22–51)

## 2014-08-17 LAB — MRSA PCR SCREENING: MRSA by PCR: NEGATIVE

## 2014-08-17 LAB — BETA-HYDROXYBUTYRIC ACID

## 2014-08-17 MED ORDER — ESCITALOPRAM OXALATE 10 MG PO TABS
10.0000 mg | ORAL_TABLET | Freq: Every day | ORAL | Status: DC
  Administered 2014-08-17 – 2014-08-22 (×6): 10 mg via ORAL
  Filled 2014-08-17 (×6): qty 1

## 2014-08-17 MED ORDER — ALPRAZOLAM 0.5 MG PO TABS
0.5000 mg | ORAL_TABLET | Freq: Two times a day (BID) | ORAL | Status: DC | PRN
  Administered 2014-08-19: 1 mg via ORAL
  Administered 2014-08-21: 0.5 mg via ORAL
  Filled 2014-08-17: qty 2
  Filled 2014-08-17: qty 1

## 2014-08-17 MED ORDER — SODIUM CHLORIDE 0.9 % IV BOLUS (SEPSIS)
1000.0000 mL | Freq: Once | INTRAVENOUS | Status: AC
  Administered 2014-08-17: 1000 mL via INTRAVENOUS

## 2014-08-17 MED ORDER — ONDANSETRON HCL 4 MG/2ML IJ SOLN
4.0000 mg | Freq: Once | INTRAMUSCULAR | Status: AC
  Administered 2014-08-17: 4 mg via INTRAMUSCULAR

## 2014-08-17 MED ORDER — ONDANSETRON HCL 4 MG/2ML IJ SOLN
4.0000 mg | Freq: Once | INTRAMUSCULAR | Status: AC
  Administered 2014-08-17: 4 mg via INTRAVENOUS
  Filled 2014-08-17: qty 2

## 2014-08-17 MED ORDER — HEPARIN SODIUM (PORCINE) 5000 UNIT/ML IJ SOLN
5000.0000 [IU] | Freq: Three times a day (TID) | INTRAMUSCULAR | Status: DC
  Administered 2014-08-17 – 2014-08-22 (×13): 5000 [IU] via SUBCUTANEOUS
  Filled 2014-08-17 (×15): qty 1

## 2014-08-17 MED ORDER — SODIUM CHLORIDE 0.9 % IV SOLN
INTRAVENOUS | Status: DC
  Administered 2014-08-18: 11:00:00 via INTRAVENOUS

## 2014-08-17 MED ORDER — DEXTROSE-NACL 5-0.45 % IV SOLN
INTRAVENOUS | Status: DC
  Administered 2014-08-17 – 2014-08-18 (×2): via INTRAVENOUS

## 2014-08-17 MED ORDER — PANTOPRAZOLE SODIUM 20 MG PO TBEC
20.0000 mg | DELAYED_RELEASE_TABLET | Freq: Every day | ORAL | Status: DC
  Administered 2014-08-17 – 2014-08-22 (×6): 20 mg via ORAL
  Filled 2014-08-17 (×6): qty 1

## 2014-08-17 MED ORDER — FENTANYL CITRATE (PF) 100 MCG/2ML IJ SOLN
100.0000 ug | INTRAMUSCULAR | Status: DC | PRN
Start: 2014-08-17 — End: 2014-08-17
  Administered 2014-08-17: 100 ug via INTRAVENOUS
  Filled 2014-08-17: qty 2

## 2014-08-17 MED ORDER — IOHEXOL 300 MG/ML  SOLN
100.0000 mL | Freq: Once | INTRAMUSCULAR | Status: AC | PRN
  Administered 2014-08-17: 100 mL via INTRAVENOUS

## 2014-08-17 MED ORDER — SODIUM CHLORIDE 0.9 % IV SOLN
INTRAVENOUS | Status: DC
  Administered 2014-08-17: 18:00:00 via INTRAVENOUS

## 2014-08-17 MED ORDER — POTASSIUM CHLORIDE 10 MEQ/100ML IV SOLN
10.0000 meq | INTRAVENOUS | Status: AC
  Administered 2014-08-17 – 2014-08-18 (×2): 10 meq via INTRAVENOUS
  Filled 2014-08-17 (×2): qty 100

## 2014-08-17 MED ORDER — SODIUM CHLORIDE 0.9 % IV SOLN
INTRAVENOUS | Status: AC
Start: 2014-08-17 — End: 2014-08-17
  Administered 2014-08-17: 22:00:00 via INTRAVENOUS

## 2014-08-17 MED ORDER — SODIUM CHLORIDE 0.9 % IV SOLN
INTRAVENOUS | Status: DC
  Administered 2014-08-17: 1.6 [IU]/h via INTRAVENOUS
  Filled 2014-08-17: qty 2.5

## 2014-08-17 MED ORDER — IOHEXOL 300 MG/ML  SOLN
50.0000 mL | Freq: Once | INTRAMUSCULAR | Status: AC | PRN
  Administered 2014-08-17: 50 mL via ORAL

## 2014-08-17 NOTE — Progress Notes (Signed)
Subjective:   Emesis  This is a new problem. The current episode started in the past 7 days (Sat night). The problem occurs more than 10 times per day. The problem has been gradually worsening. The emesis has an appearance of bile. There has been no fever. Associated symptoms include arthralgias and dizziness. Pertinent negatives include no abdominal pain, chest pain, chills, coughing, diarrhea, fever or headaches. She has tried nothing for the symptoms.   Lennyx started Invokamet on Thursday night. She was exposed to a 63 yo with a viral illness on Sat.  Hx: The patient had a GB removed. F/u stress - husband was dx'd /throat ca in 4/14, doing well now    Wt Readings from Last 3 Encounters:  08/13/14 174 lb (78.926 kg)  01/13/14 191 lb (86.637 kg)  12/24/13 187 lb 4 oz (84.936 kg)   BP Readings from Last 3 Encounters:  08/17/14 120/82  08/13/14 130/90  01/13/14 140/84      Review of Systems  Constitutional: Positive for fatigue. Negative for fever, chills, diaphoresis, activity change, appetite change and unexpected weight change.  HENT: Negative for congestion, ear pain, facial swelling, hearing loss, mouth sores, nosebleeds, postnasal drip, rhinorrhea, sinus pressure, sneezing, sore throat, tinnitus and trouble swallowing.   Eyes: Negative for pain, discharge, redness, itching and visual disturbance.  Respiratory: Negative for cough, chest tightness, shortness of breath, wheezing and stridor.   Cardiovascular: Negative for chest pain, palpitations and leg swelling.  Gastrointestinal: Positive for nausea and vomiting. Negative for abdominal pain, diarrhea, constipation, blood in stool, abdominal distention, anal bleeding and rectal pain.  Genitourinary: Negative for dysuria, urgency, frequency, hematuria, flank pain, vaginal bleeding, vaginal discharge, difficulty urinating, genital sores and pelvic pain.  Musculoskeletal: Positive for arthralgias. Negative for back pain, joint  swelling, gait problem, neck pain and neck stiffness.  Skin: Negative.  Negative for rash.  Neurological: Positive for dizziness and light-headedness. Negative for tremors, seizures, syncope, speech difficulty, weakness, numbness and headaches.  Hematological: Negative for adenopathy. Does not bruise/bleed easily.  Psychiatric/Behavioral: Negative for suicidal ideas, behavioral problems, sleep disturbance, dysphoric mood and decreased concentration. The patient is not nervous/anxious.        Objective:   Physical Exam  Constitutional: She appears well-developed and well-nourished. She appears distressed.  HENT:  Head: Normocephalic.  Right Ear: External ear normal.  Left Ear: External ear normal.  Nose: Nose normal.  Mouth/Throat: Oropharynx is clear and moist. No oropharyngeal exudate.  Eyes: Conjunctivae are normal. Pupils are equal, round, and reactive to light. Right eye exhibits no discharge. Left eye exhibits no discharge.  Neck: Normal range of motion. Neck supple. No JVD present. No tracheal deviation present. No thyromegaly present.  Cardiovascular: Normal rate, regular rhythm and normal heart sounds.   Pulmonary/Chest: No stridor. No respiratory distress. She has no wheezes.  Abdominal: Soft. Bowel sounds are normal. She exhibits no distension and no mass. There is no tenderness. There is no rebound and no guarding.  Musculoskeletal: She exhibits no edema or tenderness.  Lymphadenopathy:    She has no cervical adenopathy.  Neurological: She displays normal reflexes. No cranial nerve deficit. She exhibits normal muscle tone. Coordination normal.  Skin: No rash noted. No erythema.  Psychiatric: She has a normal mood and affect. Her behavior is normal. Judgment and thought content normal.  Dry mouth, tachycardic, moaning No meningeal signs uncomfortable     Lab Results  Component Value Date   WBC 7.0 08/14/2014   HGB 15.1* 08/14/2014  HCT 43.9 08/14/2014   PLT 271.0  08/14/2014   CHOL 354* 08/14/2014   TRIG 357.0* 08/14/2014   HDL 43.90 08/14/2014   LDLDIRECT 219.0 08/14/2014   ALT 20 08/14/2014   AST 21 08/14/2014   NA 136 08/14/2014   K 4.9 08/14/2014   CL 98 08/14/2014   CREATININE 0.75 08/14/2014   BUN 13 08/14/2014   CO2 27 08/14/2014   TSH 1.51 08/14/2014   HGBA1C 15.1* 08/14/2014     Assessment & Plan:   .

## 2014-08-17 NOTE — ED Provider Notes (Signed)
CSN: [id number]     Arrival date & time 08/17/14  1428 History   First MD Initiated Contact with Patient 08/17/14 1544     Chief Complaint  Patient presents with  . Nausea  . Emesis     (Consider location/radiation/quality/duration/timing/severity/associated sxs/prior Treatment) HPI   Catherine Mora is a 65 y.o. female who presents for evaluation of nausea and vomiting for 3 days. She has been unable to eat or drink anything. She does not have any diarrhea. She saw her PCP today who ordered a CBC, but not a metabolic panel. He sent her here for evaluation. She has not been able to tolerate her medications. She does not have known sick contacts with similar symptoms. She does not have any known abnormal food exposures. There are no other known modifying factors.   Past Medical History  Diagnosis Date  . Hyperlipidemia   . HTN (hypertension)   . Depression   . Glucose intolerance (impaired glucose tolerance)   . GERD (gastroesophageal reflux disease)   . Anxiety   . IBS (irritable bowel syndrome)   . Cholelithiasis 2011  . Allergy   . Cataract     starting, no surgery yet   Past Surgical History  Procedure Laterality Date  . Abdominal hysterectomy    . Oophorectomy    . Cholecystectomy N/A 07/03/2012    Procedure: LAPAROSCOPIC CHOLECYSTECTOMY WITH INTRAOPERATIVE CHOLANGIOGRAM;  Surgeon: Zenovia Jarred, MD;  Location: [facility];  Service: General;  Laterality: N/A;  . Cyst removed      x3   Family History  Problem Relation Age of Onset  . Heart disease Neg Hx   . Colon cancer Neg Hx   . Esophageal cancer Neg Hx   . Rectal cancer Neg Hx   . Stomach cancer Neg Hx   . Diabetes Other   . Diabetes Mother   . Diabetes Sister   . Diabetes Brother    History  Substance Use Topics  . Smoking status: Never Smoker   . Smokeless tobacco: Never Used  . Alcohol Use: Yes     Comment: very little   OB History    No data available     Review of Systems  All other systems  reviewed and are negative.     Allergies  Ciprofloxacin; Crestor; Hydrochlorothiazide; Paroxetine; Penicillins; and Simvastatin  Home Medications   Prior to Admission medications   Medication Sig Start Date End Date Taking? Authorizing Provider  ALPRAZolam Duanne Moron) 1 MG tablet Take 0.5-1 tablets (0.5-1 mg total) by mouth 2 (two) times daily as needed for sleep or anxiety. 08/13/14  Yes Aleksei Plotnikov V, MD  Canagliflozin-Metformin HCl (INVOKAMET) 150-500 MG TABS 1 po bid Patient taking differently: Take 1 tablet by mouth 2 (two) times daily.  08/13/14  Yes Aleksei Plotnikov V, MD  cholecalciferol (VITAMIN D) 1000 UNITS tablet Take 1 tablet (1,000 Units total) by mouth daily. 08/13/14 08/13/15 Yes Aleksei Plotnikov V, MD  escitalopram (LEXAPRO) 10 MG tablet Take 1 tablet (10 mg total) by mouth daily. 08/13/14  Yes Aleksei Plotnikov V, MD  fluconazole (DIFLUCAN) 200 MG tablet Take 200 mg by mouth every 30 (thirty) days.  08/11/14  Yes Historical Provider, MD  ibuprofen (ADVIL,MOTRIN) 200 MG tablet Take 400 mg by mouth every 6 (six) hours as needed for headache.   Yes Historical Provider, MD  ESTRACE VAGINAL 0.1 MG/GM vaginal cream Place 1 Applicatorful vaginally once a week.  08/11/14   Historical Provider, MD   BP 142/74  mmHg  Pulse 111  Temp(Src) 97.8 F (36.6 C) (Oral)  Resp 16  SpO2 100% Physical Exam  Constitutional: She is oriented to person, place, and time. She appears well-developed and well-nourished. She appears distressed (she is uncomfortable).  HENT:  Head: Normocephalic and atraumatic.  Right Ear: External ear normal.  Left Ear: External ear normal.  Eyes: Conjunctivae and EOM are normal. Pupils are equal, round, and reactive to light.  Neck: Normal range of motion and phonation normal. Neck supple.  Cardiovascular: Normal rate, regular rhythm and normal heart sounds.   Pulmonary/Chest: Effort normal and breath sounds normal. She exhibits no bony tenderness.  Abdominal:  Soft. There is no tenderness.  Musculoskeletal: Normal range of motion.  Neurological: She is alert and oriented to person, place, and time. No cranial nerve deficit or sensory deficit. She exhibits normal muscle tone. Coordination normal.  Skin: Skin is warm, dry and intact.  Psychiatric: She has a normal mood and affect. Her behavior is normal. Judgment and thought content normal.  Nursing note and vitals reviewed.   ED Course  Procedures (including critical care time) Medications  0.9 %  sodium chloride infusion ( Intravenous New Bag/Given 08/17/14 1825)  fentaNYL (SUBLIMAZE) injection 100 mcg (100 mcg Intravenous Given 08/17/14 2025)  sodium chloride 0.9 % bolus 1,000 mL (0 mLs Intravenous Stopped 08/17/14 1823)  sodium chloride 0.9 % bolus 1,000 mL (1,000 mLs Intravenous New Bag/Given 08/17/14 2025)  ondansetron (ZOFRAN) injection 4 mg (4 mg Intravenous Given 08/17/14 2025)    Patient Vitals for the past 24 hrs:  BP Temp Temp src Pulse Resp SpO2  08/17/14 2009 142/74 mmHg - - 111 - 100 %  08/17/14 1830 141/82 mmHg - - 109 16 99 %  08/17/14 1651 139/87 mmHg - - 113 - 100 %  08/17/14 1438 123/80 mmHg 97.8 F (36.6 C) Oral 112 19 98 %    8:05 PM Reevaluation with update and discussion. After initial assessment and treatment, an updated evaluation reveals she now has mid back pain. She states that her abdomen does not hurt. The pain is bilateral, and is rated at 4/10, but the patient. She has not had any more vomiting since being here. Additional treatments ordered. CT scan to assess for complicated factors. At this time the patient appears to be in DKA. Glucose is not significantly elevated. Therefore will not start insulin drip. Additional IV fluids were ordered. There are no overt signs of infection at this time. Urinalysis has not yet been obtained. Jelani Vreeland L   CRITICAL CARE Performed by: Daleen Bo L Total critical care time: 35 minutes Critical care time was exclusive of  separately billable procedures and treating other patients. Critical care was necessary to treat or prevent imminent or life-threatening deterioration. Critical care was time spent personally by me on the following activities: development of treatment plan with patient and/or surrogate as well as nursing, discussions with consultants, evaluation of patient's response to treatment, examination of patient, obtaining history from patient or surrogate, ordering and performing treatments and interventions, ordering and review of laboratory studies, ordering and review of radiographic studies, pulse oximetry and re-evaluation of patient's condition.  Labs Review Labs Reviewed  COMPREHENSIVE METABOLIC PANEL - Abnormal; Notable for the following:    Potassium 5.3 (*)    CO2 6 (*)    Glucose, Bld 276 (*)    BUN 26 (*)    Creatinine, Ser 1.24 (*)    Total Protein 8.7 (*)    Total Bilirubin 1.5 (*)  GFR calc non Af Amer 45 (*)    GFR calc Af Amer 52 (*)    Anion gap 28 (*)    All other components within normal limits  LIPASE, BLOOD - Abnormal; Notable for the following:    Lipase 16 (*)    All other components within normal limits  CBC WITH DIFFERENTIAL/PLATELET - Abnormal; Notable for the following:    WBC 20.0 (*)    RBC 5.24 (*)    Hemoglobin 15.3 (*)    HCT 46.1 (*)    Neutrophils Relative % 81 (*)    Neutro Abs 16.2 (*)    Monocytes Absolute 1.2 (*)    All other components within normal limits  CBG MONITORING, ED - Abnormal; Notable for the following:    Glucose-Capillary 316 (*)    All other components within normal limits  CBC WITH DIFFERENTIAL/PLATELET  URINALYSIS, ROUTINE W REFLEX MICROSCOPIC (NOT AT [facility])    Imaging Review No results found.   EKG Interpretation None      MDM   Final diagnoses:  Diabetic ketoacidosis without coma associated with type 2 diabetes mellitus  Other back pain    Vomiting likely secondary to DKA. No clear source for vomiting and back  pain, and both may be secondary to the DKA. Doubt serious bacterial infection or impending vascular collapse.  Nursing Notes Reviewed/ Care Coordinated Applicable Imaging Reviewed Interpretation of Laboratory Data incorporated into ED treatment  Plan: Admit    Daleen Bo, MD 08/20/14 0128

## 2014-08-17 NOTE — H&P (Signed)
[facility] History and Physical  Catherine Mora HFG:[id number] DOB: 08-Oct-1950 DOA: 08/17/2014  Referring physician: Christ Kick, MD PCP: Walker Kehr, MD   Chief Complaint: DKA  HPI: Catherine Mora is a 64 y.o. female with new diagnosis of Diabetes Mellitus presents with DKA. Patient was having vomiting going on since the weekend. She has no abdominal pain. She has been feeling feverish but has not had a documented fever. She has not been able to keep anything down. She has felt dizzy and has been extremely weak. Patient has no blood in the vomitus. She has no diarrhea noted. She has no blood in the stools noted. Patient was seen in the office today and was advised to come to the ED. In the ED she had labwork showing elevatd glucose with an AG of 28 and also with a CO2 of 6 on presentation. It appears she was diagnosed last week with DM and started on Invokamet.   Review of Systems:  12 point ROS performed and is unremarkable other than HPI  Past Medical History  Diagnosis Date  . Hyperlipidemia   . HTN (hypertension)   . Depression   . Glucose intolerance (impaired glucose tolerance)   . GERD (gastroesophageal reflux disease)   . Anxiety   . IBS (irritable bowel syndrome)   . Cholelithiasis 2011  . Allergy   . Cataract     starting, no surgery yet   Past Surgical History  Procedure Laterality Date  . Abdominal hysterectomy    . Oophorectomy    . Cholecystectomy N/A 07/03/2012    Procedure: LAPAROSCOPIC CHOLECYSTECTOMY WITH INTRAOPERATIVE CHOLANGIOGRAM;  Surgeon: Zenovia Jarred, MD;  Location: [place];  Service: General;  Laterality: N/A;  . Cyst removed      x3   Social History:  reports that she has never smoked. She has never used smokeless tobacco. She reports that she drinks alcohol. She reports that she does not use illicit drugs.  Allergies  Allergen Reactions  . Ciprofloxacin     REACTION: itching  . Crestor [Rosuvastatin Calcium]     arthralgia  .  Hydrochlorothiazide     REACTION: hair loss  . Paroxetine     REACTION: living in glass  . Penicillins Hives  . Simvastatin     REACTION: aches with statins    Family History  Problem Relation Age of Onset  . Heart disease Neg Hx   . Colon cancer Neg Hx   . Esophageal cancer Neg Hx   . Rectal cancer Neg Hx   . Stomach cancer Neg Hx   . Diabetes Other   . Diabetes Mother   . Diabetes Sister   . Diabetes Brother      Prior to Admission medications   Medication Sig Start Date End Date Taking? Authorizing Provider  ALPRAZolam Duanne Moron) 1 MG tablet Take 0.5-1 tablets (0.5-1 mg total) by mouth 2 (two) times daily as needed for sleep or anxiety. 08/13/14  Yes Aleksei Plotnikov V, MD  Canagliflozin-Metformin HCl (INVOKAMET) 150-500 MG TABS 1 po bid Patient taking differently: Take 1 tablet by mouth 2 (two) times daily.  08/13/14  Yes Aleksei Plotnikov V, MD  cholecalciferol (VITAMIN D) 1000 UNITS tablet Take 1 tablet (1,000 Units total) by mouth daily. 08/13/14 08/13/15 Yes Aleksei Plotnikov V, MD  escitalopram (LEXAPRO) 10 MG tablet Take 1 tablet (10 mg total) by mouth daily. 08/13/14  Yes Aleksei Plotnikov V, MD  fluconazole (DIFLUCAN) 200 MG tablet Take 200 mg by mouth every  30 (thirty) days.  08/11/14  Yes Historical Provider, MD  ibuprofen (ADVIL,MOTRIN) 200 MG tablet Take 400 mg by mouth every 6 (six) hours as needed for headache.   Yes Historical Provider, MD  ESTRACE VAGINAL 0.1 MG/GM vaginal cream Place 1 Applicatorful vaginally once a week.  08/11/14   Historical Provider, MD   Physical Exam: Filed Vitals:   08/17/14 1438 08/17/14 1651 08/17/14 1830 08/17/14 2009  BP: 123/80 139/87 141/82 142/74  Pulse: 112 113 109 111  Temp: 97.8 F (36.6 C)     TempSrc: Oral     Resp: 19  16   SpO2: 98% 100% 99% 100%    Wt Readings from Last 3 Encounters:  08/13/14 78.926 kg (174 lb)  01/13/14 86.637 kg (191 lb)  12/24/13 84.936 kg (187 lb 4 oz)    General:  Appears calm and  comfortable Eyes: PERRL, normal lids, irises & conjunctiva ENT: grossly normal hearing, lips & tongue Neck: no LAD, masses or thyromegaly Cardiovascular: RRR, no m/r/g. No LE edema. Respiratory: CTA bilaterally, no w/r/r. Normal respiratory effort. Abdomen: soft, ntnd Skin: no rash or induration seen on limited exam Musculoskeletal: grossly normal tone BUE/BLE Psychiatric: grossly normal mood and affect Neurologic: grossly non-focal.          Labs on Admission:  Basic Metabolic Panel:  Recent Labs Lab 08/14/14 1014 08/17/14 1518  NA 136 137  K 4.9 5.3*  CL 98 103  CO2 27 6*  GLUCOSE 297* 276*  BUN 13 26*  CREATININE 0.75 1.24*  CALCIUM 9.4 9.3   Liver Function Tests:  Recent Labs Lab 08/14/14 1014 08/17/14 1518  AST 21 24  ALT 20 20  ALKPHOS 112 110  BILITOT 0.7 1.5*  PROT 7.6 8.7*  ALBUMIN 3.9 4.5    Recent Labs Lab 08/17/14 1518  LIPASE 16*   No results for input(s): AMMONIA in the last 168 hours. CBC:  Recent Labs Lab 08/14/14 1014 08/17/14 1835  WBC 7.0 20.0*  NEUTROABS 4.6 16.2*  HGB 15.1* 15.3*  HCT 43.9 46.1*  MCV 83.9 88.0  PLT 271.0 324   Cardiac Enzymes: No results for input(s): CKTOTAL, CKMB, CKMBINDEX, TROPONINI in the last 168 hours.  BNP (last 3 results) No results for input(s): BNP in the last 8760 hours.  ProBNP (last 3 results) No results for input(s): PROBNP in the last 8760 hours.  CBG:  Recent Labs Lab 08/17/14 1513  GLUCAP 316*    Radiological Exams on Admission: No results found.    Assessment/Plan Principal Problem:   DKA (diabetic ketoacidoses) Active Problems:   GERD   HTN (hypertension)   Diabetes mellitus type 2, uncontrolled   AKI (acute kidney injury)   1. DKA -admit to step down unit -will start on Insulin drip -started on IVF NS at 125/hr -monitor Met B q4h -will check A1C -hold oral agent for now  2. GERD -will place on PPI as tolerated  3. HTN -currently not on any  medications -will monitor pressures  4. Hyperlipidemia -currently not on any medications -will check lipid panel  5. AKI -secondary to vomiting dehydration -will hydrate as above per DKA protocol -follow up Met B   Code Status: Full Code (must indicate code status--if unknown or must be presumed, indicate so) DVT Prophylaxis:Heparin Family Communication: None (indicate person spoken with, if applicable, with phone number if by telephone) Disposition Plan: home (indicate anticipated LOS)  Time spent: 52mn  Elazar Argabright A [facility] Pager [phone]

## 2014-08-17 NOTE — Assessment & Plan Note (Addendum)
6/16 ?viral illness vs other Zofran IM given

## 2014-08-17 NOTE — Progress Notes (Signed)
Utilization Review completed.  Jaycey Gens RN CM  

## 2014-08-17 NOTE — ED Notes (Signed)
Made two attempts to collect blood with two unsuccessful results

## 2014-08-17 NOTE — Assessment & Plan Note (Addendum)
6/16 ?viral illness vs other To ER. Pt needs IVF ASAP

## 2014-08-17 NOTE — Assessment & Plan Note (Addendum)
To [facility] ER (6/16 - uncontrolled. Started Invokamet on Thursday last week - samples)

## 2014-08-17 NOTE — ED Notes (Signed)
Pt sent from Dr Lauraine Rinne for n/v since Saturday. Pt states that she was recently diagnosed with diabetes.

## 2014-08-17 NOTE — ED Notes (Signed)
Patient tolerated po fluids without any problems.

## 2014-08-17 NOTE — ED Notes (Signed)
MD at bedside. 

## 2014-08-17 NOTE — ED Notes (Signed)
Dr Plotnickov sending pt over for n/v and dehydration.

## 2014-08-17 NOTE — ED Notes (Signed)
Unable to collect labs at this time.  Patient will get some fluids and I will return.

## 2014-08-17 NOTE — ED Notes (Signed)
Pt stated that urine was collected earlier.   Pt did not give urine.   Will check in 30 minutes.

## 2014-08-17 NOTE — ED Notes (Signed)
Admitting physician at the bedside.

## 2014-08-17 NOTE — Progress Notes (Signed)
Pre visit review using our clinic review tool, if applicable. No additional management support is needed unless otherwise documented below in the visit note. 

## 2014-08-18 DIAGNOSIS — E440 Moderate protein-calorie malnutrition: Secondary | ICD-10-CM | POA: Insufficient documentation

## 2014-08-18 LAB — BASIC METABOLIC PANEL
ANION GAP: 13 (ref 5–15)
ANION GAP: 20 — AB (ref 5–15)
Anion gap: 16 — ABNORMAL HIGH (ref 5–15)
Anion gap: 19 — ABNORMAL HIGH (ref 5–15)
Anion gap: 14 (ref 5–15)
Anion gap: 14 (ref 5–15)
BUN: 15 mg/dL (ref 6–20)
BUN: 16 mg/dL (ref 6–20)
BUN: 16 mg/dL (ref 6–20)
BUN: 20 mg/dL (ref 6–20)
BUN: 21 mg/dL — AB (ref 6–20)
BUN: 22 mg/dL — ABNORMAL HIGH (ref 6–20)
CALCIUM: 6.1 mg/dL — AB (ref 8.9–10.3)
CALCIUM: 7.8 mg/dL — AB (ref 8.9–10.3)
CHLORIDE: 108 mmol/L (ref 101–111)
CHLORIDE: 113 mmol/L — AB (ref 101–111)
CO2: 8 mmol/L — ABNORMAL LOW (ref 22–32)
CO2: 6 mmol/L — ABNORMAL LOW (ref 22–32)
CO2: 9 mmol/L — AB (ref 22–32)
CO2: 9 mmol/L — ABNORMAL LOW (ref 22–32)
CO2: 6 mmol/L — AB (ref 22–32)
CO2: 8 mmol/L — AB (ref 22–32)
CREATININE: 1.08 mg/dL — AB (ref 0.44–1.00)
CREATININE: 0.9 mg/dL (ref 0.44–1.00)
Calcium: 7.9 mg/dL — ABNORMAL LOW (ref 8.9–10.3)
Calcium: 8.1 mg/dL — ABNORMAL LOW (ref 8.9–10.3)
Calcium: 7.9 mg/dL — ABNORMAL LOW (ref 8.9–10.3)
Calcium: 7.7 mg/dL — ABNORMAL LOW (ref 8.9–10.3)
Chloride: 110 mmol/L (ref 101–111)
Chloride: 111 mmol/L (ref 101–111)
Chloride: 115 mmol/L — ABNORMAL HIGH (ref 101–111)
Chloride: 119 mmol/L — ABNORMAL HIGH (ref 101–111)
Creatinine, Ser: 0.7 mg/dL (ref 0.44–1.00)
Creatinine, Ser: 0.85 mg/dL (ref 0.44–1.00)
Creatinine, Ser: 1.05 mg/dL — ABNORMAL HIGH (ref 0.44–1.00)
Creatinine, Ser: 1.11 mg/dL — ABNORMAL HIGH (ref 0.44–1.00)
GFR calc Af Amer: 59 mL/min — ABNORMAL LOW (ref >60)
GFR calc Af Amer: >60 mL/min (ref >60)
GFR calc Af Amer: >60 mL/min (ref >60)
GFR calc non Af Amer: 51 mL/min — ABNORMAL LOW (ref >60)
GFR calc non Af Amer: 55 mL/min — ABNORMAL LOW (ref >60)
GFR calc non Af Amer: >60 mL/min (ref >60)
GFR, EST NON AFRICAN AMERICAN: 53 mL/min — AB (ref >60)
GLUCOSE: 218 mg/dL — AB (ref 65–99)
Glucose, Bld: 145 mg/dL — ABNORMAL HIGH (ref 65–99)
Glucose, Bld: 150 mg/dL — ABNORMAL HIGH (ref 65–99)
Glucose, Bld: 179 mg/dL — ABNORMAL HIGH (ref 65–99)
Glucose, Bld: 225 mg/dL — ABNORMAL HIGH (ref 65–99)
Glucose, Bld: 229 mg/dL — ABNORMAL HIGH (ref 65–99)
POTASSIUM: 4.3 mmol/L (ref 3.5–5.1)
POTASSIUM: 4.5 mmol/L (ref 3.5–5.1)
Potassium: 3.8 mmol/L (ref 3.5–5.1)
Potassium: 4.2 mmol/L (ref 3.5–5.1)
Potassium: 4.6 mmol/L (ref 3.5–5.1)
Potassium: 4.8 mmol/L (ref 3.5–5.1)
Sodium: 134 mmol/L — ABNORMAL LOW (ref 135–145)
Sodium: 135 mmol/L (ref 135–145)
Sodium: 136 mmol/L (ref 135–145)
Sodium: 137 mmol/L (ref 135–145)
Sodium: 138 mmol/L (ref 135–145)
Sodium: 138 mmol/L (ref 135–145)

## 2014-08-18 LAB — LACTIC ACID, PLASMA
LACTIC ACID, VENOUS: 0.6 mmol/L (ref 0.5–2.0)
Lactic Acid, Venous: 0.6 mmol/L (ref 0.5–2.0)

## 2014-08-18 LAB — CBC
HCT: 45.4 % (ref 36.0–46.0)
HEMOGLOBIN: 15 g/dL (ref 12.0–15.0)
MCH: 29 pg (ref 26.0–34.0)
MCHC: 33 g/dL (ref 30.0–36.0)
MCV: 87.6 fL (ref 78.0–100.0)
Platelets: 246 10*3/uL (ref 150–400)
RBC: 5.18 MIL/uL — ABNORMAL HIGH (ref 3.87–5.11)
RDW: 13.4 % (ref 11.5–15.5)
WBC: 14.6 10*3/uL — ABNORMAL HIGH (ref 4.0–10.5)

## 2014-08-18 LAB — GLUCOSE, CAPILLARY
GLUCOSE-CAPILLARY: 141 mg/dL — AB (ref 65–99)
GLUCOSE-CAPILLARY: 154 mg/dL — AB (ref 65–99)
GLUCOSE-CAPILLARY: 152 mg/dL — AB (ref 65–99)
GLUCOSE-CAPILLARY: 125 mg/dL — AB (ref 65–99)
GLUCOSE-CAPILLARY: 121 mg/dL — AB (ref 65–99)
GLUCOSE-CAPILLARY: 214 mg/dL — AB (ref 65–99)
GLUCOSE-CAPILLARY: 261 mg/dL — AB (ref 65–99)
GLUCOSE-CAPILLARY: 204 mg/dL — AB (ref 65–99)
Glucose-Capillary: 134 mg/dL — ABNORMAL HIGH (ref 65–99)
Glucose-Capillary: 135 mg/dL — ABNORMAL HIGH (ref 65–99)
Glucose-Capillary: 139 mg/dL — ABNORMAL HIGH (ref 65–99)
Glucose-Capillary: 151 mg/dL — ABNORMAL HIGH (ref 65–99)
Glucose-Capillary: 154 mg/dL — ABNORMAL HIGH (ref 65–99)
Glucose-Capillary: 165 mg/dL — ABNORMAL HIGH (ref 65–99)
Glucose-Capillary: 176 mg/dL — ABNORMAL HIGH (ref 65–99)
Glucose-Capillary: 179 mg/dL — ABNORMAL HIGH (ref 65–99)
Glucose-Capillary: 194 mg/dL — ABNORMAL HIGH (ref 65–99)
Glucose-Capillary: 227 mg/dL — ABNORMAL HIGH (ref 65–99)
Glucose-Capillary: 228 mg/dL — ABNORMAL HIGH (ref 65–99)
Glucose-Capillary: 334 mg/dL — ABNORMAL HIGH (ref 65–99)

## 2014-08-18 LAB — BLOOD GAS, ARTERIAL
ACID-BASE DEFICIT: 19.6 mmol/L — AB (ref 0.0–2.0)
BICARBONATE: 8.2 meq/L — AB (ref 20.0–24.0)
DRAWN BY: 331471
O2 SAT: 96.9 %
PATIENT TEMPERATURE: 98.6
TCO2: 7.8 mmol/L (ref 0–100)
pCO2 arterial: 24.6 mmHg — ABNORMAL LOW (ref 35.0–45.0)
pH, Arterial: 7.149 — CL (ref 7.350–7.450)
pO2, Arterial: 92.9 mmHg (ref 80.0–100.0)

## 2014-08-18 LAB — LIPID PANEL
CHOLESTEROL: 382 mg/dL — AB (ref 0–200)
HDL: 48 mg/dL (ref >40)
LDL Cholesterol: 269 mg/dL — ABNORMAL HIGH (ref 0–99)
Total CHOL/HDL Ratio: 8 RATIO
Triglycerides: 324 mg/dL — ABNORMAL HIGH (ref <150)
VLDL: 65 mg/dL — ABNORMAL HIGH (ref 0–40)

## 2014-08-18 LAB — PHOSPHORUS: Phosphorus: 2.9 mg/dL (ref 2.5–4.6)

## 2014-08-18 MED ORDER — INSULIN GLARGINE 100 UNIT/ML ~~LOC~~ SOLN
6.0000 [IU] | Freq: Every day | SUBCUTANEOUS | Status: DC
  Administered 2014-08-18: 6 [IU] via SUBCUTANEOUS
  Filled 2014-08-18: qty 0.06

## 2014-08-18 MED ORDER — ONDANSETRON HCL 4 MG/2ML IJ SOLN
4.0000 mg | Freq: Four times a day (QID) | INTRAMUSCULAR | Status: DC | PRN
  Administered 2014-08-18 – 2014-08-19 (×5): 4 mg via INTRAVENOUS
  Filled 2014-08-18 (×5): qty 2

## 2014-08-18 MED ORDER — SODIUM CHLORIDE 0.9 % IV BOLUS (SEPSIS)
500.0000 mL | INTRAVENOUS | Status: AC
  Administered 2014-08-18 (×2): 500 mL via INTRAVENOUS

## 2014-08-18 MED ORDER — VITAMINS A & D EX OINT
TOPICAL_OINTMENT | CUTANEOUS | Status: AC
  Administered 2014-08-18: 1
  Filled 2014-08-18: qty 5

## 2014-08-18 MED ORDER — DEXTROSE 50 % IV SOLN: 25.0000 mL | INTRAVENOUS | Status: DC | PRN

## 2014-08-18 MED ORDER — IBUPROFEN 200 MG PO TABS
400.0000 mg | ORAL_TABLET | Freq: Once | ORAL | Status: AC
  Administered 2014-08-18: 400 mg via ORAL
  Filled 2014-08-18: qty 2

## 2014-08-18 MED ORDER — SODIUM CHLORIDE 0.9 % IV SOLN: INTRAVENOUS | Status: DC

## 2014-08-18 MED ORDER — INSULIN GLARGINE 100 UNIT/ML ~~LOC~~ SOLN: 16.0000 [IU] | Freq: Every day | SUBCUTANEOUS | Status: DC

## 2014-08-18 MED ORDER — SODIUM CHLORIDE 0.9 % IV SOLN
1.0000 g | Freq: Once | INTRAVENOUS | Status: AC
  Administered 2014-08-18: 1 g via INTRAVENOUS
  Filled 2014-08-18: qty 10

## 2014-08-18 MED ORDER — INSULIN REGULAR BOLUS VIA INFUSION
0.0000 [IU] | Freq: Three times a day (TID) | INTRAVENOUS | Status: AC
  Administered 2014-08-19: 0 [IU] via INTRAVENOUS
  Filled 2014-08-18: qty 10

## 2014-08-18 MED ORDER — ACETAMINOPHEN 325 MG PO TABS
650.0000 mg | ORAL_TABLET | Freq: Four times a day (QID) | ORAL | Status: DC | PRN
  Administered 2014-08-18 – 2014-08-19 (×3): 650 mg via ORAL
  Filled 2014-08-18 (×3): qty 2

## 2014-08-18 MED ORDER — SODIUM BICARBONATE 8.4 % IV SOLN
INTRAVENOUS | Status: DC
Start: 2014-08-18 — End: 2014-08-18
  Administered 2014-08-18: 16:00:00 via INTRAVENOUS
  Filled 2014-08-18: qty 1000

## 2014-08-18 MED ORDER — DEXTROSE-NACL 5-0.45 % IV SOLN
INTRAVENOUS | Status: DC
  Administered 2014-08-18: 17:00:00 via INTRAVENOUS
  Administered 2014-08-19: 1000 mL via INTRAVENOUS
  Administered 2014-08-19 – 2014-08-21 (×6): via INTRAVENOUS

## 2014-08-18 MED ORDER — INSULIN ASPART 100 UNIT/ML ~~LOC~~ SOLN: 0.0000 [IU] | Freq: Three times a day (TID) | SUBCUTANEOUS | Status: DC

## 2014-08-18 MED ORDER — SODIUM CHLORIDE 0.9 % IV BOLUS (SEPSIS)
1000.0000 mL | INTRAVENOUS | Status: AC
  Administered 2014-08-18 (×2): 1000 mL via INTRAVENOUS

## 2014-08-18 MED ORDER — SODIUM CHLORIDE 0.9 % IV SOLN
INTRAVENOUS | Status: AC
  Administered 2014-08-18: 0.9 [IU]/h via INTRAVENOUS
  Administered 2014-08-19: 6.3 [IU]/h via INTRAVENOUS
  Administered 2014-08-19: 4.2 [IU]/h via INTRAVENOUS
  Administered 2014-08-19: 0.4 [IU]/h via INTRAVENOUS
  Filled 2014-08-18 (×2): qty 2.5

## 2014-08-18 NOTE — Progress Notes (Signed)
CRITICAL VALUE ALERT  Critical value received:  Ca 6.1  Date of notification:  6/28  Time of notification:  1305  Critical value read back:Yes.    Nurse who received alert:  Rocky Morel, RN  MD notified (1st page):  Elgergawy  Time of first page:  92  MD notified (2nd page):  Time of second page:  Responding MD:  Elgergawy  Time MD responded:

## 2014-08-18 NOTE — Progress Notes (Signed)
ABG results given to M. Shiflett RN

## 2014-08-18 NOTE — Progress Notes (Signed)
Initial Nutrition Assessment  DOCUMENTATION CODES:  Non-severe (moderate) malnutrition in context of acute illness/injury  INTERVENTION: - Will monitor for needs with diet advancement  NUTRITION DIAGNOSIS:  Inadequate oral intake related to inability to eat as evidenced by NPO status.  GOAL:  Patient will meet greater than or equal to 90% of their needs  MONITOR:  Diet advancement, PO intake, Weight trends, Labs  REASON FOR ASSESSMENT:  Malnutrition Screening Tool  ASSESSMENT: 63 y.o. female with new diagnosis of Diabetes Mellitus presents with DKA. Patient was having vomiting going on since the weekend. She has no abdominal pain. She has been feeling feverish but has not had a documented fever. She has not been able to keep anything down. She has felt dizzy and has been extremely weak. Patient has no blood in the vomitus.   Pt seen for MST. BMI indicates normal weight status. Pt indicates that she has not been feeling well recently but that it got much worse starting Saturday (6/25). Family reports that pt was dx with DM Thursday (6/23). She indicates nausea is ongoing but that last episode of emesis was 11 AM yesterday.  Pt has been NPO since admission and requesting food. Per family, pt started a diet to lose weight 2 months ago and then began to have feelings of dizziness and weakness. Per weight hx review, pt has lost 5 lbs (3% body weight) in the past 4 days which is significant for time frame. Mild muscle and fat wasting noted during physical assessment.  Not able to meet needs. Will need diet education prior to d/c. Medications reviewed. Labs reviewed; CBGs: 134-228 mg/dL, Ca: 8.1 mg/dL, GFR: 55.  Height:  Ht Readings from Last 1 Encounters:  08/17/14 5\' 10"  (1.778 m)    Weight:  Wt Readings from Last 1 Encounters:  08/17/14 169 lb 15.6 oz (77.1 kg)    Ideal Body Weight:  68.2 kg (kg)  Wt Readings from Last 10 Encounters:  08/17/14 169 lb 15.6 oz (77.1 kg)   08/13/14 174 lb (78.926 kg)  01/13/14 191 lb (86.637 kg)  12/24/13 187 lb 4 oz (84.936 kg)  12/10/12 187 lb 3.2 oz (84.913 kg)  12/03/12 191 lb (86.637 kg)  10/10/12 188 lb (85.276 kg)  08/16/12 186 lb (84.369 kg)  08/13/12 186 lb 9.6 oz (84.641 kg)  07/03/12 189 lb 14.4 oz (86.138 kg)    BMI:  Body mass index is 24.39 kg/(m^2).  Estimated Nutritional Needs:  Kcal:  1500-1700  Protein:  75-85 grams  Fluid:  2 L/day  Skin:  Reviewed, no issues  Diet Order:  Diet clear liquid Room service appropriate?: Yes; Fluid consistency:: Thin  EDUCATION NEEDS:  Education needs no appropriate at this time   Intake/Output Summary (Last 24 hours) at 08/18/14 1016 Last data filed at 08/18/14 0919  Gross per 24 hour  Intake 4019.74 ml  Output   3925 ml  Net  94.74 ml    Last BM:  [TA    Jarome Matin, RD, LDN Inpatient Clinical Dietitian Pager # [phone] After hours/weekend pager # [phone]

## 2014-08-18 NOTE — Progress Notes (Addendum)
Patient Demographics  Catherine Mora, is a 64 y.o. female, DOB - April 28, 1950, [place] date - 08/17/2014   Admitting Physician Allyne Gee, MD  Outpatient Primary MD for the patient is Walker Kehr, MD  LOS - 1   Chief Complaint  Patient presents with  . Nausea  . Emesis       Admission HPI/Brief narrative:  Subjective:   Catherine Mora today has, No headache, No chest pain, No further abdominal pain - no vomiting, nausea significantly subsided, No new weakness tingling or numbness, No Cough - SOB  Assessment & Plan    Principal Problem:   DKA (diabetic ketoacidoses) Active Problems:   GERD   HTN (hypertension)   Diabetes mellitus type 2, uncontrolled   AKI (acute kidney injury)   Malnutrition of moderate degree  Anion  gap ketoacidosis - Unlikely due to DKA, as glucose on admission was 276, patient denies any alcohol ingestion, any other substance ingestion. - This is most likely related to her being recently started on invokana . - lactic acid WNL. - Discussed with poison control center, her ketoacidosis most likely due to [facility], plan would be to continue with insulin drip, and continue with D5 fluids, start on regular diet(not carbohydrate modified), regardless of annual gap , to suppress glucagon level which is the mechanism of ketoacidosis in Invokana , no role for bicarbonate drip here discussed with poison control center. - So plan is to continue with insulin drip, regular diet, it can be stopped when her bicarbonate is more than 15 (BMP in am), pH more than 7.3 ( ABG in am), and no further ketosis ( beta hydroxybutyric acid in a.m).  Diabetes mellitus  - patient was recently diagnosed with diabetes her glycohemoglobin A1c is 15.1, please see  discussion above.  Hypertension  - Not taking home medications   GERD  - On PPI   Code Status: full  Family  Communication: none at bedside  Disposition Plan: home when stable   Procedures  none   Consults   none   Medications  Scheduled Meds: . escitalopram  10 mg Oral Daily  . heparin  5,000 Units Subcutaneous 3 times per day  . insulin aspart  0-9 Units Subcutaneous TID WC  . insulin glargine  6 Units Subcutaneous Daily  . pantoprazole  20 mg Oral Daily  . sodium chloride  500 mL Intravenous Q2H   Continuous Infusions: . sodium chloride Stopped (08/18/14 1310)  . dextrose 5 % 1,000 mL with sodium bicarbonate 150 mEq infusion    . dextrose 5 % and 0.45% NaCl 75 mL/hr at 08/18/14 1304   PRN Meds:.ALPRAZolam, ondansetron (ZOFRAN) IV  DVT Prophylaxis   Heparin -  Lab Results  Component Value Date   PLT 246 08/18/2014    Antibiotics    Anti-infectives    None          Objective:   Filed Vitals:   08/18/14 1100 08/18/14 1150 08/18/14 1200 08/18/14 1300  BP: 142/70  129/66 144/68  Pulse: 97  95 96  Temp:  98.3 F (36.8 C)    TempSrc:  Oral    Resp: 15  17 16   Height:      Weight:  SpO2: 99%  99% 99%    Wt Readings from Last 3 Encounters:  08/17/14 77.1 kg (169 lb 15.6 oz)  08/13/14 78.926 kg (174 lb)  01/13/14 86.637 kg (191 lb)     Intake/Output Summary (Last 24 hours) at 08/18/14 1437 Last data filed at 08/18/14 1305  Gross per 24 hour  Intake 5513.65 ml  Output   5725 ml  Net -211.35 ml     Physical Exam  Awake Alert, Oriented X 3, No new F.N deficits, Normal affect [place].AT,PERRAL Supple Neck,No JVD, No cervical lymphadenopathy appriciated.  Symmetrical Chest wall movement, Good air movement bilaterally, CTAB RRR,No Gallops,Rubs or new Murmurs, No Parasternal Heave +ve B.Sounds, Abd Soft, No tenderness, No organomegaly appriciated, No rebound - guarding or rigidity. No Cyanosis, Clubbing or edema, No new Rash or bruise     Data Review   Micro Results Recent Results (from the past 240 hour(s))  MRSA PCR Screening     Status: None    Collection Time: 08/17/14  9:45 PM  Result Value Ref Range Status   MRSA by PCR NEGATIVE NEGATIVE Final    Comment:        The GeneXpert MRSA Assay (FDA approved for NASAL specimens only), is one component of a comprehensive MRSA colonization surveillance program. It is not intended to diagnose MRSA infection nor to guide or monitor treatment for MRSA infections.   Culture, blood (routine x 2)     Status: None (Preliminary result)   Collection Time: 08/17/14 11:30 PM  Result Value Ref Range Status   Specimen Description   Final    BLOOD RIGHT ARM Performed at [facility]    Special Requests BOTTLES DRAWN AEROBIC AND ANAEROBIC [place]  Final   Culture PENDING  Incomplete   Report Status PENDING  Incomplete    Radiology Reports Ct Abdomen Pelvis W Contrast  08/17/2014   CLINICAL DATA:  64 year old female with nausea and vomiting since yesterday. Patient has medical history of IBS, hypertension, with recent diagnosis of diabetes.  EXAM: CT ABDOMEN AND PELVIS WITH CONTRAST  TECHNIQUE: Multidetector CT imaging of the abdomen and pelvis was performed using the standard protocol following bolus administration of intravenous contrast.  CONTRAST:  27mL OMNIPAQUE IOHEXOL 300 MG/ML SOLN, 131mL OMNIPAQUE IOHEXOL 300 MG/ML SOLN  COMPARISON:  Abdominal ultrasound dated 07/02/2012  FINDINGS: Lower chest:  Unremarkable.  Peritoneum: No free air or free fluid.  Liver: Unremarkable.No intrahepatic biliary ductal dilatation.  Gallbladder: Cholecystectomy.  Pancreas: Unremarkable.No ductal dilatation.  Spleen: Unremarkable.  Adrenals glands: Unremarkable.  Kidneys, ureters, urinary bladder: There is minimal fullness of the right renal collecting system. The left kidney is unremarkable.  Reproductive: Hysterectomy.  Bowel and appendix: Constipation. No bowel obstruction. There is mild distention of the stomach without definite evidence of gastric outlet obstruction. A small pocket of air along the  pylorus appears to be intraluminal. There is minimal apparent haziness of the fat surrounding the pylorus and proximal duodenum which may be related to volume averaging artifact. Focal duodenitis is less likely but not excluded. Clinical correlation is recommended. The appendix is not visualized with certainty. No inflammatory changes identified in the right lower quadrant.  Vascular/Lymphatic: Mild atherosclerotic calcification of the aorta.No lymphadenopathy.  Abdominal wall/Musculoskeletal: Small fat containing umbilical hernia. A small supraumbilical peritoneal defect is noted containing small omental fat. Mild degenerative changes of the spine.  IMPRESSION: Mildly distended stomach without definite evidence of gastric outlet or small bowel obstruction.  Minimal apparent haziness of the fat surrounding  the pylorus which may be artifactual or represent mild focal inflammatory changes. Clinical correlation is recommended.   Electronically Signed   By: Anner Crete M.D.   On: 08/17/2014 23:27     CBC  Recent Labs Lab 08/14/14 1014 08/17/14 1835 08/17/14 2330 08/18/14 0800  WBC 7.0 20.0* 17.3* 14.6*  HGB 15.1* 15.3* 15.1* 15.0  HCT 43.9 46.1* 46.1* 45.4  PLT 271.0 324 279 246  MCV 83.9 88.0 89.2 87.6  MCH  --  29.2 29.2 29.0  MCHC 34.4 33.2 32.8 33.0  RDW 12.9 13.2 13.2 13.4  LYMPHSABS 2.0 2.6  --   --   MONOABS 0.3 1.2*  --   --   EOSABS 0.1 0.0  --   --   BASOSABS 0.0 0.0  --   --     Chemistries   Recent Labs Lab 08/14/14 1014 08/17/14 1518 08/17/14 2330 08/18/14 0330 08/18/14 0800 08/18/14 1222  NA 136 137 137 134* 135 138  K 4.9 5.3* 4.6 4.3 4.5 3.8  CL 98 103 111 110 108 119*  CO2 27 6* 6* 8* 8* 6*  GLUCOSE 297* 276* 179* 145* 218* 225*  BUN 13 26* 22* 21* 20 16  CREATININE 0.75 1.24* 1.11* 1.08* 1.05* 0.70  CALCIUM 9.4 9.3 7.8* 7.9* 8.1* 6.1*  AST 21 24  --   --   --   --   ALT 20 20  --   --   --   --   ALKPHOS 112 110  --   --   --   --   BILITOT 0.7 1.5*   --   --   --   --    ------------------------------------------------------------------------------------------------------------------ estimated creatinine clearance is 76.8 mL/min (by C-G formula based on Cr of 0.7). ------------------------------------------------------------------------------------------------------------------ No results for input(s): HGBA1C in the last 72 hours. ------------------------------------------------------------------------------------------------------------------  Recent Labs  08/17/14 2330  CHOL 382*  HDL 48  LDLCALC 269*  TRIG 324*  CHOLHDL 8.0   ------------------------------------------------------------------------------------------------------------------ No results for input(s): TSH, T4TOTAL, T3FREE, THYROIDAB in the last 72 hours.  Invalid input(s): FREET3 ------------------------------------------------------------------------------------------------------------------ No results for input(s): VITAMINB12, FOLATE, FERRITIN, TIBC, IRON, RETICCTPCT in the last 72 hours.  Coagulation profile No results for input(s): INR, PROTIME in the last 168 hours.  No results for input(s): DDIMER in the last 72 hours.  Cardiac Enzymes No results for input(s): CKMB, TROPONINI, MYOGLOBIN in the last 168 hours.  Invalid input(s): CK ------------------------------------------------------------------------------------------------------------------ Invalid input(s): POCBNP     Time Spent in minutes   30 minutes   Renelle Stegenga M.D on 08/18/2014 at 2:37 PM  Between 7am to 7pm - Pager - [phone]  After 7pm go to [email or web address] - password [facility]  [facility]   Office  [phone]

## 2014-08-19 DIAGNOSIS — E1165 Type 2 diabetes mellitus with hyperglycemia: Secondary | ICD-10-CM

## 2014-08-19 DIAGNOSIS — I10 Essential (primary) hypertension: Secondary | ICD-10-CM

## 2014-08-19 DIAGNOSIS — K219 Gastro-esophageal reflux disease without esophagitis: Secondary | ICD-10-CM

## 2014-08-19 DIAGNOSIS — E1310 Other specified diabetes mellitus with ketoacidosis without coma: Principal | ICD-10-CM

## 2014-08-19 LAB — BLOOD GAS, ARTERIAL
ACID-BASE DEFICIT: 14.2 mmol/L — AB (ref 0.0–2.0)
Bicarbonate: 11.6 mEq/L — ABNORMAL LOW (ref 20.0–24.0)
DRAWN BY: 232811
FIO2: 0.21 %
O2 SAT: 97.3 %
PO2 ART: 84.1 mmHg (ref 80.0–100.0)
Patient temperature: 98.3
TCO2: 10.7 mmol/L (ref 0–100)
pCO2 arterial: 27.3 mmHg — ABNORMAL LOW (ref 35.0–45.0)
pH, Arterial: 7.249 — ABNORMAL LOW (ref 7.350–7.450)

## 2014-08-19 LAB — GLUCOSE, CAPILLARY
GLUCOSE-CAPILLARY: 102 mg/dL — AB (ref 65–99)
GLUCOSE-CAPILLARY: 113 mg/dL — AB (ref 65–99)
GLUCOSE-CAPILLARY: 136 mg/dL — AB (ref 65–99)
GLUCOSE-CAPILLARY: 143 mg/dL — AB (ref 65–99)
GLUCOSE-CAPILLARY: 178 mg/dL — AB (ref 65–99)
GLUCOSE-CAPILLARY: 200 mg/dL — AB (ref 65–99)
GLUCOSE-CAPILLARY: 211 mg/dL — AB (ref 65–99)
GLUCOSE-CAPILLARY: 214 mg/dL — AB (ref 65–99)
Glucose-Capillary: 133 mg/dL — ABNORMAL HIGH (ref 65–99)
Glucose-Capillary: 99 mg/dL (ref 65–99)
Glucose-Capillary: 110 mg/dL — ABNORMAL HIGH (ref 65–99)
Glucose-Capillary: 115 mg/dL — ABNORMAL HIGH (ref 65–99)
Glucose-Capillary: 147 mg/dL — ABNORMAL HIGH (ref 65–99)
Glucose-Capillary: 142 mg/dL — ABNORMAL HIGH (ref 65–99)
Glucose-Capillary: 238 mg/dL — ABNORMAL HIGH (ref 65–99)
Glucose-Capillary: 223 mg/dL — ABNORMAL HIGH (ref 65–99)
Glucose-Capillary: 186 mg/dL — ABNORMAL HIGH (ref 65–99)
Glucose-Capillary: 119 mg/dL — ABNORMAL HIGH (ref 65–99)
Glucose-Capillary: 120 mg/dL — ABNORMAL HIGH (ref 65–99)
Glucose-Capillary: 109 mg/dL — ABNORMAL HIGH (ref 65–99)
Glucose-Capillary: 161 mg/dL — ABNORMAL HIGH (ref 65–99)

## 2014-08-19 LAB — BASIC METABOLIC PANEL
ANION GAP: 11 (ref 5–15)
ANION GAP: 11 (ref 5–15)
BUN: 13 mg/dL (ref 6–20)
BUN: 11 mg/dL (ref 6–20)
CALCIUM: 8.3 mg/dL — AB (ref 8.9–10.3)
CALCIUM: 8 mg/dL — AB (ref 8.9–10.3)
CO2: 13 mmol/L — ABNORMAL LOW (ref 22–32)
CO2: 14 mmol/L — AB (ref 22–32)
CREATININE: 0.83 mg/dL (ref 0.44–1.00)
CREATININE: 0.75 mg/dL (ref 0.44–1.00)
Chloride: 113 mmol/L — ABNORMAL HIGH (ref 101–111)
Chloride: 112 mmol/L — ABNORMAL HIGH (ref 101–111)
GFR calc Af Amer: >60 mL/min (ref >60)
GFR calc non Af Amer: >60 mL/min (ref >60)
Glucose, Bld: 138 mg/dL — ABNORMAL HIGH (ref 65–99)
Glucose, Bld: 140 mg/dL — ABNORMAL HIGH (ref 65–99)
Potassium: 3.2 mmol/L — ABNORMAL LOW (ref 3.5–5.1)
Potassium: 3.4 mmol/L — ABNORMAL LOW (ref 3.5–5.1)
SODIUM: 137 mmol/L (ref 135–145)
Sodium: 137 mmol/L (ref 135–145)

## 2014-08-19 LAB — URINE MICROSCOPIC-ADD ON

## 2014-08-19 LAB — URINALYSIS, ROUTINE W REFLEX MICROSCOPIC
Glucose, UA: >1000 mg/dL — AB
Leukocytes, UA: NEGATIVE
NITRITE: NEGATIVE
PH: 5.5 (ref 5.0–8.0)
PROTEIN: NEGATIVE mg/dL
SPECIFIC GRAVITY, URINE: 1.02 (ref 1.005–1.030)
UROBILINOGEN UA: 0.2 mg/dL (ref 0.0–1.0)

## 2014-08-19 LAB — URINE CULTURE

## 2014-08-19 LAB — BETA-HYDROXYBUTYRIC ACID: Beta-Hydroxybutyric Acid: 4.41 mmol/L — ABNORMAL HIGH (ref 0.05–0.27)

## 2014-08-19 LAB — HEMOGLOBIN A1C
Hgb A1c MFr Bld: 14.3 % — ABNORMAL HIGH (ref 4.8–5.6)
Mean Plasma Glucose: 364 mg/dL

## 2014-08-19 MED ORDER — LIVING WELL WITH DIABETES BOOK
Freq: Once | Status: AC
  Administered 2014-08-19: 12:00:00
  Filled 2014-08-19: qty 1

## 2014-08-19 MED ORDER — PROMETHAZINE HCL 25 MG/ML IJ SOLN
12.5000 mg | Freq: Once | INTRAMUSCULAR | Status: AC
  Administered 2014-08-19: 12.5 mg via INTRAVENOUS
  Filled 2014-08-19: qty 1

## 2014-08-19 MED ORDER — IBUPROFEN 200 MG PO TABS
600.0000 mg | ORAL_TABLET | Freq: Once | ORAL | Status: AC
  Administered 2014-08-19: 600 mg via ORAL
  Filled 2014-08-19: qty 3

## 2014-08-19 MED ORDER — PROMETHAZINE HCL 25 MG/ML IJ SOLN
12.5000 mg | Freq: Four times a day (QID) | INTRAMUSCULAR | Status: DC | PRN
Start: 2014-08-19 — End: 2014-08-22

## 2014-08-19 MED ORDER — POTASSIUM CHLORIDE CRYS ER 20 MEQ PO TBCR
40.0000 meq | EXTENDED_RELEASE_TABLET | Freq: Once | ORAL | Status: AC
  Administered 2014-08-19: 40 meq via ORAL
  Filled 2014-08-19: qty 2

## 2014-08-19 NOTE — Progress Notes (Signed)
Received patient via wheel chair from ICU. Pt being treated for DKA after coming in with complaints of nausea, vomiting and headache. Pt currently on Insulin drip @1 .2 units/hr and Glucosestabilizer with last reported FS  Of 120 mg @ 1800. Pt only complaints at this time is a headache, constipation  and fatigue. Pt states she is on xanax BID at home, takes advil for her headache and has not had a BM since Saturday. Pt on a regular diet, no carb control. Last Bicarb of 14, BMP schedule for AM. Pt alert and oriented. Family at bedside. Pt ambulatory without assistance. VS assessed, placed on telemetry.

## 2014-08-19 NOTE — Progress Notes (Addendum)
[facility]                                                                              Patient Demographics  Catherine Mora, is a 64 y.o. female, DOB - 12/25/1950, [place] date - 08/17/2014   Admitting Physician Allyne Gee, MD  Outpatient Primary MD for the patient is Walker Kehr, MD  LOS - 2   Chief Complaint  Patient presents with  . Nausea  . Emesis       HPI on 08/17/2014 by Dr. Devona Konig Catherine Mora is a 64 y.o. female with new diagnosis of Diabetes Mellitus presents with DKA. Patient was having vomiting going on since the weekend. She has no abdominal pain. She has been feeling feverish but has not had a documented fever. She has not been able to keep anything down. She has felt dizzy and has been extremely weak. Patient has no blood in the vomitus. She has no diarrhea noted. She has no blood in the stools noted. Patient was seen in the office today and was advised to come to the ED. In the ED she had labwork showing elevatd glucose with an AG of 28 and also with a CO2 of 6 on presentation. It appears she was diagnosed last week with DM and started on Invokamet.  Assessment & Plan  Anion gap ketoacidosis -Unlikely secondary to DKA as glucose upon admission was 276 -Patient denied any alcohol ingestion or other substance abuse -Likely secondary to invokana -Lactic acid normal -[place] recommended continuing insulin drip with regular diet until bicarbonate is greater than 15, pH greater than 7.3, no further ketosis -Currently, pH 7.249, bicarb 14, beta-hydroxybutyric 4.41 (all improving)  Diabetes mellitus, type 2 -Hemoglobin A1c 14.3  Hypertension -Currently on no home medications -Stable  GERD -Continue PPI  Hypokalemia -Potassium 3.4, will replace and continue to monitor  Depression -Continue lexapro   Acute kidney injury -Resolved  Code Status: Full  Family Communication: Sister at bedside  Disposition  Plan: Admitted.  Continue glucosestabilizer   Time Spent in minutes   30 minutes  Procedures  None  Consults   None  DVT Prophylaxis  heparin  Lab Results  Component Value Date   PLT 246 08/18/2014    Medications  Scheduled Meds: . escitalopram  10 mg Oral Daily  . heparin  5,000 Units Subcutaneous 3 times per day  . insulin regular  0-10 Units Intravenous TID WC  . pantoprazole  20 mg Oral Daily   Continuous Infusions: . sodium chloride    . dextrose 5 % and 0.45% NaCl 125 mL/hr at 08/19/14 0906  . insulin (NOVOLIN-R) infusion 1.8 Units/hr (08/19/14 1027)   PRN Meds:.acetaminophen, ALPRAZolam, dextrose, ondansetron (ZOFRAN) IV, promethazine  Antibiotics    Anti-infectives    None      Subjective:   Catherine Mora seen and examined today.  States she is feeling better and hopes she does not need more lab work.  Denies chest pain, shortness of breath, headache, dizziness. Does complain of some continued nausea and abdominal pain.    Objective:   Filed Vitals:   08/19/14 0600 08/19/14 0700 08/19/14 0800 08/19/14 0813  BP: 118/64 114/67 132/67   Pulse: 86 83 77   Temp:    97.9 F (36.6 C)  TempSrc:    Oral  Resp: 14 15 14    Height:      Weight:      SpO2: 99% 99% 100%     Wt Readings from Last 3 Encounters:  08/17/14 77.1 kg (169 lb 15.6 oz)  08/13/14 78.926 kg (174 lb)  01/13/14 86.637 kg (191 lb)     Intake/Output Summary (Last 24 hours) at 08/19/14 1101 Last data filed at 08/19/14 1000  Gross per 24 hour  Intake 5877.07 ml  Output   4425 ml  Net 1452.07 ml    Exam  General: Well developed, well nourished, NAD, appears stated age  HEENT: NCAT,  mucous membranes moist.   Cardiovascular: S1 S2 auscultated, no rubs, murmurs or gallops. Regular rate and rhythm.  Respiratory: Clear to auscultation bilaterally with equal chest rise  Abdomen: Soft, nontender, nondistended, + bowel sounds  Extremities: warm dry without cyanosis clubbing or  edema  Neuro: AAOx3, nonfocal  Psych: Normal affect and demeanor with intact judgement and insight  Data Review   Micro Results Recent Results (from the past 240 hour(s))  MRSA PCR Screening     Status: None   Collection Time: 08/17/14  9:45 PM  Result Value Ref Range Status   MRSA by PCR NEGATIVE NEGATIVE Final    Comment:        The GeneXpert MRSA Assay (FDA approved for NASAL specimens only), is one component of a comprehensive MRSA colonization surveillance program. It is not intended to diagnose MRSA infection nor to guide or monitor treatment for MRSA infections.   Culture, blood (routine x 2)     Status: None (Preliminary result)   Collection Time: 08/17/14 11:30 PM  Result Value Ref Range Status   Specimen Description   Final    BLOOD RIGHT ARM Performed at [facility]    Special Requests BOTTLES DRAWN AEROBIC AND ANAEROBIC [place]  Final   Culture PENDING  Incomplete   Report Status PENDING  Incomplete    Radiology Reports Ct Abdomen Pelvis W Contrast  08/17/2014   CLINICAL DATA:  64 year old female with nausea and vomiting since yesterday. Patient has medical history of IBS, hypertension, with recent diagnosis of diabetes.  EXAM: CT ABDOMEN AND PELVIS WITH CONTRAST  TECHNIQUE: Multidetector CT imaging of the abdomen and pelvis was performed using the standard protocol following bolus administration of intravenous contrast.  CONTRAST:  58mL OMNIPAQUE IOHEXOL 300 MG/ML SOLN, 17mL OMNIPAQUE IOHEXOL 300 MG/ML SOLN  COMPARISON:  Abdominal ultrasound dated 07/02/2012  FINDINGS: Lower chest:  Unremarkable.  Peritoneum: No free air or free fluid.  Liver: Unremarkable.No intrahepatic biliary ductal dilatation.  Gallbladder: Cholecystectomy.  Pancreas: Unremarkable.No ductal dilatation.  Spleen: Unremarkable.  Adrenals glands: Unremarkable.  Kidneys, ureters, urinary bladder: There is minimal fullness of the right renal collecting system. The left kidney is unremarkable.   Reproductive: Hysterectomy.  Bowel and appendix: Constipation. No bowel obstruction. There is mild distention of the stomach without definite evidence of gastric outlet obstruction. A small pocket of air along the pylorus appears to be intraluminal. There is minimal apparent haziness of the fat surrounding the pylorus and proximal duodenum which may be related to volume averaging artifact. Focal duodenitis is less likely but not excluded. Clinical correlation is recommended. The appendix is not visualized with certainty. No inflammatory changes identified in the right lower quadrant.  Vascular/Lymphatic: Mild atherosclerotic calcification of  the aorta.No lymphadenopathy.  Abdominal wall/Musculoskeletal: Small fat containing umbilical hernia. A small supraumbilical peritoneal defect is noted containing small omental fat. Mild degenerative changes of the spine.  IMPRESSION: Mildly distended stomach without definite evidence of gastric outlet or small bowel obstruction.  Minimal apparent haziness of the fat surrounding the pylorus which may be artifactual or represent mild focal inflammatory changes. Clinical correlation is recommended.   Electronically Signed   By: Anner Crete M.D.   On: 08/17/2014 23:27    CBC  Recent Labs Lab 08/14/14 1014 08/17/14 1835 08/17/14 2330 08/18/14 0800  WBC 7.0 20.0* 17.3* 14.6*  HGB 15.1* 15.3* 15.1* 15.0  HCT 43.9 46.1* 46.1* 45.4  PLT 271.0 324 279 246  MCV 83.9 88.0 89.2 87.6  MCH  --  29.2 29.2 29.0  MCHC 34.4 33.2 32.8 33.0  RDW 12.9 13.2 13.2 13.4  LYMPHSABS 2.0 2.6  --   --   MONOABS 0.3 1.2*  --   --   EOSABS 0.1 0.0  --   --   BASOSABS 0.0 0.0  --   --     Chemistries   Recent Labs Lab 08/14/14 1014 08/17/14 1518  08/18/14 1222 08/18/14 1600 08/18/14 1800 08/18/14 2357 08/19/14 0615  NA 136 137  < > 138 138 136 137 137  K 4.9 5.3*  < > 3.8 4.8 4.2 3.2* 3.4*  CL 98 103  < > 119* 115* 113* 113* 112*  CO2 27 6*  < > 6* 9* 9* 13* 14*    GLUCOSE 297* 276*  < > 225* 150* 229* 138* 140*  BUN 13 26*  < > 16 16 15 13 11   CREATININE 0.75 1.24*  < > 0.70 0.85 0.90 0.83 0.75  CALCIUM 9.4 9.3  < > 6.1* 7.9* 7.7* 8.3* 8.0*  AST 21 24  --   --   --   --   --   --   ALT 20 20  --   --   --   --   --   --   ALKPHOS 112 110  --   --   --   --   --   --   BILITOT 0.7 1.5*  --   --   --   --   --   --   < > = values in this interval not displayed. ------------------------------------------------------------------------------------------------------------------ estimated creatinine clearance is 76.8 mL/min (by C-G formula based on Cr of 0.75). ------------------------------------------------------------------------------------------------------------------  Recent Labs  08/17/14 2330  HGBA1C 14.3*   ------------------------------------------------------------------------------------------------------------------  Recent Labs  08/17/14 2330  CHOL 382*  HDL 48  LDLCALC 269*  TRIG 324*  CHOLHDL 8.0   ------------------------------------------------------------------------------------------------------------------ No results for input(s): TSH, T4TOTAL, T3FREE, THYROIDAB in the last 72 hours.  Invalid input(s): FREET3 ------------------------------------------------------------------------------------------------------------------ No results for input(s): VITAMINB12, FOLATE, FERRITIN, TIBC, IRON, RETICCTPCT in the last 72 hours.  Coagulation profile No results for input(s): INR, PROTIME in the last 168 hours.  No results for input(s): DDIMER in the last 72 hours.  Cardiac Enzymes No results for input(s): CKMB, TROPONINI, MYOGLOBIN in the last 168 hours.  Invalid input(s): CK ------------------------------------------------------------------------------------------------------------------ Invalid input(s): POCBNP    Jamice Carreno D.O. on 08/19/2014 at 11:01 AM  Between 7am to 7pm - Pager - [phone]  After 7pm  go to [email or web address] - password [email or web address]  And look for the night coverage person covering for me after hours  [facility] Office  [phone]

## 2014-08-19 NOTE — Progress Notes (Signed)
Inpatient Diabetes Program Recommendations  AACE/ADA: New Consensus Statement on Inpatient Glycemic Control (2013)  Target Ranges:  Prepandial:   less than 140 mg/dL      Peak postprandial:   less than 180 mg/dL (1-2 hours)      Critically ill patients:  140 - 180 mg/dL   Reason for Visit: DKA  Diabetes history: DM2 - recently diagnosed Outpatient Diabetes medications: Invokamet 150/500 mg bid Current orders for Inpatient glycemic control: IV insulin per GlucoStabilizer  64 year old female admitted with nausea and vomiting, recently diagnosed with DM and had started on Invokamet within the last few days. Pt states MD said she was pre-diabetic prior to [place] on 08/13/2014.  Pt states she's had polyuria, polydipsia in the last few weeks. Has been drinking regular sodas. Interested in diabetes education.  Results for LENAYA, PIETSCH (MRN [id number]) as of 08/19/2014 11:47  Ref. Range 08/19/2014 06:56 08/19/2014 08:11 08/19/2014 09:18 08/19/2014 10:23 08/19/2014 11:26  Glucose-Capillary Latest Ref Range: 65-99 mg/dL 147 (H) 142 (H) 143 (H) 238 (H) 223 (H)  Results for CARLINDA, OHLSON (MRN [id number]) as of 08/19/2014 11:47  Ref. Range 08/14/2014 10:14 08/17/2014 23:30  Hemoglobin A1C Latest Ref Range: 4.8-5.6 % 15.1 (H) 14.3 (H)  Results for KASONDRA, JUNOD (MRN [id number]) as of 08/19/2014 11:47  Ref. Range 08/19/2014 06:15  Sodium Latest Ref Range: 135-145 mmol/L 137  Potassium Latest Ref Range: 3.5-5.1 mmol/L 3.4 (L)  Chloride Latest Ref Range: 101-111 mmol/L 112 (H)  CO2 Latest Ref Range: 22-32 mmol/L 14 (L)  BUN Latest Ref Range: 6-20 mg/dL 11  Creatinine Latest Ref Range: 0.44-1.00 mg/dL 0.75  Calcium Latest Ref Range: 8.9-10.3 mg/dL 8.0 (L)  EGFR (Non-African Amer.) Latest Ref Range: >60 mL/min >60  EGFR (African American) Latest Ref Range: >60 mL/min >60  Glucose Latest Ref Range: 65-99 mg/dL 140 (H)  Anion gap Latest Ref Range: 5-15  11    Inpatient Diabetes Program  Recommendations Insulin - IV drip/GlucoStabilizer:  Continue with GlucoStabilizer until bicarb > 15, pH > 7.3 and no ketosis HgbA1C: 14.3% - uncontrolled  Basal: Levemir 14 units Q24H (give 2 hours prior to discontinuation of insulin drip) Correction: Novolog sensitive tidwc and hs  Will order Living Well With Diabetes book and OP Diabetes Education consult. Will need appt with PCP within 1 week of discharge to follow for diabetes management. Will continue to follow.  Thank you. Lorenda Peck, RD, LDN, CDE Inpatient Diabetes Coordinator [phone]

## 2014-08-20 DIAGNOSIS — N179 Acute kidney failure, unspecified: Secondary | ICD-10-CM

## 2014-08-20 LAB — CBC
HCT: 35.3 % — ABNORMAL LOW (ref 36.0–46.0)
Hemoglobin: 12.1 g/dL (ref 12.0–15.0)
MCH: 28.6 pg (ref 26.0–34.0)
MCHC: 34.3 g/dL (ref 30.0–36.0)
MCV: 83.5 fL (ref 78.0–100.0)
Platelets: 193 10*3/uL (ref 150–400)
RBC: 4.23 MIL/uL (ref 3.87–5.11)
RDW: 13.5 % (ref 11.5–15.5)
WBC: 5.1 10*3/uL (ref 4.0–10.5)

## 2014-08-20 LAB — BLOOD GAS, ARTERIAL
ACID-BASE DEFICIT: 3.5 mmol/L — AB (ref 0.0–2.0)
Bicarbonate: 20.7 mEq/L (ref 20.0–24.0)
DRAWN BY: 31814
FIO2: 0.21 %
O2 Saturation: 96.8 %
Patient temperature: 98
TCO2: 18.7 mmol/L (ref 0–100)
pCO2 arterial: 36 mmHg (ref 35.0–45.0)
pH, Arterial: 7.377 (ref 7.350–7.450)
pO2, Arterial: 74.5 mmHg — ABNORMAL LOW (ref 80.0–100.0)

## 2014-08-20 LAB — MAGNESIUM: MAGNESIUM: 1.9 mg/dL (ref 1.7–2.4)

## 2014-08-20 LAB — GLUCOSE, CAPILLARY
GLUCOSE-CAPILLARY: 156 mg/dL — AB (ref 65–99)
GLUCOSE-CAPILLARY: 148 mg/dL — AB (ref 65–99)
GLUCOSE-CAPILLARY: 192 mg/dL — AB (ref 65–99)
GLUCOSE-CAPILLARY: 144 mg/dL — AB (ref 65–99)
GLUCOSE-CAPILLARY: 290 mg/dL — AB (ref 65–99)
Glucose-Capillary: 120 mg/dL — ABNORMAL HIGH (ref 65–99)
Glucose-Capillary: 125 mg/dL — ABNORMAL HIGH (ref 65–99)
Glucose-Capillary: 125 mg/dL — ABNORMAL HIGH (ref 65–99)
Glucose-Capillary: 139 mg/dL — ABNORMAL HIGH (ref 65–99)
Glucose-Capillary: 168 mg/dL — ABNORMAL HIGH (ref 65–99)
Glucose-Capillary: 196 mg/dL — ABNORMAL HIGH (ref 65–99)
Glucose-Capillary: 206 mg/dL — ABNORMAL HIGH (ref 65–99)
Glucose-Capillary: 206 mg/dL — ABNORMAL HIGH (ref 65–99)
Glucose-Capillary: 234 mg/dL — ABNORMAL HIGH (ref 65–99)
Glucose-Capillary: 298 mg/dL — ABNORMAL HIGH (ref 65–99)

## 2014-08-20 LAB — BASIC METABOLIC PANEL
Anion gap: 7 (ref 5–15)
Anion gap: 7 (ref 5–15)
BUN: 11 mg/dL (ref 6–20)
BUN: 11 mg/dL (ref 6–20)
CO2: 21 mmol/L — ABNORMAL LOW (ref 22–32)
CO2: 22 mmol/L (ref 22–32)
Calcium: 8.1 mg/dL — ABNORMAL LOW (ref 8.9–10.3)
Calcium: 7.9 mg/dL — ABNORMAL LOW (ref 8.9–10.3)
Chloride: 111 mmol/L (ref 101–111)
Chloride: 109 mmol/L (ref 101–111)
Creatinine, Ser: 0.69 mg/dL (ref 0.44–1.00)
Creatinine, Ser: 0.67 mg/dL (ref 0.44–1.00)
GFR calc Af Amer: >60 mL/min (ref >60)
GFR calc Af Amer: >60 mL/min (ref >60)
GFR calc non Af Amer: >60 mL/min (ref >60)
Glucose, Bld: 126 mg/dL — ABNORMAL HIGH (ref 65–99)
Glucose, Bld: 137 mg/dL — ABNORMAL HIGH (ref 65–99)
POTASSIUM: 2.8 mmol/L — AB (ref 3.5–5.1)
Potassium: 2.5 mmol/L — CL (ref 3.5–5.1)
SODIUM: 139 mmol/L (ref 135–145)
Sodium: 138 mmol/L (ref 135–145)

## 2014-08-20 LAB — BETA-HYDROXYBUTYRIC ACID: BETA-HYDROXYBUTYRIC ACID: 0.89 mmol/L — AB (ref 0.05–0.27)

## 2014-08-20 MED ORDER — POTASSIUM CHLORIDE CRYS ER 20 MEQ PO TBCR
40.0000 meq | EXTENDED_RELEASE_TABLET | Freq: Once | ORAL | Status: AC
  Administered 2014-08-20: 40 meq via ORAL
  Filled 2014-08-20: qty 2

## 2014-08-20 MED ORDER — FLEET ENEMA 7-19 GM/118ML RE ENEM
1.0000 | ENEMA | Freq: Once | RECTAL | Status: AC
  Administered 2014-08-21: 1 via RECTAL
  Filled 2014-08-20: qty 1

## 2014-08-20 MED ORDER — POTASSIUM CHLORIDE 10 MEQ/100ML IV SOLN
10.0000 meq | INTRAVENOUS | Status: AC
  Administered 2014-08-20 (×4): 10 meq via INTRAVENOUS
  Filled 2014-08-20 (×4): qty 100

## 2014-08-20 MED ORDER — INSULIN DETEMIR 100 UNIT/ML ~~LOC~~ SOLN
14.0000 [IU] | Freq: Every day | SUBCUTANEOUS | Status: DC
  Administered 2014-08-20 – 2014-08-22 (×3): 14 [IU] via SUBCUTANEOUS
  Filled 2014-08-20 (×3): qty 0.14

## 2014-08-20 MED ORDER — INSULIN ASPART 100 UNIT/ML ~~LOC~~ SOLN
0.0000 [IU] | Freq: Every day | SUBCUTANEOUS | Status: DC
  Administered 2014-08-20: 3 [IU] via SUBCUTANEOUS

## 2014-08-20 MED ORDER — INSULIN ASPART 100 UNIT/ML ~~LOC~~ SOLN
0.0000 [IU] | Freq: Three times a day (TID) | SUBCUTANEOUS | Status: DC
  Administered 2014-08-20: 2 [IU] via SUBCUTANEOUS
  Administered 2014-08-20: 1 [IU] via SUBCUTANEOUS
  Administered 2014-08-21: 3 [IU] via SUBCUTANEOUS
  Administered 2014-08-21 (×2): 2 [IU] via SUBCUTANEOUS
  Administered 2014-08-22: 1 [IU] via SUBCUTANEOUS
  Administered 2014-08-22: 2 [IU] via SUBCUTANEOUS

## 2014-08-20 MED ORDER — BISACODYL 5 MG PO TBEC
5.0000 mg | DELAYED_RELEASE_TABLET | Freq: Every day | ORAL | Status: DC | PRN
  Administered 2014-08-20 – 2014-08-21 (×2): 5 mg via ORAL
  Filled 2014-08-20 (×3): qty 1

## 2014-08-20 MED ORDER — BISACODYL 10 MG RE SUPP
10.0000 mg | Freq: Once | RECTAL | Status: AC
  Administered 2014-08-20: 10 mg via RECTAL
  Filled 2014-08-20: qty 1

## 2014-08-20 NOTE — Care Management Note (Signed)
Case Management Note  Patient Details  Name: Catherine Mora MRN: [id number] Date of Birth: [birth year]  Subjective/Objective:  Transfer from SDU. DKA.From home.                  Action/Plan:Monitor progress for d/c needs.   Expected Discharge Date:   (unknown)               Expected Discharge Plan:  Home/Self Care  In-House Referral:     Discharge planning Services  CM Consult  Post Acute Care Choice:    Choice offered to:     DME Arranged:    DME Agency:     HH Arranged:    HH Agency:     Status of Service:  In process, will continue to follow  Medicare Important Message Given:    Date Medicare IM Given:    Medicare IM give by:    Date Additional Medicare IM Given:    Additional Medicare Important Message give by:     If discussed at [place] of Stay Meetings, dates discussed:    Additional Comments:  Dessa Phi, RN 08/20/2014, 2:42 PM

## 2014-08-20 NOTE — Progress Notes (Signed)
[facility]                                                                              Patient Demographics  Catherine Mora, is a 64 y.o. female, DOB - 01-29-1951, [place] date - 08/17/2014   Admitting Physician Allyne Gee, MD  Outpatient Primary MD for the patient is Walker Kehr, MD  LOS - 3   Chief Complaint  Patient presents with  . Nausea  . Emesis       HPI on 08/17/2014 by Dr. Devona Konig Catherine Mora is a 64 y.o. female with new diagnosis of Diabetes Mellitus presents with DKA. Patient was having vomiting going on since the weekend. She has no abdominal pain. She has been feeling feverish but has not had a documented fever. She has not been able to keep anything down. She has felt dizzy and has been extremely weak. Patient has no blood in the vomitus. She has no diarrhea noted. She has no blood in the stools noted. Patient was seen in the office today and was advised to come to the ED. In the ED she had labwork showing elevatd glucose with an AG of 28 and also with a CO2 of 6 on presentation. It appears she was diagnosed last week with DM and started on Invokamet.  Assessment & Plan  Anion gap ketoacidosis -Unlikely secondary to DKA as glucose upon admission was 276 -Patient denied any alcohol ingestion or other substance abuse -Likely secondary to invokana -Lactic acid normal -[place] recommended continuing insulin drip with regular diet until bicarbonate is greater than 15, pH greater than 7.3, no further ketosis -Currently, pH 7.377, bicarb 22, beta-hydroxybutyric 0.89 (all improving) -Patient's labs are improving, will wean off of insulin drip today.  Will transition to levemir 14units daily, and ISS  -diabetes coordinator consulted and appreciated  Diabetes mellitus, type 2 -Hemoglobin A1c 14.3 -Treatment and plan as above  Hypertension -Currently on no home medications -Stable  GERD -Continue  PPI  Hypokalemia -Potassium 2.5, will replace and continue to monitor -Magnesium pending -Secondary to insulin drip  Depression -Continue lexapro   Acute kidney injury -Resolved  Code Status: Full  Family Communication: Son at bedside  Disposition Plan: Admitted.  Will wean off of glucostabilizer and transition to levemir with ISS.  Time Spent in minutes   30 minutes  Procedures  None  Consults   Diabetes coordinator  DVT Prophylaxis  heparin  Lab Results  Component Value Date   PLT 193 08/20/2014    Medications  Scheduled Meds: . escitalopram  10 mg Oral Daily  . heparin  5,000 Units Subcutaneous 3 times per day  . insulin aspart  0-5 Units Subcutaneous QHS  . insulin aspart  0-9 Units Subcutaneous TID WC  . insulin detemir  14 Units Subcutaneous Daily  . insulin regular  0-10 Units Intravenous TID WC  . pantoprazole  20 mg Oral Daily  . potassium chloride  10 mEq Intravenous Q1 Hr x 4   Continuous Infusions: . sodium chloride    . dextrose 5 % and 0.45% NaCl 125 mL/hr at 08/20/14 0814  . insulin (NOVOLIN-R) infusion 1.5 Units/hr (08/20/14  1035)   PRN Meds:.acetaminophen, ALPRAZolam, dextrose, ondansetron (ZOFRAN) IV, promethazine  Antibiotics    Anti-infectives    None      Subjective:   Tya Haughey seen and examined today.  States she is feeling better.  Denies chest pain, shortness of breath, headache, dizziness, abdominal pain, nausea, vomiting.   Objective:   Filed Vitals:   08/19/14 1828 08/19/14 2043 08/20/14 0029 08/20/14 0507  BP: 116/81 130/72 118/68 125/69  Pulse: 72 72 78 75  Temp: 98.3 F (36.8 C) 98.4 F (36.9 C) 98 F (36.7 C) 98.1 F (36.7 C)  TempSrc: Oral Oral Oral Oral  Resp: 18 18 18 18   Height:      Weight:      SpO2: 100% 98% 97% 99%    Wt Readings from Last 3 Encounters:  08/17/14 77.1 kg (169 lb 15.6 oz)  08/13/14 78.926 kg (174 lb)  01/13/14 86.637 kg (191 lb)     Intake/Output Summary (Last 24  hours) at 08/20/14 1131 Last data filed at 08/20/14 1032  Gross per 24 hour  Intake   1009 ml  Output    950 ml  Net     59 ml    Exam  General: Well developed, well nourished, no distress  HEENT: NCAT,  mucous membranes moist.   Cardiovascular: S1 S2 auscultated, RRR, no murmurs  Respiratory: Clear to auscultation  Abdomen: Soft, nontender, nondistended, + bowel sounds  Extremities: warm dry without cyanosis clubbing or edema  Neuro: AAOx3, nonfocal  Psych: Normal affect and demeanor   Data Review   Micro Results Recent Results (from the past 240 hour(s))  Urine culture     Status: None   Collection Time: 08/17/14  8:51 PM  Result Value Ref Range Status   Specimen Description URINE, CLEAN CATCH  Final   Special Requests NONE  Final   Culture   Final    MULTIPLE SPECIES PRESENT, SUGGEST RECOLLECTION IF CLINICALLY INDICATED Performed at [facility]    Report Status 08/19/2014 FINAL  Final  MRSA PCR Screening     Status: None   Collection Time: 08/17/14  9:45 PM  Result Value Ref Range Status   MRSA by PCR NEGATIVE NEGATIVE Final    Comment:        The GeneXpert MRSA Assay (FDA approved for NASAL specimens only), is one component of a comprehensive MRSA colonization surveillance program. It is not intended to diagnose MRSA infection nor to guide or monitor treatment for MRSA infections.   Culture, blood (routine x 2)     Status: None (Preliminary result)   Collection Time: 08/17/14 11:30 PM  Result Value Ref Range Status   Specimen Description BLOOD RIGHT ARM  Final   Special Requests BOTTLES DRAWN AEROBIC AND ANAEROBIC 8CC  Final   Culture   Final    NO GROWTH 1 DAY Performed at [facility]    Report Status PENDING  Incomplete  Culture, blood (routine x 2)     Status: None (Preliminary result)   Collection Time: 08/17/14 11:35 PM  Result Value Ref Range Status   Specimen Description BLOOD BLOOD RIGHT HAND  Final   Special Requests  BOTTLES DRAWN AEROBIC ONLY 5CC  Final   Culture   Final    NO GROWTH 1 DAY Performed at [facility]    Report Status PENDING  Incomplete    Radiology Reports Ct Abdomen Pelvis W Contrast  08/17/2014   CLINICAL DATA:  64 year old female with  nausea and vomiting since yesterday. Patient has medical history of IBS, hypertension, with recent diagnosis of diabetes.  EXAM: CT ABDOMEN AND PELVIS WITH CONTRAST  TECHNIQUE: Multidetector CT imaging of the abdomen and pelvis was performed using the standard protocol following bolus administration of intravenous contrast.  CONTRAST:  50mL OMNIPAQUE IOHEXOL 300 MG/ML SOLN, 15mL OMNIPAQUE IOHEXOL 300 MG/ML SOLN  COMPARISON:  Abdominal ultrasound dated 07/02/2012  FINDINGS: Lower chest:  Unremarkable.  Peritoneum: No free air or free fluid.  Liver: Unremarkable.No intrahepatic biliary ductal dilatation.  Gallbladder: Cholecystectomy.  Pancreas: Unremarkable.No ductal dilatation.  Spleen: Unremarkable.  Adrenals glands: Unremarkable.  Kidneys, ureters, urinary bladder: There is minimal fullness of the right renal collecting system. The left kidney is unremarkable.  Reproductive: Hysterectomy.  Bowel and appendix: Constipation. No bowel obstruction. There is mild distention of the stomach without definite evidence of gastric outlet obstruction. A small pocket of air along the pylorus appears to be intraluminal. There is minimal apparent haziness of the fat surrounding the pylorus and proximal duodenum which may be related to volume averaging artifact. Focal duodenitis is less likely but not excluded. Clinical correlation is recommended. The appendix is not visualized with certainty. No inflammatory changes identified in the right lower quadrant.  Vascular/Lymphatic: Mild atherosclerotic calcification of the aorta.No lymphadenopathy.  Abdominal wall/Musculoskeletal: Small fat containing umbilical hernia. A small supraumbilical peritoneal defect is noted containing  small omental fat. Mild degenerative changes of the spine.  IMPRESSION: Mildly distended stomach without definite evidence of gastric outlet or small bowel obstruction.  Minimal apparent haziness of the fat surrounding the pylorus which may be artifactual or represent mild focal inflammatory changes. Clinical correlation is recommended.   Electronically Signed   By: Anner Crete M.D.   On: 08/17/2014 23:27    CBC  Recent Labs Lab 08/14/14 1014 08/17/14 1835 08/17/14 2330 08/18/14 0800 08/20/14 0353  WBC 7.0 20.0* 17.3* 14.6* 5.1  HGB 15.1* 15.3* 15.1* 15.0 12.1  HCT 43.9 46.1* 46.1* 45.4 35.3*  PLT 271.0 324 279 246 193  MCV 83.9 88.0 89.2 87.6 83.5  MCH  --  29.2 29.2 29.0 28.6  MCHC 34.4 33.2 32.8 33.0 34.3  RDW 12.9 13.2 13.2 13.4 13.5  LYMPHSABS 2.0 2.6  --   --   --   MONOABS 0.3 1.2*  --   --   --   EOSABS 0.1 0.0  --   --   --   BASOSABS 0.0 0.0  --   --   --     Chemistries   Recent Labs Lab 08/14/14 1014 08/17/14 1518  08/18/14 1800 08/18/14 2357 08/19/14 0615 08/20/14 0353 08/20/14 0700  NA 136 137  < > 136 137 137 139 138  K 4.9 5.3*  < > 4.2 3.2* 3.4* 2.8* 2.5*  CL 98 103  < > 113* 113* 112* 111 109  CO2 27 6*  < > 9* 13* 14* 21* 22  GLUCOSE 297* 276*  < > 229* 138* 140* 126* 137*  BUN 13 26*  < > 15 13 11 11 11   CREATININE 0.75 1.24*  < > 0.90 0.83 0.75 0.69 0.67  CALCIUM 9.4 9.3  < > 7.7* 8.3* 8.0* 8.1* 7.9*  AST 21 24  --   --   --   --   --   --   ALT 20 20  --   --   --   --   --   --   ALKPHOS 112 110  --   --   --   --   --   --  BILITOT 0.7 1.5*  --   --   --   --   --   --   < > = values in this interval not displayed. ------------------------------------------------------------------------------------------------------------------ estimated creatinine clearance is 76.8 mL/min (by C-G formula based on Cr of 0.67). ------------------------------------------------------------------------------------------------------------------  Recent  Labs  08/17/14 2330  HGBA1C 14.3*   ------------------------------------------------------------------------------------------------------------------  Recent Labs  08/17/14 2330  CHOL 382*  HDL 48  LDLCALC 269*  TRIG 324*  CHOLHDL 8.0   ------------------------------------------------------------------------------------------------------------------ No results for input(s): TSH, T4TOTAL, T3FREE, THYROIDAB in the last 72 hours.  Invalid input(s): FREET3 ------------------------------------------------------------------------------------------------------------------ No results for input(s): VITAMINB12, FOLATE, FERRITIN, TIBC, IRON, RETICCTPCT in the last 72 hours.  Coagulation profile No results for input(s): INR, PROTIME in the last 168 hours.  No results for input(s): DDIMER in the last 72 hours.  Cardiac Enzymes No results for input(s): CKMB, TROPONINI, MYOGLOBIN in the last 168 hours.  Invalid input(s): CK ------------------------------------------------------------------------------------------------------------------ Invalid input(s): POCBNP    Dawson Hollman D.O. on 08/20/2014 at 11:31 AM  Between 7am to 7pm - Pager - [phone]  After 7pm go to [email or web address] - password [email or web address]  And look for the night coverage person covering for me after hours  [facility] Office  [phone]

## 2014-08-20 NOTE — Progress Notes (Signed)
Page sent to on-call provider concerning patient's K+ of 2.8 this am. Also wanting clarification as to whether or not the patient should be on a regular diet while on the glucostabilizer. Blood sugars continue to fluctuate.

## 2014-08-21 DIAGNOSIS — E440 Moderate protein-calorie malnutrition: Secondary | ICD-10-CM

## 2014-08-21 LAB — BASIC METABOLIC PANEL
Anion gap: 11 (ref 5–15)
BUN: 8 mg/dL (ref 6–20)
CALCIUM: 7.9 mg/dL — AB (ref 8.9–10.3)
CHLORIDE: 104 mmol/L (ref 101–111)
CO2: 24 mmol/L (ref 22–32)
Creatinine, Ser: 0.47 mg/dL (ref 0.44–1.00)
GFR calc Af Amer: >60 mL/min (ref >60)
GFR calc non Af Amer: >60 mL/min (ref >60)
Glucose, Bld: 198 mg/dL — ABNORMAL HIGH (ref 65–99)
Potassium: 2.5 mmol/L — CL (ref 3.5–5.1)
Sodium: 139 mmol/L (ref 135–145)

## 2014-08-21 LAB — CBC
HCT: 35.5 % — ABNORMAL LOW (ref 36.0–46.0)
Hemoglobin: 12.2 g/dL (ref 12.0–15.0)
MCH: 28.4 pg (ref 26.0–34.0)
MCHC: 34.4 g/dL (ref 30.0–36.0)
MCV: 82.6 fL (ref 78.0–100.0)
Platelets: 171 10*3/uL (ref 150–400)
RBC: 4.3 MIL/uL (ref 3.87–5.11)
RDW: 13.3 % (ref 11.5–15.5)
WBC: 6.7 10*3/uL (ref 4.0–10.5)

## 2014-08-21 LAB — GLUCOSE, CAPILLARY
Glucose-Capillary: 208 mg/dL — ABNORMAL HIGH (ref 65–99)
Glucose-Capillary: 175 mg/dL — ABNORMAL HIGH (ref 65–99)
Glucose-Capillary: 170 mg/dL — ABNORMAL HIGH (ref 65–99)
Glucose-Capillary: 188 mg/dL — ABNORMAL HIGH (ref 65–99)

## 2014-08-21 LAB — POTASSIUM: POTASSIUM: 2.9 mmol/L — AB (ref 3.5–5.1)

## 2014-08-21 MED ORDER — LIVING WELL WITH DIABETES BOOK
Freq: Once | Status: AC
  Administered 2014-08-21: 11:00:00
  Filled 2014-08-21: qty 1

## 2014-08-21 MED ORDER — POTASSIUM CHLORIDE 20 MEQ/15ML (10%) PO SOLN
40.0000 meq | Freq: Every day | ORAL | Status: DC
  Administered 2014-08-21 – 2014-08-22 (×2): 40 meq via ORAL
  Filled 2014-08-21 (×2): qty 30

## 2014-08-21 MED ORDER — POTASSIUM CHLORIDE 10 MEQ/100ML IV SOLN: 10.0000 meq | INTRAVENOUS | Status: DC

## 2014-08-21 MED ORDER — PANTOPRAZOLE SODIUM 20 MG PO TBEC: 20.0000 mg | DELAYED_RELEASE_TABLET | Freq: Every day | ORAL | Status: DC

## 2014-08-21 MED ORDER — POTASSIUM CHLORIDE CRYS ER 20 MEQ PO TBCR
40.0000 meq | EXTENDED_RELEASE_TABLET | Freq: Once | ORAL | Status: AC
  Administered 2014-08-21: 40 meq via ORAL
  Filled 2014-08-21: qty 2

## 2014-08-21 MED ORDER — POTASSIUM CHLORIDE CRYS ER 20 MEQ PO TBCR
40.0000 meq | EXTENDED_RELEASE_TABLET | Freq: Two times a day (BID) | ORAL | Status: DC
  Administered 2014-08-21: 40 meq via ORAL

## 2014-08-21 MED ORDER — POTASSIUM CHLORIDE CRYS ER 20 MEQ PO TBCR: 40.0000 meq | EXTENDED_RELEASE_TABLET | Freq: Two times a day (BID) | ORAL | Status: DC

## 2014-08-21 MED ORDER — POTASSIUM CHLORIDE 10 MEQ/100ML IV SOLN
10.0000 meq | INTRAVENOUS | Status: AC
  Administered 2014-08-21 (×4): 10 meq via INTRAVENOUS
  Filled 2014-08-21 (×4): qty 100

## 2014-08-21 MED ORDER — INSULIN STARTER KIT- SYRINGES (ENGLISH)
1.0000 | Freq: Once | Status: AC
  Administered 2014-08-21: 1
  Filled 2014-08-21: qty 1

## 2014-08-21 MED ORDER — BISACODYL 5 MG PO TBEC: 5.0000 mg | DELAYED_RELEASE_TABLET | Freq: Every day | ORAL | Status: DC | PRN

## 2014-08-21 NOTE — Progress Notes (Signed)
[employer] called pt given one more day of authorization, will need clinical in am.

## 2014-08-21 NOTE — Progress Notes (Signed)
CRITICAL VALUE ALERT  Critical value received:  K+ 2.5  Date of notification:  08/21/14  Time of notification:  04:50  Critical value read back:Yes.    Nurse who received alert:  Ruben Im, RN  MD notified (1st page):  K Schorr via text page  Time of first page:  05:10  MD notified (2nd page):  Time of second page:  Responding MD:    Time MD responded:

## 2014-08-21 NOTE — Progress Notes (Signed)
[facility]                                                                              Patient Demographics  Catherine Mora, is a 64 y.o. female, DOB - [birth year], [place] date - 08/17/2014   Admitting Physician Catherine Gee, MD  Outpatient Primary MD for the patient is Catherine Kehr, MD  LOS - 4   Chief Complaint  Patient presents with  . Nausea  . Emesis       HPI on 08/17/2014 by Dr. Devona Mora Catherine Mora is a 64 y.o. female with new diagnosis of Diabetes Mellitus presents with DKA. Patient was having vomiting going on since the weekend. She has no abdominal pain. She has been feeling feverish but has not had a documented fever. She has not been able to keep anything down. She has felt dizzy and has been extremely weak. Patient has no blood in the vomitus. She has no diarrhea noted. She has no blood in the stools noted. Patient was seen in the office today and was advised to come to the ED. In the ED she had labwork showing elevatd glucose with an AG of 28 and also with a CO2 of 6 on presentation. It appears she was diagnosed last week with DM and started on Invokamet.  Assessment & Plan  Anion gap ketoacidosis -Unlikely secondary to DKA as glucose upon admission was 276 -Patient denied any alcohol ingestion or other substance abuse -Likely secondary to invokana -Lactic acid normal -[place] recommended continuing insulin drip with regular diet until bicarbonate is greater than 15, pH greater than 7.3, no further ketosis -Currently, pH 7.377, bicarb 24, beta-hydroxybutyric 0.89 (all improving) -Labs improved, patient was weaned off of glucose stabilizer -Continue Levemir 14 units along with insulin sliding scale -diabetes coordinator consulted and appreciated -Patient will need close follow-up by her primary care physician regarding her diabetes management.  Diabetes mellitus, type 2 -Hemoglobin A1c 14.3 -Treatment and plan as  above  Hypertension -Currently on no home medications -Stable  GERD -Continue PPI  Hypokalemia -Potassium 2.5, will continue to replace and monitor BMP -Magnesium 1.9 -Possibly secondary to insulin drip  Depression -Continue lexapro  Acute kidney injury -Resolved  Code Status: Full  Family Communication: None at bedside  Disposition Plan: Admitted.  Continue to monitor potassium.  Likely dc 7/2.  Time Spent in minutes   30 minutes  Procedures  None  Consults   Diabetes coordinator  DVT Prophylaxis  heparin  Lab Results  Component Value Date   PLT 171 08/21/2014    Medications  Scheduled Meds: . escitalopram  10 mg Oral Daily  . heparin  5,000 Units Subcutaneous 3 times per day  . insulin aspart  0-5 Units Subcutaneous QHS  . insulin aspart  0-9 Units Subcutaneous TID WC  . insulin detemir  14 Units Subcutaneous Daily  . pantoprazole  20 mg Oral Daily  . potassium chloride  40 mEq Oral Daily   Continuous Infusions:   PRN Meds:.acetaminophen, ALPRAZolam, bisacodyl, dextrose, ondansetron (ZOFRAN) IV, promethazine  Antibiotics    Anti-infectives    None      Subjective:   Catherine Mora  Catherine Mora seen and examined today.  States she is feeling better.  Denies chest pain, shortness of breath, headache, dizziness, abdominal pain, nausea, vomiting. States she had constipation yesterday and received enema as well as suppository without relief.  Patient had to be manually disimpacted.    Objective:   Filed Vitals:   08/21/14 0057 08/21/14 0512 08/21/14 1029 08/21/14 1500  BP: 133/84 145/78 148/86 135/85  Pulse: 83 84 79 84  Temp: 98.4 F (36.9 C) 98.6 F (37 C) 98.1 F (36.7 C) 98.1 F (36.7 C)  TempSrc: Oral Oral Oral Oral  Resp: 18 16 18 20   Height:      Weight:      SpO2: 98% 97% 98% 99%    Wt Readings from Last 3 Encounters:  08/17/14 77.1 kg (169 lb 15.6 oz)  08/13/14 78.926 kg (174 lb)  01/13/14 86.637 kg (191 lb)     Intake/Output  Summary (Last 24 hours) at 08/21/14 1552 Last data filed at 08/21/14 0900  Gross per 24 hour  Intake   2340 ml  Output      0 ml  Net   2340 ml    Exam  General: Well developed, well nourished, no distress  HEENT: NCAT,  mucous membranes moist.   Cardiovascular: S1 S2 auscultated, RRR, no murmurs  Respiratory: Clear to auscultation  Abdomen: Soft, nontender, nondistended, + bowel sounds  Extremities: warm dry without cyanosis clubbing or edema  Neuro: AAOx3, nonfocal  Psych: Normal affect and demeanor, pleasant  Data Review   Micro Results Recent Results (from the past 240 hour(s))  Urine culture     Status: None   Collection Time: 08/17/14  8:51 PM  Result Value Ref Range Status   Specimen Description URINE, CLEAN CATCH  Final   Special Requests NONE  Final   Culture   Final    MULTIPLE SPECIES PRESENT, SUGGEST RECOLLECTION IF CLINICALLY INDICATED Performed at [facility]    Report Status 08/19/2014 FINAL  Final  MRSA PCR Screening     Status: None   Collection Time: 08/17/14  9:45 PM  Result Value Ref Range Status   MRSA by PCR NEGATIVE NEGATIVE Final    Comment:        The GeneXpert MRSA Assay (FDA approved for NASAL specimens only), is one component of a comprehensive MRSA colonization surveillance program. It is not intended to diagnose MRSA infection nor to guide or monitor treatment for MRSA infections.   Culture, blood (routine x 2)     Status: None (Preliminary result)   Collection Time: 08/17/14 11:30 PM  Result Value Ref Range Status   Specimen Description BLOOD RIGHT ARM  Final   Special Requests BOTTLES DRAWN AEROBIC AND ANAEROBIC 8CC  Final   Culture   Final    NO GROWTH 3 DAYS Performed at [facility]    Report Status PENDING  Incomplete  Culture, blood (routine x 2)     Status: None (Preliminary result)   Collection Time: 08/17/14 11:35 PM  Result Value Ref Range Status   Specimen Description BLOOD BLOOD RIGHT HAND   Final   Special Requests BOTTLES DRAWN AEROBIC ONLY 5CC  Final   Culture   Final    NO GROWTH 3 DAYS Performed at [facility]    Report Status PENDING  Incomplete    Radiology Reports Ct Abdomen Pelvis W Contrast  08/17/2014   CLINICAL DATA:  64 year old female with nausea and vomiting since yesterday. Patient has medical  history of IBS, hypertension, with recent diagnosis of diabetes.  EXAM: CT ABDOMEN AND PELVIS WITH CONTRAST  TECHNIQUE: Multidetector CT imaging of the abdomen and pelvis was performed using the standard protocol following bolus administration of intravenous contrast.  CONTRAST:  44mL OMNIPAQUE IOHEXOL 300 MG/ML SOLN, 142mL OMNIPAQUE IOHEXOL 300 MG/ML SOLN  COMPARISON:  Abdominal ultrasound dated 07/02/2012  FINDINGS: Lower chest:  Unremarkable.  Peritoneum: No free air or free fluid.  Liver: Unremarkable.No intrahepatic biliary ductal dilatation.  Gallbladder: Cholecystectomy.  Pancreas: Unremarkable.No ductal dilatation.  Spleen: Unremarkable.  Adrenals glands: Unremarkable.  Kidneys, ureters, urinary bladder: There is minimal fullness of the right renal collecting system. The left kidney is unremarkable.  Reproductive: Hysterectomy.  Bowel and appendix: Constipation. No bowel obstruction. There is mild distention of the stomach without definite evidence of gastric outlet obstruction. A small pocket of air along the pylorus appears to be intraluminal. There is minimal apparent haziness of the fat surrounding the pylorus and proximal duodenum which may be related to volume averaging artifact. Focal duodenitis is less likely but not excluded. Clinical correlation is recommended. The appendix is not visualized with certainty. No inflammatory changes identified in the right lower quadrant.  Vascular/Lymphatic: Mild atherosclerotic calcification of the aorta.No lymphadenopathy.  Abdominal wall/Musculoskeletal: Small fat containing umbilical hernia. A small supraumbilical peritoneal  defect is noted containing small omental fat. Mild degenerative changes of the spine.  IMPRESSION: Mildly distended stomach without definite evidence of gastric outlet or small bowel obstruction.  Minimal apparent haziness of the fat surrounding the pylorus which may be artifactual or represent mild focal inflammatory changes. Clinical correlation is recommended.   Electronically Signed   By: Anner Crete M.D.   On: 08/17/2014 23:27    CBC  Recent Labs Lab 08/17/14 1835 08/17/14 2330 08/18/14 0800 08/20/14 0353 08/21/14 0353  WBC 20.0* 17.3* 14.6* 5.1 6.7  HGB 15.3* 15.1* 15.0 12.1 12.2  HCT 46.1* 46.1* 45.4 35.3* 35.5*  PLT 324 279 246 193 171  MCV 88.0 89.2 87.6 83.5 82.6  MCH 29.2 29.2 29.0 28.6 28.4  MCHC 33.2 32.8 33.0 34.3 34.4  RDW 13.2 13.2 13.4 13.5 13.3  LYMPHSABS 2.6  --   --   --   --   MONOABS 1.2*  --   --   --   --   EOSABS 0.0  --   --   --   --   BASOSABS 0.0  --   --   --   --     Chemistries   Recent Labs Lab 08/17/14 1518  08/18/14 2357 08/19/14 0615 08/20/14 0353 08/20/14 0700 08/21/14 0353 08/21/14 1310  NA 137  < > 137 137 139 138 139  --   K 5.3*  < > 3.2* 3.4* 2.8* 2.5* 2.5* 2.9*  CL 103  < > 113* 112* 111 109 104  --   CO2 6*  < > 13* 14* 21* 22 24  --   GLUCOSE 276*  < > 138* 140* 126* 137* 198*  --   BUN 26*  < > 13 11 11 11 8   --   CREATININE 1.24*  < > 0.83 0.75 0.69 0.67 0.47  --   CALCIUM 9.3  < > 8.3* 8.0* 8.1* 7.9* 7.9*  --   MG  --   --   --   --   --  1.9  --   --   AST 24  --   --   --   --   --   --   --  ALT 20  --   --   --   --   --   --   --   ALKPHOS 110  --   --   --   --   --   --   --   BILITOT 1.5*  --   --   --   --   --   --   --   < > = values in this interval not displayed. ------------------------------------------------------------------------------------------------------------------ estimated creatinine clearance is 76.8 mL/min (by C-G formula based on Cr of  0.47). ------------------------------------------------------------------------------------------------------------------ No results for input(s): HGBA1C in the last 72 hours. ------------------------------------------------------------------------------------------------------------------ No results for input(s): CHOL, HDL, LDLCALC, TRIG, CHOLHDL, LDLDIRECT in the last 72 hours. ------------------------------------------------------------------------------------------------------------------ No results for input(s): TSH, T4TOTAL, T3FREE, THYROIDAB in the last 72 hours.  Invalid input(s): FREET3 ------------------------------------------------------------------------------------------------------------------ No results for input(s): VITAMINB12, FOLATE, FERRITIN, TIBC, IRON, RETICCTPCT in the last 72 hours.  Coagulation profile No results for input(s): INR, PROTIME in the last 168 hours.  No results for input(s): DDIMER in the last 72 hours.  Cardiac Enzymes No results for input(s): CKMB, TROPONINI, MYOGLOBIN in the last 168 hours.  Invalid input(s): CK ------------------------------------------------------------------------------------------------------------------ Invalid input(s): POCBNP    Marton Malizia D.O. on 08/21/2014 at 3:52 PM  Between 7am to 7pm - Pager - [phone]  After 7pm go to [email or web address] - password [email or web address]  And look for the night coverage person covering for me after hours  [facility] Office  [phone]

## 2014-08-21 NOTE — Progress Notes (Signed)
Patient complaining of constipation overnight unrelieved by Doculax pill or suppository, no relief from enema.  Patient was impacted and had to be manually disimpacted.  Some relief obtained, will continue to monitor patient.

## 2014-08-21 NOTE — Discharge Summary (Signed)
Physician Discharge Summary  Catherine Mora BSW:[id number] DOB: 09-08-1950 DOA: 08/17/2014  PCP: Catherine Kehr, MD  Admit date: 08/17/2014 Discharge date: 08/22/2014  Time spent: 45 minutes  Recommendations for Outpatient Follow-up:  Patient will be discharged to home.  Patient will need to follow up with primary care provider within one week of discharge and have repeat BMP and discuss diabetes management. Patient should continue medications as prescribed.  Patient should follow a carb modified diet.    Discharge Diagnoses:  Anion gap ketoacidosis Diabetes mellitus, type II Hypertension GERD Hypokalemia Depression Acute kidney injury  Discharge Condition: stable  Diet recommendation: Carb modified  Filed Weights   08/17/14 2145  Weight: 77.1 kg (169 lb 15.6 oz)    History of present illness:  on 08/17/2014 by Dr. Devona Mora Catherine Mora is a 64 y.o. female with new diagnosis of Diabetes Mellitus presents with DKA. Patient was having vomiting going on since the weekend. She has no abdominal pain. She has been feeling feverish but has not had a documented fever. She has not been able to keep anything down. She has felt dizzy and has been extremely weak. Patient has no blood in the vomitus. She has no diarrhea noted. She has no blood in the stools noted. Patient was seen in the office today and was advised to come to the ED. In the ED she had labwork showing elevatd glucose with an AG of 28 and also with a CO2 of 6 on presentation. It appears she was diagnosed last week with DM and started on Invokamet.  Hospital Course:  Anion gap ketoacidosis -Unlikely secondary to DKA as glucose upon admission was 276 -Patient denied any alcohol ingestion or other substance abuse -Likely secondary to invokana -Lactic acid normal -[place] recommended continuing insulin drip with regular diet until bicarbonate is greater than 15, pH greater than 7.3, no further  ketosis -Currently, pH 7.377, bicarb 24, beta-hydroxybutyric 0.89 (all improving) -Labs improved, patient was weaned off of glucose stabilizer -was placed on Levemir 14 units along with insulin sliding scale -diabetes coordinator consulted and appreciated -Patient will need close follow-up by her primary care physician regarding her diabetes management.  Diabetes mellitus, type 2 -Hemoglobin A1c 14.3 -Treatment and plan as above -Spoke with Diabetes coordinator, will discharge patient with levemir 14u daily and metformin 500mg  BID  Hypertension -Currently on no home medications -Stable  GERD -Continue PPI  Hypokalemia -Potassium 4.4, Patient should have repeat BMP in one week. -Magnesium 1.9 -Secondary to insulin drip  Depression -Continue lexapro  Acute kidney injury -Resolved  Procedures  None  Consults  Diabetes coordinator  Discharge Exam: Filed Vitals:   08/22/14 1345  BP: 133/87  Pulse: 69  Temp: 98.1 F (36.7 C)  Resp: 16   Exam  General: Well developed, well nourished, no distress  HEENT: NCAT, mucous membranes moist.   Cardiovascular: S1 S2 auscultated, RRR, no murmurs  Respiratory: Clear to auscultation  Abdomen: Soft, nontender, nondistended, + bowel sounds  Extremities: warm dry without cyanosis clubbing or edema  Neuro: AAOx3, nonfocal  Psych: Normal affect and demeanor, pleasant  Discharge Instructions      Discharge Instructions    Ambulatory referral to Nutrition and Diabetic Education    Complete by:  As directed             Medication List    STOP taking these medications        Canagliflozin-Metformin HCl 150-500 MG Tabs  Commonly known as:  INVOKAMET  TAKE these medications        ALPRAZolam 1 MG tablet  Commonly known as:  XANAX  Take 0.5-1 tablets (0.5-1 mg total) by mouth 2 (two) times daily as needed for sleep or anxiety.     bisacodyl 5 MG EC tablet  Commonly known as:  DULCOLAX  Take 1  tablet (5 mg total) by mouth daily as needed for moderate constipation.     cholecalciferol 1000 UNITS tablet  Commonly known as:  VITAMIN D  Take 1 tablet (1,000 Units total) by mouth daily.     escitalopram 10 MG tablet  Commonly known as:  LEXAPRO  Take 1 tablet (10 mg total) by mouth daily.     ESTRACE VAGINAL 0.1 MG/GM vaginal cream  Generic drug:  estradiol  Place 1 Applicatorful vaginally once a week.     fluconazole 200 MG tablet  Commonly known as:  DIFLUCAN  Take 200 mg by mouth every 30 (thirty) days.     ibuprofen 200 MG tablet  Commonly known as:  ADVIL,MOTRIN  Take 400 mg by mouth every 6 (six) hours as needed for headache.     Insulin Detemir 100 UNIT/ML Pen  Commonly known as:  LEVEMIR FLEXPEN  Inject 14 Units into the skin daily at 10 pm.     Insulin Pen Needle 31G X 5 MM Misc  Use with Levemir pen     metFORMIN 500 MG tablet  Commonly known as:  GLUCOPHAGE  Take 1 tablet (500 mg total) by mouth 2 (two) times daily with a meal.     pantoprazole 20 MG tablet  Commonly known as:  PROTONIX  Take 1 tablet (20 mg total) by mouth daily.       Allergies  Allergen Reactions  . Ciprofloxacin     REACTION: itching  . Crestor [Rosuvastatin Calcium]     arthralgia  . Hydrochlorothiazide     REACTION: hair loss  . Invokamet [Canagliflozin-Metformin Hcl] Other (See Comments)    ketoacidosis  . Paroxetine     REACTION: living in glass  . Penicillins Hives  . Simvastatin     REACTION: aches with statins   Follow-up Information    Follow up with Catherine Kehr, MD. Schedule an appointment as soon as possible for a visit in 1 week.   Specialty:  Internal Medicine   Why:  Hospital follow up, diabetes management, repeat BMP   Contact information:   [address] [place] [address] [phone]        The results of significant diagnostics from this hospitalization (including imaging, microbiology, ancillary and laboratory) are listed below for  reference.    Significant Diagnostic Studies: Ct Abdomen Pelvis W Contrast  08/17/2014   CLINICAL DATA:  64 year old female with nausea and vomiting since yesterday. Patient has medical history of IBS, hypertension, with recent diagnosis of diabetes.  EXAM: CT ABDOMEN AND PELVIS WITH CONTRAST  TECHNIQUE: Multidetector CT imaging of the abdomen and pelvis was performed using the standard protocol following bolus administration of intravenous contrast.  CONTRAST:  47mL OMNIPAQUE IOHEXOL 300 MG/ML SOLN, 113mL OMNIPAQUE IOHEXOL 300 MG/ML SOLN  COMPARISON:  Abdominal ultrasound dated 07/02/2012  FINDINGS: Lower chest:  Unremarkable.  Peritoneum: No free air or free fluid.  Liver: Unremarkable.No intrahepatic biliary ductal dilatation.  Gallbladder: Cholecystectomy.  Pancreas: Unremarkable.No ductal dilatation.  Spleen: Unremarkable.  Adrenals glands: Unremarkable.  Kidneys, ureters, urinary bladder: There is minimal fullness of the right renal collecting system. The left kidney is unremarkable.  Reproductive: Hysterectomy.  Bowel and appendix: Constipation. No bowel obstruction. There is mild distention of the stomach without definite evidence of gastric outlet obstruction. A small pocket of air along the pylorus appears to be intraluminal. There is minimal apparent haziness of the fat surrounding the pylorus and proximal duodenum which may be related to volume averaging artifact. Focal duodenitis is less likely but not excluded. Clinical correlation is recommended. The appendix is not visualized with certainty. No inflammatory changes identified in the right lower quadrant.  Vascular/Lymphatic: Mild atherosclerotic calcification of the aorta.No lymphadenopathy.  Abdominal wall/Musculoskeletal: Small fat containing umbilical hernia. A small supraumbilical peritoneal defect is noted containing small omental fat. Mild degenerative changes of the spine.  IMPRESSION: Mildly distended stomach without definite evidence of  gastric outlet or small bowel obstruction.  Minimal apparent haziness of the fat surrounding the pylorus which may be artifactual or represent mild focal inflammatory changes. Clinical correlation is recommended.   Electronically Signed   By: Anner Crete M.D.   On: 08/17/2014 23:27    Microbiology: Recent Results (from the past 240 hour(s))  Urine culture     Status: None   Collection Time: 08/17/14  8:51 PM  Result Value Ref Range Status   Specimen Description URINE, CLEAN CATCH  Final   Special Requests NONE  Final   Culture   Final    MULTIPLE SPECIES PRESENT, SUGGEST RECOLLECTION IF CLINICALLY INDICATED Performed at [facility]    Report Status 08/19/2014 FINAL  Final  MRSA PCR Screening     Status: None   Collection Time: 08/17/14  9:45 PM  Result Value Ref Range Status   MRSA by PCR NEGATIVE NEGATIVE Final    Comment:        The GeneXpert MRSA Assay (FDA approved for NASAL specimens only), is one component of a comprehensive MRSA colonization surveillance program. It is not intended to diagnose MRSA infection nor to guide or monitor treatment for MRSA infections.   Culture, blood (routine x 2)     Status: None (Preliminary result)   Collection Time: 08/17/14 11:30 PM  Result Value Ref Range Status   Specimen Description BLOOD RIGHT ARM  Final   Special Requests BOTTLES DRAWN AEROBIC AND ANAEROBIC 8CC  Final   Culture   Final    NO GROWTH 4 DAYS Performed at [facility]    Report Status PENDING  Incomplete  Culture, blood (routine x 2)     Status: None (Preliminary result)   Collection Time: 08/17/14 11:35 PM  Result Value Ref Range Status   Specimen Description BLOOD BLOOD RIGHT HAND  Final   Special Requests BOTTLES DRAWN AEROBIC ONLY 5CC  Final   Culture   Final    NO GROWTH 4 DAYS Performed at [facility]    Report Status PENDING  Incomplete     Labs: Basic Metabolic Panel:  Recent Labs Lab 08/17/14 2330  08/19/14 0615  08/20/14 0353 08/20/14 0700 08/21/14 0353 08/21/14 1310 08/22/14 0430 08/22/14 1412  NA 137  < > 137 139 138 139  --  142  --   K 4.6  < > 3.4* 2.8* 2.5* 2.5* 2.9* 2.9* 4.4  CL 111  < > 112* 111 109 104  --  101  --   CO2 6*  < > 14* 21* 22 24  --  31  --   GLUCOSE 179*  < > 140* 126* 137* 198*  --  157*  --   BUN 22*  < >  11 11 11 8   --  9  --   CREATININE 1.11*  < > 0.75 0.69 0.67 0.47  --  0.49  --   CALCIUM 7.8*  < > 8.0* 8.1* 7.9* 7.9*  --  8.3*  --   MG  --   --   --   --  1.9  --   --   --   --   PHOS 2.9  --   --   --   --   --   --   --   --   < > = values in this interval not displayed. Liver Function Tests:  Recent Labs Lab 08/17/14 1518  AST 24  ALT 20  ALKPHOS 110  BILITOT 1.5*  PROT 8.7*  ALBUMIN 4.5    Recent Labs Lab 08/17/14 1518  LIPASE 16*   No results for input(s): AMMONIA in the last 168 hours. CBC:  Recent Labs Lab 08/17/14 1835 08/17/14 2330 08/18/14 0800 08/20/14 0353 08/21/14 0353  WBC 20.0* 17.3* 14.6* 5.1 6.7  NEUTROABS 16.2*  --   --   --   --   HGB 15.3* 15.1* 15.0 12.1 12.2  HCT 46.1* 46.1* 45.4 35.3* 35.5*  MCV 88.0 89.2 87.6 83.5 82.6  PLT 324 279 246 193 171   Cardiac Enzymes: No results for input(s): CKTOTAL, CKMB, CKMBINDEX, TROPONINI in the last 168 hours. BNP: BNP (last 3 results) No results for input(s): BNP in the last 8760 hours.  ProBNP (last 3 results) No results for input(s): PROBNP in the last 8760 hours.  CBG:  Recent Labs Lab 08/21/14 1132 08/21/14 1642 08/21/14 2131 08/22/14 0749 08/22/14 1155  GLUCAP 175* 170* 188* 130* 198*       Signed:  Isahia Hollerbach  [facility] 08/22/2014, 3:08 PM

## 2014-08-21 NOTE — Progress Notes (Signed)
Pt has watched all diabetes educational videos.

## 2014-08-22 LAB — NA AND K (SODIUM & POTASSIUM), RAND UR
POTASSIUM UR: 9 mmol/L
SODIUM UR: 73 mmol/L

## 2014-08-22 LAB — GLUCOSE, CAPILLARY
GLUCOSE-CAPILLARY: 130 mg/dL — AB (ref 65–99)
Glucose-Capillary: 198 mg/dL — ABNORMAL HIGH (ref 65–99)

## 2014-08-22 LAB — POTASSIUM: Potassium: 4.4 mmol/L (ref 3.5–5.1)

## 2014-08-22 LAB — BASIC METABOLIC PANEL
Anion gap: 10 (ref 5–15)
BUN: 9 mg/dL (ref 6–20)
CALCIUM: 8.3 mg/dL — AB (ref 8.9–10.3)
CO2: 31 mmol/L (ref 22–32)
Chloride: 101 mmol/L (ref 101–111)
Creatinine, Ser: 0.49 mg/dL (ref 0.44–1.00)
GFR calc Af Amer: >60 mL/min (ref >60)
GFR calc non Af Amer: >60 mL/min (ref >60)
GLUCOSE: 157 mg/dL — AB (ref 65–99)
Potassium: 2.9 mmol/L — ABNORMAL LOW (ref 3.5–5.1)
Sodium: 142 mmol/L (ref 135–145)

## 2014-08-22 MED ORDER — POTASSIUM CHLORIDE 10 MEQ/100ML IV SOLN
10.0000 meq | INTRAVENOUS | Status: AC
  Administered 2014-08-22 (×4): 10 meq via INTRAVENOUS
  Filled 2014-08-22 (×4): qty 100

## 2014-08-22 MED ORDER — INSULIN DETEMIR 100 UNIT/ML FLEXPEN: 14.0000 [IU] | PEN_INJECTOR | Freq: Every day | SUBCUTANEOUS | Status: DC

## 2014-08-22 MED ORDER — METFORMIN HCL 500 MG PO TABS: 500.0000 mg | ORAL_TABLET | Freq: Two times a day (BID) | ORAL | Status: DC

## 2014-08-22 MED ORDER — POTASSIUM CHLORIDE CRYS ER 20 MEQ PO TBCR
40.0000 meq | EXTENDED_RELEASE_TABLET | ORAL | Status: AC
  Administered 2014-08-22 (×2): 40 meq via ORAL
  Filled 2014-08-22 (×2): qty 2

## 2014-08-22 MED ORDER — INSULIN PEN NEEDLE 31G X 5 MM MISC: Status: AC

## 2014-08-22 MED ORDER — MAGNESIUM LACTATE 84 MG (7MEQ) PO TBCR: 84.0000 mg | EXTENDED_RELEASE_TABLET | Freq: Two times a day (BID) | ORAL | Status: DC

## 2014-08-22 MED ORDER — MAGNESIUM CHLORIDE 64 MG PO TBEC
1.0000 | DELAYED_RELEASE_TABLET | Freq: Two times a day (BID) | ORAL | Status: DC
  Administered 2014-08-22: 64 mg via ORAL
  Filled 2014-08-22 (×2): qty 1

## 2014-08-22 NOTE — Discharge Instructions (Signed)

## 2014-08-22 NOTE — Progress Notes (Signed)
Reviewed discharge information with patient and caregiver. Answered all questions. Patient/caregiver able to teach back medications and reasons to contact MD/911. Patient verbalizes importance of PCP follow up appointment.  Nadir Vasques M. Dewight Catino, RN  

## 2014-08-23 LAB — CULTURE, BLOOD (ROUTINE X 2)
Culture: NO GROWTH
Culture: NO GROWTH

## 2014-08-25 ENCOUNTER — Telehealth: Payer: Self-pay | Admitting: Internal Medicine

## 2014-08-25 NOTE — Telephone Encounter (Signed)
I see no phone record of me accepting a transfer or this being approved by PCP.  She needs to schedule ER f/u w/ current provider and go through appropriate channels to transfer

## 2014-08-25 NOTE — Telephone Encounter (Signed)
Pt called back. I advised her to follow up with Dr. Alain Marion right now. Would Dr. Alain Marion and Dr. Birdie Riddle agree to transition care for Dr. Birdie Riddle to be PCP moving forward? Who do we need to send a msg to?

## 2014-08-25 NOTE — Telephone Encounter (Signed)
Please advise, you have neve seen pt?

## 2014-08-25 NOTE — Telephone Encounter (Signed)
Pt would need to discuss with PCP and then Dr. Birdie Riddle would need to sign off also.

## 2014-08-25 NOTE — Telephone Encounter (Signed)
Pt needs to follow up with current PCP per [place]. Then both providers have to approve a transfer in phone note., before any appointments can be made. This is [employer] for transferring PCP's.

## 2014-08-25 NOTE — Telephone Encounter (Signed)
Relation to pt: self  Call back number: [phone]   Reason for call:   Pt requesting to transfer from Dr. Alain Marion to Dr. Birdie Riddle.  Pt also stated she would like to schedule her ED follow up with MD. Please advise

## 2014-08-25 NOTE — Telephone Encounter (Signed)
FYI

## 2014-08-26 NOTE — Telephone Encounter (Signed)
Pt scheduled for ER follow up with Dr. Alain Marion 08/27/14 as was advised. Pt is wanting to transition care to Dr. Birdie Riddle.  Please advise if ok with transition of care.

## 2014-08-26 NOTE — Telephone Encounter (Signed)
[place] w/ me but pt needs to be aware that first available my not be for awhile

## 2014-08-27 ENCOUNTER — Ambulatory Visit (INDEPENDENT_AMBULATORY_CARE_PROVIDER_SITE_OTHER): Payer: 59 | Admitting: Internal Medicine

## 2014-08-27 ENCOUNTER — Encounter: Payer: Self-pay | Admitting: Internal Medicine

## 2014-08-27 ENCOUNTER — Other Ambulatory Visit (INDEPENDENT_AMBULATORY_CARE_PROVIDER_SITE_OTHER): Payer: 59

## 2014-08-27 VITALS — BP 140/90 | HR 72 | Wt 167.0 lb

## 2014-08-27 DIAGNOSIS — I10 Essential (primary) hypertension: Secondary | ICD-10-CM

## 2014-08-27 DIAGNOSIS — E1165 Type 2 diabetes mellitus with hyperglycemia: Secondary | ICD-10-CM

## 2014-08-27 DIAGNOSIS — IMO0002 Reserved for concepts with insufficient information to code with codable children: Secondary | ICD-10-CM

## 2014-08-27 DIAGNOSIS — E1310 Other specified diabetes mellitus with ketoacidosis without coma: Secondary | ICD-10-CM

## 2014-08-27 DIAGNOSIS — E1110 Type 2 diabetes mellitus with ketoacidosis without coma: Secondary | ICD-10-CM

## 2014-08-27 LAB — BASIC METABOLIC PANEL
BUN: 11 mg/dL (ref 6–23)
CHLORIDE: 101 meq/L (ref 96–112)
CO2: 28 meq/L (ref 19–32)
Calcium: 9.5 mg/dL (ref 8.4–10.5)
Creatinine, Ser: 0.67 mg/dL (ref 0.40–1.20)
GFR: 94.07 mL/min (ref >60.00)
Glucose, Bld: 136 mg/dL — ABNORMAL HIGH (ref 70–99)
POTASSIUM: 4.8 meq/L (ref 3.5–5.1)
Sodium: 137 mEq/L (ref 135–145)

## 2014-08-27 LAB — MAGNESIUM: Magnesium: 1.9 mg/dL (ref 1.5–2.5)

## 2014-08-27 MED ORDER — INSULIN DETEMIR 100 UNIT/ML FLEXPEN: 16.0000 [IU] | PEN_INJECTOR | Freq: Every day | SUBCUTANEOUS | Status: DC

## 2014-08-27 MED ORDER — METFORMIN HCL 500 MG PO TABS: 500.0000 mg | ORAL_TABLET | Freq: Two times a day (BID) | ORAL | Status: DC

## 2014-08-27 NOTE — Assessment & Plan Note (Signed)
on NAS diet

## 2014-08-27 NOTE — Assessment & Plan Note (Addendum)
S/p DKA - ?Invokana related 7/16 - on Levimir and Metformin. Change Levimir to am. Endocr ref

## 2014-08-27 NOTE — Assessment & Plan Note (Signed)
6/16 ?related to [place] - resolved Labs

## 2014-08-27 NOTE — Progress Notes (Signed)
Pre visit review using our clinic review tool, if applicable. No additional management support is needed unless otherwise documented below in the visit note. 

## 2014-08-27 NOTE — Progress Notes (Signed)
Subjective:  Patient ID: Catherine Mora, female    DOB: May 22, 1950  Age: 64 y.o. MRN: [id number]  CC: No chief complaint on file.   HPI:  Catherine Mora presents for post-hosp f/u:   Admit date: 08/17/2014 Discharge date: 08/22/2014    Recommendations for Outpatient Follow-up:  Patient will be discharged to home. Patient will need to follow up with primary care provider within one week of discharge and have repeat BMP and discuss diabetes management. Patient should continue medications as prescribed. Patient should follow a carb modified diet.   Discharge Diagnoses:  Anion gap ketoacidosis Diabetes mellitus, type II Hypertension GERD Hypokalemia Depression Acute kidney injury  Discharge Condition: stable  Diet recommendation: Carb modified  Filed Weights   08/17/14 2145  Weight: 77.1 kg (169 lb 15.6 oz)    History of present illness:  on 08/17/2014 by Dr. Devona Konig Catherine Mora is a 64 y.o. female with new diagnosis of Diabetes Mellitus presents with DKA. Patient was having vomiting going on since the weekend. She has no abdominal pain. She has been feeling feverish but has not had a documented fever. She has not been able to keep anything down. She has felt dizzy and has been extremely weak. Patient has no blood in the vomitus. She has no diarrhea noted. She has no blood in the stools noted. Patient was seen in the office today and was advised to come to the ED. In the ED she had labwork showing elevatd glucose with an AG of 28 and also with a CO2 of 6 on presentation. It appears she was diagnosed last week with DM and started on Invokamet.  Hospital Course:  Anion gap ketoacidosis -Unlikely secondary to DKA as glucose upon admission was 276 -Patient denied any alcohol ingestion or other substance abuse -Likely secondary to invokana -Lactic acid normal -[place] recommended continuing insulin drip with regular diet until bicarbonate is  greater than 15, pH greater than 7.3, no further ketosis -Currently, pH 7.377, bicarb 24, beta-hydroxybutyric 0.89 (all improving) -Labs improved, patient was weaned off of glucose stabilizer -was placed on Levemir 14 units along with insulin sliding scale -diabetes coordinator consulted and appreciated -Patient will need close follow-up by her primary care physician regarding her diabetes management.  Diabetes mellitus, type 2 -Hemoglobin A1c 14.3 -Treatment and plan as above -Spoke with Diabetes coordinator, will discharge patient with levemir 14u daily and metformin 500mg  BID  Hypertension -Currently on no home medications -Stable  GERD -Continue PPI  Hypokalemia -Potassium 4.4, Patient should have repeat BMP in one week. -Magnesium 1.9 -Secondary to insulin drip  Depression -Continue lexapro  Acute kidney injury -Resolved  Procedures  None  Consults  Diabetes coordinator  Discharge Exam: Filed Vitals:   08/22/14 1345  BP: 133/87  Pulse: 69  Temp: 98.1 F (36.7 C)  Resp: 16   Exam  General: Well developed, well nourished, no distress  HEENT: NCAT, mucous membranes moist.   Cardiovascular: S1 S2 auscultated, RRR, no murmurs  Respiratory: Clear to auscultation  Abdomen: Soft, nontender, nondistended, + bowel sounds  Extremities: warm dry without cyanosis clubbing or edema  Neuro: AAOx3, nonfocal  Psych: Normal affect and demeanor, pleasant  Discharge Instructions      Discharge Instructions    Ambulatory referral to Nutrition and Diabetic Education  Complete by: As directed             Medication List    STOP taking these medications  Canagliflozin-Metformin HCl 150-500 MG Tabs  Commonly known as: INVOKAMET      TAKE these medications       ALPRAZolam 1 MG tablet  Commonly known as: XANAX  Take 0.5-1 tablets (0.5-1 mg total) by mouth 2 (two) times daily as needed for  sleep or anxiety.     bisacodyl 5 MG EC tablet  Commonly known as: DULCOLAX  Take 1 tablet (5 mg total) by mouth daily as needed for moderate constipation.     cholecalciferol 1000 UNITS tablet  Commonly known as: VITAMIN D  Take 1 tablet (1,000 Units total) by mouth daily.     escitalopram 10 MG tablet  Commonly known as: LEXAPRO  Take 1 tablet (10 mg total) by mouth daily.     ESTRACE VAGINAL 0.1 MG/GM vaginal cream  Generic drug: estradiol  Place 1 Applicatorful vaginally once a week.     fluconazole 200 MG tablet  Commonly known as: DIFLUCAN  Take 200 mg by mouth every 30 (thirty) days.     ibuprofen 200 MG tablet  Commonly known as: ADVIL,MOTRIN  Take 400 mg by mouth every 6 (six) hours as needed for headache.     Insulin Detemir 100 UNIT/ML Pen  Commonly known as: LEVEMIR FLEXPEN  Inject 14 Units into the skin daily at 10 pm.     Insulin Pen Needle 31G X 5 MM Misc  Use with Levemir pen     metFORMIN 500 MG tablet  Commonly known as: GLUCOPHAGE  Take 1 tablet (500 mg total) by mouth 2 (two) times daily with a meal.     pantoprazole 20 MG tablet  Commonly known as: PROTONIX  Take 1 tablet (20 mg total) by mouth daily.       Allergies  Allergen Reactions  . Ciprofloxacin     REACTION: itching  . Crestor [Rosuvastatin Calcium]     arthralgia  . Hydrochlorothiazide     REACTION: hair loss  . Invokamet [Canagliflozin-Metformin Hcl] Other (See Comments)    ketoacidosis  . Paroxetine     REACTION: living in glass  . Penicillins Hives  . Simvastatin     REACTION: aches with statins   Follow-up Information    Follow up with Walker Kehr, MD. Schedule an appointment as soon as possible for a visit in 1 week.   Specialty: Internal Medicine   Why: Hospital follow up, diabetes management, repeat BMP   Contact information:   [place] [address] [phone]         The results of significant diagnostics from this hospitalization (including imaging, microbiology, ancillary and laboratory) are listed below for reference.    Significant Diagnostic Studies:  Imaging Results    Ct Abdomen Pelvis W Contrast  08/17/2014 CLINICAL DATA: 64 year old female with nausea and vomiting since yesterday. Patient has medical history of IBS, hypertension, with recent diagnosis of diabetes. EXAM: CT ABDOMEN AND PELVIS WITH CONTRAST TECHNIQUE: Multidetector CT imaging of the abdomen and pelvis was performed using the standard protocol following bolus administration of intravenous contrast. CONTRAST: 47mL OMNIPAQUE IOHEXOL 300 MG/ML SOLN, 161mL OMNIPAQUE IOHEXOL 300 MG/ML SOLN COMPARISON: Abdominal ultrasound dated 07/02/2012 FINDINGS: Lower chest: Unremarkable. Peritoneum: No free air or free fluid. Liver: Unremarkable.No intrahepatic biliary ductal dilatation. Gallbladder: Cholecystectomy. Pancreas: Unremarkable.No ductal dilatation. Spleen: Unremarkable. Adrenals glands: Unremarkable. Kidneys, ureters, urinary bladder: There is minimal fullness of the right renal collecting system. The left kidney is unremarkable. Reproductive: Hysterectomy. Bowel and appendix: Constipation. No bowel obstruction. There  is mild distention of the stomach without definite evidence of gastric outlet obstruction. A small pocket of air along the pylorus appears to be intraluminal. There is minimal apparent haziness of the fat surrounding the pylorus and proximal duodenum which may be related to volume averaging artifact. Focal duodenitis is less likely but not excluded. Clinical correlation is recommended. The appendix is not visualized with certainty. No inflammatory changes identified in the right lower quadrant. Vascular/Lymphatic: Mild atherosclerotic calcification of the aorta.No lymphadenopathy. Abdominal wall/Musculoskeletal:  Small fat containing umbilical hernia. A small supraumbilical peritoneal defect is noted containing small omental fat. Mild degenerative changes of the spine. IMPRESSION: Mildly distended stomach without definite evidence of gastric outlet or small bowel obstruction. Minimal apparent haziness of the fat surrounding the pylorus which may be artifactual or represent mild focal inflammatory changes. Clinical correlation is recommended. Electronically Signed By: Anner Crete M.D. On: 08/17/2014 23:27     Microbiology: Recent Results (from the past 240 hour(s))  Urine culture Status: None   Collection Time: 08/17/14 8:51 PM  Result Value Ref Range Status   Specimen Description URINE, CLEAN CATCH  Final   Special Requests NONE  Final   Culture   Final    MULTIPLE SPECIES PRESENT, SUGGEST RECOLLECTION IF CLINICALLY INDICATED Performed at [facility]    Report Status 08/19/2014 FINAL  Final  MRSA PCR Screening Status: None   Collection Time: 08/17/14 9:45 PM  Result Value Ref Range Status   MRSA by PCR NEGATIVE NEGATIVE Final    Comment:   The GeneXpert MRSA Assay (FDA approved for NASAL specimens only), is one component of a comprehensive MRSA colonization surveillance program. It is not intended to diagnose MRSA infection nor to guide or monitor treatment for MRSA infections.   Culture, blood (routine x 2) Status: None (Preliminary result)   Collection Time: 08/17/14 11:30 PM  Result Value Ref Range Status   Specimen Description BLOOD RIGHT ARM  Final   Special Requests BOTTLES DRAWN AEROBIC AND ANAEROBIC 8CC  Final   Culture   Final    NO GROWTH 4 DAYS Performed at [facility]    Report Status PENDING  Incomplete  Culture, blood (routine x 2) Status: None (Preliminary result)   Collection Time: 08/17/14 11:35 PM  Result Value Ref Range Status    Specimen Description BLOOD BLOOD RIGHT HAND  Final   Special Requests BOTTLES DRAWN AEROBIC ONLY 5CC  Final   Culture   Final    NO GROWTH 4 DAYS Performed at [facility]    Report Status PENDING  Incomplete     Labs: Basic Metabolic Panel:  Last Labs      Recent Labs Lab 08/17/14 2330  08/19/14 0615 08/20/14 0353 08/20/14 0700 08/21/14 0353 08/21/14 1310 08/22/14 0430 08/22/14 1412  NA 137 < > 137 139 138 139 --  142 --   K 4.6 < > 3.4* 2.8* 2.5* 2.5* 2.9* 2.9* 4.4  CL 111 < > 112* 111 109 104 --  101 --   CO2 6* < > 14* 21* 22 24 --  31 --   GLUCOSE 179* < > 140* 126* 137* 198* --  157* --   BUN 22* < > 11 11 11 8  --  9 --   CREATININE 1.11* < > 0.75 0.69 0.67 0.47 --  0.49 --   CALCIUM 7.8* < > 8.0* 8.1* 7.9* 7.9* --  8.3* --   MG --  --  --  --  1.9 --  --  --  --  PHOS 2.9 --  --  --  --  --  --  --  --   < > = values in this interval not displayed.   Liver Function Tests:  Last Labs      Recent Labs Lab 08/17/14 1518  AST 24  ALT 20  ALKPHOS 110  BILITOT 1.5*  PROT 8.7*  ALBUMIN 4.5      Last Labs      Recent Labs Lab 08/17/14 1518  LIPASE 16*      Last Labs     No results for input(s): AMMONIA in the last 168 hours.   CBC:  Last Labs      Recent Labs Lab 08/17/14 1835 08/17/14 2330 08/18/14 0800 08/20/14 0353 08/21/14 0353  WBC 20.0* 17.3* 14.6* 5.1 6.7  NEUTROABS 16.2* --  --  --  --   HGB 15.3* 15.1* 15.0 12.1 12.2  HCT 46.1* 46.1* 45.4 35.3* 35.5*  MCV 88.0 89.2 87.6 83.5 82.6  PLT 324 279 246 193 171     Cardiac Enzymes:  Last Labs     No results for input(s): CKTOTAL, CKMB, CKMBINDEX, TROPONINI in the last 168 hours.   BNP: BNP (last 3 results)  Recent Labs (within last 365  days)    No results for input(s): BNP in the last 8760 hours.    ProBNP (last 3 results)  Recent Labs (within last 365 days)    No results for input(s): PROBNP in the last 8760 hours.    CBG:  Last Labs      Recent Labs Lab 08/21/14 1132 08/21/14 1642 08/21/14 2131 08/22/14 0749 08/22/14 1155  GLUCAP 175* 170* 188* 130* 198*         Signed:  MIKHAIL, [name] [facility]       Outpatient Prescriptions Prior to Visit  Medication Sig Dispense Refill  . ALPRAZolam (XANAX) 1 MG tablet Take 0.5-1 tablets (0.5-1 mg total) by mouth 2 (two) times daily as needed for sleep or anxiety. 60 tablet 3  . bisacodyl (DULCOLAX) 5 MG EC tablet Take 1 tablet (5 mg total) by mouth daily as needed for moderate constipation. 30 tablet 0  . cholecalciferol (VITAMIN D) 1000 UNITS tablet Take 1 tablet (1,000 Units total) by mouth daily. 100 tablet 3  . escitalopram (LEXAPRO) 10 MG tablet Take 1 tablet (10 mg total) by mouth daily. 90 tablet 2  . ESTRACE VAGINAL 0.1 MG/GM vaginal cream Place 1 Applicatorful vaginally once a week.     . fluconazole (DIFLUCAN) 200 MG tablet Take 200 mg by mouth every 30 (thirty) days.     Marland Kitchen ibuprofen (ADVIL,MOTRIN) 200 MG tablet Take 400 mg by mouth every 6 (six) hours as needed for headache.    . Insulin Detemir (LEVEMIR FLEXPEN) 100 UNIT/ML Pen Inject 14 Units into the skin daily at 10 pm. 15 mL 2  . Insulin Pen Needle 31G X 5 MM MISC Use with Levemir pen 100 each 0  . metFORMIN (GLUCOPHAGE) 500 MG tablet Take 1 tablet (500 mg total) by mouth 2 (two) times daily with a meal. 60 tablet 0  . pantoprazole (PROTONIX) 20 MG tablet Take 1 tablet (20 mg total) by mouth daily. 30 tablet 0   No facility-administered medications prior to visit.    ROS Review of Systems  Objective:  BP 140/90 mmHg  Pulse 72  Wt 167 lb (75.751 kg)  SpO2 98%  BP Readings from Last 3 Encounters:  08/27/14 140/90  08/22/14 133/87  08/17/14  120/82    Wt  Readings from Last 3 Encounters:  08/27/14 167 lb (75.751 kg)  08/17/14 169 lb 15.6 oz (77.1 kg)  08/13/14 174 lb (78.926 kg)    Physical Exam  Lab Results  Component Value Date   WBC 6.7 08/21/2014   HGB 12.2 08/21/2014   HCT 35.5* 08/21/2014   PLT 171 08/21/2014   GLUCOSE 157* 08/22/2014   CHOL 382* 08/17/2014   TRIG 324* 08/17/2014   HDL 48 08/17/2014   LDLDIRECT 219.0 08/14/2014   LDLCALC 269* 08/17/2014   ALT 20 08/17/2014   AST 24 08/17/2014   NA 142 08/22/2014   K 4.4 08/22/2014   CL 101 08/22/2014   CREATININE 0.49 08/22/2014   BUN 9 08/22/2014   CO2 31 08/22/2014   TSH 1.51 08/14/2014   HGBA1C 14.3* 08/17/2014    Ct Abdomen Pelvis W Contrast  08/17/2014   CLINICAL DATA:  64 year old female with nausea and vomiting since yesterday. Patient has medical history of IBS, hypertension, with recent diagnosis of diabetes.  EXAM: CT ABDOMEN AND PELVIS WITH CONTRAST  TECHNIQUE: Multidetector CT imaging of the abdomen and pelvis was performed using the standard protocol following bolus administration of intravenous contrast.  CONTRAST:  65mL OMNIPAQUE IOHEXOL 300 MG/ML SOLN, 141mL OMNIPAQUE IOHEXOL 300 MG/ML SOLN  COMPARISON:  Abdominal ultrasound dated 07/02/2012  FINDINGS: Lower chest:  Unremarkable.  Peritoneum: No free air or free fluid.  Liver: Unremarkable.No intrahepatic biliary ductal dilatation.  Gallbladder: Cholecystectomy.  Pancreas: Unremarkable.No ductal dilatation.  Spleen: Unremarkable.  Adrenals glands: Unremarkable.  Kidneys, ureters, urinary bladder: There is minimal fullness of the right renal collecting system. The left kidney is unremarkable.  Reproductive: Hysterectomy.  Bowel and appendix: Constipation. No bowel obstruction. There is mild distention of the stomach without definite evidence of gastric outlet obstruction. A small pocket of air along the pylorus appears to be intraluminal. There is minimal apparent haziness of the fat surrounding the pylorus and  proximal duodenum which may be related to volume averaging artifact. Focal duodenitis is less likely but not excluded. Clinical correlation is recommended. The appendix is not visualized with certainty. No inflammatory changes identified in the right lower quadrant.  Vascular/Lymphatic: Mild atherosclerotic calcification of the aorta.No lymphadenopathy.  Abdominal wall/Musculoskeletal: Small fat containing umbilical hernia. A small supraumbilical peritoneal defect is noted containing small omental fat. Mild degenerative changes of the spine.  IMPRESSION: Mildly distended stomach without definite evidence of gastric outlet or small bowel obstruction.  Minimal apparent haziness of the fat surrounding the pylorus which may be artifactual or represent mild focal inflammatory changes. Clinical correlation is recommended.   Electronically Signed   By: Anner Crete M.D.   On: 08/17/2014 23:27    Assessment & Plan:   There are no diagnoses linked to this encounter. I am having Ms. Toulouse maintain her fluconazole, ESTRACE VAGINAL, escitalopram, cholecalciferol, ALPRAZolam, ibuprofen, bisacodyl, pantoprazole, Insulin Detemir, Insulin Pen Needle, metFORMIN, and ONETOUCH VERIO.  Meds ordered this encounter  Medications  . ONETOUCH VERIO test strip    Sig: 4 (four) times daily.     Follow-up: No Follow-up on file.  Walker Kehr, MD

## 2014-09-03 ENCOUNTER — Telehealth: Payer: Self-pay | Admitting: Internal Medicine

## 2014-09-03 ENCOUNTER — Ambulatory Visit: Payer: 59

## 2014-09-03 NOTE — Telephone Encounter (Signed)
Referral faxed to [facility] per pt request. They will review records and schedule directly with patient.

## 2014-09-03 NOTE — Telephone Encounter (Signed)
Patient spoke to dr Renne Crigler for endo referral, and can not bee seen until the end of august. She is asking that the referral be changed to dr patel with [facility].

## 2014-09-10 ENCOUNTER — Ambulatory Visit: Payer: 59

## 2014-09-14 NOTE — Telephone Encounter (Signed)
Notified pt that we did not hear from Dr. Alain Marion and Dr. Birdie Riddle leaving [place].

## 2014-09-15 ENCOUNTER — Encounter (INDEPENDENT_AMBULATORY_CARE_PROVIDER_SITE_OTHER): Payer: Self-pay

## 2014-09-17 ENCOUNTER — Ambulatory Visit: Payer: 59

## 2014-09-28 ENCOUNTER — Ambulatory Visit (INDEPENDENT_AMBULATORY_CARE_PROVIDER_SITE_OTHER): Payer: 59 | Admitting: Internal Medicine

## 2014-09-28 ENCOUNTER — Encounter: Payer: Self-pay | Admitting: Internal Medicine

## 2014-09-28 VITALS — BP 120/88 | HR 79 | Wt 168.0 lb

## 2014-09-28 DIAGNOSIS — I10 Essential (primary) hypertension: Secondary | ICD-10-CM

## 2014-09-28 DIAGNOSIS — E1165 Type 2 diabetes mellitus with hyperglycemia: Secondary | ICD-10-CM

## 2014-09-28 DIAGNOSIS — K219 Gastro-esophageal reflux disease without esophagitis: Secondary | ICD-10-CM | POA: Diagnosis not present

## 2014-09-28 DIAGNOSIS — IMO0002 Reserved for concepts with insufficient information to code with codable children: Secondary | ICD-10-CM

## 2014-09-28 MED ORDER — PANTOPRAZOLE SODIUM 20 MG PO TBEC: 20.0000 mg | DELAYED_RELEASE_TABLET | Freq: Every day | ORAL | Status: DC

## 2014-09-28 NOTE — Assessment & Plan Note (Signed)
On Protonix  Potential benefits of a long term PPI use as well as potential risks  and complications were explained to the patient and were aknowledged. Pepcid option discussed

## 2014-09-28 NOTE — Progress Notes (Signed)
Subjective:  Patient ID: Catherine Mora, female    DOB: [birth year]  Age: 64 y.o. MRN: [id number]  CC: No chief complaint on file.   HPI KENNI NEWTON presents for DM, h/o DKA, anxiety. Seeing Dr Posey Pronto at [facility] for Endo A1c was 11.4. Eating out twice a day.  Outpatient Prescriptions Prior to Visit  Medication Sig Dispense Refill  . ALPRAZolam (XANAX) 1 MG tablet Take 0.5-1 tablets (0.5-1 mg total) by mouth 2 (two) times daily as needed for sleep or anxiety. 60 tablet 3  . bisacodyl (DULCOLAX) 5 MG EC tablet Take 1 tablet (5 mg total) by mouth daily as needed for moderate constipation. 30 tablet 0  . cholecalciferol (VITAMIN D) 1000 UNITS tablet Take 1 tablet (1,000 Units total) by mouth daily. 100 tablet 3  . escitalopram (LEXAPRO) 10 MG tablet Take 1 tablet (10 mg total) by mouth daily. 90 tablet 2  . ESTRACE VAGINAL 0.1 MG/GM vaginal cream Place 1 Applicatorful vaginally once a week.     . fluconazole (DIFLUCAN) 200 MG tablet Take 200 mg by mouth every 30 (thirty) days.     Marland Kitchen ibuprofen (ADVIL,MOTRIN) 200 MG tablet Take 400 mg by mouth every 6 (six) hours as needed for headache.    . Insulin Detemir (LEVEMIR FLEXPEN) 100 UNIT/ML Pen Inject 16 Units into the skin daily with breakfast. 15 mL 2  . Insulin Pen Needle 31G X 5 MM MISC Use with Levemir pen 100 each 0  . ONETOUCH VERIO test strip 1 each by Other route 2 (two) times daily.     . metFORMIN (GLUCOPHAGE) 500 MG tablet Take 1 tablet (500 mg total) by mouth 2 (two) times daily with a meal. 60 tablet 11  . pantoprazole (PROTONIX) 20 MG tablet Take 1 tablet (20 mg total) by mouth daily. 30 tablet 0   No facility-administered medications prior to visit.    ROS Review of Systems  Constitutional: Negative for chills, activity change, appetite change, fatigue and unexpected weight change.  HENT: Negative for congestion, mouth sores and sinus pressure.   Eyes: Negative for visual disturbance.  Respiratory: Negative for cough  and chest tightness.   Gastrointestinal: Negative for nausea and abdominal pain.  Genitourinary: Negative for frequency, difficulty urinating and vaginal pain.  Musculoskeletal: Negative for back pain and gait problem.  Skin: Negative for pallor and rash.  Neurological: Negative for dizziness, tremors, weakness, numbness and headaches.  Psychiatric/Behavioral: Negative for suicidal ideas, confusion, sleep disturbance and decreased concentration. The patient is nervous/anxious.     Objective:  BP 120/88 mmHg  Pulse 79  Wt 168 lb (76.204 kg)  SpO2 97%  BP Readings from Last 3 Encounters:  09/28/14 120/88  08/27/14 140/90  08/22/14 133/87    Wt Readings from Last 3 Encounters:  09/28/14 168 lb (76.204 kg)  08/27/14 167 lb (75.751 kg)  08/17/14 169 lb 15.6 oz (77.1 kg)    Physical Exam  Constitutional: She appears well-developed. No distress.  HENT:  Head: Normocephalic.  Right Ear: External ear normal.  Left Ear: External ear normal.  Nose: Nose normal.  Mouth/Throat: Oropharynx is clear and moist.  Eyes: Conjunctivae are normal. Pupils are equal, round, and reactive to light. Right eye exhibits no discharge. Left eye exhibits no discharge.  Neck: Normal range of motion. Neck supple. No JVD present. No tracheal deviation present. No thyromegaly present.  Cardiovascular: Normal rate, regular rhythm and normal heart sounds.   Pulmonary/Chest: No stridor. No respiratory distress. She has no  wheezes.  Abdominal: Soft. Bowel sounds are normal. She exhibits no distension and no mass. There is no tenderness. There is no rebound and no guarding.  Musculoskeletal: She exhibits no edema or tenderness.  Lymphadenopathy:    She has no cervical adenopathy.  Neurological: She displays normal reflexes. No cranial nerve deficit. She exhibits normal muscle tone. Coordination normal.  Skin: No rash noted. No erythema.  Psychiatric: She has a normal mood and affect. Her behavior is normal.  Judgment and thought content normal.    Lab Results  Component Value Date   WBC 6.7 08/21/2014   HGB 12.2 08/21/2014   HCT 35.5* 08/21/2014   PLT 171 08/21/2014   GLUCOSE 136* 08/27/2014   CHOL 382* 08/17/2014   TRIG 324* 08/17/2014   HDL 48 08/17/2014   LDLDIRECT 219.0 08/14/2014   LDLCALC 269* 08/17/2014   ALT 20 08/17/2014   AST 24 08/17/2014   NA 137 08/27/2014   K 4.8 08/27/2014   CL 101 08/27/2014   CREATININE 0.67 08/27/2014   BUN 11 08/27/2014   CO2 28 08/27/2014   TSH 1.51 08/14/2014   HGBA1C 14.3* 08/17/2014    Ct Abdomen Pelvis W Contrast  08/17/2014   CLINICAL DATA:  64 year old female with nausea and vomiting since yesterday. Patient has medical history of IBS, hypertension, with recent diagnosis of diabetes.  EXAM: CT ABDOMEN AND PELVIS WITH CONTRAST  TECHNIQUE: Multidetector CT imaging of the abdomen and pelvis was performed using the standard protocol following bolus administration of intravenous contrast.  CONTRAST:  50mL OMNIPAQUE IOHEXOL 300 MG/ML SOLN, 157mL OMNIPAQUE IOHEXOL 300 MG/ML SOLN  COMPARISON:  Abdominal ultrasound dated 07/02/2012  FINDINGS: Lower chest:  Unremarkable.  Peritoneum: No free air or free fluid.  Liver: Unremarkable.No intrahepatic biliary ductal dilatation.  Gallbladder: Cholecystectomy.  Pancreas: Unremarkable.No ductal dilatation.  Spleen: Unremarkable.  Adrenals glands: Unremarkable.  Kidneys, ureters, urinary bladder: There is minimal fullness of the right renal collecting system. The left kidney is unremarkable.  Reproductive: Hysterectomy.  Bowel and appendix: Constipation. No bowel obstruction. There is mild distention of the stomach without definite evidence of gastric outlet obstruction. A small pocket of air along the pylorus appears to be intraluminal. There is minimal apparent haziness of the fat surrounding the pylorus and proximal duodenum which may be related to volume averaging artifact. Focal duodenitis is less likely but not  excluded. Clinical correlation is recommended. The appendix is not visualized with certainty. No inflammatory changes identified in the right lower quadrant.  Vascular/Lymphatic: Mild atherosclerotic calcification of the aorta.No lymphadenopathy.  Abdominal wall/Musculoskeletal: Small fat containing umbilical hernia. A small supraumbilical peritoneal defect is noted containing small omental fat. Mild degenerative changes of the spine.  IMPRESSION: Mildly distended stomach without definite evidence of gastric outlet or small bowel obstruction.  Minimal apparent haziness of the fat surrounding the pylorus which may be artifactual or represent mild focal inflammatory changes. Clinical correlation is recommended.   Electronically Signed   By: Anner Crete M.D.   On: 08/17/2014 23:27    Assessment & Plan:   Diagnoses and all orders for this visit:  Gastroesophageal reflux disease without esophagitis  Essential hypertension  Diabetes mellitus type 2, uncontrolled  Other orders -     pantoprazole (PROTONIX) 20 MG tablet; Take 1 tablet (20 mg total) by mouth daily.  I am having Ms. Ikard maintain her fluconazole, ESTRACE VAGINAL, escitalopram, cholecalciferol, ALPRAZolam, ibuprofen, bisacodyl, Insulin Pen Needle, ONETOUCH VERIO, Insulin Detemir, pantoprazole, and metFORMIN.  Meds ordered this encounter  Medications  . pantoprazole (PROTONIX) 20 MG tablet    Sig: Take 1 tablet (20 mg total) by mouth daily.    Dispense:  90 tablet    Refill:  3  . metFORMIN (GLUCOPHAGE-XR) 500 MG 24 hr tablet    Sig: Take 1 tablet by mouth 2 (two) times daily with a meal.     Follow-up: Return in about 6 months (around 03/31/2015) for a follow-up visit.  Walker Kehr, MD

## 2014-09-28 NOTE — Progress Notes (Signed)
Pre visit review using our clinic review tool, if applicable. No additional management support is needed unless otherwise documented below in the visit note. 

## 2014-09-28 NOTE — Assessment & Plan Note (Signed)
2016 Dr Posey Pronto - [facility] Levemir, Metformin XR On Rx A1c is better Shots discussed

## 2014-09-28 NOTE — Assessment & Plan Note (Signed)
NAS diet 

## 2014-10-06 ENCOUNTER — Encounter: Payer: 59 | Attending: Internal Medicine

## 2014-10-13 ENCOUNTER — Ambulatory Visit: Payer: 59

## 2014-10-13 ENCOUNTER — Ambulatory Visit: Payer: 59 | Admitting: Internal Medicine

## 2014-10-20 ENCOUNTER — Ambulatory Visit: Payer: 59

## 2014-10-24 ENCOUNTER — Ambulatory Visit (INDEPENDENT_AMBULATORY_CARE_PROVIDER_SITE_OTHER): Payer: BLUE CROSS/BLUE SHIELD | Admitting: Internal Medicine

## 2014-10-24 VITALS — BP 114/77 | HR 83 | Temp 100.1°F | Resp 15 | Wt 114.0 lb

## 2014-10-24 DIAGNOSIS — H04129 Dry eye syndrome of unspecified lacrimal gland: Secondary | ICD-10-CM | POA: Insufficient documentation

## 2014-10-24 DIAGNOSIS — K5289 Other specified noninfective gastroenteritis and colitis: Secondary | ICD-10-CM

## 2014-10-24 DIAGNOSIS — R350 Frequency of micturition: Secondary | ICD-10-CM

## 2014-10-24 DIAGNOSIS — K52839 Microscopic colitis, unspecified: Secondary | ICD-10-CM

## 2014-10-24 DIAGNOSIS — F419 Anxiety disorder, unspecified: Secondary | ICD-10-CM

## 2014-10-24 DIAGNOSIS — R5381 Other malaise: Secondary | ICD-10-CM

## 2014-10-24 DIAGNOSIS — R197 Diarrhea, unspecified: Secondary | ICD-10-CM

## 2014-10-24 LAB — COMPREHENSIVE METABOLIC PANEL
ALT (GPT): 15 U/L (ref 7–33)
AST (GOT): 18 U/L (ref 9–38)
Albumin: 4.2 g/dL (ref 3.5–5.2)
Alkaline Phosphatase (Total): 51 U/L (ref 31–132)
Anion Gap: 8 (ref 4–12)
Bilirubin (Total): 0.6 mg/dL (ref 0.2–1.3)
Calcium: 10.1 mg/dL (ref 8.9–10.2)
Carbon Dioxide, Total: 28 meq/L (ref 22–32)
Chloride: 102 meq/L (ref 98–108)
Creatinine: 0.77 mg/dL (ref 0.38–1.02)
GFR, Calc, African American: >60 mL/min (ref >59)
GFR, Calc, European American: >60 mL/min (ref >59)
Glucose: 96 mg/dL (ref 62–125)
Potassium: 4.7 meq/L (ref 3.6–5.2)
Protein (Total): 7.3 g/dL (ref 6.0–8.2)
Sodium: 138 meq/L (ref 135–145)
Urea Nitrogen: 20 mg/dL (ref 8–21)

## 2014-10-24 LAB — CBC, DIFF
% Basophils: 1 %
% Eosinophils: 4 %
% Immature Granulocytes: 0 %
% Lymphocytes: 33 %
% Monocytes: 9 %
% Neutrophils: 53 %
% Nucleated RBC: 0 %
Absolute Eosinophil Count: 0.29 10*3/uL (ref 0.00–0.50)
Absolute Lymphocyte Count: 2.38 10*3/uL (ref 1.00–4.80)
Basophils: 0.04 10*3/uL (ref 0.00–0.20)
Hematocrit: 37 % (ref 36–45)
Hemoglobin: 12.4 g/dL (ref 11.5–15.5)
Immature Granulocytes: 0.02 10*3/uL (ref 0.00–0.05)
MCH: 32.4 pg (ref 27.3–33.6)
MCHC: 33.6 g/dL (ref 32.2–36.5)
MCV: 96 fL (ref 81–98)
Monocytes: 0.67 10*3/uL (ref 0.00–0.80)
Neutrophils: 3.85 10*3/uL (ref 1.80–7.00)
Nucleated RBC: 0 10*3/uL
Platelet Count: 198 10*3/uL (ref 150–400)
RBC: 3.83 10*6/uL (ref 3.80–5.00)
RDW-CV: 12.7 % (ref 11.6–14.4)
WBC: 7.25 10*3/uL (ref 4.3–10.0)

## 2014-10-24 LAB — PR U/A AUTO DIPSTICK ONLY, ONSITE
Bilirubin, Urine: NEGATIVE
Glucose, Urine: NEGATIVE mg/dL
Ketones, URN: NEGATIVE mg/dL
Leukocytes: NEGATIVE
Nitrite, URN: NEGATIVE
Occult Blood, URN: NEGATIVE
Protein: NEGATIVE mg/dL
Specific Gravity, Urine: 1.015 (ref 1.005–1.030)
Urobilinogen, URN: 0.2 E.U./dL (ref 0.2–1.0)
pH, URN: 7.5 (ref 5.0–8.0)

## 2014-10-24 LAB — THYROID STIMULATING HORMONE: Thyroid Stimulating Hormone: 1.728 u[IU]/mL (ref 0.400–5.000)

## 2014-10-24 LAB — SED RATE: Erythrocyte Sedimentation Rate: 8 mm/h (ref 0–20)

## 2014-10-24 NOTE — Progress Notes (Signed)
Paula Bowman is a 64 year old woman here with complaint of colitis        Saw colonoscopy at [facility] with Dr. Callie Fielding.  Told she had microscopic colitis.  This is recurrence as she had it years ago in [place].  She was treated for 4-5 months and then fine until now.      She has had some intermittent diarrhea.  Sometimes explosive diarrhea.      They tried a couple of medications which did not work.    They started cholestyramine which was helping a little.  This plus imodium seemed to help with the chloestyramine.   Now she has started colestipol    She has a three week river cruise coming up Wednesday .  She cannot go now because of the colitis.     Had CT abdomen.     Overall she just feels bad.    Feels anxious about this.          Past history  Breast cancer with lumpectomy, XRT 2008  GERD  Cervical radiculopathy  Microscopic colitis       Exam  BP 114/77 mmHg  Pulse 83  Temp(Src) 100.1 F (37.8 C) (Temporal)  Resp 15  Wt 114 lb (51.71 kg)  SpO2 99%   Estimated body mass index is 17.68 kg/(m^2) as calculated from the following:    Height as of 04/17/14: 5' 7.32" (1.71 m).    Weight as of this encounter: 114 lb (51.71 kg).   General appearance : slender   CARDIAC:  Regular rate and rhythm.   Abdomen is soft, non-distended, non-tender. No rebound or guarding. No organomegaly.       Office Visit on 10/24/14   1. CBC, DIFF   Result Value Ref Range    WBC 7.25 4.3 - 10.0 10*3/uL    RBC 3.83 3.80 - 5.00 10*6/uL    Hemoglobin 12.4 11.5 - 15.5 g/dL    Hematocrit 37 36 - 45 %    MCV 96 81 - 98 fL    MCH 32.4 27.3 - 33.6 pg    MCHC 33.6 32.2 - 36.5 g/dL    Platelet Count 198 150 - 400 10*3/uL    RDW-CV 12.7 11.6 - 14.4 %    % Neutrophils 53 %    % Lymphocytes 33 %    % Monocytes 9 %    % Eosinophils 4 %    % Basophils 1 %    % Immature Granulocytes 0 %    Neutrophils 3.85 1.80 - 7.00 10*3/uL    Absolute Lymphocyte Count 2.38 1.00 - 4.80 10*3/uL    Monocytes 0.67 0.00 - 0.80 10*3/uL    Absolute Eosinophil  Count 0.29 0.00 - 0.50 10*3/uL    Basophils 0.04 0.00 - 0.20 10*3/uL    Immature Granulocytes 0.02 0.00 - 0.05 10*3/uL    Nucleated RBC 0.00 0.00 10*3/uL    % Nucleated RBC 0 %   2. THYROID STIMULATING HORMONE   Result Value Ref Range    Thyroid Stimulating Hormone 1.728 0.400 - 5.000 u[IU]/mL   3. COMPREHENSIVE METABOLIC PANEL   Result Value Ref Range    Sodium 138 135 - 145 meq/L    Potassium 4.7 3.6 - 5.2 meq/L    Chloride 102 98 - 108 meq/L    Carbon Dioxide, Total 28 22 - 32 meq/L    Anion Gap 8 4 - 12    Glucose 96 62 - 125 mg/dL  Urea Nitrogen 20 8 - 21 mg/dL    Creatinine 0.77 0.38 - 1.02 mg/dL    Protein (Total) 7.3 6.0 - 8.2 g/dL    Albumin 4.2 3.5 - 5.2 g/dL    Bilirubin (Total) 0.6 0.2 - 1.3 mg/dL    Calcium 10.1 8.9 - 10.2 mg/dL    AST (GOT) 18 9 - 38 U/L    Alkaline Phosphatase (Total) 51 31 - 132 U/L    ALT (GPT) 15 7 - 33 U/L    GFR, Calc, European American >60 >59 mL/min    GFR, Calc, African American >60 >59 mL/min    GFR, Information       Calculated GFR in mL/min/1.73 m2 by MDRD equation.  Inaccurate with changing renal function.  See http://[email or web address]   4. SED RATE   Result Value Ref Range    Erythrocyte Sedimentation Rate 8 0 - 20 mm/h   5. U/A AUTO DIPSTICK ONLY, ONSITE   Result Value Ref Range    Color, Urine YELLOW     Clarity, URN CLEAR     Glucose, Urine NEGATIVE NEG mg/dL    Bilirubin, Urine NEGATIVE NEG    Ketones, URN NEGATIVE NEG mg/dL    Specific Gravity, Urine 1.015 1.005 - 1.030    Occult Blood, URN NEGATIVE NEG    pH, URN 7.5 5.0 - 8.0    Protein NEGATIVE NEG-TRACE mg/dL    Urobilinogen, URN 0.2 0.2 - 1.0 E.U./dL    Nitrite, URN NEGATIVE NEG    Leukocytes NEGATIVE NEG        Assessment  Encounter Diagnoses   Name Primary?   . Microscopic colitis Yes   . Diarrhea, unspecified type    . Urinary frequency    . Malaise    . Anxiety       Plan      See gastroenterologist [place] for second opinion    Continue the imodium for now.    Labs

## 2014-10-24 NOTE — Patient Instructions (Addendum)
It was a pleasure to see you in clinic today.   Your Medical Assistant was: Larene Beach        See gastroenterologist.     Continue the imodium     You can schedule an appointment to see [place] by calling [phone] or via eCare.     For the  Labs that  were ordered today the results are expected to be available via eCare within 2 days     Thank you for choosing [facility].

## 2014-10-24 NOTE — Progress Notes (Signed)
Pt roomed, medications, and allergies verified and updated by August Luz, MA-C  August Luz, 10/24/2014 10:16 AM

## 2014-11-26 ENCOUNTER — Ambulatory Visit (INDEPENDENT_AMBULATORY_CARE_PROVIDER_SITE_OTHER): Payer: BLUE CROSS/BLUE SHIELD | Admitting: Family

## 2014-11-26 ENCOUNTER — Encounter (INDEPENDENT_AMBULATORY_CARE_PROVIDER_SITE_OTHER): Payer: Self-pay | Admitting: Family

## 2014-11-26 ENCOUNTER — Other Ambulatory Visit: Payer: Self-pay

## 2014-11-26 VITALS — BP 96/64 | HR 61 | Temp 97.9°F | Resp 16 | Wt 115.0 lb

## 2014-11-26 DIAGNOSIS — M79641 Pain in right hand: Secondary | ICD-10-CM

## 2014-11-26 MED ORDER — TRAMADOL HCL 50 MG OR TABS
50.0000 mg | ORAL_TABLET | Freq: Four times a day (QID) | ORAL | Status: DC | PRN
Start: 2014-11-26 — End: 2016-03-01

## 2014-11-26 NOTE — Patient Instructions (Addendum)
F/U Hand Surgeon. Osseous bone inflamation. OTC tylenol/motrin.      It was a pleasure to see you in clinic today. You were seen by Dr:  and your Medical Assistant name is: Elmyra Ricks             If you are not yet signed up for eCare, please speak with a team member at the front desk who would happy to assist you with this process.      eCare  enrollment will allow you access to the below benefits   You can make appointments online   View test results / Lab Results   Obtain a copy of our After Visit Summary (a summary of your visit today)     Your Test Results:  If labs were ordered today the results are expected to be available via eCare in about 5 days. If you have an active eCare account, this is how we will notify you of your results.     If you do not have an eCare account then your test results will be mailed to you within about 14 days after your tests are completed. If your physician needs to change your care based on your results or is concerned, you will receive a phone call.     If you have any questions about your test results please schedule an appointment with your provider.    **If it has been more than 2 weeks and you have not received your test results please send our office a message via [facility].    Medication Refills: If you need a prescription refilled, please contact your pharmacy 1 week before your current supply will run out to request the refill.  Contacting your pharmacy is the fastest and safest way to obtain a medication refill.  The pharmacy will notify our office.  Please note, that a minimum of 48 to 72 hours is needed to refill a medication, but refills are usually processed and sent to your pharmacy in about 5 business days.  Please call your pharmacy early to allow enough time to refill before you anticipate running out.  For faster medication refills, you can also schedule an appointment with your provider.    We know you have a choice in where you receive your healthcare and we  sincerely thank you for trusting [facility] with your health.      Parts of a Hand  Hands are made up of more bones and moving parts than most other areas of the body. When they're healthy, these parts all work together. They perform a large number of tasks. Hands do everything from very delicate movements to feats of strength.     Bones are hard tissues that give your hand shape and stability.   Phalanges are the finger bones.   Metacarpals are the middle part of the hand bones.   Carpals are the wrist bones.   Joints are places where bones fit together, allowing movement.   Ligaments are soft tissues that connect bone to bone and stabilize your joints.   Muscles are soft tissues that contract (tighten) and relax to move your hand.   The synovial lining produces the fluid inside your joints that helps make movement smooth.   Volar plates are hard tissues that stabilize the joints, keeping fingers from bending backward.   Tendon sheaths are fluid-filled tubes that surround, protect, and guide the tendons.   Tendons are cordlike soft tissues that connect muscle to bone.  Blood vessels carry blood to and from your hand.   Nerves send and receive messages, allowing you to feel and directmovement.   The palmar fascia is a firm layer of soft tissue that stabilizes the palm of your hand.   [address] [employer] [place], [place] [address]. All rights reserved. This information is not intended as a substitute for professional medical care. Always follow your healthcare professional's instructions.

## 2014-11-26 NOTE — Progress Notes (Signed)
Chief Complaint   Patient presents with   . Hand Pain     Pt c/o right hand pain and swelling, has a small pea size lump under pinky finger x 6 months. Has gotten worse in the last day.        SUBJECTIVE:  Janney Priego is an 64 year old female who presents with pain, swelling, and pea sized lump to palmar aspect of R hand under 5th finger.  Increasing swelling and pain in last 2-3 days.  Pain described as throbbing, constant and rated 10/10.  No relieving or precipitating factors.       Review of patient's allergies indicates:  Allergies   Allergen Reactions   . Amoxicillin Rash   . Levofloxacin Itching     Redness up arm from IV dose   . Penicillin G Rash   . Terbinafine Hcl      Loss of sense of taste         Review of Systems   Musculoskeletal:        Hand pain   All other systems reviewed and are negative.      I personally reviewed and confirmed the past medical history in the record with the patient today.      OBJECTIVE:  BP 96/64 mmHg  Pulse 61  Temp(Src) 97.9 F (36.6 C) (Temporal)  Resp 16  Wt 115 lb (52.164 kg)  SpO2 100%  Physical Exam   Constitutional: She is oriented to person, place, and time. She appears well-developed and well-nourished.   HENT:   Head: Normocephalic and atraumatic.   Neck: Normal range of motion. Neck supple.   Cardiovascular: Intact distal pulses.    Pulmonary/Chest: Effort normal.   Musculoskeletal: Normal range of motion.   Right hand distal 5th metacarpal swelling and pain   Neurological: She is alert and oriented to person, place, and time.   Skin: Skin is warm and dry.   Nursing note and vitals reviewed.      ASSESSMENT/PLAN  (M79.641) Pain of right hand  (primary encounter diagnosis)  Plan: XR HAND 3+ VW RIGHT, TraMADol HCl 50 MG Oral         Tab    F/U Hand Surgeon. Osseous bone inflamation. OTC tylenol/motrin.     4 views of right hand- no fx, no disclocation, no sts + ossicle bones to distal 5th metacarpal    Discussed medication in detail including  dosing, side effects, and interactions.    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 Mild   osteoarthritis is present at the first [facility], first MCP, second MCP, and DIP   joints.      There is no acute fracture or dislocation. Small corticated ossicles at the   ulnar aspect of the fifth MCP joint are likely accessory ossicles.  ATTENDING RADIOLOGIST AND PAGER NUMBER  [phone] Clare Charon MD  [phone]

## 2014-11-26 NOTE — Progress Notes (Signed)
Vitals taken, medication and allergies verified. Patient roomed by: Issac Moure, CMA

## 2014-11-26 NOTE — Telephone Encounter (Signed)
Signed  Radiology ROI per patient / Legal guardian

## 2014-12-05 ENCOUNTER — Other Ambulatory Visit: Payer: Self-pay

## 2014-12-09 ENCOUNTER — Encounter (INDEPENDENT_AMBULATORY_CARE_PROVIDER_SITE_OTHER): Payer: BLUE CROSS/BLUE SHIELD | Admitting: Gastroenterology

## 2015-02-04 ENCOUNTER — Ambulatory Visit (INDEPENDENT_AMBULATORY_CARE_PROVIDER_SITE_OTHER): Payer: BLUE CROSS/BLUE SHIELD | Admitting: Family

## 2015-02-04 ENCOUNTER — Encounter (INDEPENDENT_AMBULATORY_CARE_PROVIDER_SITE_OTHER): Payer: Self-pay | Admitting: Family

## 2015-02-04 VITALS — BP 118/80 | HR 85 | Temp 98.2°F | Resp 14 | Wt 115.0 lb

## 2015-02-04 DIAGNOSIS — J0110 Acute frontal sinusitis, unspecified: Secondary | ICD-10-CM

## 2015-02-04 MED ORDER — DOXYCYCLINE MONOHYDRATE 100 MG OR CAPS
100.0000 mg | ORAL_CAPSULE | Freq: Two times a day (BID) | ORAL | Status: AC
Start: 2015-02-04 — End: 2015-02-14

## 2015-02-04 NOTE — Progress Notes (Signed)
Verified allergies, medications, vitals , Ahman Dugdale, MA

## 2015-02-04 NOTE — Patient Instructions (Addendum)
It was a pleasure to see you in clinic today. Your medical assistant was Gaines Cartmell            You can schedule an appointment to see us by calling [phone] or via eCare.     If labs were ordered today the results are expected to be available via eCare 5 days later. Otherwise, result letters are mailed 7-10 days after your tests are completed. If your physician needs to change your care based on your results, you will receive a phone call to notify you. If you haven't heard from him/her and it has been more than 10 days please give us a call.     Thank you for choosing [facility]    Sinusitis (Antibiotic Treatment)    The sinuses are air-filled spaces within the bones of the face. They connect to the inside of the nose.Sinusitisis an inflammation of the tissue lining the sinus cavity. Sinus inflammation can occur during a cold. It can also be due to allergies to pollens and other particles in the air. Sinusitis can cause symptoms of sinus congestion and fullness. A sinus infection causes fever, headache and facial pain. There is often green or yellow drainage from the nose or into the back of the throat (post-nasal drip). You have been given antibiotics to treat this condition.  Home care:   Take the full course of antibiotics as instructed. Do not stop taking them, even if you feel better.   Drink plenty of water, hot tea, and other liquids. This may help thin mucus. It also may promote sinus drainage.   Heat may help soothe painful areas of the face. Use a towel soaked in hot water. Or, stand in the shower and direct the hot spray onto your face. Using a vaporizer along with a menthol rub at night may also help.   Anexpectorantcontaining guaifenesin may help thin the mucus and promote drainage from the sinuses.   Over-the-counterdecongestantsmay be used unless a similar medicine was prescribed. Nasal sprays work the fastest. Use one that contains phenylephrine or oxymetazoline.  First blow the nose gently. Then use the spray. Do not use these medicines more often than directed on the label or symptoms may get worse. You may also use tablets containing pseudoephedrine. Avoid products that combine ingredients, because side effects may be increased. Read labels. You can also ask the pharmacist for help. (NOTE:Persons with high blood pressure should not use decongestants. They can raise blood pressure.)   Over-the-counterantihistaminesmay help if allergies contributed to your sinusitis.    Do not use nasal rinses or irrigation during an acute sinus infection, unless told to by your health care provider. Rinsing may spread the infection to other sinuses.   Use acetaminophen or ibuprofen to control pain, unless another pain medicine was prescribed. (If you have chronic liver or kidney disease or ever had a stomach ulcer, talk with your doctor before using these medicines. Aspirin should never be used in anyone under 18 years of age who is ill with a fever. It may cause severe liver damage.)   Don't smoke. This can worsen symptoms.  Follow-up care  Follow up with your healthcare provider or our staff if you are not improving within the next week.  When to seek medical advice  Call your healthcare provider if any of these occur:   Facial pain or headache becoming more severe   Stiff neck   Unusual drowsiness or confusion   Swelling of the forehead or   eyelids   Vision problems, including blurred or double vision   Fever of100.56F (38C)or higher, or as directed by your healthcare provider   Seizure   Breathing problems   Symptoms not resolving within 10 days   2000-2016 [employer] [place], [place] [address]. All rights reserved. This information is not intended as a substitute for professional medical care. Always follow your healthcare professional's instructions.

## 2015-02-04 NOTE — Progress Notes (Signed)
Chief Complaint   Patient presents with   . Sinus Problem     started a week ago with a cough        SUBJECTIVE:  Paula Bowman is an 64 year old female who presents with yellow-green nasal drainage x 2d. Started 1 wk ago. Facial pressure, headaches, dull, unable to give a number.  Productive cough. Dayquil, allegra, flonase, nyquil, mild relief with meds. No fever.      Review of patient's allergies indicates:  Allergies   Allergen Reactions   . Amoxicillin Rash   . Levofloxacin Itching     Redness up arm from IV dose   . Penicillin G Rash   . Terbinafine Hcl      Loss of sense of taste         Review of Systems   HENT: Positive for rhinorrhea and sinus pressure.    Respiratory: Positive for cough.    All other systems reviewed and are negative.      I personally reviewed and confirmed the past medical history in the record with the patient today.      OBJECTIVE:  BP 118/80 mmHg  Pulse 85  Temp(Src) 98.2 F (36.8 C) (Tympanic)  Resp 14  Wt 115 lb (52.164 kg)  SpO2 100%  Physical Exam   Constitutional: She is oriented to person, place, and time. She appears well-developed and well-nourished.   HENT:   Head: Normocephalic and atraumatic.   Right Ear: External ear normal.   Left Ear: External ear normal.   Nose: Nose normal.   Mouth/Throat: Oropharynx is clear and moist. No oropharyngeal exudate.   + sinus tenderness   Neck: Normal range of motion. Neck supple.   Cardiovascular: Normal rate, regular rhythm and normal heart sounds.    Pulmonary/Chest: Effort normal and breath sounds normal.   Musculoskeletal: Normal range of motion.   Neurological: She is alert and oriented to person, place, and time.   Skin: Skin is warm and dry.   Nursing note and vitals reviewed.      ASSESSMENT/PLAN  (J01.10) Acute non-recurrent frontal sinusitis  (primary encounter diagnosis)  Plan: Doxycycline Monohydrate 100 MG Oral Cap        Discussed medication in detail including dosing, side effects, and  interactions.    Trudee Kuster, [facility]

## 2015-02-15 ENCOUNTER — Encounter (INDEPENDENT_AMBULATORY_CARE_PROVIDER_SITE_OTHER): Payer: Self-pay | Admitting: Family

## 2015-02-15 ENCOUNTER — Ambulatory Visit (INDEPENDENT_AMBULATORY_CARE_PROVIDER_SITE_OTHER): Payer: BLUE CROSS/BLUE SHIELD | Admitting: Family

## 2015-02-15 VITALS — BP 104/72 | HR 73 | Temp 99.0°F

## 2015-02-15 DIAGNOSIS — J3089 Other allergic rhinitis: Secondary | ICD-10-CM

## 2015-02-15 MED ORDER — IPRATROPIUM BROMIDE 0.06 % NA SOLN
2.0000 | Freq: Every day | NASAL | Status: DC
Start: 2015-02-15 — End: 2016-12-07

## 2015-02-15 NOTE — Progress Notes (Signed)
Medication allergies confirmed and vitals recorded by Aesha Agrawal.

## 2015-02-15 NOTE — Patient Instructions (Addendum)
It was a pleasure to see you in clinic today.              You can schedule an appointment to see [place] by calling [phone] or via eCare.     If labs were ordered today the results are expected to be available via eCare 5 days later. Otherwise, result letters are mailed 7-10 days after your tests are completed. If your physician needs to change your care based on your results, you will receive a phone call to notify you. If you haven't heard from him/her and it has been more than 10 days please give [place] a call.     Thank you for choosing [facility].             Allergic Rhinitis  Allergic rhinitis is an allergic reaction that affects the nose, and often the eyes. It's often known asnasal allergies. Nasal allergies are often due to things in the environment that are breathed in. Depending what you are sensitive to, nasal allergies may occur only during certain seasons. Or they may occur year round. Common indoor allergens include house dust mites, mold, cockroaches, and pet dander. Outdoor allergens include pollen from trees, grasses, and weeds.  Symptoms include a drippy, stuffy, and itchy nose. They also include sneezing and red and itchy eyes. You may feel tired more often. Severe allergies may also affect your breathing and trigger a condition called asthma.  Tests can be done to see what allergens are affecting you. You may be referred to an allergy specialist for testing and further evaluation.  Home care  The healthcare provider may prescribe medicines to help relieve allergy symptoms.  Ask the provider for advice on how to avoid substances that you are allergic to.Below are a few tips for each type of allergen.  Pet dander:   Do not have pets with fur and feathers.   If you cannot avoid having a pet, keep it out of your bedroom and off upholstered furniture.  Pollen:   When pollen counts are high, keep windows of your car and home closed. If possible, use an air conditioner  instead.   Wear a filter mask when mowing or doing yard work.  House dust mites:   Wash bedding every week in warm water and detergent and dry on a hot setting.   Cover the mattress, box spring, and pillows with allergy covers.   If possible, sleep in a room with no carpet, curtains, or upholstered furniture.  Cockroaches:   Store food in sealed containers.   Remove garbage from the home promptly.   Fix water leaks  Mold:   Keep humidity low by using a dehumidifier or air conditioner. Keep the dehumidifier and air conditioner clean and free of mold.   Clean moldy areas with bleach and water.  In general:   Vacuum once or twice a week. If possible, use a vacuum with a high-efficiency particulate air (HEPA) filter.   Do not smoke. Avoid cigarette smoke. Cigarette smoke is an irritant that can make symptoms worse.  Follow-up care  Follow up as advised by the health care provider or our staff. If you were referred to an allergy specialist, make this appointment promptly.  When to seek medical advice  Call your healthcare provider right away if the following occur:   Coughing or wheezing   Fever greater than 100.15F (38C)   Continuing symptoms, new symptoms, or worsening symptoms  Call 911 right awayif you have:  Trouble breathing   Hives (raised red bumps)   Severe swelling of the face or severe itching of the eyes or mouth   2000-2016 [employer], [place] [address]. All rights reserved. This information is not intended as a substitute for professional medical care. Always follow your healthcare professional's instructions.

## 2015-02-15 NOTE — Progress Notes (Signed)
Chief Complaint   Patient presents with   . Sinus Problem     Sinus drainage- s/p sinus infection - Completed antibiotics- Using Flonase       SUBJECTIVE:  Paula Bowman is an 64 year old female who presents with concern for nasal drainage. SInus infection cleared up. Continues to have clear nasal drainage. Has relief with nyquil. Hasnt started netti pot.  Doesn't feel flonase is working well anymore.    Review of patient's allergies indicates:  Allergies   Allergen Reactions   . Amoxicillin Rash   . Levofloxacin Itching     Redness up arm from IV dose   . Penicillin G Rash   . Terbinafine Hcl      Loss of sense of taste         Review of Systems   HENT: Positive for rhinorrhea.    All other systems reviewed and are negative.      I personally reviewed and confirmed the past medical history in the record with the patient today.      OBJECTIVE:  BP 104/72 mmHg  Pulse 73  Temp(Src) 99 F (37.2 C) (Temporal)  Wt   Physical Exam   Constitutional: She is oriented to person, place, and time. She appears well-developed and well-nourished.   HENT:   Head: Normocephalic and atraumatic.   Neck: Normal range of motion.   Pulmonary/Chest: Effort normal.   Musculoskeletal: Normal range of motion.   Neurological: She is alert and oriented to person, place, and time.   Skin: Skin is warm and dry.   Nursing note and vitals reviewed.      ASSESSMENT/PLAN  (J30.89) Perennial allergic rhinitis, unspecified allergic rhinitis trigger  (primary encounter diagnosis)  Plan: Ipratropium Bromide 0.06 % Nasal Solution    F/U ENT  Consider daily antihistamine instead of nyquil    Discussed medication in detail including dosing, side effects, and interactions.    Trudee Kuster, [facility]

## 2015-03-31 ENCOUNTER — Ambulatory Visit (INDEPENDENT_AMBULATORY_CARE_PROVIDER_SITE_OTHER): Payer: Medicare Other | Admitting: Internal Medicine

## 2015-03-31 ENCOUNTER — Encounter: Payer: Self-pay | Admitting: Internal Medicine

## 2015-03-31 VITALS — BP 130/90 | HR 74 | Wt 191.0 lb

## 2015-03-31 DIAGNOSIS — F4323 Adjustment disorder with mixed anxiety and depressed mood: Secondary | ICD-10-CM

## 2015-03-31 DIAGNOSIS — E119 Type 2 diabetes mellitus without complications: Secondary | ICD-10-CM | POA: Diagnosis not present

## 2015-03-31 DIAGNOSIS — Z794 Long term (current) use of insulin: Secondary | ICD-10-CM

## 2015-03-31 DIAGNOSIS — I10 Essential (primary) hypertension: Secondary | ICD-10-CM

## 2015-03-31 MED ORDER — ALPRAZOLAM 1 MG PO TABS: 0.5000 mg | ORAL_TABLET | Freq: Two times a day (BID) | ORAL | Status: DC | PRN

## 2015-03-31 MED ORDER — ESCITALOPRAM OXALATE 10 MG PO TABS: 10.0000 mg | ORAL_TABLET | Freq: Every day | ORAL | Status: DC

## 2015-03-31 NOTE — Progress Notes (Signed)
Subjective:  Patient ID: Catherine Mora, female    DOB: 06-Jan-1951  Age: 65 y.o. MRN: [id number]  CC: No chief complaint on file.   HPI Catherine Mora presents for DM, depression, GERD f/u. Getting a new dog  Outpatient Prescriptions Prior to Visit  Medication Sig Dispense Refill  . ALPRAZolam (XANAX) 1 MG tablet Take 0.5-1 tablets (0.5-1 mg total) by mouth 2 (two) times daily as needed for sleep or anxiety. 60 tablet 3  . cholecalciferol (VITAMIN D) 1000 UNITS tablet Take 1 tablet (1,000 Units total) by mouth daily. 100 tablet 3  . escitalopram (LEXAPRO) 10 MG tablet Take 1 tablet (10 mg total) by mouth daily. 90 tablet 2  . ESTRACE VAGINAL 0.1 MG/GM vaginal cream Place 1 Applicatorful vaginally once a week.     . fluconazole (DIFLUCAN) 200 MG tablet Take 200 mg by mouth every 30 (thirty) days.     Marland Kitchen ibuprofen (ADVIL,MOTRIN) 200 MG tablet Take 400 mg by mouth every 6 (six) hours as needed for headache.    . Insulin Detemir (LEVEMIR FLEXPEN) 100 UNIT/ML Pen Inject 16 Units into the skin daily with breakfast. 15 mL 2  . Insulin Pen Needle 31G X 5 MM MISC Use with Levemir pen 100 each 0  . metFORMIN (GLUCOPHAGE-XR) 500 MG 24 hr tablet Take 1 tablet by mouth 2 (two) times daily with a meal.    . ONETOUCH VERIO test strip 1 each by Other route 2 (two) times daily.     . bisacodyl (DULCOLAX) 5 MG EC tablet Take 1 tablet (5 mg total) by mouth daily as needed for moderate constipation. (Patient not taking: Reported on 03/31/2015) 30 tablet 0  . pantoprazole (PROTONIX) 20 MG tablet Take 1 tablet (20 mg total) by mouth daily. (Patient not taking: Reported on 03/31/2015) 90 tablet 3   No facility-administered medications prior to visit.    ROS Review of Systems  Constitutional: Negative for chills, activity change, appetite change, fatigue and unexpected weight change.  HENT: Negative for congestion, mouth sores and sinus pressure.   Eyes: Negative for visual disturbance.  Respiratory:  Negative for cough and chest tightness.   Gastrointestinal: Negative for nausea and abdominal pain.  Genitourinary: Negative for frequency, difficulty urinating and vaginal pain.  Musculoskeletal: Negative for back pain and gait problem.  Skin: Negative for pallor and rash.  Neurological: Negative for dizziness, tremors, weakness, numbness and headaches.  Psychiatric/Behavioral: Negative for suicidal ideas, confusion and sleep disturbance. The patient is nervous/anxious.     Objective:  BP 130/90 mmHg  Pulse 74  Wt 191 lb (86.637 kg)  SpO2 96%  BP Readings from Last 3 Encounters:  03/31/15 130/90  09/28/14 120/88  08/27/14 140/90    Wt Readings from Last 3 Encounters:  03/31/15 191 lb (86.637 kg)  09/28/14 168 lb (76.204 kg)  08/27/14 167 lb (75.751 kg)    Physical Exam  Constitutional: She appears well-developed. No distress.  HENT:  Head: Normocephalic.  Right Ear: External ear normal.  Left Ear: External ear normal.  Nose: Nose normal.  Mouth/Throat: Oropharynx is clear and moist.  Eyes: Conjunctivae are normal. Pupils are equal, round, and reactive to light. Right eye exhibits no discharge. Left eye exhibits no discharge.  Neck: Normal range of motion. Neck supple. No JVD present. No tracheal deviation present. No thyromegaly present.  Cardiovascular: Normal rate, regular rhythm and normal heart sounds.   Pulmonary/Chest: No stridor. No respiratory distress. She has no wheezes.  Abdominal: Soft. Bowel  sounds are normal. She exhibits no distension and no mass. There is no tenderness. There is no rebound and no guarding.  Musculoskeletal: She exhibits no edema or tenderness.  Lymphadenopathy:    She has no cervical adenopathy.  Neurological: She displays normal reflexes. No cranial nerve deficit. She exhibits normal muscle tone. Coordination normal.  Skin: No rash noted. No erythema.  Psychiatric: She has a normal mood and affect. Her behavior is normal. Judgment and  thought content normal.    Lab Results  Component Value Date   WBC 6.7 08/21/2014   HGB 12.2 08/21/2014   HCT 35.5* 08/21/2014   PLT 171 08/21/2014   GLUCOSE 136* 08/27/2014   CHOL 382* 08/17/2014   TRIG 324* 08/17/2014   HDL 48 08/17/2014   LDLDIRECT 219.0 08/14/2014   LDLCALC 269* 08/17/2014   ALT 20 08/17/2014   AST 24 08/17/2014   NA 137 08/27/2014   K 4.8 08/27/2014   CL 101 08/27/2014   CREATININE 0.67 08/27/2014   BUN 11 08/27/2014   CO2 28 08/27/2014   TSH 1.51 08/14/2014   HGBA1C 14.3* 08/17/2014    Ct Abdomen Pelvis W Contrast  08/17/2014  CLINICAL DATA:  65 year old female with nausea and vomiting since yesterday. Patient has medical history of IBS, hypertension, with recent diagnosis of diabetes. EXAM: CT ABDOMEN AND PELVIS WITH CONTRAST TECHNIQUE: Multidetector CT imaging of the abdomen and pelvis was performed using the standard protocol following bolus administration of intravenous contrast. CONTRAST:  10mL OMNIPAQUE IOHEXOL 300 MG/ML SOLN, 117mL OMNIPAQUE IOHEXOL 300 MG/ML SOLN COMPARISON:  Abdominal ultrasound dated 07/02/2012 FINDINGS: Lower chest:  Unremarkable. Peritoneum: No free air or free fluid. Liver: Unremarkable.No intrahepatic biliary ductal dilatation. Gallbladder: Cholecystectomy. Pancreas: Unremarkable.No ductal dilatation. Spleen: Unremarkable. Adrenals glands: Unremarkable. Kidneys, ureters, urinary bladder: There is minimal fullness of the right renal collecting system. The left kidney is unremarkable. Reproductive: Hysterectomy. Bowel and appendix: Constipation. No bowel obstruction. There is mild distention of the stomach without definite evidence of gastric outlet obstruction. A small pocket of air along the pylorus appears to be intraluminal. There is minimal apparent haziness of the fat surrounding the pylorus and proximal duodenum which may be related to volume averaging artifact. Focal duodenitis is less likely but not excluded. Clinical  correlation is recommended. The appendix is not visualized with certainty. No inflammatory changes identified in the right lower quadrant. Vascular/Lymphatic: Mild atherosclerotic calcification of the aorta.No lymphadenopathy. Abdominal wall/Musculoskeletal: Small fat containing umbilical hernia. A small supraumbilical peritoneal defect is noted containing small omental fat. Mild degenerative changes of the spine. IMPRESSION: Mildly distended stomach without definite evidence of gastric outlet or small bowel obstruction. Minimal apparent haziness of the fat surrounding the pylorus which may be artifactual or represent mild focal inflammatory changes. Clinical correlation is recommended. Electronically Signed   By: Anner Crete M.D.   On: 08/17/2014 23:27    Assessment & Plan:   There are no diagnoses linked to this encounter. I am having Ms. Mcphatter maintain her fluconazole, ESTRACE VAGINAL, escitalopram, cholecalciferol, ALPRAZolam, ibuprofen, bisacodyl, Insulin Pen Needle, ONETOUCH VERIO, Insulin Detemir, pantoprazole, and metFORMIN.  No orders of the defined types were placed in this encounter.     Follow-up: No Follow-up on file.  Walker Kehr, MD

## 2015-03-31 NOTE — Assessment & Plan Note (Signed)
Potential benefits of a long term benzodiazepines  use as well as potential risks  and complications were explained to the patient and were aknowledged. Xanax prn, Lexapro

## 2015-03-31 NOTE — Assessment & Plan Note (Signed)
Labs w/Dr Posey Pronto - [facility] A1c<7.0% Levemir, Metformin

## 2015-03-31 NOTE — Progress Notes (Signed)
Pre visit review using our clinic review tool, if applicable. No additional management support is needed unless otherwise documented below in the visit note. 

## 2015-03-31 NOTE — Assessment & Plan Note (Signed)
BP Readings from Last 3 Encounters:  03/31/15 130/90  09/28/14 120/88  08/27/14 140/90

## 2015-04-18 IMAGING — US US ABDOMEN COMPLETE
1 series · 13 of 25 positions shown · non-contrast
Comparison: Renal ultrasound 03/19/2009

CLINICAL DATA: Right upper quadrant pain

ABDOMINAL ULTRASOUND COMPLETE

[Series 1: us abdomen complete · 0.30mm/px · 13 of 97 slices shown]
[im 1/97]
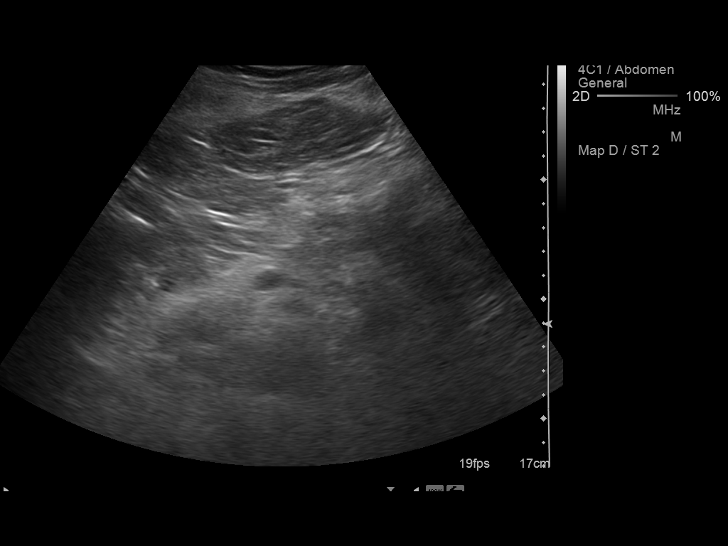
[im 9/97]
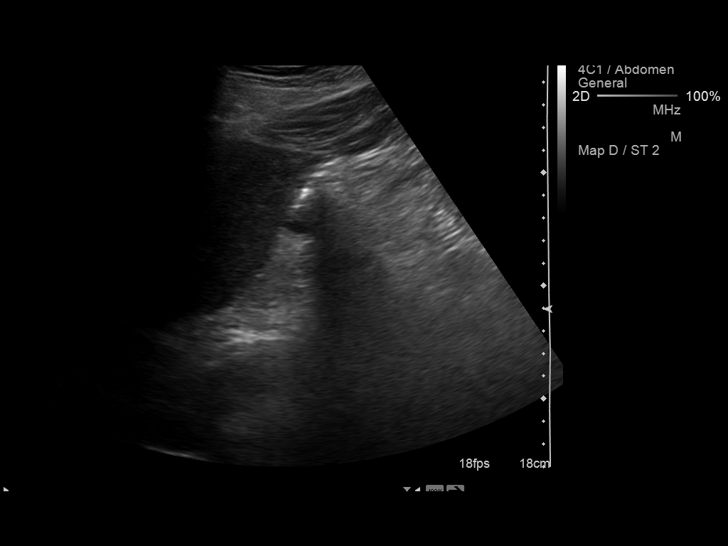
[im 17/97]
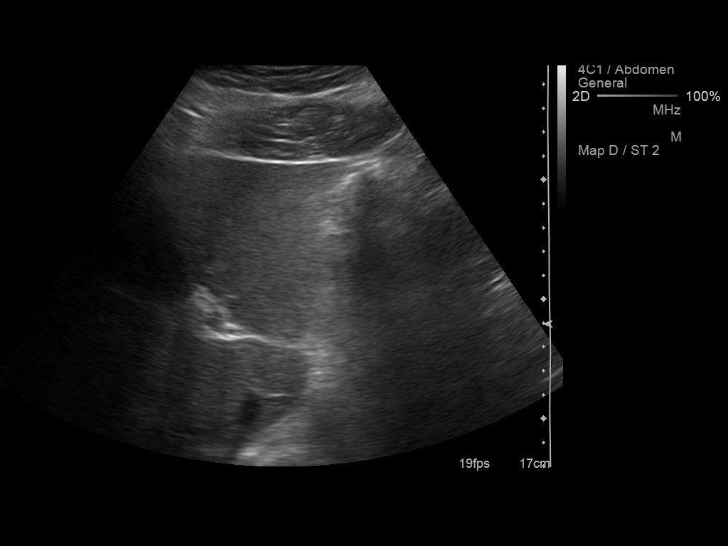
[im 25/97]
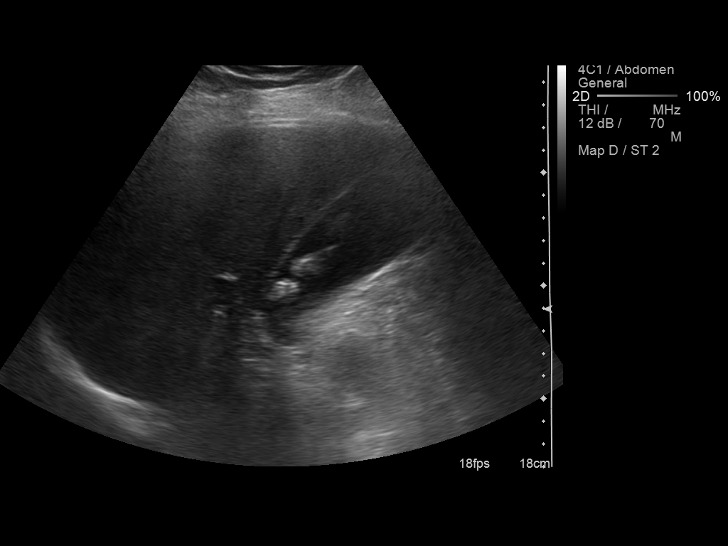
[im 33/97]
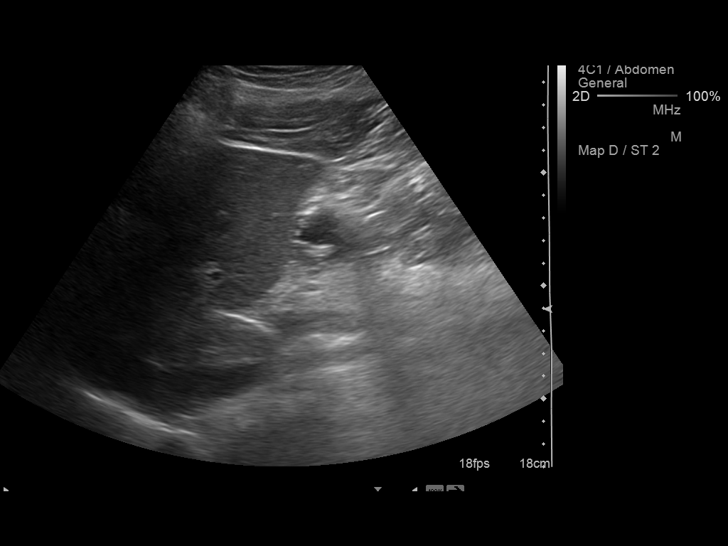
[im 41/97]
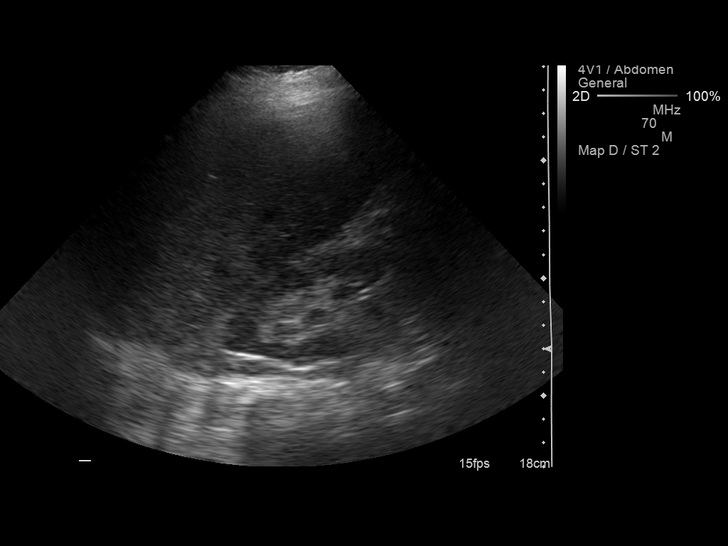
[im 49/97]
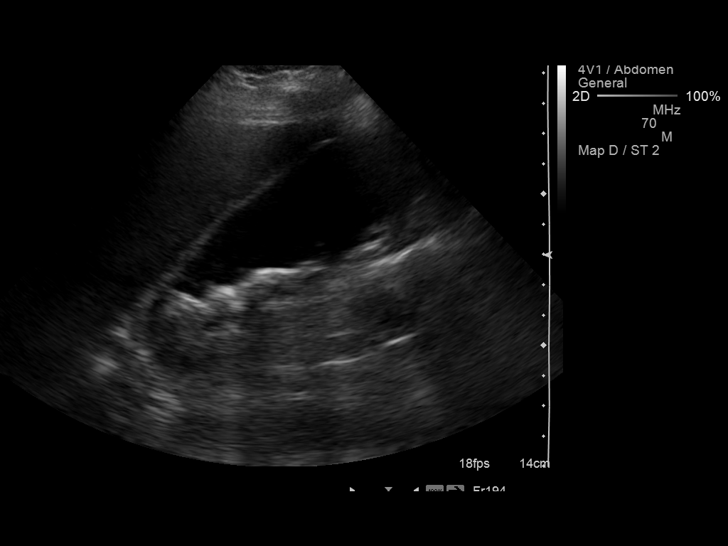
[im 57/97]
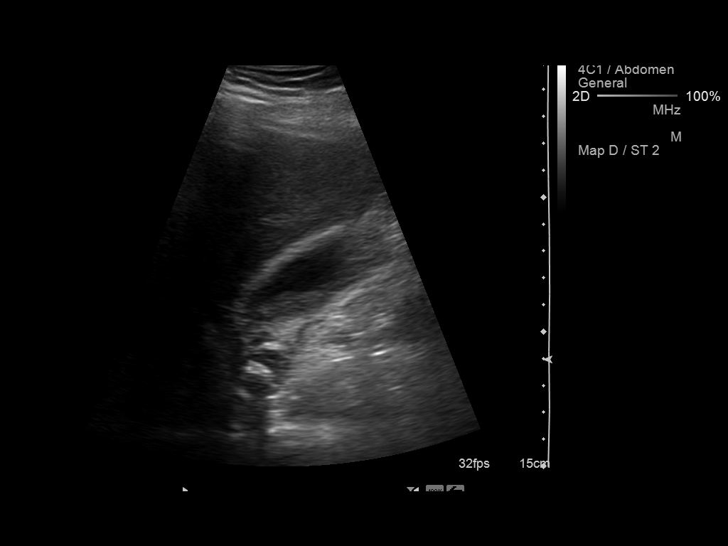
[im 65/97]
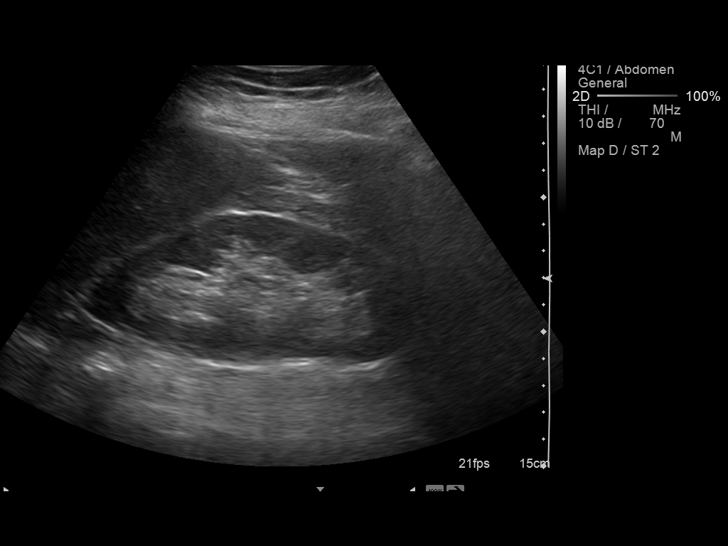
[im 73/97]
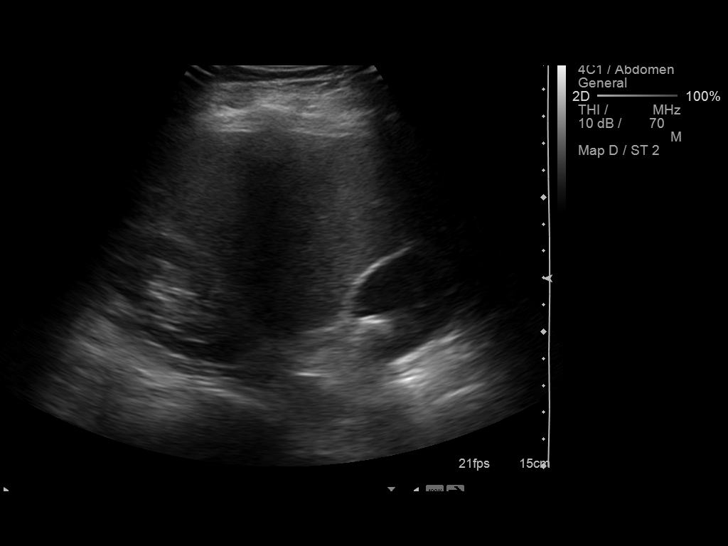
[im 81/97]
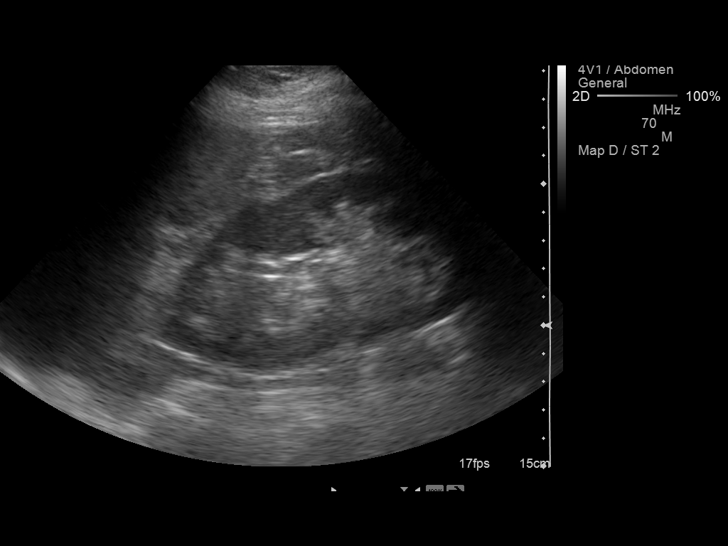
[im 89/97]
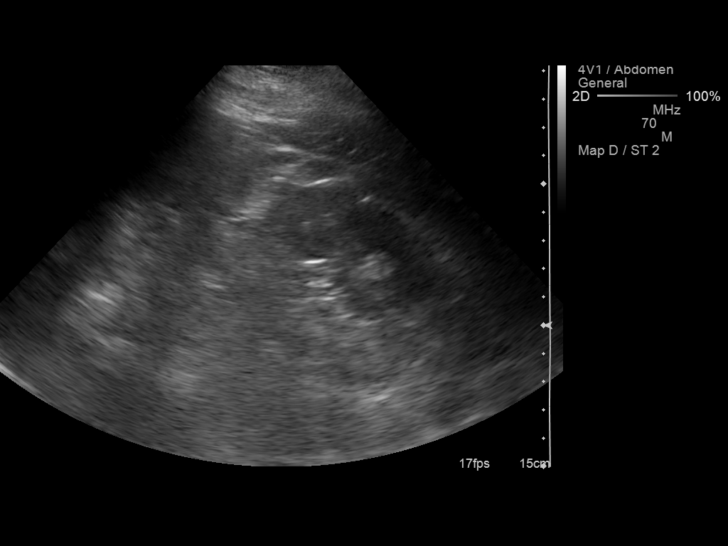
[im 97/97]
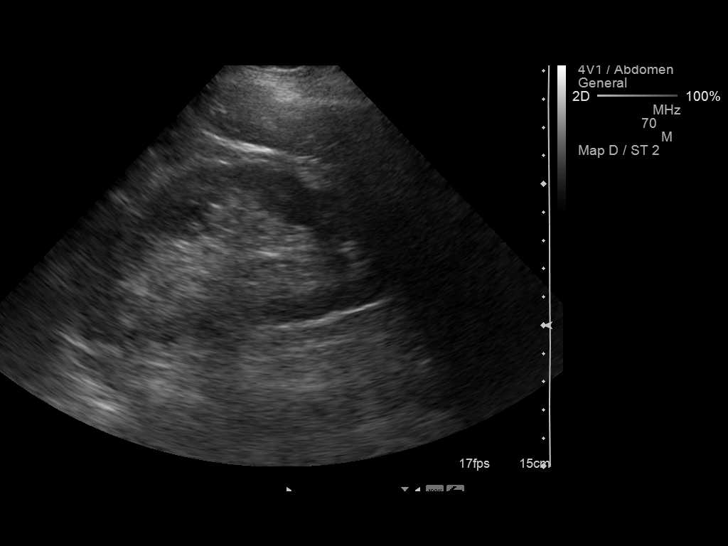

[13 of 25 positions shown; findings below may reference images not displayed]

FINDINGS: Gallbladder:  There are multiple gallstones, the largest measuring
approximately 1.5 cm.  There is a 1.3 cm gallstone within the
gallbladder neck that does not definitely move during the
examination.  Gallbladder wall thickness is normal, at 2 mm.  The
sonographic Murphy's sign is negative.

Common Bile Duct:  Common bile duct measures between 8 to 12 mm.
It measures 12 mm at the level of the pancreatic head. No filling
defects are identified within the visualized portion of the common
bile duct, but the entire duct is not visualized on ultrasound.

Liver: No focal mass lesion identified.  Within normal limits in
parenchymal echogenicity.

IVC:  Appears normal.

Pancreas:  The visualized portion of the pancreas appears normal.
Pancreas is obscured by bowel gas.

Spleen:  Within normal limits in size and echotexture.

Right kidney:  Normal in size and parenchymal echogenicity.  No
evidence of mass or hydronephrosis.

Left kidney:  Normal in size and parenchymal echogenicity.  No
evidence of mass or hydronephrosis.

Abdominal Aorta:  No aneurysm identified.
IMPRESSION: 1.  Cholelithiasis.  Cannot exclude an impacted stone in the
gallbladder neck.
2.  Gallbladder wall thickness is normal and the sonographic
Murphy's sign is negative.
3.  Dilated common bile duct.  Bile duct obstruction cannot be
excluded. A cause for the dilated common bile duct is not
visualized on ultrasound.

## 2015-04-19 DIAGNOSIS — J069 Acute upper respiratory infection, unspecified: Secondary | ICD-10-CM | POA: Diagnosis not present

## 2015-04-19 DIAGNOSIS — H1089 Other conjunctivitis: Secondary | ICD-10-CM | POA: Diagnosis not present

## 2015-04-19 DIAGNOSIS — R030 Elevated blood-pressure reading, without diagnosis of hypertension: Secondary | ICD-10-CM | POA: Diagnosis not present

## 2015-04-19 DIAGNOSIS — J3089 Other allergic rhinitis: Secondary | ICD-10-CM | POA: Diagnosis not present

## 2015-04-22 ENCOUNTER — Ambulatory Visit: Payer: Medicare Other | Attending: Nurse Practitioner

## 2015-04-22 ENCOUNTER — Other Ambulatory Visit: Payer: Self-pay | Admitting: Nurse Practitioner

## 2015-04-22 DIAGNOSIS — Z9889 Other specified postprocedural states: Secondary | ICD-10-CM

## 2015-04-22 DIAGNOSIS — Z86000 Personal history of in-situ neoplasm of breast: Secondary | ICD-10-CM

## 2015-04-22 DIAGNOSIS — Z1231 Encounter for screening mammogram for malignant neoplasm of breast: Secondary | ICD-10-CM

## 2015-05-06 DIAGNOSIS — E119 Type 2 diabetes mellitus without complications: Secondary | ICD-10-CM | POA: Diagnosis not present

## 2015-06-03 ENCOUNTER — Encounter (INDEPENDENT_AMBULATORY_CARE_PROVIDER_SITE_OTHER): Payer: Self-pay | Admitting: Internal Medicine

## 2015-06-18 DIAGNOSIS — E663 Overweight: Secondary | ICD-10-CM | POA: Insufficient documentation

## 2015-07-05 DIAGNOSIS — E119 Type 2 diabetes mellitus without complications: Secondary | ICD-10-CM | POA: Diagnosis not present

## 2015-07-05 DIAGNOSIS — Z6826 Body mass index (BMI) 26.0-26.9, adult: Secondary | ICD-10-CM | POA: Diagnosis not present

## 2015-07-05 DIAGNOSIS — E663 Overweight: Secondary | ICD-10-CM | POA: Diagnosis not present

## 2015-07-07 ENCOUNTER — Other Ambulatory Visit (INDEPENDENT_AMBULATORY_CARE_PROVIDER_SITE_OTHER): Payer: Self-pay

## 2015-07-07 ENCOUNTER — Other Ambulatory Visit (INDEPENDENT_AMBULATORY_CARE_PROVIDER_SITE_OTHER): Payer: Self-pay | Admitting: Internal Medicine

## 2015-07-07 DIAGNOSIS — Z1211 Encounter for screening for malignant neoplasm of colon: Secondary | ICD-10-CM

## 2015-07-07 LAB — OCCULT BLOOD BY IA, STL: Occult Blood (1), Stool: NEGATIVE

## 2015-07-07 NOTE — Progress Notes (Signed)
ERROR

## 2015-07-13 ENCOUNTER — Ambulatory Visit (INDEPENDENT_AMBULATORY_CARE_PROVIDER_SITE_OTHER): Payer: Medicare Other | Admitting: Family Medicine

## 2015-07-13 ENCOUNTER — Encounter (INDEPENDENT_AMBULATORY_CARE_PROVIDER_SITE_OTHER): Payer: Self-pay | Admitting: Family Medicine

## 2015-07-13 VITALS — BP 121/80 | HR 75 | Temp 97.0°F | Resp 16

## 2015-07-13 DIAGNOSIS — F4323 Adjustment disorder with mixed anxiety and depressed mood: Secondary | ICD-10-CM

## 2015-07-13 DIAGNOSIS — Z853 Personal history of malignant neoplasm of breast: Secondary | ICD-10-CM

## 2015-07-13 DIAGNOSIS — R5382 Chronic fatigue, unspecified: Secondary | ICD-10-CM

## 2015-07-13 DIAGNOSIS — R002 Palpitations: Secondary | ICD-10-CM

## 2015-07-13 LAB — CBC, DIFF
% Basophils: 1 %
% Eosinophils: 8 %
% Immature Granulocytes: 0 %
% Lymphocytes: 34 %
% Monocytes: 9 %
% Neutrophils: 48 %
% Nucleated RBC: 0 %
Absolute Eosinophil Count: 0.52 10*3/uL — ABNORMAL HIGH (ref 0.00–0.50)
Absolute Lymphocyte Count: 2.37 10*3/uL (ref 1.00–4.80)
Basophils: 0.04 10*3/uL (ref 0.00–0.20)
Hematocrit: 40 % (ref 36–45)
Hemoglobin: 13.5 g/dL (ref 11.5–15.5)
Immature Granulocytes: 0.01 10*3/uL (ref 0.00–0.05)
MCH: 33.3 pg (ref 27.3–33.6)
MCHC: 34.1 g/dL (ref 32.2–36.5)
MCV: 98 fL (ref 81–98)
Monocytes: 0.64 10*3/uL (ref 0.00–0.80)
Neutrophils: 3.33 10*3/uL (ref 1.80–7.00)
Nucleated RBC: 0 10*3/uL
Platelet Count: 213 10*3/uL (ref 150–400)
RBC: 4.06 10*6/uL (ref 3.80–5.00)
RDW-CV: 12.5 % (ref 11.6–14.4)
WBC: 6.91 10*3/uL (ref 4.3–10.0)

## 2015-07-13 LAB — COMPREHENSIVE METABOLIC PANEL
ALT (GPT): 21 U/L (ref 7–33)
AST (GOT): 28 U/L (ref 9–38)
Albumin: 3.6 g/dL (ref 3.5–5.2)
Alkaline Phosphatase (Total): 50 U/L (ref 38–172)
Anion Gap: 7 (ref 4–12)
Bilirubin (Total): 0.5 mg/dL (ref 0.2–1.3)
Calcium: 9.8 mg/dL (ref 8.9–10.2)
Carbon Dioxide, Total: 29 meq/L (ref 22–32)
Chloride: 103 meq/L (ref 98–108)
Creatinine: 0.71 mg/dL (ref 0.38–1.02)
GFR, Calc, African American: >60 mL/min/{1.73_m2}
GFR, Calc, European American: >60 mL/min/{1.73_m2}
Glucose: 87 mg/dL (ref 62–125)
Potassium: 4.6 meq/L (ref 3.6–5.2)
Protein (Total): 6.6 g/dL (ref 6.0–8.2)
Sodium: 139 meq/L (ref 135–145)
Urea Nitrogen: 18 mg/dL (ref 8–21)

## 2015-07-13 LAB — LAB ADD ON ORDER

## 2015-07-13 LAB — THYROID STIMULATING HORMONE: Thyroid Stimulating Hormone: 2.057 u[IU]/mL (ref 0.400–5.000)

## 2015-07-13 LAB — VITAMIN B12 (COBALAMIN): Vitamin B12 (Cobalamin): 1051 pg/mL — ABNORMAL HIGH (ref 180–914)

## 2015-07-13 NOTE — Progress Notes (Addendum)
Paula Bowman is a 65 year old female here for:        1) Fatigue /Palpitations/Depression: Energy is at 50% of her usual. The energy has been low like this for a year, since retirement. She has a fogginess in her head.  She feels that she is getting depressed. She has an antidepressant Imipramine that she was on for years. She started back on this a month ago, but she started having heart palpitations on this medication. She had had palpitations in the past when coming off of the imipramine. This was a daily thing for a week and has stopped. She feels "like crap" all the time. She goes to bed at 7:50 to 5am. From the first thing in the morning she feels aweful. She notes swelling under the eyes. Weight fluctuates with diarrhea. She got down to 110lb. She has been diagnosed with microscopic colitis: she is on Colestipol for this. She wonders if she could be having side effects of Colestipol. She had been on Nexium for a while. She notes this is a possible cause for her colon problems. She is not able to take Ibuprofen.    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 Next check in 3 years.     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 .    Right breast cancer with radiation therapy. She has a growing lump there, but recently had imaging (within the last two months) and was told that the lump is due to benign scarring.      ROS: 14 system review is negative except as above in HPI except for the following:   Fatigue, dry eyes, nasal drainage, diarrhea, tilting sensation, hay fever. She changes the bedding once a week. 2 birds and two dogs. She feels that she is starting to get arthritis in the left hand.       Review of patient's allergies indicates:  Allergies   Allergen Reactions   . Amoxicillin Rash   . Levofloxacin Itching     Redness up arm from IV dose   . Penicillin G Rash   . Terbinafine Hcl      Loss of sense of taste       Meds:  listed at end.    Past Medical  History:   Diagnosis Date   . Esophageal reflux    . Malignant neoplasm of breast (female), unspecified site [facility]) 2008    lumpectomy: Stage 0; Radiation; was ERP; She did not complete Tamoxifen due to side effects x1 yr.    . Microscopic colitis      Past Surgical History:   Procedure Laterality Date   . BREAST LUMPECTOMY     . COLONOSCOPY  2006    [place] , FU 10 years   . FEMUR/KNEE SURG UNLISTED Right 1969   . HERNIA REPAIR     . Knee, R knee at age 23, removed floating bone     . TOTAL ABDOM HYSTERECTOMY      for fibroids   . VAGINAL HYSTERECTOMY  1994    Ovaries in place     Family History   Problem Relation Age of Onset   . Cancer Sister      Breast cancer at 42. Was not tested for BRCA   . Colon Cancer No Hx Of      Social History   Substance Use Topics   . Smoking status: Former Smoker     Packs/day: 2.00  Years: 15.00     Types: Cigarettes     Quit date: 01/07/1988   . Smokeless tobacco: Never Used   . Alcohol use 0.5 oz/week     1 Standard drinks or equivalent per week      Comment: 2 glasses of wine daily       PE:   GEN: alert, mild distress,   SKIN: notable for solar changes. The vertex MOHS site is healing well.   HEAD: Normocephalic. No masses, lesions, tenderness or abnormalities  EYES: Lids/periorbital skin normal.,Conjunctivae/corneas clear.,PERRL.,EOM's intact.  EARS: External ears normal. Canals clear. TM's normal.  NOSE:normal  OROPHARYNX: Lips, mucosa, and tongue normal. Teeth and gums normal.  NECK: Neck supple. No adenopathy. Thyroid symmetric, normal size, without nodules  LUNGS: clear to auscultation  HEART: regular rate and rhythm,no murmurs, clicks, or gallops  BREASTS:  There is no supra or infra clavicular or axillary lymphadenopathy.  The breasts are pendulous and symmetric with normal skin pigmentation.  The patient was initially examined in a seated position.  There is minimal right superior breast dimpling. There is minimal retraction with the patient's hands on her hips or  elevated above the head one at a time and both together.  With the patient in a supine position, breasts were  palpated in all quadrants, under the nipples, and into the axillary tail.  The consistency of the tissue was within normal limits except for the right superior breast where there are changes to the skin c/w past radiation therapy and the underlying mass of 3-4cm.     ABD: Abdomen soft, non-tender. BS normal. No masses or organomegaly  GU: External genitalia normal, normal bartholin/skene/urethral meatus/anus., no bladder tenderness, no adnexal masses or tenderness,   EXT: Normal, without deformities, edema, or skin discoloration,radial and DP pulses 2+ bilaterally  NEURO: Cranial nerves 2-12 intact,strength intact all four extremities,sensation intact to soft touch all four extremities,patellar and biceps brachii reflexes 2+ bilaterally,gait normal.   AFFECT: Depressed and somewhat anxious.    Office Visit on 07/13/15   1. COMPREHENSIVE METABOLIC PANEL   Result Value Ref Range    Sodium 139 135 - 145 meq/L    Potassium 4.6 3.6 - 5.2 meq/L    Chloride 103 98 - 108 meq/L    Carbon Dioxide, Total 29 22 - 32 meq/L    Anion Gap 7 4 - 12    Glucose 87 62 - 125 mg/dL    Urea Nitrogen 18 8 - 21 mg/dL    Creatinine 0.71 0.38 - 1.02 mg/dL    Protein (Total) 6.6 6.0 - 8.2 g/dL    Albumin 3.6 3.5 - 5.2 g/dL    Bilirubin (Total) 0.5 0.2 - 1.3 mg/dL    Calcium 9.8 8.9 - 10.2 mg/dL    AST (GOT) 28 9 - 38 U/L    Alkaline Phosphatase (Total) 50 38 - 172 U/L    ALT (GPT) 21 7 - 33 U/L    GFR, Calc, European American >60 mL/min/[1.73_m2]    GFR, Calc, African American >60 mL/min/[1.73_m2]    GFR, Information       Calculated GFR in mL/min/1.73 m2 by MDRD equation.  Inaccurate with changing renal function.  See http://[email or web address]   2. CBC, DIFF   Result Value Ref Range    WBC 6.91 4.3 - 10.0 10*3/uL    RBC 4.06 3.80 - 5.00 10*6/uL    Hemoglobin 13.5 11.5 - 15.5 g/dL    Hematocrit 40 36 - 45  %  MCV 98 81 - 98 fL    MCH 33.3 27.3 - 33.6 pg    MCHC 34.1 32.2 - 36.5 g/dL    Platelet Count 213 150 - 400 10*3/uL    RDW-CV 12.5 11.6 - 14.4 %    % Neutrophils 48 %    % Lymphocytes 34 %    % Monocytes 9 %    % Eosinophils 8 %    % Basophils 1 %    % Immature Granulocytes 0 %    Neutrophils 3.33 1.80 - 7.00 10*3/uL    Absolute Lymphocyte Count 2.37 1.00 - 4.80 10*3/uL    Monocytes 0.64 0.00 - 0.80 10*3/uL    Absolute Eosinophil Count 0.52 (H) 0.00 - 0.50 10*3/uL    Basophils 0.04 0.00 - 0.20 10*3/uL    Immature Granulocytes 0.01 0.00 - 0.05 10*3/uL    Nucleated RBC 0.00 0.00 10*3/uL    % Nucleated RBC 0 %         EKG:  (On my read) Normal sinus rhythm, rate Sinus 65; axis 60 degrees; intervals: .13/.08/ and .40;  Abnormal P axis. On rhythm strip there appeared to be more than one p wave morphology suggesting the possibility of a wandering atrial pacemaker. Borderline EKG.       Encounter Diagnoses   Name Primary?   . Chronic fatigue    . Palpitations Yes   . Adjustment disorder with mixed anxiety and depressed mood      She cannot take TCAs due to palpitations-and they can be arrythmogenic. Consider referral to cardiology for cardiac monitoring if having recurrent symptoms.     She cannot take SSRIs as she has side effects with these medications as well. She has not tried Bupropion. This was offered, but she declined. 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     She was offered counseling, but was not ready to pursue a referral.     Lab testing will be repeated at her request.

## 2015-07-13 NOTE — Patient Instructions (Addendum)
Stepped pain management for osteoarthritis.   1)  Topicals: Capsaicin, Asper cream; Camphor (Ben Gay); 2)  Glucosamine Complex three times daily for at least one month.  3)  Tylenol 2-500mg  every 6 hours;       Colestipol:   Adverse Reactions   Frequency not defined.  Cardiovascular: Angina, chest pain, peripheral edema, tachycardia  Central nervous system: Dizziness, fatigue, headache (including migraine and sinus headache), insomnia  Dermatologic: Dermatitis, skin rash, urticaria  Gastrointestinal: Abdominal cramps, abdominal pain, anorexia, bloating, constipation, cholecystitis, cholelithiasis, diarrhea, dyspepsia, dysphagia, esophageal obstruction, flatulence, heartburn, hemorrhoidal bleeding, nausea, peptic ulcer, vomiting  Hepatic: Increased serum alkaline phosphatase, increased serum ALT, increased serum AST  Neuromuscular & skeletal: Arthralgia, arthritis, back pain, myalgia, weakness  Respiratory: Dyspnea      Consider trial of Bupropion

## 2015-07-13 NOTE — Progress Notes (Signed)
Vitals,Medications,and Allergies reviewed by  Yetta Barre, 07/13/2015 10:24 AM        Reason for visit: fatigue , fogginess, body aches ,        Have you seen a specialist since your last visit: YES     If  Name and location and date. Gastro Dr.Leeung      HM Due:   Health Maintenance   Topic Date Due   . Pneumococcal Vaccine (2 of 2 - PPSV23) 04/10/2015   . Influenza Vaccine (Season Ended) 10/22/2015   . Mammogram  04/21/2017   . Cholesterol Test  02/03/2018   . Tetanus Vaccine  11/28/2023   . Colonoscopy  09/14/2024   . Zoster Vaccine  Completed   . DEXA Scan  Completed   . Hepatitis C Screen  Addressed   . HIV Screen  Addressed

## 2015-07-13 NOTE — Addendum Note (Signed)
Addended by: Lanny Cramp on: 07/13/2015 04:30 PM     Modules accepted: Orders

## 2015-08-09 ENCOUNTER — Ambulatory Visit (INDEPENDENT_AMBULATORY_CARE_PROVIDER_SITE_OTHER): Payer: Medicare Other | Admitting: Family Medicine

## 2015-08-09 ENCOUNTER — Encounter (INDEPENDENT_AMBULATORY_CARE_PROVIDER_SITE_OTHER): Payer: Self-pay | Admitting: Family Medicine

## 2015-08-09 VITALS — BP 121/82 | HR 64 | Temp 97.9°F | Resp 14 | Wt 115.0 lb

## 2015-08-09 DIAGNOSIS — S80211A Abrasion, right knee, initial encounter: Secondary | ICD-10-CM

## 2015-08-09 DIAGNOSIS — S8002XA Contusion of left knee, initial encounter: Secondary | ICD-10-CM

## 2015-08-09 NOTE — Progress Notes (Signed)
The above nursing note is reviewed and acknowledged by Buelah Manis, MD    SUBJECTIVE:    Paula Bowman is a 65 year old female who presents with pain to right and left knees.    ONSET: 1 week ago.     Pt states that the mechanisms of injury is tripped and fell forward landing on knees and head while in [place].  Taken to ER for evaluation - no neurologic problems after exam and patient states they didn't X-Ray or fully evaluate both knees for pain afterward.  .   Immediate symptoms: immediate pain to both knees.   Right knee with abrasions and diffuse pain with ambulation - prior Total knee to that knee.  Left knee medial point tender with underlying sharp pain and overlying bruise.       Pt describes pain as Severity:  moderate    At the time of the injury Pt did not feel/hear a snap or a pop.     Pt does not have buckling of knee joint  Pt is now able to bear weight.    Pt is not limping with ambulation\ but does have discomfort walking    Prior history of related problems: as above.    Outpatient Medications Prior to Visit   Medication Sig Dispense Refill   . ALPRAZolam (XANAX OR)      . Colestipol HCl 1 G Oral Tab Take 1 tablet by mouth daily.     . CycloSPORINE 0.05 % Ophthalmic Emulsion Place 1 drop in each EYE every 12 hours.     . Esomeprazole Magnesium (NEXIUM) 40 MG Oral CAPSULE DELAYED RELEASE One every other day for acid reflux     . Fluticasone Propionate 50 MCG/ACT Nasal Suspension Spray 2 sprays into each nostril daily. For prevention/control of nasal/sinus congestion 3 Inhaler 3   . Ipratropium Bromide 0.06 % Nasal Solution Spray 2 sprays into each nostril daily. 1 bottle 2   . Olopatadine HCl 0.1 % Ophthalmic Solution Place 1 drop in each EYE 2 times a day. as needed (the azelastine is not tolerated)     . TraMADol HCl 50 MG Oral Tab Take 1 tablet (50 mg) by mouth every 6 hours as needed for pain. 10 tablet 0     No facility-administered medications prior to visit.         Current  Outpatient Prescriptions   Medication Sig Dispense Refill   . ALPRAZolam (XANAX OR)      . Colestipol HCl 1 G Oral Tab Take 1 tablet by mouth daily.     . CycloSPORINE 0.05 % Ophthalmic Emulsion Place 1 drop in each EYE every 12 hours.     . Esomeprazole Magnesium (NEXIUM) 40 MG Oral CAPSULE DELAYED RELEASE One every other day for acid reflux     . Fluticasone Propionate 50 MCG/ACT Nasal Suspension Spray 2 sprays into each nostril daily. For prevention/control of nasal/sinus congestion 3 Inhaler 3   . Ipratropium Bromide 0.06 % Nasal Solution Spray 2 sprays into each nostril daily. 1 bottle 2   . Olopatadine HCl 0.1 % Ophthalmic Solution Place 1 drop in each EYE 2 times a day. as needed (the azelastine is not tolerated)     . TraMADol HCl 50 MG Oral Tab Take 1 tablet (50 mg) by mouth every 6 hours as needed for pain. 10 tablet 0     No current facility-administered medications for this visit.         Review  of patient's allergies indicates:  Allergies   Allergen Reactions   . Amoxicillin Rash   . Levofloxacin Itching     Redness up arm from IV dose   . Penicillin G Rash   . Terbinafine Hcl      Loss of sense of taste       OBJECTIVE:  BP 121/82   Pulse 64   Temp 97.9 F (36.6 C) (Oral)   Resp 14   Wt 115 lb (52.2 kg)   SpO2 100%   BMI 17.84 kg/m     General appearance: relaxed, cooperative and healthy,alert / coherent     KNEE EXAM:     Right:     Healing abrasions to lateral knee, no erythema or discharge  EFFUSION:  negative  ECCHYMOSIS: negative  TENDERNESS: positive for distal medial hamstring tenderness with ROM  ACTIVE ROM: Full  PASSIVE ROM: Full  PATELLAR LAXITY: negative  PATELLAR CREPITUS: negative  COLLATERAL LIGAMENTS: negative  LACHMANNS: negative  McMURRAYS: negative    Left:  EFFUSION:  negative  ECCHYMOSIS: 2 cm ecchymotic area of medial knee proximal to joint with point tender to palpation  TENDERNESS: POSITIVE  ACTIVE ROM: limited ROM   PASSIVE ROM: full and painless  PATELLAR LAXITY:  negative  PATELLAR CREPITUS: negative  COLLATERAL LIGAMENTS: negative  LACHMANNS: negative  McMURRAYS: negative    XRAY:  Left knee  Radiographs taken: my initial review indicates No fracture, subluxation or dislocation and This is a preliminary x-ray report - Please see final report    ASSESSMENT:  (S80.02XA) Contusion of knee, left  (primary encounter diagnosis)  [id number]) Abrasion of knee, right, initial encounter      PLAN:    AVS given  OTC analgesics prn and tincture of time  Heat to area, activity as tolerated.  FU with PCP if sx persist or not improving in the next 1-2 weeks.  Return to [facility] in interim prn worsening or concern.      Buelah Manis, MD

## 2015-08-09 NOTE — Patient Instructions (Addendum)
It was a pleasure to see you in clinic today. It was a pleasure to see you in clinic today. Your Psychologist, sport and exercise was [employer]              You can schedule an appointment to see [place] by calling [phone] or via eCare.     If labs were ordered today the results are expected to be available via eCare 5 days later. Otherwise, result letters are mailed 7-10 days after your tests are completed. If your physician needs to change your care based on your results, you will receive a phone call to notify you. If you haven't heard from him/her and it has been more than 10 days please give [place] a call.     Thank you for choosing [facility].       CONTUSIONS:    You have a deep bruise (contusion).  Contusions are areas of tenderness and swelling in the soft tissues.  They are the result of trauma and bleeding in the injured area.  Minor trauma will give you a painless bruise; more severe contusions may stay painful and swollen for a few weeks.      Treatment includes:        Rest the injured area until the pain and swelling are better.   Apply ice packs every few hours for 2-3 days, then moist heat.   Elevate the injury to reduce swelling.   Compression bandages also help reduce swelling and motion.    A hematoma may form in large contusions; this is a collection of blood in the deep tissues.  Hematomas are usually reabsorbed by the body naturally, but sometimes they need to be drained.  Please see your health care provider or go to the emergency room right away if your contusion shows signs of infection (increased redness, swelling, and pain), or if the area becomes numb, cold, blue, or much more painful.

## 2015-08-09 NOTE — Progress Notes (Signed)
Medications, allergies, vitals verified by Aja Bolander/CMA

## 2015-09-09 ENCOUNTER — Encounter (INDEPENDENT_AMBULATORY_CARE_PROVIDER_SITE_OTHER): Payer: Self-pay | Admitting: Family

## 2015-09-09 ENCOUNTER — Ambulatory Visit (INDEPENDENT_AMBULATORY_CARE_PROVIDER_SITE_OTHER): Payer: Medicare Other | Admitting: Family

## 2015-09-09 VITALS — BP 111/72 | HR 83 | Temp 98.0°F | Resp 16 | Wt 116.0 lb

## 2015-09-09 DIAGNOSIS — Z681 Body mass index (BMI) 19 or less, adult: Secondary | ICD-10-CM

## 2015-09-09 DIAGNOSIS — M549 Dorsalgia, unspecified: Secondary | ICD-10-CM

## 2015-09-09 DIAGNOSIS — Z23 Encounter for immunization: Secondary | ICD-10-CM

## 2015-09-09 DIAGNOSIS — M542 Cervicalgia: Secondary | ICD-10-CM

## 2015-09-09 NOTE — Progress Notes (Signed)
Reason for visit: fall injury        Cervical screening/PAP:no  Mammo: no  Colon Screen: no  Have seen a specialist since your last visit: NO  Ayva Veilleux A MIR ABDUL SAMI, MA       HM Due:   Health Maintenance   Topic Date Due   . Pneumococcal Vaccine (2 of 2 - PPSV23) 04/10/2015   . Influenza Vaccine (1) 10/22/2015   . Mammogram  04/21/2017   . Cholesterol Test  02/03/2018   . Tetanus Vaccine  11/28/2023   . Colonoscopy  09/14/2024   . Zoster Vaccine  Completed   . DEXA Scan  Completed   . Hepatitis C Screen  Addressed   . HIV Screen  Addressed           No future appointments.    Vaccine Screening Questions      1.  Have you had a serious reaction or an allergic reaction to a vaccine?  NO    2.  Currently have a moderate or severe illness, including fever? (Don't Ask if vaccine   ordered by provider same day)  NO    3.  Ever had a seizure or a brain or other nervous system problem syndrome associated with a vaccine? (DTaP/TDaP/DTP pertinent) NO    4.  Is patient receiving  any live vaccinations today? (Varicella-Chickenpox, MMR-Measles/Mumps/Rubella, Zoster-Shingles)  NO    If YES to any of the questions above - Do NOT give vaccine.  Consult with RN or provider in clinic.  (#4 can be YES if all Live vaccine questions are answered NO)    If NO to all questions above - Patient may receive vaccine.    5.  Do you need to receive the Flu vaccine today? NO    Vaccine information sheet(s) discussed, patient/parent/guardian verbalized understanding? YES     VIS given 09/09/2015 by [facility] A MIR ABDUL SAMI, MA MA.      Vaccine given today without initial adverse effect. YES    Latrise [facility] [place] Sami CMA       WAIIS checked pt never had PCV23 only had PCV 13, pt wants PCV23 for sure today and this should not count as Default , she states that she only has PCV13 one does and other doses are not correct   Okay per [place], MA

## 2015-09-09 NOTE — Patient Instructions (Signed)
Patient Instructions: Neck Pain    Take Tylenol/ibuprofen as needed for pain relief.   Use ice/heat on back to help relieve pain.  Sleep on a firm mattress.  Improve your posture. Stand and sit straight.  Keep a cushion at the small of the back/neck when sitting in a chair or while driving.  Avoid sudden jerky movements and over activity. To lift objects from floor, kneel do not bend at waist.   Staying active helps, and regular exercises for back/neck muscles keep spasms at bay.   Relaxation techniques, yoga and meditation help.  Consult a doctor in case of severe or persistent pain, or associated fever/chills/lower extremity weakness.  If not improving over the next 4-6 weeks, see provider to consider additional work-up and/or therapy.

## 2015-09-09 NOTE — Progress Notes (Signed)
Patient Referred By: No referring provider defined for this encounter.    Patient's PCP: Leary Roca, MD    Chief Complain:  Eryn is an 65 year old female here for   Chief Complaint   Patient presents with   . Back Pain     pt is here for PT referral for back and neck pain, pt fell in june        The following portions of the patient's history were reviewed with the patient and updated as appropriate: problem list, current medications, allergies and past medical history.    History of Present Illness:  Paula Bowman is a 65 year old female with complaints of left upper back pain and neck pain.    She was travelling up in [place] in June.  She tripped over a piece of wooden fence on a sidewalk; she fell and landed on the sidewalk with her knees and head. She did not lose consciousness.  Since then her neck and upper back have not been the same.  Pain symptoms over the neck and and upper back are not improving.  Describes pain as constant aching pain with pain intensity of 4-5/10.  Pain is worse with activities; better with rest.   Has not tried Ibuprofen/Tylenol, occasionally uses ice/heat with some relief.  Associated symptoms include: stiffness and muscle spasm.    Denies having fever/chills, weight loss, night sweats, weakness, tingling or numbness to upper/lower extremities.  No changes or lose control of bowel/urinary habit.    No prior history of spine surgery/procedures.    Review of Systems:  As per HPI, and all other systems have been reviewed and negative    Objective:  Vitals:    09/09/15 0849   BP: 111/72   Pulse: 83   Resp: 16   Temp: 98 F (36.7 C)   TempSrc: Temporal   SpO2: 99%   Weight: 116 lb (52.6 kg)       Physical Exam:   Physical Exam   Constitutional: She is oriented to person, place, and time and well-developed, well-nourished, and in no distress. No distress.   HENT:   Head: Normocephalic and atraumatic.   Eyes: Conjunctivae and EOM are normal.   Neck: Neck supple.      Cardiovascular: Normal rate, regular rhythm and normal heart sounds.    Pulmonary/Chest: Effort normal and breath sounds normal.   Musculoskeletal: She exhibits tenderness.        Cervical back: She exhibits tenderness and pain. She exhibits normal range of motion, no bony tenderness, no swelling, no edema, no deformity and no laceration.        Thoracic back: She exhibits tenderness, pain and spasm. She exhibits normal range of motion, no bony tenderness, no swelling, no edema, no deformity and no laceration.   Neurological: She is alert and oriented to person, place, and time. Gait normal.   Reflex Scores:       Tricep reflexes are 2+ on the right side and 2+ on the left side.       Bicep reflexes are 2+ on the right side and 2+ on the left side.      Chart data reviewed   Labs data reviewed        Assessment and Plan:    ICD-10-CM ICD-9-CM    1. Upper back pain on left side M54.9 724.5 REFERRAL TO PHYSICAL THERAPY      REFERRAL TO MASSAGE THERAPY   2. Neck pain M54.2 723.1  REFERRAL TO PHYSICAL THERAPY      REFERRAL TO MASSAGE THERAPY   3. Need for vaccination Z23 V05.9 pneumococcal (Pneumovax 23) polysaccharide vacc (adult) 0.5 mL IM or SUBCUTANEOUS - 90732     1. Upper back pain on left side  - Strain of upper back:  likely diagnosis based on exam and history.     Plan:  . Ice/heat as needed  . Limit activity by pain  . Pain control medication:  Ibuprofen (600-810m three times a day) OR naproxen (250-5059mtwo times a day) as needed for pain. (if you cannot take an anti-inflammatory medication due to stomach or kidney, or other reasons, then use Tylenol as needed).  . Advised patient to follow up with physical therapy and massage therapy for treatment.  . Sleep on a firm mattress.  . Improve your posture. Stand and sit straight.  . Keep a cushion at the small of the back when sitting in a chair or while driving.  . Avoid sudden jerky movements and over activity. To lift objects from floor, kneel do not bend.  Or bend at the knees and not at the waist.  . Regular exercise for back muscles keep spasms at bay.   . Relaxation techniques, yoga and meditation help.   . If not improving over the next 4-6 weeks, or worsening, see provider for repeat evaluation/further work-up.    - REFERRAL TO PHYSICAL THERAPY  - REFERRAL TO MASSAGE THERAPY    2. Neck pain  - See above plan.  - REFERRAL TO PHYSICAL THERAPY  - REFERRAL TO MASSAGE THERAPY    3. Need for vaccination    - pneumococcal (Pneumovax 23) polysaccharide vacc (adult) 0.5 mL IM or SUBCUTANEOUS - [id number]  Follow Up:  Patient was instructed to follow up as needed, or sooner if worsening, or developing new symptoms.    Above plan discussed in detail with patient/family, and questions addressed, and patient agrees with plan.  Side effects and risks of all medications discussed.  Patient will call/contact me if any problems arise.      The following portions of the patient's history were reviewed with the patient and updated as appropriate: problem list, current medications, allergies, past medical history, past surgical history, past social history and past family history.    Patient Active Problem List   Diagnosis   . Personal history of malignant neoplasm of breast   . Anxiety state, unspecified   . Other chronic allergic conjunctivitis   . GERD (gastroesophageal reflux disease)   . Eczema   . Allergic rhinitis   . Onychomycosis   . Back pain   . 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 Neck pain   . Need for prophylactic vaccination with combined diphtheria-tetanus-pertussis (DTP) vaccine   . Radiculopathy of cervical region   . Heat exhaustion   . Dry eye       Outpatient Medications Prior to Visit   Medication Sig Dispense Refill   . ALPRAZolam (XANAX OR)      . Colestipol HCl 1 G Oral Tab Take 1 tablet by mouth daily.     . CycloSPORINE 0.05 % Ophthalmic Emulsion Place 1 drop in each EYE every 12 hours.     . Esomeprazole Magnesium (NEXIUM) 40 MG  Oral CAPSULE DELAYED RELEASE One every other day for acid reflux     . Fluticasone Propionate 50 MCG/ACT Nasal Suspension Spray 2 sprays into each nostril daily. For  prevention/control of nasal/sinus congestion 3 Inhaler 3   . Ipratropium Bromide 0.06 % Nasal Solution Spray 2 sprays into each nostril daily. 1 bottle 2   . Olopatadine HCl 0.1 % Ophthalmic Solution Place 1 drop in each EYE 2 times a day. as needed (the azelastine is not tolerated)     . TraMADol HCl 50 MG Oral Tab Take 1 tablet (50 mg) by mouth every 6 hours as needed for pain. 10 tablet 0     No facility-administered medications prior to visit.        No results found for any visits on 09/09/15.    Past Medical History:   Diagnosis Date   . Esophageal reflux    . Malignant neoplasm of breast (female), unspecified site [facility]) 2008    lumpectomy: Stage 0; Radiation; was ERP; She did not complete Tamoxifen due to side effects x1 yr.    . Microscopic colitis        Social History     Social History   . Marital status: Soil scientist     Spouse name: N/A   . Number of children: N/A   . Years of education: N/A     Occupational History   . Not on file.     Social History Main Topics   . Smoking status: Former Smoker     Packs/day: 2.00     Years: 15.00     Types: Cigarettes     Quit date: 01/07/1988   . Smokeless tobacco: Never Used   . Alcohol use 0.5 oz/week     1 Standard drinks or equivalent per week      Comment: 2 glasses of wine daily   . Drug use: No   . Sexual activity: Yes     Partners: Female     Other Topics Concern   . 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 Cancer Sister      Breast cancer at 43. Was not tested for BRCA   . Colon Cancer No Hx Of

## 2015-09-28 ENCOUNTER — Other Ambulatory Visit (INDEPENDENT_AMBULATORY_CARE_PROVIDER_SITE_OTHER): Payer: Medicare Other

## 2015-09-28 ENCOUNTER — Encounter: Payer: Self-pay | Admitting: Internal Medicine

## 2015-09-28 ENCOUNTER — Ambulatory Visit (INDEPENDENT_AMBULATORY_CARE_PROVIDER_SITE_OTHER): Payer: Medicare Other | Admitting: Internal Medicine

## 2015-09-28 VITALS — BP 122/84 | HR 70 | Ht 70.0 in | Wt 193.0 lb

## 2015-09-28 DIAGNOSIS — Z0000 Encounter for general adult medical examination without abnormal findings: Secondary | ICD-10-CM

## 2015-09-28 DIAGNOSIS — Z23 Encounter for immunization: Secondary | ICD-10-CM

## 2015-09-28 DIAGNOSIS — Z794 Long term (current) use of insulin: Secondary | ICD-10-CM

## 2015-09-28 DIAGNOSIS — E785 Hyperlipidemia, unspecified: Secondary | ICD-10-CM

## 2015-09-28 DIAGNOSIS — I10 Essential (primary) hypertension: Secondary | ICD-10-CM

## 2015-09-28 DIAGNOSIS — E119 Type 2 diabetes mellitus without complications: Secondary | ICD-10-CM

## 2015-09-28 DIAGNOSIS — Z1211 Encounter for screening for malignant neoplasm of colon: Secondary | ICD-10-CM

## 2015-09-28 LAB — CBC WITH DIFFERENTIAL/PLATELET
BASOS ABS: 0 10*3/uL (ref 0.0–0.1)
Basophils Relative: 0.8 % (ref 0.0–3.0)
Eosinophils Absolute: 0.2 10*3/uL (ref 0.0–0.7)
Eosinophils Relative: 3.7 % (ref 0.0–5.0)
HCT: 40.4 % (ref 36.0–46.0)
HEMOGLOBIN: 13.8 g/dL (ref 12.0–15.0)
LYMPHS PCT: 38.7 % (ref 12.0–46.0)
Lymphs Abs: 2.3 10*3/uL (ref 0.7–4.0)
MCHC: 34.2 g/dL (ref 30.0–36.0)
MCV: 82.7 fl (ref 78.0–100.0)
Monocytes Absolute: 0.4 10*3/uL (ref 0.1–1.0)
Monocytes Relative: 6.2 % (ref 3.0–12.0)
Neutro Abs: 3.1 10*3/uL (ref 1.4–7.7)
Neutrophils Relative %: 50.6 % (ref 43.0–77.0)
PLATELETS: 300 10*3/uL (ref 150.0–400.0)
RBC: 4.89 Mil/uL (ref 3.87–5.11)
RDW: 13.4 % (ref 11.5–15.5)
WBC: 6.1 10*3/uL (ref 4.0–10.5)

## 2015-09-28 LAB — BASIC METABOLIC PANEL
BUN: 15 mg/dL (ref 6–23)
CALCIUM: 9.3 mg/dL (ref 8.4–10.5)
CO2: 28 mEq/L (ref 19–32)
Chloride: 106 mEq/L (ref 96–112)
Creatinine, Ser: 0.66 mg/dL (ref 0.40–1.20)
GFR: 95.39 mL/min (ref >60.00)
Glucose, Bld: 122 mg/dL — ABNORMAL HIGH (ref 70–99)
Potassium: 4.8 mEq/L (ref 3.5–5.1)
Sodium: 141 mEq/L (ref 135–145)

## 2015-09-28 LAB — HEMOGLOBIN A1C: Hgb A1c MFr Bld: 7.8 % — ABNORMAL HIGH (ref 4.6–6.5)

## 2015-09-28 LAB — URINALYSIS, ROUTINE W REFLEX MICROSCOPIC
Bilirubin Urine: NEGATIVE
KETONES UR: NEGATIVE
NITRITE: NEGATIVE
URINE GLUCOSE: NEGATIVE
UROBILINOGEN UA: 0.2 (ref 0.0–1.0)
pH: 5.5 (ref 5.0–8.0)

## 2015-09-28 LAB — LIPID PANEL
CHOLESTEROL: 257 mg/dL — AB (ref 0–200)
HDL: 48.6 mg/dL (ref >39.00)
LDL CALC: 177 mg/dL — AB (ref 0–99)
NonHDL: 208.28
TRIGLYCERIDES: 156 mg/dL — AB (ref 0.0–149.0)
Total CHOL/HDL Ratio: 5
VLDL: 31.2 mg/dL (ref 0.0–40.0)

## 2015-09-28 LAB — HEPATIC FUNCTION PANEL
ALK PHOS: 94 U/L (ref 39–117)
ALT: 21 U/L (ref 0–35)
AST: 18 U/L (ref 0–37)
Albumin: 3.9 g/dL (ref 3.5–5.2)
BILIRUBIN DIRECT: 0.1 mg/dL (ref 0.0–0.3)
TOTAL PROTEIN: 7.4 g/dL (ref 6.0–8.3)
Total Bilirubin: 0.3 mg/dL (ref 0.2–1.2)

## 2015-09-28 LAB — MICROALBUMIN / CREATININE URINE RATIO
Creatinine,U: 158 mg/dL
MICROALB UR: 6.8 mg/dL — AB (ref 0.0–1.9)
MICROALB/CREAT RATIO: 4.3 mg/g (ref 0.0–30.0)

## 2015-09-28 LAB — TSH: TSH: 2.62 u[IU]/mL (ref 0.35–4.50)

## 2015-09-28 MED ORDER — BISACODYL 5 MG PO TBEC: 5.0000 mg | DELAYED_RELEASE_TABLET | Freq: Every day | ORAL | 3 refills | Status: DC | PRN

## 2015-09-28 MED ORDER — ALPRAZOLAM 1 MG PO TABS: 0.5000 mg | ORAL_TABLET | Freq: Two times a day (BID) | ORAL | 3 refills | Status: DC | PRN

## 2015-09-28 NOTE — Patient Instructions (Signed)
Preventive Care for Adults, Female A healthy lifestyle and preventive care can promote health and wellness. Preventive health guidelines for women include the following key practices.  A routine yearly physical is a good way to check with your health care provider about your health and preventive screening. It is a chance to share any concerns and updates on your health and to receive a thorough exam.  Visit your dentist for a routine exam and preventive care every 6 months. Brush your teeth twice a day and floss once a day. Good oral hygiene prevents tooth decay and gum disease.  The frequency of eye exams is based on your age, health, family medical history, use of contact lenses, and other factors. Follow your health care provider's recommendations for frequency of eye exams.  Eat a healthy diet. Foods like vegetables, fruits, whole grains, low-fat dairy products, and lean protein foods contain the nutrients you need without too many calories. Decrease your intake of foods high in solid fats, added sugars, and salt. Eat the right amount of calories for you.Get information about a proper diet from your health care provider, if necessary.  Regular physical exercise is one of the most important things you can do for your health. Most adults should get at least 150 minutes of moderate-intensity exercise (any activity that increases your heart rate and causes you to sweat) each week. In addition, most adults need muscle-strengthening exercises on 2 or more days a week.  Maintain a healthy weight. The body mass index (BMI) is a screening tool to identify possible weight problems. It provides an estimate of body fat based on height and weight. Your health care provider can find your BMI and can help you achieve or maintain a healthy weight.For adults 20 years and older:  A BMI below 18.5 is considered underweight.  A BMI of 18.5 to 24.9 is normal.  A BMI of 25 to 29.9 is considered overweight.  A  BMI of 30 and above is considered obese.  Maintain normal blood lipids and cholesterol levels by exercising and minimizing your intake of saturated fat. Eat a balanced diet with plenty of fruit and vegetables. Blood tests for lipids and cholesterol should begin at age 45 and be repeated every 5 years. If your lipid or cholesterol levels are high, you are over 50, or you are at high risk for heart disease, you may need your cholesterol levels checked more frequently.Ongoing high lipid and cholesterol levels should be treated with medicines if diet and exercise are not working.  If you smoke, find out from your health care provider how to quit. If you do not use tobacco, do not start.  Lung cancer screening is recommended for adults aged 45-80 years who are at high risk for developing lung cancer because of a history of smoking. A yearly low-dose CT scan of the lungs is recommended for people who have at least a 30-pack-year history of smoking and are a current smoker or have quit within the past 15 years. A pack year of smoking is smoking an average of 1 pack of cigarettes a day for 1 year (for example: 1 pack a day for 30 years or 2 packs a day for 15 years). Yearly screening should continue until the smoker has stopped smoking for at least 15 years. Yearly screening should be stopped for people who develop a health problem that would prevent them from having lung cancer treatment.  If you are pregnant, do not drink alcohol. If you are  breastfeeding, be very cautious about drinking alcohol. If you are not pregnant and choose to drink alcohol, do not have more than 1 drink per day. One drink is considered to be 12 ounces (355 mL) of beer, 5 ounces (148 mL) of wine, or 1.5 ounces (44 mL) of liquor.  Avoid use of street drugs. Do not share needles with anyone. Ask for help if you need support or instructions about stopping the use of drugs.  High blood pressure causes heart disease and increases the risk  of stroke. Your blood pressure should be checked at least every 1 to 2 years. Ongoing high blood pressure should be treated with medicines if weight loss and exercise do not work.  If you are 55-79 years old, ask your health care provider if you should take aspirin to prevent strokes.  Diabetes screening is done by taking a blood sample to check your blood glucose level after you have not eaten for a certain period of time (fasting). If you are not overweight and you do not have risk factors for diabetes, you should be screened once every 3 years starting at age 45. If you are overweight or obese and you are 40-70 years of age, you should be screened for diabetes every year as part of your cardiovascular risk assessment.  Breast cancer screening is essential preventive care for women. You should practice "breast self-awareness." This means understanding the normal appearance and feel of your breasts and may include breast self-examination. Any changes detected, no matter how small, should be reported to a health care provider. Women in their 20s and 30s should have a clinical breast exam (CBE) by a health care provider as part of a regular health exam every 1 to 3 years. After age 40, women should have a CBE every year. Starting at age 40, women should consider having a mammogram (breast X-ray test) every year. Women who have a family history of breast cancer should talk to their health care provider about genetic screening. Women at a high risk of breast cancer should talk to their health care providers about having an MRI and a mammogram every year.  Breast cancer gene (BRCA)-related cancer risk assessment is recommended for women who have family members with BRCA-related cancers. BRCA-related cancers include breast, ovarian, tubal, and peritoneal cancers. Having family members with these cancers may be associated with an increased risk for harmful changes (mutations) in the breast cancer genes BRCA1 and  BRCA2. Results of the assessment will determine the need for genetic counseling and BRCA1 and BRCA2 testing.  Your health care provider may recommend that you be screened regularly for cancer of the pelvic organs (ovaries, uterus, and vagina). This screening involves a pelvic examination, including checking for microscopic changes to the surface of your cervix (Pap test). You may be encouraged to have this screening done every 3 years, beginning at age 21.  For women ages 30-65, health care providers may recommend pelvic exams and Pap testing every 3 years, or they may recommend the Pap and pelvic exam, combined with testing for human papilloma virus (HPV), every 5 years. Some types of HPV increase your risk of cervical cancer. Testing for HPV may also be done on women of any age with unclear Pap test results.  Other health care providers may not recommend any screening for nonpregnant women who are considered low risk for pelvic cancer and who do not have symptoms. Ask your health care provider if a screening pelvic exam is right for   you.  If you have had past treatment for cervical cancer or a condition that could lead to cancer, you need Pap tests and screening for cancer for at least 20 years after your treatment. If Pap tests have been discontinued, your risk factors (such as having a new sexual partner) need to be reassessed to determine if screening should resume. Some women have medical problems that increase the chance of getting cervical cancer. In these cases, your health care provider may recommend more frequent screening and Pap tests.  Colorectal cancer can be detected and often prevented. Most routine colorectal cancer screening begins at the age of 50 years and continues through age 75 years. However, your health care provider may recommend screening at an earlier age if you have risk factors for colon cancer. On a yearly basis, your health care provider may provide home test kits to check  for hidden blood in the stool. Use of a small camera at the end of a tube, to directly examine the colon (sigmoidoscopy or colonoscopy), can detect the earliest forms of colorectal cancer. Talk to your health care provider about this at age 50, when routine screening begins. Direct exam of the colon should be repeated every 5-10 years through age 75 years, unless early forms of precancerous polyps or small growths are found.  People who are at an increased risk for hepatitis B should be screened for this virus. You are considered at high risk for hepatitis B if:  You were born in a country where hepatitis B occurs often. Talk with your health care provider about which countries are considered high risk.  Your parents were born in a high-risk country and you have not received a shot to protect against hepatitis B (hepatitis B vaccine).  You have HIV or AIDS.  You use needles to inject street drugs.  You live with, or have sex with, someone who has hepatitis B.  You get hemodialysis treatment.  You take certain medicines for conditions like cancer, organ transplantation, and autoimmune conditions.  Hepatitis C blood testing is recommended for all people born from 1945 through 1965 and any individual with known risks for hepatitis C.  Practice safe sex. Use condoms and avoid high-risk sexual practices to reduce the spread of sexually transmitted infections (STIs). STIs include gonorrhea, chlamydia, syphilis, trichomonas, herpes, HPV, and human immunodeficiency virus (HIV). Herpes, HIV, and HPV are viral illnesses that have no cure. They can result in disability, cancer, and death.  You should be screened for sexually transmitted illnesses (STIs) including gonorrhea and chlamydia if:  You are sexually active and are younger than 24 years.  You are older than 24 years and your health care provider tells you that you are at risk for this type of infection.  Your sexual activity has changed  since you were last screened and you are at an increased risk for chlamydia or gonorrhea. Ask your health care provider if you are at risk.  If you are at risk of being infected with HIV, it is recommended that you take a prescription medicine daily to prevent HIV infection. This is called preexposure prophylaxis (PrEP). You are considered at risk if:  You are sexually active and do not regularly use condoms or know the HIV status of your partner(s).  You take drugs by injection.  You are sexually active with a partner who has HIV.  Talk with your health care provider about whether you are at high risk of being infected with HIV. If   you choose to begin PrEP, you should first be tested for HIV. You should then be tested every 3 months for as long as you are taking PrEP.  Osteoporosis is a disease in which the bones lose minerals and strength with aging. This can result in serious bone fractures or breaks. The risk of osteoporosis can be identified using a bone density scan. Women ages 67 years and over and women at risk for fractures or osteoporosis should discuss screening with their health care providers. Ask your health care provider whether you should take a calcium supplement or vitamin D to reduce the rate of osteoporosis.  Menopause can be associated with physical symptoms and risks. Hormone replacement therapy is available to decrease symptoms and risks. You should talk to your health care provider about whether hormone replacement therapy is right for you.  Use sunscreen. Apply sunscreen liberally and repeatedly throughout the day. You should seek shade when your shadow is shorter than you. Protect yourself by wearing long sleeves, pants, a wide-brimmed hat, and sunglasses year round, whenever you are outdoors.  Once a month, do a whole body skin exam, using a mirror to look at the skin on your back. Tell your health care provider of new moles, moles that have irregular borders, moles that  are larger than a pencil eraser, or moles that have changed in shape or color.  Stay current with required vaccines (immunizations).  Influenza vaccine. All adults should be immunized every year.  Tetanus, diphtheria, and acellular pertussis (Td, Tdap) vaccine. Pregnant women should receive 1 dose of Tdap vaccine during each pregnancy. The dose should be obtained regardless of the length of time since the last dose. Immunization is preferred during the 27th-36th week of gestation. An adult who has not previously received Tdap or who does not know her vaccine status should receive 1 dose of Tdap. This initial dose should be followed by tetanus and diphtheria toxoids (Td) booster doses every 10 years. Adults with an unknown or incomplete history of completing a 3-dose immunization series with Td-containing vaccines should begin or complete a primary immunization series including a Tdap dose. Adults should receive a Td booster every 10 years.  Varicella vaccine. An adult without evidence of immunity to varicella should receive 2 doses or a second dose if she has previously received 1 dose. Pregnant females who do not have evidence of immunity should receive the first dose after pregnancy. This first dose should be obtained before leaving the health care facility. The second dose should be obtained 4-8 weeks after the first dose.  Human papillomavirus (HPV) vaccine. Females aged 13-26 years who have not received the vaccine previously should obtain the 3-dose series. The vaccine is not recommended for use in pregnant females. However, pregnancy testing is not needed before receiving a dose. If a female is found to be pregnant after receiving a dose, no treatment is needed. In that case, the remaining doses should be delayed until after the pregnancy. Immunization is recommended for any person with an immunocompromised condition through the age of 61 years if she did not get any or all doses earlier. During the  3-dose series, the second dose should be obtained 4-8 weeks after the first dose. The third dose should be obtained 24 weeks after the first dose and 16 weeks after the second dose.  Zoster vaccine. One dose is recommended for adults aged 30 years or older unless certain conditions are present.  Measles, mumps, and rubella (MMR) vaccine. Adults born  before 1957 generally are considered immune to measles and mumps. Adults born in 1957 or later should have 1 or more doses of MMR vaccine unless there is a contraindication to the vaccine or there is laboratory evidence of immunity to each of the three diseases. A routine second dose of MMR vaccine should be obtained at least 28 days after the first dose for students attending postsecondary schools, health care workers, or international travelers. People who received inactivated measles vaccine or an unknown type of measles vaccine during 1963-1967 should receive 2 doses of MMR vaccine. People who received inactivated mumps vaccine or an unknown type of mumps vaccine before 1979 and are at high risk for mumps infection should consider immunization with 2 doses of MMR vaccine. For females of childbearing age, rubella immunity should be determined. If there is no evidence of immunity, females who are not pregnant should be vaccinated. If there is no evidence of immunity, females who are pregnant should delay immunization until after pregnancy. Unvaccinated health care workers born before 1957 who lack laboratory evidence of measles, mumps, or rubella immunity or laboratory confirmation of disease should consider measles and mumps immunization with 2 doses of MMR vaccine or rubella immunization with 1 dose of MMR vaccine.  Pneumococcal 13-valent conjugate (PCV13) vaccine. When indicated, a person who is uncertain of his immunization history and has no record of immunization should receive the PCV13 vaccine. All adults 65 years of age and older should receive this  vaccine. An adult aged 19 years or older who has certain medical conditions and has not been previously immunized should receive 1 dose of PCV13 vaccine. This PCV13 should be followed with a dose of pneumococcal polysaccharide (PPSV23) vaccine. Adults who are at high risk for pneumococcal disease should obtain the PPSV23 vaccine at least 8 weeks after the dose of PCV13 vaccine. Adults older than 65 years of age who have normal immune system function should obtain the PPSV23 vaccine dose at least 1 year after the dose of PCV13 vaccine.  Pneumococcal polysaccharide (PPSV23) vaccine. When PCV13 is also indicated, PCV13 should be obtained first. All adults aged 65 years and older should be immunized. An adult younger than age 65 years who has certain medical conditions should be immunized. Any person who resides in a nursing home or long-term care facility should be immunized. An adult smoker should be immunized. People with an immunocompromised condition and certain other conditions should receive both PCV13 and PPSV23 vaccines. People with human immunodeficiency virus (HIV) infection should be immunized as soon as possible after diagnosis. Immunization during chemotherapy or radiation therapy should be avoided. Routine use of PPSV23 vaccine is not recommended for American Indians, [place] Natives, or people younger than 65 years unless there are medical conditions that require PPSV23 vaccine. When indicated, people who have unknown immunization and have no record of immunization should receive PPSV23 vaccine. One-time revaccination 5 years after the first dose of PPSV23 is recommended for people aged 19-64 years who have chronic kidney failure, nephrotic syndrome, asplenia, or immunocompromised conditions. People who received 1-2 doses of PPSV23 before age 65 years should receive another dose of PPSV23 vaccine at age 65 years or later if at least 5 years have passed since the previous dose. Doses of PPSV23 are not  needed for people immunized with PPSV23 at or after age 65 years.  Meningococcal vaccine. Adults with asplenia or persistent complement component deficiencies should receive 2 doses of quadrivalent meningococcal conjugate (MenACWY-D) vaccine. The doses should be obtained   at least 2 months apart. Microbiologists working with certain meningococcal bacteria, [place] recruits, people at risk during an outbreak, and people who travel to or live in countries with a high rate of meningitis should be immunized. A first-year college student up through age 34 years who is living in a residence hall should receive a dose if she did not receive a dose on or after her 16th birthday. Adults who have certain high-risk conditions should receive one or more doses of vaccine.  Hepatitis A vaccine. Adults who wish to be protected from this disease, have certain high-risk conditions, work with hepatitis A-infected animals, work in hepatitis A research labs, or travel to or work in countries with a high rate of hepatitis A should be immunized. Adults who were previously unvaccinated and who anticipate close contact with an international adoptee during the first 60 days after arrival in the [place] from a country with a high rate of hepatitis A should be immunized.  Hepatitis B vaccine. Adults who wish to be protected from this disease, have certain high-risk conditions, may be exposed to blood or other infectious body fluids, are household contacts or sex partners of hepatitis B positive people, are clients or workers in certain care facilities, or travel to or work in countries with a high rate of hepatitis B should be immunized.  Haemophilus influenzae type b (Hib) vaccine. A previously unvaccinated person with asplenia or sickle cell disease or having a scheduled splenectomy should receive 1 dose of Hib vaccine. Regardless of previous immunization, a recipient of a hematopoietic stem cell transplant should receive a  3-dose series 6-12 months after her successful transplant. Hib vaccine is not recommended for adults with HIV infection. Preventive Services / Frequency Ages 35 to 4 years  Blood pressure check.** / Every 3-5 years.  Lipid and cholesterol check.** / Every 5 years beginning at age 60.  Clinical breast exam.** / Every 3 years for women in their 71s and 10s.  BRCA-related cancer risk assessment.** / For women who have family members with a BRCA-related cancer (breast, ovarian, tubal, or peritoneal cancers).  Pap test.** / Every 2 years from ages 76 through 26. Every 3 years starting at age 61 through age 76 or 93 with a history of 3 consecutive normal Pap tests.  HPV screening.** / Every 3 years from ages 37 through ages 60 to 51 with a history of 3 consecutive normal Pap tests.  Hepatitis C blood test.** / For any individual with known risks for hepatitis C.  Skin self-exam. / Monthly.  Influenza vaccine. / Every year.  Tetanus, diphtheria, and acellular pertussis (Tdap, Td) vaccine.** / Consult your health care provider. Pregnant women should receive 1 dose of Tdap vaccine during each pregnancy. 1 dose of Td every 10 years.  Varicella vaccine.** / Consult your health care provider. Pregnant females who do not have evidence of immunity should receive the first dose after pregnancy.  HPV vaccine. / 3 doses over 6 months, if 93 and younger. The vaccine is not recommended for use in pregnant females. However, pregnancy testing is not needed before receiving a dose.  Measles, mumps, rubella (MMR) vaccine.** / You need at least 1 dose of MMR if you were born in 1957 or later. You may also need a 2nd dose. For females of childbearing age, rubella immunity should be determined. If there is no evidence of immunity, females who are not pregnant should be vaccinated. If there is no evidence of immunity, females who are  pregnant should delay immunization until after pregnancy.  Pneumococcal  13-valent conjugate (PCV13) vaccine.** / Consult your health care provider.  Pneumococcal polysaccharide (PPSV23) vaccine.** / 1 to 2 doses if you smoke cigarettes or if you have certain conditions.  Meningococcal vaccine.** / 1 dose if you are age 68 to 8 years and a Market researcher living in a residence hall, or have one of several medical conditions, you need to get vaccinated against meningococcal disease. You may also need additional booster doses.  Hepatitis A vaccine.** / Consult your health care provider.  Hepatitis B vaccine.** / Consult your health care provider.  Haemophilus influenzae type b (Hib) vaccine.** / Consult your health care provider. Ages 7 to 53 years  Blood pressure check.** / Every year.  Lipid and cholesterol check.** / Every 5 years beginning at age 25 years.  Lung cancer screening. / Every year if you are aged 11-80 years and have a 30-pack-year history of smoking and currently smoke or have quit within the past 15 years. Yearly screening is stopped once you have quit smoking for at least 15 years or develop a health problem that would prevent you from having lung cancer treatment.  Clinical breast exam.** / Every year after age 48 years.  BRCA-related cancer risk assessment.** / For women who have family members with a BRCA-related cancer (breast, ovarian, tubal, or peritoneal cancers).  Mammogram.** / Every year beginning at age 41 years and continuing for as long as you are in good health. Consult with your health care provider.  Pap test.** / Every 3 years starting at age 65 years through age 37 or 70 years with a history of 3 consecutive normal Pap tests.  HPV screening.** / Every 3 years from ages 72 years through ages 60 to 40 years with a history of 3 consecutive normal Pap tests.  Fecal occult blood test (FOBT) of stool. / Every year beginning at age 21 years and continuing until age 5 years. You may not need to do this test if you get  a colonoscopy every 10 years.  Flexible sigmoidoscopy or colonoscopy.** / Every 5 years for a flexible sigmoidoscopy or every 10 years for a colonoscopy beginning at age 35 years and continuing until age 48 years.  Hepatitis C blood test.** / For all people born from [birth year] through 1965 and any individual with known risks for hepatitis C.  Skin self-exam. / Monthly.  Influenza vaccine. / Every year.  Tetanus, diphtheria, and acellular pertussis (Tdap/Td) vaccine.** / Consult your health care provider. Pregnant women should receive 1 dose of Tdap vaccine during each pregnancy. 1 dose of Td every 10 years.  Varicella vaccine.** / Consult your health care provider. Pregnant females who do not have evidence of immunity should receive the first dose after pregnancy.  Zoster vaccine.** / 1 dose for adults aged 30 years or older.  Measles, mumps, rubella (MMR) vaccine.** / You need at least 1 dose of MMR if you were born in 1957 or later. You may also need a second dose. For females of childbearing age, rubella immunity should be determined. If there is no evidence of immunity, females who are not pregnant should be vaccinated. If there is no evidence of immunity, females who are pregnant should delay immunization until after pregnancy.  Pneumococcal 13-valent conjugate (PCV13) vaccine.** / Consult your health care provider.  Pneumococcal polysaccharide (PPSV23) vaccine.** / 1 to 2 doses if you smoke cigarettes or if you have certain conditions.  Meningococcal vaccine.** /  Consult your health care provider.  Hepatitis A vaccine.** / Consult your health care provider.  Hepatitis B vaccine.** / Consult your health care provider.  Haemophilus influenzae type b (Hib) vaccine.** / Consult your health care provider. Ages 64 years and over  Blood pressure check.** / Every year.  Lipid and cholesterol check.** / Every 5 years beginning at age 23 years.  Lung cancer screening. / Every year if you  are aged 16-80 years and have a 30-pack-year history of smoking and currently smoke or have quit within the past 15 years. Yearly screening is stopped once you have quit smoking for at least 15 years or develop a health problem that would prevent you from having lung cancer treatment.  Clinical breast exam.** / Every year after age 74 years.  BRCA-related cancer risk assessment.** / For women who have family members with a BRCA-related cancer (breast, ovarian, tubal, or peritoneal cancers).  Mammogram.** / Every year beginning at age 44 years and continuing for as long as you are in good health. Consult with your health care provider.  Pap test.** / Every 3 years starting at age 58 years through age 22 or 39 years with 3 consecutive normal Pap tests. Testing can be stopped between 65 and 70 years with 3 consecutive normal Pap tests and no abnormal Pap or HPV tests in the past 10 years.  HPV screening.** / Every 3 years from ages 64 years through ages 70 or 61 years with a history of 3 consecutive normal Pap tests. Testing can be stopped between 65 and 70 years with 3 consecutive normal Pap tests and no abnormal Pap or HPV tests in the past 10 years.  Fecal occult blood test (FOBT) of stool. / Every year beginning at age 40 years and continuing until age 27 years. You may not need to do this test if you get a colonoscopy every 10 years.  Flexible sigmoidoscopy or colonoscopy.** / Every 5 years for a flexible sigmoidoscopy or every 10 years for a colonoscopy beginning at age 7 years and continuing until age 32 years.  Hepatitis C blood test.** / For all people born from [birth year] through 1965 and any individual with known risks for hepatitis C.  Osteoporosis screening.** / A one-time screening for women ages 30 years and over and women at risk for fractures or osteoporosis.  Skin self-exam. / Monthly.  Influenza vaccine. / Every year.  Tetanus, diphtheria, and acellular pertussis (Tdap/Td)  vaccine.** / 1 dose of Td every 10 years.  Varicella vaccine.** / Consult your health care provider.  Zoster vaccine.** / 1 dose for adults aged 35 years or older.  Pneumococcal 13-valent conjugate (PCV13) vaccine.** / Consult your health care provider.  Pneumococcal polysaccharide (PPSV23) vaccine.** / 1 dose for all adults aged 46 years and older.  Meningococcal vaccine.** / Consult your health care provider.  Hepatitis A vaccine.** / Consult your health care provider.  Hepatitis B vaccine.** / Consult your health care provider.  Haemophilus influenzae type b (Hib) vaccine.** / Consult your health care provider. ** Family history and personal history of risk and conditions may change your health care provider's recommendations.   This information is not intended to replace advice given to you by your health care provider. Make sure you discuss any questions you have with your health care provider.   Document Released: 04/04/2001 Document Revised: 02/27/2014 Document Reviewed: 07/04/2010 Elsevier Interactive Patient Education [employer].

## 2015-09-28 NOTE — Assessment & Plan Note (Signed)
Levimir and Metformin

## 2015-09-28 NOTE — Assessment & Plan Note (Addendum)
Here for medicare wellness/physical  Diet: heart healthy  Physical activity: not sedentary  Depression/mood screen: negative  Hearing: intact to whispered voice  Visual acuity: grossly normal, performs annual eye exam  ADLs: capable  Fall risk: low to none  Home safety: good  Cognitive evaluation: intact to orientation, naming, recall and repetition  EOL planning: adv directives, full code/ I agree  I have personally reviewed and have noted  1. The patient's medical, surgical and social history  2. Their use of alcohol, tobacco or illicit drugs  3. Their current medications and supplements  4. The patient's functional ability including ADL's, fall risks, home safety risks and hearing or visual impairment.  5. Diet and physical activities  6. Evidence for depression or mood disorders 7. The roster of all physicians providing medical care to patient - is listed in the Snapshot section of the chart and reviewed today.    Today patient counseled on age appropriate routine health concerns for screening and prevention, each reviewed and up to date or declined. Immunizations reviewed and up to date or declined. Labs ordered and reviewed. Risk factors for depression reviewed and negative. Hearing function and visual acuity are intact. ADLs screened and addressed as needed. Functional ability and level of safety reviewed and appropriate. Education, counseling and referrals performed based on assessed risks today. Patient provided with a copy of personalized plan for preventive services.   Ophth 2016, PAP q 12 mo Colonoscopy

## 2015-09-28 NOTE — Progress Notes (Signed)
Pre visit review using our clinic review tool, if applicable. No additional management support is needed unless otherwise documented below in the visit note. 

## 2015-09-28 NOTE — Assessment & Plan Note (Signed)
Labs

## 2015-09-28 NOTE — Progress Notes (Signed)
Subjective:  Patient ID: Catherine Mora, female    DOB: 18-Feb-1951  Age: 65 y.o. MRN: [id number]  CC: No chief complaint on file.   HPI [name] presents for a well exam.   Outpatient Medications Prior to Visit  Medication Sig Dispense Refill  . ALPRAZolam (XANAX) 1 MG tablet Take 0.5-1 tablets (0.5-1 mg total) by mouth 2 (two) times daily as needed for sleep or anxiety. 60 tablet 3  . bisacodyl (DULCOLAX) 5 MG EC tablet Take 1 tablet (5 mg total) by mouth daily as needed for moderate constipation. 30 tablet 0  . fluconazole (DIFLUCAN) 200 MG tablet Take 200 mg by mouth every 30 (thirty) days.     Marland Kitchen ibuprofen (ADVIL,MOTRIN) 200 MG tablet Take 400 mg by mouth every 6 (six) hours as needed for headache.    . Insulin Detemir (LEVEMIR FLEXPEN) 100 UNIT/ML Pen Inject 16 Units into the skin daily with breakfast. 15 mL 2  . Insulin Pen Needle 31G X 5 MM MISC Use with Levemir pen 100 each 0  . ONETOUCH VERIO test strip 1 each by Other route 2 (two) times daily.     Marland Kitchen escitalopram (LEXAPRO) 10 MG tablet Take 1 tablet (10 mg total) by mouth daily. (Patient not taking: Reported on 09/28/2015) 90 tablet 2  . ESTRACE VAGINAL 0.1 MG/GM vaginal cream Place 1 Applicatorful vaginally once a week.     . metFORMIN (GLUCOPHAGE-XR) 500 MG 24 hr tablet Take 1 tablet by mouth 2 (two) times daily with a meal.    . pantoprazole (PROTONIX) 20 MG tablet Take 1 tablet (20 mg total) by mouth daily. (Patient not taking: Reported on 09/28/2015) 90 tablet 3   No facility-administered medications prior to visit.     ROS Review of Systems  Constitutional: Negative for activity change, appetite change, chills, fatigue and unexpected weight change.  HENT: Negative for congestion, mouth sores and sinus pressure.   Eyes: Negative for visual disturbance.  Respiratory: Negative for cough and chest tightness.   Gastrointestinal: Positive for constipation. Negative for abdominal pain and nausea.  Genitourinary:  Negative for difficulty urinating, frequency and vaginal pain.  Musculoskeletal: Negative for back pain and gait problem.  Skin: Negative for pallor and rash.  Neurological: Negative for dizziness, tremors, weakness, numbness and headaches.  Psychiatric/Behavioral: Negative for confusion and sleep disturbance. The patient is nervous/anxious.     Objective:  BP 122/84   Pulse 70   Ht 5\' 10"  (1.778 m)   Wt 193 lb (87.5 kg)   SpO2 94%   BMI 27.69 kg/m   BP Readings from Last 3 Encounters:  09/28/15 122/84  03/31/15 130/90  09/28/14 120/88    Wt Readings from Last 3 Encounters:  09/28/15 193 lb (87.5 kg)  03/31/15 191 lb (86.6 kg)  09/28/14 168 lb (76.2 kg)    Physical Exam  Constitutional: She appears well-developed. No distress.  HENT:  Head: Normocephalic.  Right Ear: External ear normal.  Left Ear: External ear normal.  Nose: Nose normal.  Mouth/Throat: Oropharynx is clear and moist.  Eyes: Conjunctivae are normal. Pupils are equal, round, and reactive to light. Right eye exhibits no discharge. Left eye exhibits no discharge.  Neck: Normal range of motion. Neck supple. No JVD present. No tracheal deviation present. No thyromegaly present.  Cardiovascular: Normal rate, regular rhythm and normal heart sounds.   Pulmonary/Chest: No stridor. No respiratory distress. She has no wheezes.  Abdominal: Soft. Bowel sounds are normal. She exhibits no distension  and no mass. There is no tenderness. There is no rebound and no guarding.  Musculoskeletal: She exhibits no edema or tenderness.  Lymphadenopathy:    She has no cervical adenopathy.  Neurological: She displays normal reflexes. No cranial nerve deficit. She exhibits normal muscle tone. Coordination normal.  Skin: No rash noted. No erythema.  Psychiatric: She has a normal mood and affect. Her behavior is normal. Judgment and thought content normal.    Lab Results  Component Value Date   WBC 6.7 08/21/2014   HGB 12.2  08/21/2014   HCT 35.5 (L) 08/21/2014   PLT 171 08/21/2014   GLUCOSE 136 (H) 08/27/2014   CHOL 382 (H) 08/17/2014   TRIG 324 (H) 08/17/2014   HDL 48 08/17/2014   LDLDIRECT 219.0 08/14/2014   LDLCALC 269 (H) 08/17/2014   ALT 20 08/17/2014   AST 24 08/17/2014   NA 137 08/27/2014   K 4.8 08/27/2014   CL 101 08/27/2014   CREATININE 0.67 08/27/2014   BUN 11 08/27/2014   CO2 28 08/27/2014   TSH 1.51 08/14/2014   HGBA1C 14.3 (H) 08/17/2014    Ct Abdomen Pelvis W Contrast  Result Date: 08/17/2014 CLINICAL DATA:  65 year old female with nausea and vomiting since yesterday. Patient has medical history of IBS, hypertension, with recent diagnosis of diabetes. EXAM: CT ABDOMEN AND PELVIS WITH CONTRAST TECHNIQUE: Multidetector CT imaging of the abdomen and pelvis was performed using the standard protocol following bolus administration of intravenous contrast. CONTRAST:  37mL OMNIPAQUE IOHEXOL 300 MG/ML SOLN, 129mL OMNIPAQUE IOHEXOL 300 MG/ML SOLN COMPARISON:  Abdominal ultrasound dated 07/02/2012 FINDINGS: Lower chest:  Unremarkable. Peritoneum: No free air or free fluid. Liver: Unremarkable.No intrahepatic biliary ductal dilatation. Gallbladder: Cholecystectomy. Pancreas: Unremarkable.No ductal dilatation. Spleen: Unremarkable. Adrenals glands: Unremarkable. Kidneys, ureters, urinary bladder: There is minimal fullness of the right renal collecting system. The left kidney is unremarkable. Reproductive: Hysterectomy. Bowel and appendix: Constipation. No bowel obstruction. There is mild distention of the stomach without definite evidence of gastric outlet obstruction. A small pocket of air along the pylorus appears to be intraluminal. There is minimal apparent haziness of the fat surrounding the pylorus and proximal duodenum which may be related to volume averaging artifact. Focal duodenitis is less likely but not excluded. Clinical correlation is recommended. The appendix is not visualized with certainty. No  inflammatory changes identified in the right lower quadrant. Vascular/Lymphatic: Mild atherosclerotic calcification of the aorta.No lymphadenopathy. Abdominal wall/Musculoskeletal: Small fat containing umbilical hernia. A small supraumbilical peritoneal defect is noted containing small omental fat. Mild degenerative changes of the spine. IMPRESSION: Mildly distended stomach without definite evidence of gastric outlet or small bowel obstruction. Minimal apparent haziness of the fat surrounding the pylorus which may be artifactual or represent mild focal inflammatory changes. Clinical correlation is recommended. Electronically Signed   By: Anner Crete M.D.   On: 08/17/2014 23:27    Assessment & Plan:   Diagnoses and all orders for this visit:  Type 2 diabetes mellitus without complication, with long-term current use of insulin (Central)  Well adult exam  Other orders -     ALPRAZolam (XANAX) 1 MG tablet; Take 0.5-1 tablets (0.5-1 mg total) by mouth 2 (two) times daily as needed for sleep or anxiety. -     bisacodyl (DULCOLAX) 5 MG EC tablet; Take 1 tablet (5 mg total) by mouth daily as needed for moderate constipation.   I am having Ms. Lantigua maintain her fluconazole, ESTRACE VAGINAL, ibuprofen, bisacodyl, Insulin Pen Needle, ONETOUCH VERIO, Insulin Detemir,  pantoprazole, metFORMIN, ALPRAZolam, and escitalopram.  No orders of the defined types were placed in this encounter.    Follow-up: No Follow-up on file.  Walker Kehr, MD

## 2015-09-29 LAB — HEPATITIS C ANTIBODY: HCV Ab: NEGATIVE

## 2015-09-30 ENCOUNTER — Encounter: Payer: Self-pay | Admitting: Gastroenterology

## 2015-10-05 DIAGNOSIS — Z794 Long term (current) use of insulin: Secondary | ICD-10-CM | POA: Insufficient documentation

## 2015-10-05 DIAGNOSIS — E119 Type 2 diabetes mellitus without complications: Secondary | ICD-10-CM | POA: Diagnosis not present

## 2015-10-05 DIAGNOSIS — E663 Overweight: Secondary | ICD-10-CM | POA: Diagnosis not present

## 2015-11-08 DIAGNOSIS — E118 Type 2 diabetes mellitus with unspecified complications: Secondary | ICD-10-CM | POA: Diagnosis not present

## 2015-11-16 ENCOUNTER — Ambulatory Visit (AMBULATORY_SURGERY_CENTER): Payer: Self-pay | Admitting: *Deleted

## 2015-11-16 VITALS — Ht 70.0 in | Wt 192.0 lb

## 2015-11-16 DIAGNOSIS — Z1211 Encounter for screening for malignant neoplasm of colon: Secondary | ICD-10-CM

## 2015-11-16 MED ORDER — NA SULFATE-K SULFATE-MG SULF 17.5-3.13-1.6 GM/177ML PO SOLN: 1.0000 | Freq: Once | ORAL | 0 refills | Status: AC

## 2015-11-16 NOTE — Progress Notes (Signed)
No egg or soy allergy known to patient  No issues with past sedation with any surgeries  or procedures, no intubation problems  No diet pills per patient No home 02 use per patient  No blood thinners per patient  Pt denies issues with constipation  No A fib or A flutter   

## 2015-11-18 DIAGNOSIS — A6004 Herpesviral vulvovaginitis: Secondary | ICD-10-CM | POA: Diagnosis not present

## 2015-11-22 ENCOUNTER — Telehealth: Payer: Self-pay | Admitting: Gastroenterology

## 2015-11-22 NOTE — Telephone Encounter (Signed)
Agree with advice as outlined. If no improved by Friday she needs to contact us. This should be cleared prior to her procedure. thanks

## 2015-11-22 NOTE — Telephone Encounter (Signed)
Pt experiencing outbreak of genital herpes.  She feels it will be cleared up by next Tuesday (procedure day).  Advised to call us again if no improvement by this Friday. May need to be completely clear before procedure.  Will forfward this note to MD for further instruction.                                                          Angela/PV

## 2015-11-30 ENCOUNTER — Encounter: Payer: Medicare Other | Admitting: Gastroenterology

## 2015-11-30 DIAGNOSIS — N762 Acute vulvitis: Secondary | ICD-10-CM | POA: Diagnosis not present

## 2015-12-06 DIAGNOSIS — N762 Acute vulvitis: Secondary | ICD-10-CM | POA: Diagnosis not present

## 2015-12-22 ENCOUNTER — Other Ambulatory Visit: Payer: Self-pay

## 2016-01-06 ENCOUNTER — Ambulatory Visit (INDEPENDENT_AMBULATORY_CARE_PROVIDER_SITE_OTHER): Payer: Medicare Other | Admitting: Physician Assistant

## 2016-01-06 VITALS — BP 136/77 | HR 67 | Temp 98.4°F | Resp 15

## 2016-01-06 DIAGNOSIS — Z7184 Encounter for health counseling related to travel: Secondary | ICD-10-CM

## 2016-01-06 DIAGNOSIS — L719 Rosacea, unspecified: Secondary | ICD-10-CM

## 2016-01-06 DIAGNOSIS — Z7189 Other specified counseling: Secondary | ICD-10-CM

## 2016-01-06 MED ORDER — METRONIDAZOLE 0.75 % EX CREA
TOPICAL_CREAM | Freq: Two times a day (BID) | CUTANEOUS | 0 refills | Status: DC
Start: 2016-01-06 — End: 2016-04-04

## 2016-01-06 MED ORDER — AZITHROMYCIN 250 MG OR TABS
ORAL_TABLET | ORAL | 0 refills | Status: DC
Start: 2016-02-05 — End: 2016-03-01

## 2016-01-06 NOTE — Progress Notes (Signed)
Chief Complaint   Patient presents with   . Facial Pain     pt c/o red face and has been hot to the touch. she wonders if she had a possible reaction to a cortisone shot she had on tuesday       SUBJECTIVE:  Paula Bowman is an 65 year old female who presents with redness and "hot" of the face for the past 2 days. Received cortisone injection of the right knee 2 days ago and noticed feeling hot in the face later on in the day. Woke with worsening redness and feeling hot in the face, "so hot, I felt chilled." notes warmth is improved today but redness of the face is still present. Read online she could possibly be having a rosacea flare from the cortisone. She was Dx'd with rosacea many years ago but has not had issues since. Denies nausea or vomiting. Having a lot of anxiety over it. No shortness of breath. Rash of the face is not painful, nor itchy. Slightly tingly. Applied aloe to the face without improvement.    Patient will be traveling on a cruise at the end of the year. Requesting [employer] for travel.      Outpatient Medications Prior to Visit   Medication Sig Dispense Refill   . ALPRAZolam (XANAX OR)      . Colestipol HCl 1 G Oral Tab Take 1 tablet by mouth daily.     . CycloSPORINE 0.05 % Ophthalmic Emulsion Place 1 drop in each EYE every 12 hours.     . Esomeprazole Magnesium (NEXIUM) 40 MG Oral CAPSULE DELAYED RELEASE One every other day for acid reflux     . Fluticasone Propionate 50 MCG/ACT Nasal Suspension Spray 2 sprays into each nostril daily. For prevention/control of nasal/sinus congestion 3 Inhaler 3   . Ipratropium Bromide 0.06 % Nasal Solution Spray 2 sprays into each nostril daily. 1 bottle 2   . Olopatadine HCl 0.1 % Ophthalmic Solution Place 1 drop in each EYE 2 times a day. as needed (the azelastine is not tolerated)     . TraMADol HCl 50 MG Oral Tab Take 1 tablet (50 mg) by mouth every 6 hours as needed for pain. (Patient not taking: Reported on 01/06/2016) 10 tablet 0     No  facility-administered medications prior to visit.        Review of patient's allergies indicates:  Allergies   Allergen Reactions   . Amoxicillin Rash   . Levofloxacin Itching     Redness up arm from IV dose   . Penicillin G Rash   . Terbinafine Hcl      Loss of sense of taste       Social History   Substance Use Topics   . Smoking status: Former Smoker     Packs/day: 2.00     Years: 15.00     Types: Cigarettes     Quit date: 01/07/1988   . Smokeless tobacco: Never Used   . Alcohol use 0.5 oz/week     1 Standard drinks or equivalent per week      Comment: 2 glasses of wine daily       Review of Systems   Constitutional: Negative for chills, diaphoresis, fatigue and fever.   HENT: Negative for congestion.    Respiratory: Negative for cough, shortness of breath and wheezing.    Gastrointestinal: Negative for nausea and vomiting.   Skin: Positive for rash (face).       OBJECTIVE:  BP 136/77   Pulse 67   Temp 98.4 F (36.9 C) (Temporal)   Resp 15   SpO2 99%   Physical Exam   Constitutional: She appears well-developed and well-nourished. No distress.   HENT:   Head: Normocephalic.       Mouth/Throat: Uvula is midline, oropharynx is clear and moist and mucous membranes are normal. No uvula swelling. No posterior oropharyngeal edema or posterior oropharyngeal erythema.   Cardiovascular: Normal rate, regular rhythm and normal heart sounds.    No murmur heard.  Pulmonary/Chest: Effort normal and breath sounds normal. No respiratory distress. She has no decreased breath sounds. She has no wheezes. She has no rhonchi.   Lymphadenopathy:        Head (right side): No submandibular, no tonsillar and no preauricular adenopathy present.        Head (left side): No submandibular, no tonsillar and no preauricular adenopathy present.     She has no cervical adenopathy.   Neurological: She is alert.   Skin: Skin is warm and dry. She is not diaphoretic.   Psychiatric: She has a normal mood and affect.   Nursing note and vitals  reviewed.      ASSESSMENT/PLAN  (L71.9) Rosacea  (primary encounter diagnosis)  Plan: MetroNIDAZOLE 0.75 % External Cream    (Z71.89) Counseling for travel  Plan: Azithromycin 250 MG Oral Tab    Will treat Rosacea with metronidazole for 7-10 days (up to 14 days if necessary)  Advised Dermatology follow up for symptoms if not improving over the next 7-10 days  Okay to continue using Aloe as needed for cooling and comfort of the skin  Follow up with PCP as needed.    Discussed medication in detail including dosing, side effects, and interactions.    Irene Pap, PA-C  [facility]

## 2016-01-06 NOTE — Progress Notes (Signed)
Pt roomed, medications, and allergies verified and updated by August Luz, MA-C  August Luz, 01/06/2016 10:13 AM

## 2016-01-06 NOTE — Patient Instructions (Addendum)
It was a pleasure to see you in clinic today. Your Medical Assistant was: Karsten Fells can schedule an appointment to see [place] by calling [phone] or via eCare.     If labs were ordered today the results are expected to be available via eCare 5 days later. Otherwise, result letters are mailed 7-10 days after your tests are completed. If your physician needs to change your care based on your results, you will receive a phone call to notify you. If you haven't heard from him/her and it has been more than 10 days please give [place] a call.     Thank you for choosing [facility].         Patient Education     Rosacea  Rosacea is a long-lasting (chronic) skin condition affecting the face. In the early stages it causes easy flushing or blushing. Redness may become long-term (permanent) as the small blood vessels of the face widen (dilate). There may be small, red, pus-filled bumps (pustules). It looks like a bad case of acne, and has been called adult acne. But it is not caused by the same things that cause acne.  Rosacea is a chronic illness. You will have flare-ups that come and go. This may happen every few weeks or every few months. Experts don't know what causes rosacea. If not treated, it tends to get worse over time.  Some men with a more severe form of rosacea develop a condition called rhinophyma. The oil glands on the skin of the nose become blocked and the nose gets bigger. The cheeks also become puffy. Alcohol may increase the flushing. But this condition is not caused by alcohol use.  Rosacea can be treated with topical gels and creams. Oral antibiotics are used for more severe cases. You should see improvement in the first4 weeks. Dilated blood vessels can be treated with a small electric needle or laser surgery. Rhinophyma can be treated with surgery. Or it may be treated by surgically scraping the skin (dermabrasion).  Home care   Avoid things that make your face red or  flushed. These include hot drinks, spicy foods, caffeine, and alcohol.   Exercise indoors or in a cool area to avoid getting overheated.   Avoid excess sun exposure. Use a sunscreen with at least SPF 15 or higher.   Avoid extreme hot or extreme cold weather.   Don't scrub your face. That will irritate the skin and increase redness.   Avoid harsh soaps or moisturizers with irritating ingredients. You may need to use hypoallergenic cosmetics.   Avoid over-the-counter treatments unless instructed by your healthcare provider. Some of these treatments may make rosacea worse.   Try to figure out what triggers your flares (such as stress, sun exposure, or certain foods).  Follow-up care  Follow up with your healthcare provider, or as advised. Contact your provider if your condition is not responding to the medicines you were given. Getting treatment early can stop it from getting worse.  When to seek medical advice  Call your healthcare provider right away if you have redness, burning, or a gritty feeling in your eyes.  Date Last Reviewed: 09/21/2014   2000-2017 [employer]. [address], [place] [address]. All rights reserved. This information is not intended as a substitute for professional medical care. Always follow your healthcare professional's instructions.

## 2016-01-19 ENCOUNTER — Encounter: Payer: Self-pay | Admitting: Gastroenterology

## 2016-01-19 ENCOUNTER — Ambulatory Visit (AMBULATORY_SURGERY_CENTER): Payer: Medicare Other | Admitting: Gastroenterology

## 2016-01-19 VITALS — BP 123/88 | HR 73 | Temp 98.2°F | Resp 13 | Ht 70.0 in | Wt 193.0 lb

## 2016-01-19 DIAGNOSIS — D126 Benign neoplasm of colon, unspecified: Secondary | ICD-10-CM

## 2016-01-19 DIAGNOSIS — D120 Benign neoplasm of cecum: Secondary | ICD-10-CM | POA: Diagnosis not present

## 2016-01-19 DIAGNOSIS — Z1211 Encounter for screening for malignant neoplasm of colon: Secondary | ICD-10-CM | POA: Diagnosis present

## 2016-01-19 DIAGNOSIS — K635 Polyp of colon: Secondary | ICD-10-CM

## 2016-01-19 DIAGNOSIS — E119 Type 2 diabetes mellitus without complications: Secondary | ICD-10-CM | POA: Diagnosis not present

## 2016-01-19 DIAGNOSIS — D122 Benign neoplasm of ascending colon: Secondary | ICD-10-CM | POA: Diagnosis not present

## 2016-01-19 DIAGNOSIS — I10 Essential (primary) hypertension: Secondary | ICD-10-CM | POA: Diagnosis not present

## 2016-01-19 DIAGNOSIS — R112 Nausea with vomiting, unspecified: Secondary | ICD-10-CM | POA: Diagnosis not present

## 2016-01-19 DIAGNOSIS — K219 Gastro-esophageal reflux disease without esophagitis: Secondary | ICD-10-CM | POA: Diagnosis not present

## 2016-01-19 DIAGNOSIS — Z1212 Encounter for screening for malignant neoplasm of rectum: Secondary | ICD-10-CM

## 2016-01-19 DIAGNOSIS — K589 Irritable bowel syndrome without diarrhea: Secondary | ICD-10-CM | POA: Diagnosis not present

## 2016-01-19 LAB — GLUCOSE, CAPILLARY
GLUCOSE-CAPILLARY: 132 mg/dL — AB (ref 65–99)
GLUCOSE-CAPILLARY: 150 mg/dL — AB (ref 65–99)

## 2016-01-19 MED ORDER — SODIUM CHLORIDE 0.9 % IV SOLN: 500.0000 mL | INTRAVENOUS | Status: DC

## 2016-01-19 NOTE — Progress Notes (Signed)
Called to room to assist during endoscopic procedure.  Patient ID and intended procedure confirmed with present staff. Received instructions for my participation in the procedure from the performing physician.  

## 2016-01-19 NOTE — Patient Instructions (Signed)
Impression/recommendations:  Polyps (handout given) Hemorrhoids (handout given)  No ibuprofen, naproxen or other non-steroidal anti-inflammatory drugs for 2 weeks. May resume 02/04/16 if needed. Until then Tylenol only.  YOU HAD AN ENDOSCOPIC PROCEDURE TODAY AT [facility]:   Refer to the procedure report that was given to you for any specific questions about what was found during the examination.  If the procedure report does not answer your questions, please call your gastroenterologist to clarify.  If you requested that your care partner not be given the details of your procedure findings, then the procedure report has been included in a sealed envelope for you to review at your convenience later.  YOU SHOULD EXPECT: Some feelings of bloating in the abdomen. Passage of more gas than usual.  Walking can help get rid of the air that was put into your GI tract during the procedure and reduce the bloating. If you had a lower endoscopy (such as a colonoscopy or flexible sigmoidoscopy) you may notice spotting of blood in your stool or on the toilet paper. If you underwent a bowel prep for your procedure, you may not have a normal bowel movement for a few days.  Please Note:  You might notice some irritation and congestion in your nose or some drainage.  This is from the oxygen used during your procedure.  There is no need for concern and it should clear up in a day or so.  SYMPTOMS TO REPORT IMMEDIATELY:   Following lower endoscopy (colonoscopy or flexible sigmoidoscopy):  Excessive amounts of blood in the stool  Significant tenderness or worsening of abdominal pains  Swelling of the abdomen that is new, acute  Fever of 100F or higher  For urgent or emergent issues, a gastroenterologist can be reached at any hour by calling [phone].   DIET:  We do recommend a small meal at first, but then you may proceed to your regular diet.  Drink plenty of fluids but you should avoid  alcoholic beverages for 24 hours.  ACTIVITY:  You should plan to take it easy for the rest of today and you should NOT DRIVE or use heavy machinery until tomorrow (because of the sedation medicines used during the test).    FOLLOW UP: Our staff will call the number listed on your records the next business day following your procedure to check on you and address any questions or concerns that you may have regarding the information given to you following your procedure. If we do not reach you, we will leave a message.  However, if you are feeling well and you are not experiencing any problems, there is no need to return our call.  We will assume that you have returned to your regular daily activities without incident.  If any biopsies were taken you will be contacted by phone or by letter within the next 1-3 weeks.  Please call us at [phone] if you have not heard about the biopsies in 3 weeks.    SIGNATURES/CONFIDENTIALITY: You and/or your care partner have signed paperwork which will be entered into your electronic medical record.  These signatures attest to the fact that that the information above on your After Visit Summary has been reviewed and is understood.  Full responsibility of the confidentiality of this discharge information lies with you and/or your care-partner.

## 2016-01-19 NOTE — Op Note (Signed)
[facility] Patient Name: Catherine Mora Procedure Date: 01/19/2016 10:58 AM MRN: [id number] Endoscopist: Remo Lipps P. Armbruster MD, MD Age: 65 Referring MD:  Date of Birth: [birth year] Gender: Female Account #: [id number] Procedure:                Colonoscopy Indications:              Screening for malignant neoplasm in the colon Medicines:                Monitored Anesthesia Care Procedure:                Pre-Anesthesia Assessment:                           - Prior to the procedure, a History and Physical                            was performed, and patient medications and                            allergies were reviewed. The patient's tolerance of                            previous anesthesia was also reviewed. The risks                            and benefits of the procedure and the sedation                            options and risks were discussed with the patient.                            All questions were answered, and informed consent                            was obtained. Prior Anticoagulants: The patient has                            taken no previous anticoagulant or antiplatelet                            agents. ASA Grade Assessment: II - A patient with                            mild systemic disease. After reviewing the risks                            and benefits, the patient was deemed in                            satisfactory condition to undergo the procedure.                           After obtaining informed consent, the colonoscope  was passed under direct vision. Throughout the                            procedure, the patient's blood pressure, pulse, and                            oxygen saturations were monitored continuously. The                            [id number]) was introduced through the anus                            and advanced to the the cecum, identified by   appendiceal orifice and ileocecal valve. The                            colonoscopy was performed without difficulty. The                            patient tolerated the procedure well. The quality                            of the bowel preparation was good. The ileocecal                            valve, appendiceal orifice, and rectum were                            photographed. Scope In: 11:01:11 AM Scope Out: 11:15:51 AM Scope Withdrawal Time: 0 hours 12 minutes 9 seconds  Total Procedure Duration: 0 hours 14 minutes 40 seconds  Findings:                 The perianal and digital rectal examinations were                            normal.                           A 5 mm polyp was found in the cecum. The polyp was                            sessile. The polyp was removed with a cold snare.                            Resection and retrieval were complete.                           Three sessile polyps were found in the ascending                            colon. The polyps were 3 to 4 mm in size. These                            polyps  were removed with a cold snare. Resection                            was complete, but only 2 of the 3 polyps were                            retrieved.                           Internal hemorrhoids were found during retroflexion.                           The exam was otherwise without abnormality. Complications:            No immediate complications. Estimated blood loss:                            Minimal. Estimated Blood Loss:     Estimated blood loss was minimal. Impression:               - One 5 mm polyp in the cecum, removed with a cold                            snare. Resected and retrieved.                           - Three 3 to 4 mm polyps in the ascending colon,                            removed with a cold snare. Complete resection.                            Partial retrieval.                           - Internal hemorrhoids.                            - The examination was otherwise normal. Recommendation:           - Patient has a contact number available for                            emergencies. The signs and symptoms of potential                            delayed complications were discussed with the                            patient. Return to normal activities tomorrow.                            Written discharge instructions were provided to the                            patient.                           -  Resume previous diet.                           - Continue present medications.                           - No ibuprofen, naproxen, or other non-steroidal                            anti-inflammatory drugs for 2 weeks after polyp                            removal.                           - Await pathology results.                           - Repeat colonoscopy is recommended for                            surveillance. The colonoscopy date will be                            determined after pathology results from today's                            exam become available for review. Remo Lipps P. Armbruster MD, MD 01/19/2016 11:19:56 AM This report has been signed electronically.

## 2016-01-20 ENCOUNTER — Telehealth: Payer: Self-pay

## 2016-01-20 NOTE — Telephone Encounter (Signed)
  Follow up Call-  Call back number 01/19/2016  Post procedure Call Back phone  # [phone]  Permission to leave phone message Yes  Some recent data might be hidden     Patient questions:  Do you have a fever, pain , or abdominal swelling? No. Pain Score  0 *  Have you tolerated food without any problems? Yes.    Have you been able to return to your normal activities? Yes.    Do you have any questions about your discharge instructions: Diet   No. Medications  No. Follow up visit  No.  Do you have questions or concerns about your Care? No.  Actions: * If pain score is 4 or above: No action needed, pain <4.

## 2016-01-25 ENCOUNTER — Encounter: Payer: Self-pay | Admitting: Gastroenterology

## 2016-02-01 ENCOUNTER — Ambulatory Visit (INDEPENDENT_AMBULATORY_CARE_PROVIDER_SITE_OTHER): Payer: Medicare Other | Admitting: Family Medicine

## 2016-02-01 VITALS — BP 128/94 | HR 66 | Temp 97.9°F | Resp 16 | Ht 69.5 in | Wt 195.0 lb

## 2016-02-01 DIAGNOSIS — L2481 Irritant contact dermatitis due to metals: Secondary | ICD-10-CM

## 2016-02-01 DIAGNOSIS — Z794 Long term (current) use of insulin: Secondary | ICD-10-CM | POA: Diagnosis not present

## 2016-02-01 DIAGNOSIS — E119 Type 2 diabetes mellitus without complications: Secondary | ICD-10-CM | POA: Diagnosis not present

## 2016-02-01 DIAGNOSIS — E663 Overweight: Secondary | ICD-10-CM | POA: Diagnosis not present

## 2016-02-01 MED ORDER — HYDROCORTISONE 2.5 % EX CREA: TOPICAL_CREAM | Freq: Two times a day (BID) | CUTANEOUS | 0 refills | Status: DC

## 2016-02-01 NOTE — Progress Notes (Signed)
Chief Complaint  Patient presents with  . Rash    started in the summer, gotten darker/bigger in last week or so     HPI   New Patient New Concern Pt reports that she started having red spots over the summer that she noticed after showering around the belly button  She reports that recently the rash seems to be getting darker She reports that recently it started itching  She denies wearing a belt or pants with a buckle She denies any periumbilical pain She states that it has not occur anywhere else.  She denies fevers, chills or progression of the rash  Overweight She reports that she has gained quite a bit of weight and her abdomen is pushing on  Her pants waist a lot more Wt Readings from Last 3 Encounters:  02/01/16 195 lb (88.5 kg)  01/19/16 193 lb (87.5 kg)  11/16/15 192 lb (87.1 kg)   She reports that she is not exercise.  She reports that she is a bit of a couch potato  Diabetes mellitus insulin dependent She reports that she gives herself insulin in the lower abdomen but not in the area around the umbilicus She states that she has been compliant with her medications for diabetes.  Lab Results  Component Value Date   HGBA1C 7.8 (H) 09/28/2015       Past Medical History:  Diagnosis Date  . Allergy   . Anxiety   . Cataract    starting, no surgery yet  . Cholelithiasis 2011  . Depression   . Diabetes mellitus without complication ([place])   . GERD (gastroesophageal reflux disease)   . Glucose intolerance (impaired glucose tolerance)   . HTN (hypertension)   . Hyperlipidemia   . IBS (irritable bowel syndrome)     Current Outpatient Prescriptions  Medication Sig Dispense Refill  . ALPRAZolam (XANAX) 1 MG tablet Take 0.5-1 tablets (0.5-1 mg total) by mouth 2 (two) times daily as needed for sleep or anxiety. 60 tablet 3  . BIOTIN FORTE PO Take 500 mg by mouth daily.    . bisacodyl (DULCOLAX) 5 MG EC tablet Take 1 tablet (5 mg total) by mouth daily as needed  for moderate constipation. 30 tablet 3  . ibuprofen (ADVIL,MOTRIN) 200 MG tablet Take 400 mg by mouth every 6 (six) hours as needed for headache.    . Insulin Detemir (LEVEMIR FLEXPEN) 100 UNIT/ML Pen Inject 16 Units into the skin daily with breakfast. (Patient taking differently: Inject 26 Units into the skin daily at 10 pm. ) 15 mL 2  . Insulin Pen Needle 31G X 5 MM MISC Use with Levemir pen 100 each 0  . Krill Oil 1000 MG CAPS Take 1,000 mg by mouth daily.    Glory Rosebush VERIO test strip 1 each by Other route 2 (two) times daily.     . hydrocortisone 2.5 % cream Apply topically 2 (two) times daily. 30 g 0   Current Facility-Administered Medications  Medication Dose Route Frequency Provider Last Rate Last Dose  . 0.9 %  sodium chloride infusion  500 mL Intravenous Continuous Manus Gunning, MD        Allergies:  Allergies  Allergen Reactions  . Ciprofloxacin     REACTION: itching  . Crestor [Rosuvastatin Calcium]     arthralgia  . Hydrochlorothiazide     REACTION: hair loss  . Invokana [Canagliflozin]     ketoacidosis  . Paroxetine     REACTION: living in glass  .  Penicillins Hives  . Simvastatin     REACTION: aches with statins    Past Surgical History:  Procedure Laterality Date  . ABDOMINAL HYSTERECTOMY    . CHOLECYSTECTOMY N/A 07/03/2012   Procedure: LAPAROSCOPIC CHOLECYSTECTOMY WITH INTRAOPERATIVE CHOLANGIOGRAM;  Surgeon: Zenovia Jarred, MD;  Location: [facility];  Service: General;  Laterality: N/A;  . COLONOSCOPY    . cyst removed     x3  . OOPHORECTOMY      Social History   Social History  . Marital status: Married    Spouse name: N/A  . Number of children: 1  . Years of education: N/A   Occupational History  . [employer]     Wks w/husband   Social History Main Topics  . Smoking status: Never Smoker  . Smokeless tobacco: Never Used  . Alcohol use Yes     Comment: very little  . Drug use: No  . Sexual activity: Yes   Other Topics Concern    . None   Social History Narrative  . None    Review of Systems  Constitutional: Negative for chills and fever.  Eyes: Negative for blurred vision and double vision.  Cardiovascular: Negative for chest pain and palpitations.  Gastrointestinal: Negative for nausea and vomiting.  Skin: Positive for itching and rash.  Neurological: Negative for dizziness and headaches.  Psychiatric/Behavioral: Negative for depression and hallucinations.    Objective: Vitals:   02/01/16 0844  BP: (!) 128/94  Pulse: 66  Resp: 16  Temp: 97.9 F (36.6 C)  TempSrc: Oral  SpO2: 96%  Weight: 195 lb (88.5 kg)  Height: 5' 9.5" (1.765 m)   Body mass index is 28.38 kg/m.  Physical Exam  Constitutional: She is oriented to person, place, and time. She appears well-developed and well-nourished.  HENT:  Head: Normocephalic and atraumatic.  Eyes: Conjunctivae and EOM are normal.  Cardiovascular: Normal rate, regular rhythm and normal heart sounds.   No murmur heard. Pulmonary/Chest: Effort normal and breath sounds normal. No respiratory distress. She has no wheezes.  Abdominal: Soft. Bowel sounds are normal. She exhibits no distension and no mass. There is no tenderness. There is no guarding.  Musculoskeletal: Normal range of motion. She exhibits no edema.  Neurological: She is alert and oriented to person, place, and time.  Skin: Capillary refill takes less than 2 seconds.  Circumferential area of erythema around the umbilicus      Assessment and Plan Tommia was seen today for rash.  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     [place]

## 2016-02-01 NOTE — Patient Instructions (Addendum)
IF you received an x-ray today, you will receive an invoice from [facility]. Please contact [facility] at [phone] with questions or concerns regarding your invoice.   IF you received labwork today, you will receive an invoice from [employer]. Please contact [facility] at [phone] with questions or concerns regarding your invoice.   Our billing staff will not be able to assist you with questions regarding bills from these companies.  You will be contacted with the lab results as soon as they are available. The fastest way to get your results is to activate your My Chart account. Instructions are located on the last page of this paperwork. If you have not heard from [place] regarding the results in 2 weeks, please contact this office.      Contact Dermatitis Introduction Dermatitis is redness, soreness, and swelling (inflammation) of the skin. Contact dermatitis is a reaction to certain substances that touch the skin. There are two types of contact dermatitis:  Irritant contact dermatitis. This type is caused by something that irritates your skin, such as dry hands from washing them too much. This type does not require previous exposure to the substance for a reaction to occur. This type is more common.  Allergic contact dermatitis. This type is caused by a substance that you are allergic to, such as a nickel allergy or poison ivy. This type only occurs if you have been exposed to the substance (allergen) before. Upon a repeat exposure, your body reacts to the substance. This type is less common. What are the causes? Many different substances can cause contact dermatitis. Irritant contact dermatitis is most commonly caused by exposure to:  Makeup.  Soaps.  Detergents.  Bleaches.  Acids.  Metal salts, such as nickel. Allergic contact dermatitis is most commonly caused by exposure to:  Poisonous  plants.  Chemicals.  Jewelry.  Latex.  Medicines.  Preservatives in products, such as clothing. What increases the risk? This condition is more likely to develop in:  People who have jobs that expose them to irritants or allergens.  People who have certain medical conditions, such as asthma or eczema. What are the signs or symptoms? Symptoms of this condition may occur anywhere on your body where the irritant has touched you or is touched by you. Symptoms include:  Dryness or flaking.  Redness.  Cracks.  Itching.  Pain or a burning feeling.  Blisters.  Drainage of small amounts of blood or clear fluid from skin cracks. With allergic contact dermatitis, there may also be swelling in areas such as the eyelids, mouth, or genitals. How is this diagnosed? This condition is diagnosed with a medical history and physical exam. A patch skin test may be performed to help determine the cause. If the condition is related to your job, you may need to see an occupational medicine specialist. How is this treated? Treatment for this condition includes figuring out what caused the reaction and protecting your skin from further contact. Treatment may also include:  Steroid creams or ointments. Oral steroid medicines may be needed in more severe cases.  Antibiotics or antibacterial ointments, if a skin infection is present.  Antihistamine lotion or an antihistamine taken by mouth to ease itching.  A bandage (dressing). Follow these instructions at home: Yale your skin as needed.  Apply cool compresses to the affected areas.  Try taking a bath with:  Epsom salts. Follow the instructions on the packaging. You can get these at  your local pharmacy or grocery store.  Baking soda. Pour a small amount into the bath as directed by your health care provider.  Colloidal oatmeal. Follow the instructions on the packaging. You can get this at your local pharmacy or grocery  store.  Try applying baking soda paste to your skin. Stir water into baking soda until it reaches a paste-like consistency.  Do not scratch your skin.  Bathe less frequently, such as every other day.  Bathe in lukewarm water. Avoid using hot water. Medicines  Take or apply over-the-counter and prescription medicines only as told by your health care provider.  If you were prescribed an antibiotic medicine, take or apply your antibiotic as told by your health care provider. Do not stop using the antibiotic even if your condition starts to improve. General instructions  Keep all follow-up visits as told by your health care provider. This is important.  Avoid the substance that caused your reaction. If you do not know what caused it, keep a journal to try to track what caused it. Write down:  What you eat.  What cosmetic products you use.  What you drink.  What you wear in the affected area. This includes jewelry.  If you were given a dressing, take care of it as told by your health care provider. This includes when to change and remove it. Contact a health care provider if:  Your condition does not improve with treatment.  Your condition gets worse.  You have signs of infection such as swelling, tenderness, redness, soreness, or warmth in the affected area.  You have a fever.  You have new symptoms. Get help right away if:  You have a severe headache, neck pain, or neck stiffness.  You vomit.  You feel very sleepy.  You notice red streaks coming from the affected area.  Your bone or joint underneath the affected area becomes painful after the skin has healed.  The affected area turns darker.  You have difficulty breathing. This information is not intended to replace advice given to you by your health care provider. Make sure you discuss any questions you have with your health care provider. Document Released: 02/04/2000 Document Revised: 07/15/2015 Document  Reviewed: 06/24/2014  2017 Elsevier

## 2016-02-04 DIAGNOSIS — E119 Type 2 diabetes mellitus without complications: Secondary | ICD-10-CM | POA: Diagnosis not present

## 2016-02-04 DIAGNOSIS — Z794 Long term (current) use of insulin: Secondary | ICD-10-CM | POA: Diagnosis not present

## 2016-02-04 DIAGNOSIS — E663 Overweight: Secondary | ICD-10-CM | POA: Diagnosis not present

## 2016-02-17 ENCOUNTER — Ambulatory Visit (INDEPENDENT_AMBULATORY_CARE_PROVIDER_SITE_OTHER): Payer: Medicare Other | Admitting: Family Medicine

## 2016-02-17 VITALS — BP 128/74 | HR 83 | Temp 99.1°F | Resp 16 | Wt 115.0 lb

## 2016-02-17 DIAGNOSIS — J019 Acute sinusitis, unspecified: Secondary | ICD-10-CM

## 2016-02-17 DIAGNOSIS — Z681 Body mass index (BMI) 19 or less, adult: Secondary | ICD-10-CM

## 2016-02-17 MED ORDER — AZITHROMYCIN 250 MG OR TABS
ORAL_TABLET | ORAL | 0 refills | Status: DC
Start: 2016-02-17 — End: 2016-03-01

## 2016-02-17 NOTE — Progress Notes (Signed)
Pt roomed, medications, and allergies verified and updated by August Luz, MA-C  August Luz, 02/17/2016 10:10 AM

## 2016-02-17 NOTE — Patient Instructions (Addendum)
It was a pleasure to see you in clinic today. Your Medical Assistant was: Karsten Fells can schedule an appointment to see [place] by calling [phone] or via eCare.     If labs were ordered today the results are expected to be available via eCare 5 days later. Otherwise, result letters are mailed 7-10 days after your tests are completed. If your physician needs to change your care based on your results, you will receive a phone call to notify you. If you haven't heard from him/her and it has been more than 10 days please give [place] a call.     Thank you for choosing [facility].     Urgent Care    Your medical care was provided today by:  Buelah Manis, MD    Unless otherwise instructed, please see your regular Provider in 2-3 days or sooner if your symptoms persist, worsen or change dramatically.   Continuity of medical care in one setting and by one Provider is optimal. Follow-up care is advised through your regular Provider.     If your symptoms worsen or concern, go to the nearest Hospital Emergency Department (ER) or return to the Urgent [facility].     Always call 9-1-1 immediately if you develop a life threatening emergency.   Unless told otherwise please take all medications as directed and complete prescription therapies.   Watch for the following signs that require additional evaluation: progressive lethargy or unresponsiveness, stiff neck, localized pain (chest, abdomen), shortness of breath, painful breathing, progressive vomiting with weakness, bloody stools, or new rash.   If you are prescribed pain medication or any other medication that is sedating, do not take medication before or while operating a vehicle or heavy machinery or equipment due to potential side effects such as drowsiness and/or dizziness.   If laboratory test or imaging was ordered for you, we will call with the results of any abnormal laboratory test.    If your Primary Care Provider is not a [facility] Medicine  Provider they may also request a copy of the labs or imaging reports which we will be happy to fax or mail to them.   Additional specified special instructions are listed below:      SINUSITIS:  You have sinusitis, an infection of the sinus cavities around the nose. This infection usually follows a respiratory illness; it can also be related to allergies, changes in atmospheric pressure (flying, diving), or anything that blocks nasal drainage. Symptoms include: headache, facial pain, a thick nasal discharge, congestion, and cough.    The treatment includes antibiotic therapy, increasing oral fluids, and pain medication if needed. Nose spray decongestants (Afrin, Neo-Synephrine) or oral decongestants (Sudafed) may be needed to reduce congestion and drainage. Rarely the sinus must be irrigated to remove the infected material.     Sinusitis can lead to serious complications by spreading to other areas such as the eye or brain. Please call your doctor or return here right away if you have any of the following more serious symptoms:    * Unusual swelling around the eye or trouble seeing.  * Increasing pain, severe headache, or toothache.  * Nausea, vomiting, or unusual drowsiness.

## 2016-02-17 NOTE — Progress Notes (Signed)
Nursing note reviewed.  See nursing Hx for further subjective Hx.    SUBJECTIVE:  Paula Bowman is a 65 year old female who presents with URI symptoms.     Onset of symptoms:  2 weeks.   Symptoms include sinus and nasal congestion, sore throat, post nasal drip, headache, bilateral sinus pain, suspected fevers but not measured at home and productive cough with SOB feeling, green blood tinged nasal discharge .   Symptoms denied:  wheezing and otalgia  Course of illness is worse last 2 days.    ROS:  General:  No chills, night sweats or unexplained weight loss..  Skin:  No rashes.  Eyes:   No discharge or vision changes.  ENT:  As noted in HPI above.  Respiratory:  As noted in HPI above .    Social History     Social History   . Marital status: Soil scientist     Spouse name: N/A   . Number of children: N/A   . Years of education: N/A     Occupational History   . Not on file.     Social History Main Topics   . Smoking status: Former Smoker     Packs/day: 2.00     Years: 15.00     Types: Cigarettes     Quit date: 01/07/1988   . Smokeless tobacco: Never Used   . Alcohol use 0.5 oz/week     1 Standard drinks or equivalent per week      Comment: 2 glasses of wine daily   . Drug use: No   . Sexual activity: Yes     Partners: Female     Other Topics Concern   . 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    Pt denies any other relevant past medical history, which includes diabetes; cancer;  heart, liver, kidney, or respiratory disease; hypertension; stroke; or peptic ulcers.      Current Outpatient Prescriptions:   .  ALPRAZolam (XANAX OR), , Disp: , Rfl:   .  Azithromycin 250 MG Oral Tab, Take 2 tablets today, then take 1 tablet every day until gone. (Patient not taking: Reported on 02/17/2016), Disp: 6 tablet, Rfl: 0  .  Colestipol HCl 1 G Oral Tab, Take 1 tablet by mouth  daily., Disp: , Rfl:   .  CycloSPORINE 0.05 % Ophthalmic Emulsion, Place 1 drop in each EYE every 12 hours., Disp: , Rfl:   .  Esomeprazole Magnesium (NEXIUM) 40 MG Oral CAPSULE DELAYED RELEASE, One every other day for acid reflux, Disp: , Rfl:   .  Fluticasone Propionate 50 MCG/ACT Nasal Suspension, Spray 2 sprays into each nostril daily. For prevention/control of nasal/sinus congestion, Disp: 3 Inhaler, Rfl: 3  .  Ipratropium Bromide 0.06 % Nasal Solution, Spray 2 sprays into each nostril daily., Disp: 1 bottle, Rfl: 2  .  MetroNIDAZOLE 0.75 % External Cream, Apply to affected area on face 2 times a day. Use for 7-10 days, Disp: 45 g, Rfl: 0  .  Olopatadine HCl 0.1 % Ophthalmic Solution, Place 1 drop in each EYE 2 times a day. as needed (the azelastine is not tolerated), Disp: , Rfl:   .  TraMADol HCl 50 MG Oral Tab, Take 1 tablet (50 mg) by mouth every 6 hours as needed for pain. (Patient not taking: Reported on 02/17/2016), Disp: 10 tablet, Rfl: 0    Review of patient's allergies indicates:  Allergies   Allergen Reactions   . Amoxicillin Rash   . Levofloxacin Itching     Redness up arm from IV dose   . Penicillin G Rash   . Terbinafine Hcl      Loss of sense of taste       Primary care provider is listed as Leary Roca, MD.     PHYSICAL FINDINGS:    BP 128/74   Pulse 83   Temp 99.1 F (37.3 C) (Temporal)   Resp 16   Wt 115 lb (52.2 kg) Comment: per pt report  SpO2 99%   BMI 17.84 kg/m     General Appearance:  relaxed, cooperative, Alert; no apparent distress.     EYES:  Lids/periorbital skin normal, Conjunctivae/corneas clear.  Ears:   External ears normal. Canals clear. TM's normal..  Nose: Nares normal. Mucosa reddened. No drainage; bilateral frontal sinus tenderness.   Pharynx:  Lips, mucosa, and tongue normal., Posterior pharynx without erythema or exudate;  NECK--  supple, without adenopathy.  Lungs:  Good diaphragmatic excursion. Lungs clear to auscultation.Marland Kitchen   Heart:  Regular rate and  rhythm; no murmur, rub or gallop.    Rapid Strep: not ordered.     XRAY:   Not indicated.        ASSESSMENT:  (J01.90) Acute non-recurrent sinusitis, unspecified location  (primary encounter diagnosis)      PLAN:  Patient Instructions (AVS) provided to the patient  Medications Prescribed see orders  Follow up with PCP: Leary Roca, MD, as directed.  Follow up sooner if getting worse.   May return to Urgent Care if unable to see PCP as directed.    Trey Sailors, MD  [facility]  Urgent Care

## 2016-03-01 ENCOUNTER — Encounter (INDEPENDENT_AMBULATORY_CARE_PROVIDER_SITE_OTHER): Payer: Self-pay | Admitting: Family Medicine

## 2016-03-01 ENCOUNTER — Ambulatory Visit (INDEPENDENT_AMBULATORY_CARE_PROVIDER_SITE_OTHER): Payer: Medicare Other | Admitting: Family Medicine

## 2016-03-01 VITALS — BP 113/71 | HR 71 | Temp 98.8°F | Resp 16 | Wt 116.0 lb

## 2016-03-01 DIAGNOSIS — R142 Eructation: Secondary | ICD-10-CM

## 2016-03-01 DIAGNOSIS — R42 Dizziness and giddiness: Secondary | ICD-10-CM

## 2016-03-01 DIAGNOSIS — K8020 Calculus of gallbladder without cholecystitis without obstruction: Secondary | ICD-10-CM

## 2016-03-01 DIAGNOSIS — K219 Gastro-esophageal reflux disease without esophagitis: Secondary | ICD-10-CM

## 2016-03-01 DIAGNOSIS — M549 Dorsalgia, unspecified: Secondary | ICD-10-CM

## 2016-03-01 NOTE — Progress Notes (Signed)
Paula Bowman is a 66 year old female who presents for evaluation of belching concerns   Subjective:   Difficult for her to explain but she is worried due to the symptoms  She almost passed out a couple of times  [facility] describes a long h/o Jerrye Bushy for about 25 years   Walked into an er one day with belching and terrible pain in the chest.   Finally treated with nexium and it took care of hte problme; has tried going off it and it comes back on. She has been able to get to every other day.   When having bad symptoms she has a band around her chest; she constantly has some belching symptoms. This am she was making the bed and she pulled a muscle in the left medial scapular area; gets that from time to time and when that comes on she has more belching.   Two weeks ago sitting at a bridge table; felt the belching coming up; then did it and it gets stuck. She quickly felt light headed; this has happened before.   Once the air passes she does better.   The light headedness passes  Sitting is more difficult to get the air up  Standing easier  Did happen driving the other day.   Stopped driving and recovered quickly.     Felt a little light headed after that but thinks it was more a panic symptom.   Prior treatments was based on insurance; prilosec; prevacid; eventually went to the Nexium.   Currently using it every day: once daily.   Used so long she does take vitamins and calcium to avoid complications.     Wakes up and not too bad; but starts to belch right off.   Denies stomach pain; but can get it with flare of the microscopic colitis.   No nausea.   Currently her diarrhea is stable. Better overall.     After eating breakfast/ meals; she is worse.   After meals she goes to power walk and struggles right now with that.   Diet is healthy; gluten free foods; protein like egg; dried fruit; no junk food; all fresh foods; veggies steamed; chicken and fish usually.   She has not noted any particular foods that make her  belch more.       The light headedness; difficult also to describe; denies additional symptoms.   After she can sense her heart is going fast ( has had this worked up before).   Her breathing is normal; no pain with taking a breath; no cough normally; just over a sinus infection for which she had abtx.     Last egd done years and years ago  She had an epigastric area hernia repaired years ago; never helped the belching.         Past Medical History:   Diagnosis Date   . Esophageal reflux    . Malignant neoplasm of breast (female), unspecified site [facility]) 2008    lumpectomy: Stage 0; Radiation; was ERP; She did not complete Tamoxifen due to side effects x1 yr.    . Microscopic colitis      Past Surgical History:   Procedure Laterality Date   . BREAST LUMPECTOMY     . COLONOSCOPY STOMA DX INCLUDING COLLJ [place] SPX  2006    [place] , FU 10 years   . HERNIA REPAIR     . Knee, R knee at age 31, removed floating bone     .  TOTAL ABDOMINAL HYSTERECT W/WO RMVL TUBE OVARY      for fibroids   . UNLISTED PROCEDURE FEMUR/KNEE Right 1969   . VAGINAL HYSTERECTOMY UTERUS 250 GM/<  1994    Ovaries in place     Social History     Social History   . Marital status: Soil scientist     Spouse name: N/A   . Number of children: N/A   . Years of education: N/A     Occupational History   . Not on file.     Social History Main Topics   . Smoking status: Former Smoker     Packs/day: 2.00     Years: 15.00     Types: Cigarettes     Quit date: 01/07/1988   . Smokeless tobacco: Never Used   . Alcohol use 0.5 oz/week     1 Standard drinks or equivalent per week      Comment: 2 glasses of wine daily   . Drug use: No   . Sexual activity: Yes     Partners: Female     Other Topics Concern   . 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 Was not tested for  BRCA       Negative family history of: Colon Cancer        ROS:   Const; no fever; normal appetite.   Neuro; no headaches  Ent; recovering from sinusitis.   Cardiac; above  Pulm; above  Gi; above.   Gu; negative  Heme; negative.   Derm; negative for rash.   Psych; stressed about this issue.         EXAM  BP 113/71   Pulse 71   Temp 98.8 F (37.1 C) (Temporal)   Resp 16   Wt 116 lb (52.6 kg) Comment: reported by patient. pt declines weight measurement  SpO2 100%   BMI 17.99 kg/m   GEN: no acute distress,pleasant   Oral; pink and moist  NECK: Supple, no adenopathy, no thyromegaly palpable  No adenopathy.   HEART: S1, S2, No Murmurs, RRR, No S3 or S4  PULMONARY: Respiratory effort normal.  Auscultation: CTA bilaterally, No Wheezes, rhonchi, or rales.  BACK: tender and tense trigger point medial scapular border: midway down from upper edge. Reproduces back symptoms  ABDOMEN: Inspection reveals a thin abdomen with midline former hernia scar for abdominal wall. , BS present with normal activity, no bruits. Soft, very mild  Left more than right upper quadrant tenderness; go guarding; no rebound; mmurphy's sign is not present., non distended, no hepatosplenomegaly, and no masses palpable.  EXTR: no edema.         ASSESSMENT/PLAN  Chronic symptoms for the  most part; reviewed labs; work up over time and in the last year  Specifically  labs/ imaging/ and f/u visits in the office.   Difficult to determine an exact cause; discussed this with her today: see below.     (R14.2) Belching  (primary encounter diagnosis)  Difficult to pin point a cause of her chronic symptoms; consider biliary ( does have stones without pain), hiatal hernia; functional  bowel disease is more likely; hesitate to use currently available prokinetic agents given the possible side effect  Best to consider return to GI for egd as it has been years  Could consider a gastroparesis diet but that would be an extreme change for her and she worries about  her microscopic colitis.       (K21.9) Gastroesophageal reflux disease without esophagitis  Chronic; not having symptoms on nexium; should continue; B12 is adequate on last labs.     (R42) Light headed  Likely having some vasovagal symptoms; norma and reassuring exam; we can certainly work up further; ( Dr. Ernestina Patches did some cardiac work up in the spring of last year), consider her anxiety as contributing.   We discussed possible carotid doppler study today as well;     (M54.9) Upper back pain on left side  Reproducible; make referred pain less likely.       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   Will monitor.     PLAN  Consult back to Gastroenterology for EGD procedure.     Consult to vascular lab for imaging of neck arteries    Continue nexium.

## 2016-03-01 NOTE — Patient Instructions (Addendum)
PLAN  Food diary for 1-2 weeks.     Consult back to Gastroenterology for EGD procedure.     Consult to vascular lab for imaging of neck arteries    Continue nexium.

## 2016-03-01 NOTE — Progress Notes (Signed)
Patient roomed by Evalina Field. Lenn Volker RMA  Allergies, medications, and social history reviewed by Evalina Field. Leeann Must RMA    Reason for visit: gerd      Cervical screening/PAP: NA  Mammo: UTD  Colon Screen: UTD  Have you seen a specialist since your last visit: NO        HM Due:   Health Maintenance   Topic Date Due   . Influenza Vaccine (1) 11/21/2015   . Breast Cancer Screening  04/21/2017   . Lipid Disorders Screening  02/03/2018   . Tetanus Vaccine  11/28/2023   . Colorectal Cancer Screening (Colonoscopy)  09/14/2024   . Zoster Vaccine  Completed   . Osteoporosis Screening  Completed   . Pneumococcal Vaccine  Completed   . Hepatitis C Screening  Addressed   . HIV Screening  Addressed   . Cervical Cancer Screening (Pap Smear)  Excluded              No future appointments.

## 2016-03-02 ENCOUNTER — Telehealth (INDEPENDENT_AMBULATORY_CARE_PROVIDER_SITE_OTHER): Payer: Self-pay | Admitting: Internal Medicine

## 2016-03-02 NOTE — Telephone Encounter (Signed)
Forwarding to the Ref Pool for review     Chloe from [facility] called regarding the referral as the Patient has called to schedule an appointment     Phone # is [phone]    Fax # is [phone]

## 2016-03-02 NOTE — Telephone Encounter (Signed)
faxed

## 2016-03-23 ENCOUNTER — Encounter (INDEPENDENT_AMBULATORY_CARE_PROVIDER_SITE_OTHER): Payer: Self-pay | Admitting: Family Medicine

## 2016-03-23 ENCOUNTER — Ambulatory Visit (INDEPENDENT_AMBULATORY_CARE_PROVIDER_SITE_OTHER): Payer: Medicare Other | Admitting: Family Medicine

## 2016-03-23 VITALS — BP 106/69 | HR 67 | Temp 98.2°F | Resp 14 | Ht 67.0 in

## 2016-03-23 DIAGNOSIS — R059 Cough, unspecified: Secondary | ICD-10-CM

## 2016-03-23 DIAGNOSIS — R05 Cough: Secondary | ICD-10-CM

## 2016-03-23 MED ORDER — BECLOMETHASONE DIPROPIONATE 80 MCG/ACT IN AERS
1.0000 | INHALATION_SPRAY | Freq: Two times a day (BID) | RESPIRATORY_TRACT | 5 refills | Status: DC
Start: 2016-03-23 — End: 2016-04-25

## 2016-03-23 NOTE — Progress Notes (Signed)
Patient roomed by Evalina Field. Brad Lieurance RMA  Allergies, medications, and social history reviewed by Evalina Field. Leeann Must RMA    Reason for visit: cough      Cervical screening/PAP:NA  Mammo: UTD  Colon Screen: UTD  Have you seen a specialist since your last visit: NO        HM Due:   Health Maintenance   Topic Date Due   . Breast Cancer Screening  04/21/2017   . Lipid Disorders Screening  02/03/2018   . Tetanus Vaccine  11/28/2023   . Colorectal Cancer Screening (Colonoscopy)  09/14/2024   . Zoster Vaccine  Completed   . Osteoporosis Screening  Completed   . Pneumococcal Vaccine  Completed   . Influenza Vaccine  Completed   . Hepatitis C Screening  Addressed   . HIV Screening  Addressed   . Cervical Cancer Screening (Pap Smear)  Excluded              Future Appointments  Date Time Provider [place]   03/23/2016 12:20 PM Dini, Santo Held, MD [id number]

## 2016-03-23 NOTE — Progress Notes (Signed)
Paula Bowman is a 66 year old female who presents for evaluation of cough  PCP Dr. Annamaria Boots    Subjective: I saw her once for dizziness belching a few weeks ago; no clear findings on a rather unremarkable exam and prior work up reviewed  We did decide to send her for a carotid doppler as she had had a cardiac work up in the last 12 months  Waiting on report    She did see GI and had endoscopy done; no findings but waiting on biopsy; ordered and " oxygen test".       She reports she is mainly here about a cough; she had a sinus infection a few weeks ago with zithromax; had a cough since; this is odd to her; feels like her asthma she had years ago  Cold seems to set this off like her asthma  She wonders if she has asthma again  If she starts to cough; she gets going and cannot stop the symptoms.   The only thing that will stop is if she lays down in bed.   She can go to sleep.   Dry cough; but almost bronchial  Is not feeling sob.   No fever  No tightness  But when she cough's she cannot catch her breath.   Cough syrup does not help.     Former smoker: 12 years; quit 30 years ago.     Past h/o asthma; 30  Years ago; related to smoking; recalls a prescription pill that helps her the most; albuterol never helped.       EXAM  BP 106/69   Pulse 67   Temp 98.2 F (36.8 C) (Temporal)   Resp 14   Ht 5\' 7"  (1.702 m)   SpO2 99%   GEN: no acute distress,pleasant   Oral: pink and moist; posterior pharynx widely patent  NECK: Supple, no adenopathy, no thyromegaly palpable  HEART: S1, S2, No Murmurs, RRR, No S3 or S4  PULMONARY: Respiratory effort normal.  Auscultation: CTA bilaterally, No Wheezes, rhonchi, or rales.    Spirometry: her FVC is 97%  FEV1 is 94%  Ratio : 98% of predicted.     ASSESSMENT/PLAN  (R05) Cough  (primary encounter diagnosis)  Non specific; not clear she is having asthma; it is possible even with her spirometry; cough variant for instance; albuterol may be something to try again but singulair  could be even a better option for her.     After discussion albuterol is not ideal; she has panic and rapid pulse worries here; singulair is another pill and does not want that  We can try an inhaled steroid.     She is hoping I have some ideas about her belching but unfortunately her work up to date has been normal; will get her the results of the doppler either later today or tomorrow.   Encouraged her to f/u with GI's recommendations.     Plan: SPMTRY W/VC EXPIRATORY FLO W/WO MXML VOL VNTJ            Inhaled steroid. QVAR: inhale one puff twice daily; rinse mouth after to avoid thrush  This takes a week or two to help but reduces the inflammation in the airways.     Dr. Ricard Dillon

## 2016-03-23 NOTE — Patient Instructions (Addendum)
Inhaled steroid. QVAR: inhale one puff twice daily; rinse mouth after to avoid thrush  This takes a week or two to help but reduces the inflammation in the airways.     Dr. Ricard Dillon          Using an Inhaler  Your healthcare provider may prescribe medicine that you breathe inusing a metered-dose inhaler (MDI). An inhaler sends a measured amount of medicine in a fine mist.  Step 1:   Shake the inhaler and remove the cap.   Take a deep breath and let it out.  Step 2:   Close your lips around the end of the inhaler mouthpiece. Or if you were told to use the "open-mouth" method, hold the inhaler 1 to 2 inches from your mouth.  Step 3:   Breathe in slowly and deeply as you press down on the inhaler to release the medicine.   Inhale fully.  Step 4:   Hold your breath for a count of10, or as long as you can comfortably.   Then breathe out slowly through your mouth.   Repeat these steps for each puff of medicine prescribed.          Important   If the inhaler is being used for the first time or has not been used for a while, prime it as directed by the product maker. You can find important information about the medicine in the package insert. This is the paper that comes with the medicine.   If you use more than one inhaler, make sure you know which one to use first.   Your healthcare provider or pharmacist can show you how to use your inhaler the right way. Even if you think you are using it the right way, it is still a good idea to check.   Date Last Reviewed: 11/21/2014   2000-2017 [employer]. [address], [place] [address]. All rights reserved. This information is not intended as a substitute for professional medical care. Always follow your healthcare professional's instructions.

## 2016-03-28 ENCOUNTER — Encounter (INDEPENDENT_AMBULATORY_CARE_PROVIDER_SITE_OTHER): Payer: Self-pay | Admitting: Family Medicine

## 2016-04-04 ENCOUNTER — Other Ambulatory Visit (INDEPENDENT_AMBULATORY_CARE_PROVIDER_SITE_OTHER): Payer: Self-pay | Admitting: Physician Assistant

## 2016-04-04 DIAGNOSIS — L719 Rosacea, unspecified: Secondary | ICD-10-CM

## 2016-04-06 MED ORDER — METRONIDAZOLE 0.75 % EX CREA
TOPICAL_CREAM | Freq: Two times a day (BID) | CUTANEOUS | 0 refills | Status: DC
Start: 2016-04-06 — End: 2016-12-07

## 2016-04-06 NOTE — Telephone Encounter (Signed)
Forwarding request to PCP

## 2016-04-17 ENCOUNTER — Other Ambulatory Visit (INDEPENDENT_AMBULATORY_CARE_PROVIDER_SITE_OTHER): Payer: Medicare Other

## 2016-04-17 ENCOUNTER — Ambulatory Visit (INDEPENDENT_AMBULATORY_CARE_PROVIDER_SITE_OTHER): Payer: Medicare Other | Admitting: Internal Medicine

## 2016-04-17 VITALS — BP 142/96 | HR 85 | Temp 98.1°F | Ht 69.5 in | Wt 195.0 lb

## 2016-04-17 DIAGNOSIS — K219 Gastro-esophageal reflux disease without esophagitis: Secondary | ICD-10-CM | POA: Diagnosis not present

## 2016-04-17 DIAGNOSIS — R0789 Other chest pain: Secondary | ICD-10-CM | POA: Diagnosis not present

## 2016-04-17 LAB — COMPREHENSIVE METABOLIC PANEL
ALBUMIN: 3.8 g/dL (ref 3.5–5.2)
ALT: 15 U/L (ref 0–35)
AST: 18 U/L (ref 0–37)
Alkaline Phosphatase: 81 U/L (ref 39–117)
BUN: 15 mg/dL (ref 6–23)
CALCIUM: 9 mg/dL (ref 8.4–10.5)
CHLORIDE: 102 meq/L (ref 96–112)
CO2: 29 meq/L (ref 19–32)
CREATININE: 0.82 mg/dL (ref 0.40–1.20)
GFR: 74.13 mL/min (ref >60.00)
Glucose, Bld: 150 mg/dL — ABNORMAL HIGH (ref 70–99)
POTASSIUM: 4.1 meq/L (ref 3.5–5.1)
Sodium: 139 mEq/L (ref 135–145)
Total Bilirubin: 0.3 mg/dL (ref 0.2–1.2)
Total Protein: 7.1 g/dL (ref 6.0–8.3)

## 2016-04-17 LAB — TROPONIN I: TNIDX: 0 ug/l (ref 0.00–0.06)

## 2016-04-17 LAB — CBC
HCT: 42.3 % (ref 36.0–46.0)
HEMOGLOBIN: 14.1 g/dL (ref 12.0–15.0)
MCHC: 33.4 g/dL (ref 30.0–36.0)
MCV: 84.7 fl (ref 78.0–100.0)
PLATELETS: 314 10*3/uL (ref 150.0–400.0)
RBC: 4.99 Mil/uL (ref 3.87–5.11)
RDW: 13.5 % (ref 11.5–15.5)
WBC: 7 10*3/uL (ref 4.0–10.5)

## 2016-04-17 LAB — MAGNESIUM: MAGNESIUM: 1.9 mg/dL (ref 1.5–2.5)

## 2016-04-17 MED ORDER — PANTOPRAZOLE SODIUM 40 MG PO TBEC: 40.0000 mg | DELAYED_RELEASE_TABLET | Freq: Every day | ORAL | 3 refills | Status: DC

## 2016-04-17 NOTE — Assessment & Plan Note (Addendum)
Not taking any PPI at this time and sounds the most likely, no pain on exam today. Rx for protonix and if no relief needs GI evaluation. It is unclear if the stomach pains are related to the chest pains or separate given she is a relatively poor historian.

## 2016-04-17 NOTE — Patient Instructions (Signed)
The EKG is not changed from last time and we are going to check the labs today. I would still like you to go see a cardiologist and likely do a stress test to make sure there are no blockages in your heart.   We have sent in the stomach medicine called protonix to see if this eliminates the stomach pains.   We would like you to follow up with Dr. Alain Marion in about 1 month to check in or sooner if the problems do not improve.

## 2016-04-17 NOTE — Progress Notes (Signed)
Pre visit review using our clinic review tool, if applicable. No additional management support is needed unless otherwise documented below in the visit note. 

## 2016-04-17 NOTE — Assessment & Plan Note (Addendum)
Old EKG with some changes. Needs new EKG today, checking CMP, CBC, troponin. Since no change in EKG will refer to cardiology for likely stress test due to risk factors of high cholesterol, diabetes, hypertension (not at goal today).

## 2016-04-17 NOTE — Progress Notes (Signed)
   Subjective:    Patient ID: Catherine Mora, female    DOB: 01-May-1950, 66 y.o.   MRN: [id number]  HPI The patient is a 66 YO female coming in for several concerns. The first concern is chest pains. She cannot describe them well at this time. Feels like a squeezing sensation with some pressure in the left chest. She cannot say how long it will last but can come at any time, lasts at least 5 minutes (but she could not say if it lasts minutes or hours). She does not know if anything provokes the pain. Sometimes with activity. Does not alleviate with rest. She does have diabetes and admits to some pain like this in the past with low potassium with DKA. Her sugars have been running normally at home and in the 100s-150 in the morning (last HgA1c 7.5% in December). Her next concern is stomach pain which has been going on for several months (she cannot say how long). They are worsening in the last 1-2 weeks. They come multiple times per day. Sometimes eating food helps. Sometimes happens after eating. Happens in the evening in bed as well. She has tried tums and this helped some. She denies fevers or chills. She denies constipation or diarrhea. No blood in her stools.   Review of Systems  Constitutional: Positive for appetite change. Negative for activity change, chills, fatigue, fever and unexpected weight change.  HENT: Negative.   Eyes: Negative.   Respiratory: Negative for cough, chest tightness, shortness of breath and wheezing.   Cardiovascular: Positive for chest pain. Negative for palpitations and leg swelling.  Gastrointestinal: Positive for abdominal pain and nausea. Negative for abdominal distention, anal bleeding, blood in stool, constipation, diarrhea, rectal pain and vomiting.  Musculoskeletal: Negative.   Skin: Negative.   Neurological: Negative.   Hematological: Negative.   Psychiatric/Behavioral: Negative.       Objective:   Physical Exam  Constitutional: She is oriented to  person, place, and time. She appears well-developed and well-nourished.  HENT:  Head: Normocephalic and atraumatic.  Nose: Nose normal.  Mouth/Throat: Oropharynx is clear and moist.  Eyes: EOM are normal.  Neck: Normal range of motion. No JVD present.  Cardiovascular: Normal rate and regular rhythm.   No murmur heard. Pulmonary/Chest: Effort normal and breath sounds normal. No respiratory distress. She has no wheezes. She has no rales. She exhibits no tenderness.  Abdominal: Soft. Bowel sounds are normal. She exhibits no distension. There is no tenderness. There is no rebound and no guarding.  Some distention mild versus adiposity. She tried to show me a rash along the waistband which appeared to be pressure markings from her jeans  Musculoskeletal: She exhibits no edema.  Lymphadenopathy:    She has no cervical adenopathy.  Neurological: She is alert and oriented to person, place, and time.  Skin: Skin is warm and dry.  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     Assessment & Plan:

## 2016-04-18 ENCOUNTER — Encounter: Payer: Self-pay | Admitting: Internal Medicine

## 2016-04-25 ENCOUNTER — Ambulatory Visit: Payer: Medicare Other | Attending: Nurse Practitioner

## 2016-04-25 ENCOUNTER — Other Ambulatory Visit: Payer: Self-pay | Admitting: Nurse Practitioner

## 2016-04-25 ENCOUNTER — Other Ambulatory Visit (INDEPENDENT_AMBULATORY_CARE_PROVIDER_SITE_OTHER): Payer: Self-pay | Admitting: Family Medicine

## 2016-04-25 DIAGNOSIS — R059 Cough, unspecified: Secondary | ICD-10-CM

## 2016-04-25 DIAGNOSIS — Z1231 Encounter for screening mammogram for malignant neoplasm of breast: Secondary | ICD-10-CM | POA: Insufficient documentation

## 2016-04-25 MED ORDER — BECLOMETHASONE DIPROP HFA 80 MCG/ACT IN AERB
1.0000 | INHALATION_SPRAY | Freq: Two times a day (BID) | RESPIRATORY_TRACT | 5 refills | Status: DC
Start: 2016-04-25 — End: 2016-07-20

## 2016-04-26 NOTE — Progress Notes (Signed)
Cardiology Office Note    Date:  04/28/2016   ID:  Catherine Mora, DOB 25-Jan-1951, MRN [id number]  PCP:  Walker Kehr, MD  Cardiologist:  Avice Funchess Martinique, MD    History of Present Illness:  Catherine Mora is a 66 y.o. female seen at the request of Dr. Sharlet Salina for evaluation of chest pain. She has a history of DM type 2, HTN, and HLD. She is a nonsmoker.  She reports recent symptoms of mid sternal chest tightness. It may come off and on for several days and is aggravating. No clear relation to activity. No dyspnea or nausea. Also was having some stomach problems and this has improved with protonix. Thinks it may be related to stress. Had similar symptoms 14 years ago when her first husband passed away. Had a nuclear stress test at that time that was normal. She is sedentary.    Past Medical History:  Diagnosis Date  . Allergy   . Anxiety   . Cataract    starting, no surgery yet  . Cholelithiasis 2011  . Depression   . Diabetes mellitus without complication ([place])   . GERD (gastroesophageal reflux disease)   . Glucose intolerance (impaired glucose tolerance)   . HTN (hypertension)   . Hyperlipidemia   . IBS (irritable bowel syndrome)     Past Surgical History:  Procedure Laterality Date  . ABDOMINAL HYSTERECTOMY    . CHOLECYSTECTOMY N/A 07/03/2012   Procedure: LAPAROSCOPIC CHOLECYSTECTOMY WITH INTRAOPERATIVE CHOLANGIOGRAM;  Surgeon: Zenovia Jarred, MD;  Location: [facility];  Service: General;  Laterality: N/A;  . COLONOSCOPY    . cyst removed     x3  . OOPHORECTOMY      Current Medications: Outpatient Medications Prior to Visit  Medication Sig Dispense Refill  . ALPRAZolam (XANAX) 1 MG tablet Take 0.5-1 tablets (0.5-1 mg total) by mouth 2 (two) times daily as needed for sleep or anxiety. 60 tablet 3  . BIOTIN FORTE PO Take 500 mg by mouth daily.    . bisacodyl (DULCOLAX) 5 MG EC tablet Take 1 tablet (5 mg total) by mouth daily as needed for moderate constipation. 30  tablet 3  . hydrocortisone 2.5 % cream Apply topically 2 (two) times daily. 30 g 0  . ibuprofen (ADVIL,MOTRIN) 200 MG tablet Take 400 mg by mouth every 6 (six) hours as needed for headache.    . Insulin Detemir (LEVEMIR FLEXPEN) 100 UNIT/ML Pen Inject 16 Units into the skin daily with breakfast. (Patient taking differently: Inject 26 Units into the skin daily at 10 pm. ) 15 mL 2  . Insulin Pen Needle 31G X 5 MM MISC Use with Levemir pen 100 each 0  . Krill Oil 1000 MG CAPS Take 1,000 mg by mouth daily.    Glory Rosebush VERIO test strip 1 each by Other route 2 (two) times daily.     . pantoprazole (PROTONIX) 40 MG tablet Take 1 tablet (40 mg total) by mouth daily. 30 tablet 3   No facility-administered medications prior to visit.      Allergies:   Ciprofloxacin; Crestor [rosuvastatin calcium]; Hydrochlorothiazide; Invokana [canagliflozin]; Paroxetine; Penicillins; and Simvastatin   Social History   Social History  . Marital status: Married    Spouse name: N/A  . Number of children: 1  . Years of education: N/A   Occupational History  . [employer]     Wks w/husband   Social History Main Topics  . Smoking status: Never Smoker  . Smokeless  tobacco: Never Used  . Alcohol use Yes     Comment: very little  . Drug use: No  . Sexual activity: Yes   Other Topics Concern  . Not on file   Social History Narrative  . No narrative on file     Family History:  The patient's family history includes Diabetes in her brother, mother, other, and sister; Stomach cancer in her paternal uncle.   ROS:   Please see the history of present illness.    ROS All other systems reviewed and are negative.   PHYSICAL EXAM:   VS:  BP (!) 150/88 (BP Location: Left Arm, Patient Position: Sitting, Cuff Size: Normal)   Pulse 75   Ht 5' 9.5" (1.765 m)   Wt 195 lb 9.6 oz (88.7 kg)   BMI 28.47 kg/m    GEN: Well nourished, well developed, in no acute distress  HEENT: normal  Neck: no JVD, carotid  bruits, or masses Cardiac: RRR; no murmurs, rubs, or gallops,no edema  Respiratory:  clear to auscultation bilaterally, normal work of breathing GI: soft, nontender, nondistended, + BS MS: no deformity or atrophy  Skin: warm and dry, no rash Neuro:  Alert and Oriented x 3, Strength and sensation are intact Psych: euthymic mood, full affect  Wt Readings from Last 3 Encounters:  04/28/16 195 lb 9.6 oz (88.7 kg)  04/17/16 195 lb (88.5 kg)  02/01/16 195 lb (88.5 kg)      Studies/Labs Reviewed:   EKG:  EKG is not  ordered today.  The ekg ordered 04/13/16 demonstrates NSR with poor R wave progression. I have personally reviewed and interpreted this study.   Recent Labs: 09/28/2015: TSH 2.62 04/17/2016: ALT 15; BUN 15; Creatinine, Ser 0.82; Hemoglobin 14.1; Magnesium 1.9; Platelets 314.0; Potassium 4.1; Sodium 139   Lipid Panel    Component Value Date/Time   CHOL 257 (H) 09/28/2015 1032   TRIG 156.0 (H) 09/28/2015 1032   HDL 48.60 09/28/2015 1032   CHOLHDL 5 09/28/2015 1032   VLDL 31.2 09/28/2015 1032   LDLCALC 177 (H) 09/28/2015 1032   LDLDIRECT 219.0 08/14/2014 1014    Additional studies/ records that were reviewed today include:    ASSESSMENT:    1. Chest pain, unspecified type   2. Essential hypertension      PLAN:  In order of problems listed above:  1. Given symptoms and multiple risk factors she has moderate risk for cardiac etiology for chest pain. I have recommended a stress Myoview to evaluate her risk further. If normal we can reassure. If abnormal I would consider cardiac cath. If diagnosed with CAD would need to consider more aggressive treatment of her lipids.     Medication Adjustments/Labs and Tests Ordered: Current medicines are reviewed at length with the patient today.  Concerns regarding medicines are outlined above.  Medication changes, Labs and Tests ordered today are listed in the Patient Instructions below. Patient Instructions  Schedule Stress  Myoview    Signed, Devansh Riese Martinique, MD  04/28/2016 2:49 PM    [facility] [address] [place], [place], [address] [phone]

## 2016-04-28 ENCOUNTER — Encounter: Payer: Self-pay | Admitting: Cardiology

## 2016-04-28 ENCOUNTER — Ambulatory Visit (INDEPENDENT_AMBULATORY_CARE_PROVIDER_SITE_OTHER): Payer: Medicare Other | Admitting: Cardiology

## 2016-04-28 VITALS — BP 150/88 | HR 75 | Ht 69.5 in | Wt 195.6 lb

## 2016-04-28 DIAGNOSIS — I10 Essential (primary) hypertension: Secondary | ICD-10-CM | POA: Diagnosis not present

## 2016-04-28 DIAGNOSIS — R079 Chest pain, unspecified: Secondary | ICD-10-CM | POA: Diagnosis not present

## 2016-04-28 NOTE — Patient Instructions (Signed)
Schedule Stress Myoview 

## 2016-05-04 ENCOUNTER — Telehealth (HOSPITAL_COMMUNITY): Payer: Self-pay

## 2016-05-04 NOTE — Telephone Encounter (Signed)
Encounter complete. 

## 2016-05-09 ENCOUNTER — Ambulatory Visit (HOSPITAL_COMMUNITY)
Admission: RE | Admit: 2016-05-09 | Discharge: 2016-05-09 | Disposition: A | Discharge status: Home / Self Care | Payer: Medicare Other | Source: Ambulatory Visit | Attending: Cardiovascular Disease | Admitting: Cardiovascular Disease

## 2016-05-09 DIAGNOSIS — R079 Chest pain, unspecified: Secondary | ICD-10-CM | POA: Insufficient documentation

## 2016-05-09 LAB — MYOCARDIAL PERFUSION IMAGING
CHL CUP NUCLEAR SDS: 0
CHL CUP NUCLEAR SRS: 1
CHL CUP NUCLEAR SSS: 1
CSEPEDS: 31 s
CSEPEW: 9 METS
CSEPPHR: 150 {beats}/min
Exercise duration (min): 8 min
LV dias vol: 71 mL (ref 46–106)
LVSYSVOL: 21 mL
MPHR: 154 {beats}/min
NUC STRESS TID: 0.95
Percent HR: 97 %
RPE: 17
Rest HR: 68 {beats}/min

## 2016-05-09 MED ORDER — TECHNETIUM TC 99M TETROFOSMIN IV KIT
31.6000 | PACK | Freq: Once | INTRAVENOUS | Status: AC | PRN
  Administered 2016-05-09: 31.6 via INTRAVENOUS
  Filled 2016-05-09: qty 32

## 2016-05-09 MED ORDER — TECHNETIUM TC 99M TETROFOSMIN IV KIT
10.6000 | PACK | Freq: Once | INTRAVENOUS | Status: AC | PRN
  Administered 2016-05-09: 10.6 via INTRAVENOUS
  Filled 2016-05-09: qty 11

## 2016-06-01 ENCOUNTER — Telehealth (INDEPENDENT_AMBULATORY_CARE_PROVIDER_SITE_OTHER): Payer: Self-pay | Admitting: Internal Medicine

## 2016-06-01 DIAGNOSIS — K219 Gastro-esophageal reflux disease without esophagitis: Secondary | ICD-10-CM

## 2016-06-01 NOTE — Telephone Encounter (Signed)
Patient came into clinic today     Patient's Esomeprazole Magnesium (NEXIUM) 40 MG Oral CAPSULE DELAYED RELEASE    Per the Insurance company needs to be refilled at the 90 days instead of the 30 days refill     Please advise

## 2016-06-02 DIAGNOSIS — Z794 Long term (current) use of insulin: Secondary | ICD-10-CM | POA: Diagnosis not present

## 2016-06-02 DIAGNOSIS — E663 Overweight: Secondary | ICD-10-CM | POA: Diagnosis not present

## 2016-06-02 DIAGNOSIS — E119 Type 2 diabetes mellitus without complications: Secondary | ICD-10-CM | POA: Diagnosis not present

## 2016-06-02 NOTE — Telephone Encounter (Signed)
PHARMACY?

## 2016-06-20 MED ORDER — ESOMEPRAZOLE MAGNESIUM 40 MG OR CPDR
DELAYED_RELEASE_CAPSULE | ORAL | 3 refills | Status: DC
Start: 2016-06-20 — End: 2016-06-22

## 2016-06-22 MED ORDER — OMEPRAZOLE 20 MG OR CPDR
20.0000 mg | DELAYED_RELEASE_CAPSULE | Freq: Every day | ORAL | 3 refills | Status: DC
Start: 2016-06-22 — End: 2017-03-29

## 2016-06-22 NOTE — Telephone Encounter (Signed)
Galeen from [facility] in [place] called. They need rx for Nexium switched to Omeprazole, generic version of medication for insurance to cover it. Routing to provider for new Rx     [phone]

## 2016-06-22 NOTE — Telephone Encounter (Signed)
done

## 2016-06-26 ENCOUNTER — Encounter: Payer: Self-pay | Admitting: Internal Medicine

## 2016-06-26 ENCOUNTER — Ambulatory Visit (INDEPENDENT_AMBULATORY_CARE_PROVIDER_SITE_OTHER): Payer: Medicare Other | Admitting: Internal Medicine

## 2016-06-26 DIAGNOSIS — R21 Rash and other nonspecific skin eruption: Secondary | ICD-10-CM

## 2016-06-26 MED ORDER — CLOTRIMAZOLE-BETAMETHASONE 1-0.05 % EX CREA: 1.0000 "application " | TOPICAL_CREAM | Freq: Two times a day (BID) | CUTANEOUS | 0 refills | Status: DC

## 2016-06-26 MED ORDER — ALPRAZOLAM 1 MG PO TABS: 0.5000 mg | ORAL_TABLET | Freq: Two times a day (BID) | ORAL | 2 refills | Status: DC | PRN

## 2016-06-26 NOTE — Assessment & Plan Note (Addendum)
2018 abd wall ?etiology Lotris[facility] Derm ref

## 2016-06-26 NOTE — Patient Instructions (Signed)
[facility] well w/Catherine Mora 

## 2016-06-26 NOTE — Progress Notes (Signed)
Subjective:  Patient ID: Catherine Mora, female    DOB: 1950-07-04  Age: 66 y.o. MRN: [id number]  CC: No chief complaint on file.   HPI Catherine Mora presents for a red spot on the abdomen x 1 year - getting bigger and darker  Outpatient Medications Prior to Visit  Medication Sig Dispense Refill  . ALPRAZolam (XANAX) 1 MG tablet Take 0.5-1 tablets (0.5-1 mg total) by mouth 2 (two) times daily as needed for sleep or anxiety. 60 tablet 3  . BIOTIN FORTE PO Take 500 mg by mouth daily.    . bisacodyl (DULCOLAX) 5 MG EC tablet Take 1 tablet (5 mg total) by mouth daily as needed for moderate constipation. 30 tablet 3  . hydrocortisone 2.5 % cream Apply topically 2 (two) times daily. 30 g 0  . ibuprofen (ADVIL,MOTRIN) 200 MG tablet Take 400 mg by mouth every 6 (six) hours as needed for headache.    . Insulin Detemir (LEVEMIR FLEXPEN) 100 UNIT/ML Pen Inject 16 Units into the skin daily with breakfast. (Patient taking differently: Inject 26 Units into the skin daily at 10 pm. ) 15 mL 2  . Insulin Pen Needle 31G X 5 MM MISC Use with Levemir pen 100 each 0  . Krill Oil 1000 MG CAPS Take 1,000 mg by mouth daily.    Catherine Mora VERIO test strip 1 each by Other route 2 (two) times daily.     . pantoprazole (PROTONIX) 40 MG tablet Take 1 tablet (40 mg total) by mouth daily. 30 tablet 3   No facility-administered medications prior to visit.     ROS Review of Systems  Constitutional: Negative for activity change, appetite change, chills, fatigue and unexpected weight change.  HENT: Negative for congestion, mouth sores and sinus pressure.   Eyes: Negative for visual disturbance.  Respiratory: Negative for cough and chest tightness.   Gastrointestinal: Negative for abdominal pain and nausea.  Genitourinary: Negative for difficulty urinating, frequency and vaginal pain.  Musculoskeletal: Negative for gait problem.  Skin: Positive for rash. Negative for pallor.  Neurological: Negative for  dizziness, tremors, weakness, numbness and headaches.  Psychiatric/Behavioral: Negative for confusion, sleep disturbance and suicidal ideas.    Objective:  BP 138/86 (BP Location: Left Arm, Patient Position: Sitting, Cuff Size: Large)   Pulse 77   Temp 98.5 F (36.9 C) (Oral)   Ht 5\' 10"  (1.778 m)   Wt 198 lb (89.8 kg)   SpO2 98%   BMI 28.41 kg/m   BP Readings from Last 3 Encounters:  06/26/16 138/86  04/28/16 (!) 150/88  04/17/16 (!) 142/96    Wt Readings from Last 3 Encounters:  06/26/16 198 lb (89.8 kg)  05/09/16 195 lb (88.5 kg)  04/28/16 195 lb 9.6 oz (88.7 kg)    Physical Exam  NAD HEENT WNL Lungs CTA Heart RRR LE no edema A/o/c Skin: Area of erythema 20x2 cm streak below umbilicus - velvety erythema  Lab Results  Component Value Date   WBC 7.0 04/17/2016   HGB 14.1 04/17/2016   HCT 42.3 04/17/2016   PLT 314.0 04/17/2016   GLUCOSE 150 (H) 04/17/2016   CHOL 257 (H) 09/28/2015   TRIG 156.0 (H) 09/28/2015   HDL 48.60 09/28/2015   LDLDIRECT 219.0 08/14/2014   LDLCALC 177 (H) 09/28/2015   ALT 15 04/17/2016   AST 18 04/17/2016   NA 139 04/17/2016   K 4.1 04/17/2016   CL 102 04/17/2016   CREATININE 0.82 04/17/2016   BUN 15  04/17/2016   CO2 29 04/17/2016   TSH 2.62 09/28/2015   HGBA1C 7.8 (H) 09/28/2015   MICROALBUR 6.8 (H) 09/28/2015    No results found.  Assessment & Plan:   There are no diagnoses linked to this encounter. I am having Ms. Esquivias maintain her ibuprofen, Insulin Pen Needle, ONETOUCH VERIO, Insulin Detemir, ALPRAZolam, bisacodyl, BIOTIN FORTE PO, Krill Oil, hydrocortisone, and pantoprazole.  No orders of the defined types were placed in this encounter.    Follow-up: No Follow-up on file.  Walker Kehr, MD

## 2016-06-26 NOTE — Progress Notes (Signed)
Pre visit review using our clinic review tool, if applicable. No additional management support is needed unless otherwise documented below in the visit note. 

## 2016-07-14 ENCOUNTER — Telehealth (INDEPENDENT_AMBULATORY_CARE_PROVIDER_SITE_OTHER): Payer: Self-pay | Admitting: Internal Medicine

## 2016-07-14 NOTE — Telephone Encounter (Signed)
Pharmacy called to verify that we in fact have     Beclomethasone Diprop HFA (QVAR REDIHALER) 80 MCG/ACT Inhalation AEROSOL, BREATH ACTIVATED    Showing on our end       Verified       Closing TE

## 2016-07-20 ENCOUNTER — Telehealth (INDEPENDENT_AMBULATORY_CARE_PROVIDER_SITE_OTHER): Payer: Self-pay | Admitting: Internal Medicine

## 2016-07-20 DIAGNOSIS — R059 Cough, unspecified: Secondary | ICD-10-CM

## 2016-07-20 MED ORDER — FLUTICASONE PROPIONATE HFA 220 MCG/ACT IN AERO
1.0000 | INHALATION_SPRAY | Freq: Two times a day (BID) | RESPIRATORY_TRACT | 5 refills | Status: DC
Start: 2016-07-20 — End: 2018-07-16

## 2016-07-20 NOTE — Telephone Encounter (Signed)
Spoke with the Pharmacy verified that Dr. Ricard Dillon did authorize Flovent     Closing TE

## 2016-07-20 NOTE — Telephone Encounter (Signed)
Forwarding to Dr. Ricard Dillon     Patients insurance has changed and Qvar is no longer covered - Flovent is covered     Please advise ?

## 2016-07-20 NOTE — Telephone Encounter (Signed)
Changed from qvar to flovent

## 2016-07-27 DIAGNOSIS — D225 Melanocytic nevi of trunk: Secondary | ICD-10-CM | POA: Diagnosis not present

## 2016-07-27 DIAGNOSIS — D1801 Hemangioma of skin and subcutaneous tissue: Secondary | ICD-10-CM | POA: Diagnosis not present

## 2016-07-27 DIAGNOSIS — L304 Erythema intertrigo: Secondary | ICD-10-CM | POA: Diagnosis not present

## 2016-09-04 DIAGNOSIS — S86812A Strain of other muscle(s) and tendon(s) at lower leg level, left leg, initial encounter: Secondary | ICD-10-CM | POA: Diagnosis not present

## 2016-09-04 DIAGNOSIS — E119 Type 2 diabetes mellitus without complications: Secondary | ICD-10-CM | POA: Diagnosis not present

## 2016-09-04 DIAGNOSIS — S86912A Strain of unspecified muscle(s) and tendon(s) at lower leg level, left leg, initial encounter: Secondary | ICD-10-CM | POA: Diagnosis not present

## 2016-09-04 DIAGNOSIS — R600 Localized edema: Secondary | ICD-10-CM | POA: Diagnosis not present

## 2016-09-04 DIAGNOSIS — M25571 Pain in right ankle and joints of right foot: Secondary | ICD-10-CM | POA: Diagnosis not present

## 2016-09-04 DIAGNOSIS — W108XXA Fall (on) (from) other stairs and steps, initial encounter: Secondary | ICD-10-CM | POA: Diagnosis not present

## 2016-09-04 DIAGNOSIS — S93401A Sprain of unspecified ligament of right ankle, initial encounter: Secondary | ICD-10-CM | POA: Diagnosis not present

## 2016-09-04 DIAGNOSIS — Z880 Allergy status to penicillin: Secondary | ICD-10-CM | POA: Diagnosis not present

## 2016-09-04 DIAGNOSIS — M25562 Pain in left knee: Secondary | ICD-10-CM | POA: Diagnosis not present

## 2016-09-04 DIAGNOSIS — S93402A Sprain of unspecified ligament of left ankle, initial encounter: Secondary | ICD-10-CM | POA: Diagnosis not present

## 2016-09-07 ENCOUNTER — Ambulatory Visit (INDEPENDENT_AMBULATORY_CARE_PROVIDER_SITE_OTHER): Payer: Medicare Other | Admitting: Family Medicine

## 2016-09-07 VITALS — BP 113/75 | HR 74 | Temp 98.6°F | Resp 14

## 2016-09-07 DIAGNOSIS — D0510 Intraductal carcinoma in situ of unspecified breast: Secondary | ICD-10-CM

## 2016-09-07 DIAGNOSIS — K219 Gastro-esophageal reflux disease without esophagitis: Secondary | ICD-10-CM

## 2016-09-07 DIAGNOSIS — T148XXA Other injury of unspecified body region, initial encounter: Secondary | ICD-10-CM

## 2016-09-07 DIAGNOSIS — R5383 Other fatigue: Secondary | ICD-10-CM

## 2016-09-07 LAB — CBC (HEMOGRAM)
Hematocrit: 40 % (ref 36–45)
Hemoglobin: 13.5 g/dL (ref 11.5–15.5)
MCH: 33.4 pg (ref 27.3–33.6)
MCHC: 34 g/dL (ref 32.2–36.5)
MCV: 98 fL (ref 81–98)
Platelet Count: 208 10*3/uL (ref 150–400)
RBC: 4.04 10*6/uL (ref 3.80–5.00)
RDW-CV: 12.1 % (ref 11.6–14.4)
WBC: 5.74 10*3/uL (ref 4.3–10.0)

## 2016-09-07 NOTE — Progress Notes (Signed)
Paula Bowman is a 66 year old female who presents to the [facility] with a Fatigue (pt c/o fatigue for over a year and she also notes bruising)  66 year old female is here today because of concerns of fatigue and bruising.  States the fatigue is been going on for about a year.  She's really not gotten into with her PCP.  She thought it might be related to her underlying issues of depression which she's been trying to work through on her own.  She had a pedicure done Tuesday and was noticed to have a large bruise on her leg.  She didn't recall hitting her leg and was worried about the bruising.  She does take a baby aspirin daily and has for 20 years.  She does not have extensive bruising elsewhere and does not have any bruising on her other leg.  She spends most of her days playing bridge.  She takes Tuesdays and Thursdays off from [place] and is here today for that reason.  She's not had blood work done in about a year and she was wondering about having that done to evaluate the bruisability.  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  She is on a retired 2-3 years.  She had a right knee replacement in the past which she said did not turn out very well.  She states she only got 85% recovery.  She does have a history of heartburn and reflux and currently takes Prilosec.  She still has occasional sore throat and a morning cough and does use an occasional inhaler.        Past Medical History:   Diagnosis Date   . Esophageal reflux    . 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    . Microscopic colitis        Outpatient Prescriptions Marked as Taking for the 09/07/16 encounter (Office Visit) with Quincy Sheehan, MD   Medication Sig Dispense Refill   . Fluticasone Propionate 50 MCG/ACT Nasal Suspension Spray 2 sprays into each nostril  daily. For prevention/control of nasal/sinus congestion 3 Inhaler 3   . Fluticasone Propionate HFA 220 MCG/ACT Inhalation Aerosol Inhale 1 puff by mouth 2 times a day. 1 Inhaler 5   . Omeprazole 20 MG Oral CAPSULE DELAYED RELEASE Take 1 capsule (20 mg) by mouth daily on an empty stomach. 90 capsule 3       Review of patient's allergies indicates:  Allergies   Allergen Reactions   . Amoxicillin Rash   . Levofloxacin Itching     Redness up arm from IV dose   . Penicillin G Rash   . Terbinafine Hcl      Loss of sense of taste       Past Surgical History:   Procedure Laterality Date   . BREAST LUMPECTOMY     . COLONOSCOPY STOMA DX INCLUDING COLLJ Ginger Blue SPX  2006    [facility] , FU 10 years   . HERNIA REPAIR     . Knee, R knee at age 54, removed floating bone     . TOTAL ABDOMINAL HYSTERECT W/WO RMVL TUBE OVARY      for fibroids   . UNLISTED PROCEDURE FEMUR/KNEE Right 1969   . VAGINAL HYSTERECTOMY UTERUS 250 GM/<  1994    Ovaries in place  Social History   Substance Use Topics   . Smoking status: Former Smoker     Packs/day: 2.00     Years: 15.00     Types: Cigarettes     Quit date: 01/07/1988   . Smokeless tobacco: Never Used   . Alcohol use 0.5 oz/week     1 Standard drinks or equivalent per week      Comment: 2 glasses of wine daily       Parts of this medical record are completed using a dragon dictation system.    Review of Systems   Constitutional: Negative for activity change, appetite change, chills, fatigue, fever and unexpected weight change.   HENT: Positive for sore throat (Occasional).    Respiratory: Positive for cough (Occasional).    Cardiovascular: Negative for leg swelling.   Gastrointestinal: Negative for blood in stool, diarrhea and nausea.   Musculoskeletal: Negative for joint swelling and myalgias.   Skin: Positive for color change. Negative for rash.   Hematological: Bruises/bleeds easily.   Psychiatric/Behavioral: Positive for sleep disturbance. Suicidal ideas: Falls asleep well but doesn't stay  asleep well.       BP 113/75   Pulse 74   Temp 98.6 F (37 C) (Temporal)   Resp 14   SpO2 96%   Physical Exam   Constitutional: She is oriented to person, place, and time. She appears well-developed and well-nourished. No distress.   HENT:   Head: Normocephalic and atraumatic.   Pharynx, TMs, nares normal.  No adenopathy noted.   Eyes: Conjunctivae are normal.   Neck: Neck supple.   Cardiovascular: Normal rate and normal heart sounds.    Pulmonary/Chest: Effort normal and breath sounds normal.   Lymphadenopathy:     She has no cervical adenopathy.   Neurological: She is alert and oriented to person, place, and time.   Skin: Skin is warm and dry.   One 4 cm diameter bruise located on the left lower leg.  There is no additional bruising noted elsewhere on the arms or legs.   Psychiatric: She has a normal mood and affect. Her behavior is normal. Judgment normal.   Nursing note and vitals reviewed.    (R53.83) Fatigue, unspecified type  (primary encounter diagnosis)  Plan: CBC (HEMOGRAM), COMPREHENSIVE METABOLIC PANEL,         THYROID STIMULATING HORMONE            (T14.8XXA) Bruising  Plan: CBC (HEMOGRAM)            (K21.9) Gastroesophageal reflux disease without esophagitis  Plan: Had considered her switching to Pepcid but she states she still has intermittent symptoms of esophagitis.  I did discuss additional reflux precautions such as elevation of the head of the bed and considered additional antacid such as Rolaids at bedtime.    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  Her mammograms have been benign        Patient Instructions   It was a pleasure to see you in clinic today. Your Medical Assistant was: Karsten Fells can schedule an appointment to see [place] by calling [phone] or via eCare.     If labs were ordered today  the results are expected to be available via eCare 5 days later. Otherwise,  result letters are mailed 7-10 days after your tests are completed. If your physician needs to change your care based on your results, you will receive a phone call to notify you. If you haven't heard from him/her and it has been more than 10 days please give [place] a call.     Thank you for choosing [facility].       Have blood drawn prior to leaving today  Follow up with your PCP to address the issues of depression and sleep after the tests return.

## 2016-09-07 NOTE — Progress Notes (Signed)
Pt roomed, medications, and allergies verified and updated by August Luz, MA-C  August Luz, 09/07/2016 2:04 PM

## 2016-09-07 NOTE — Patient Instructions (Addendum)
It was a pleasure to see you in clinic today. Your Medical Assistant was: Karsten Fells can schedule an appointment to see Paula Bowman by calling [phone] or via eCare.     If labs were ordered today the results are expected to be available via eCare 5 days later. Otherwise, result letters are mailed 7-10 days after your tests are completed. If your physician needs to change your care based on your results, you will receive a phone call to notify you. If you haven't heard from him/her and it has been more than 10 days please give Paula Bowman a call.     Thank you for choosing [facility].       Have blood drawn prior to leaving today  Follow up with your PCP to address the issues of depression and sleep after the tests return.

## 2016-09-08 LAB — COMPREHENSIVE METABOLIC PANEL
ALT (GPT): 16 U/L (ref 7–33)
AST (GOT): 21 U/L (ref 9–38)
Albumin: 4.4 g/dL (ref 3.5–5.2)
Alkaline Phosphatase (Total): 53 U/L (ref 38–172)
Anion Gap: 8 (ref 4–12)
Bilirubin (Total): 0.8 mg/dL (ref 0.2–1.3)
Calcium: 10.1 mg/dL (ref 8.9–10.2)
Carbon Dioxide, Total: 30 meq/L (ref 22–32)
Chloride: 102 meq/L (ref 98–108)
Creatinine: 0.64 mg/dL (ref 0.38–1.02)
GFR, Calc, African American: >60 mL/min/{1.73_m2} (ref >59)
GFR, Calc, European American: >60 mL/min/{1.73_m2} (ref >59)
Glucose: 96 mg/dL (ref 62–125)
Potassium: 4.3 meq/L (ref 3.6–5.2)
Protein (Total): 7 g/dL (ref 6.0–8.2)
Sodium: 140 meq/L (ref 135–145)
Urea Nitrogen: 17 mg/dL (ref 8–21)

## 2016-09-08 LAB — THYROID STIMULATING HORMONE: Thyroid Stimulating Hormone: 2.139 u[IU]/mL (ref 0.400–5.000)

## 2016-09-29 ENCOUNTER — Encounter: Payer: Medicare Other | Admitting: Internal Medicine

## 2016-10-03 ENCOUNTER — Other Ambulatory Visit (INDEPENDENT_AMBULATORY_CARE_PROVIDER_SITE_OTHER): Payer: Medicare Other

## 2016-10-03 ENCOUNTER — Ambulatory Visit (INDEPENDENT_AMBULATORY_CARE_PROVIDER_SITE_OTHER): Payer: Medicare Other | Admitting: Internal Medicine

## 2016-10-03 VITALS — BP 128/68 | HR 68 | Temp 98.1°F | Ht 70.0 in | Wt 195.0 lb

## 2016-10-03 DIAGNOSIS — J301 Allergic rhinitis due to pollen: Secondary | ICD-10-CM | POA: Diagnosis not present

## 2016-10-03 DIAGNOSIS — E119 Type 2 diabetes mellitus without complications: Secondary | ICD-10-CM

## 2016-10-03 DIAGNOSIS — Z23 Encounter for immunization: Secondary | ICD-10-CM

## 2016-10-03 DIAGNOSIS — J309 Allergic rhinitis, unspecified: Secondary | ICD-10-CM | POA: Insufficient documentation

## 2016-10-03 DIAGNOSIS — Z794 Long term (current) use of insulin: Secondary | ICD-10-CM

## 2016-10-03 DIAGNOSIS — Z0000 Encounter for general adult medical examination without abnormal findings: Secondary | ICD-10-CM | POA: Diagnosis not present

## 2016-10-03 LAB — CBC WITH DIFFERENTIAL/PLATELET
BASOS ABS: 0.1 10*3/uL (ref 0.0–0.1)
Basophils Relative: 1.8 % (ref 0.0–3.0)
EOS ABS: 0.3 10*3/uL (ref 0.0–0.7)
Eosinophils Relative: 5.1 % — ABNORMAL HIGH (ref 0.0–5.0)
HEMATOCRIT: 43.7 % (ref 36.0–46.0)
Hemoglobin: 14.4 g/dL (ref 12.0–15.0)
LYMPHS PCT: 33.5 % (ref 12.0–46.0)
Lymphs Abs: 2 10*3/uL (ref 0.7–4.0)
MCHC: 33.1 g/dL (ref 30.0–36.0)
MCV: 85.8 fl (ref 78.0–100.0)
Monocytes Absolute: 0.3 10*3/uL (ref 0.1–1.0)
Monocytes Relative: 5.6 % (ref 3.0–12.0)
NEUTROS ABS: 3.3 10*3/uL (ref 1.4–7.7)
NEUTROS PCT: 54 % (ref 43.0–77.0)
PLATELETS: 281 10*3/uL (ref 150.0–400.0)
RBC: 5.09 Mil/uL (ref 3.87–5.11)
RDW: 13.6 % (ref 11.5–15.5)
WBC: 6 10*3/uL (ref 4.0–10.5)

## 2016-10-03 LAB — LIPID PANEL
CHOL/HDL RATIO: 6
Cholesterol: 273 mg/dL — ABNORMAL HIGH (ref 0–200)
HDL: 43.6 mg/dL (ref >39.00)
LDL CALC: 189 mg/dL — AB (ref 0–99)
NONHDL: 229.05
Triglycerides: 199 mg/dL — ABNORMAL HIGH (ref 0.0–149.0)
VLDL: 39.8 mg/dL (ref 0.0–40.0)

## 2016-10-03 LAB — BASIC METABOLIC PANEL
BUN: 23 mg/dL (ref 6–23)
CALCIUM: 9.2 mg/dL (ref 8.4–10.5)
CO2: 28 meq/L (ref 19–32)
CREATININE: 0.76 mg/dL (ref 0.40–1.20)
Chloride: 103 mEq/L (ref 96–112)
GFR: 80.81 mL/min (ref >60.00)
Glucose, Bld: 149 mg/dL — ABNORMAL HIGH (ref 70–99)
Potassium: 5.1 mEq/L (ref 3.5–5.1)
Sodium: 137 mEq/L (ref 135–145)

## 2016-10-03 LAB — HEPATIC FUNCTION PANEL
ALBUMIN: 4.1 g/dL (ref 3.5–5.2)
ALK PHOS: 80 U/L (ref 39–117)
ALT: 14 U/L (ref 0–35)
AST: 15 U/L (ref 0–37)
BILIRUBIN DIRECT: 0.2 mg/dL (ref 0.0–0.3)
BILIRUBIN TOTAL: 0.4 mg/dL (ref 0.2–1.2)
Total Protein: 7.2 g/dL (ref 6.0–8.3)

## 2016-10-03 LAB — URINALYSIS, ROUTINE W REFLEX MICROSCOPIC
BILIRUBIN URINE: NEGATIVE
KETONES UR: NEGATIVE
NITRITE: NEGATIVE
Specific Gravity, Urine: 1.015 (ref 1.000–1.030)
Total Protein, Urine: NEGATIVE
Urine Glucose: NEGATIVE
Urobilinogen, UA: 0.2 (ref 0.0–1.0)
pH: 5.5 (ref 5.0–8.0)

## 2016-10-03 LAB — TSH: TSH: 2.47 u[IU]/mL (ref 0.35–4.50)

## 2016-10-03 LAB — MICROALBUMIN / CREATININE URINE RATIO
Creatinine,U: 100.7 mg/dL
MICROALB/CREAT RATIO: 2.9 mg/g (ref 0.0–30.0)
Microalb, Ur: 2.9 mg/dL — ABNORMAL HIGH (ref 0.0–1.9)

## 2016-10-03 LAB — HEMOGLOBIN A1C: Hgb A1c MFr Bld: 7.7 % — ABNORMAL HIGH (ref 4.6–6.5)

## 2016-10-03 MED ORDER — BISACODYL 5 MG PO TBEC: 5.0000 mg | DELAYED_RELEASE_TABLET | Freq: Every day | ORAL | 3 refills | Status: AC | PRN

## 2016-10-03 MED ORDER — LORATADINE 10 MG PO TABS: 10.0000 mg | ORAL_TABLET | Freq: Every day | ORAL | 11 refills | Status: DC

## 2016-10-03 NOTE — Assessment & Plan Note (Signed)
Loratidine

## 2016-10-03 NOTE — Assessment & Plan Note (Addendum)
on Levimir  F/u w/Dr Norlene Campbell

## 2016-10-03 NOTE — Progress Notes (Signed)
Subjective:  Patient ID: Catherine Mora, female    DOB: 13-Jun-1950  Age: 66 y.o. MRN: [id number]  CC: No chief complaint on file.   HPI Catherine Mora presents for a well exam C/o sinus allergies x weeks, sneezing   Outpatient Medications Prior to Visit  Medication Sig Dispense Refill  . ALPRAZolam (XANAX) 1 MG tablet Take 0.5-1 tablets (0.5-1 mg total) by mouth 2 (two) times daily as needed for sleep or anxiety. 60 tablet 2  . BIOTIN FORTE PO Take 500 mg by mouth daily.    . bisacodyl (DULCOLAX) 5 MG EC tablet Take 1 tablet (5 mg total) by mouth daily as needed for moderate constipation. 30 tablet 3  . clotrimazole-betamethasone (LOTRISONE) cream Apply 1 application topically 2 (two) times daily. 45 g 0  . hydrocortisone 2.5 % cream Apply topically 2 (two) times daily. 30 g 0  . ibuprofen (ADVIL,MOTRIN) 200 MG tablet Take 400 mg by mouth every 6 (six) hours as needed for headache.    . Insulin Detemir (LEVEMIR FLEXPEN) 100 UNIT/ML Pen Inject 16 Units into the skin daily with breakfast. (Patient taking differently: Inject 26 Units into the skin daily at 10 pm. ) 15 mL 2  . Insulin Pen Needle 31G X 5 MM MISC Use with Levemir pen 100 each 0  . Krill Oil 1000 MG CAPS Take 1,000 mg by mouth daily.    Glory Rosebush VERIO test strip 1 each by Other route 2 (two) times daily.     . pantoprazole (PROTONIX) 40 MG tablet Take 1 tablet (40 mg total) by mouth daily. 30 tablet 3   No facility-administered medications prior to visit.     ROS Review of Systems  Constitutional: Negative for activity change, appetite change, chills, fatigue and unexpected weight change.  HENT: Positive for congestion. Negative for mouth sores and sinus pressure.   Eyes: Negative for visual disturbance.  Respiratory: Negative for cough and chest tightness.   Gastrointestinal: Negative for abdominal pain and nausea.  Genitourinary: Negative for difficulty urinating, frequency and vaginal pain.  Musculoskeletal:  Negative for back pain and gait problem.  Skin: Negative for pallor and rash.  Neurological: Negative for dizziness, tremors, weakness, numbness and headaches.  Psychiatric/Behavioral: Negative for confusion and sleep disturbance.    Objective:  BP 128/68 (BP Location: Left Arm, Patient Position: Sitting, Cuff Size: Large)   Pulse 68   Temp 98.1 F (36.7 C) (Oral)   Ht 5\' 10"  (1.778 m)   Wt 195 lb (88.5 kg)   SpO2 99%   BMI 27.98 kg/m   BP Readings from Last 3 Encounters:  10/03/16 128/68  06/26/16 138/86  04/28/16 (!) 150/88    Wt Readings from Last 3 Encounters:  10/03/16 195 lb (88.5 kg)  06/26/16 198 lb (89.8 kg)  05/09/16 195 lb (88.5 kg)    Physical Exam  Constitutional: She appears well-developed. No distress.  HENT:  Head: Normocephalic.  Right Ear: External ear normal.  Left Ear: External ear normal.  Nose: Nose normal.  Mouth/Throat: Oropharynx is clear and moist.  Eyes: Pupils are equal, round, and reactive to light. Conjunctivae are normal. Right eye exhibits no discharge. Left eye exhibits no discharge.  Neck: Normal range of motion. Neck supple. No JVD present. No tracheal deviation present. No thyromegaly present.  Cardiovascular: Normal rate, regular rhythm and normal heart sounds.   Pulmonary/Chest: No stridor. No respiratory distress. She has no wheezes.  Abdominal: Soft. Bowel sounds are normal. She exhibits no  distension and no mass. There is no tenderness. There is no rebound and no guarding.  Musculoskeletal: She exhibits no edema.  Lymphadenopathy:    She has no cervical adenopathy.  Neurological: She displays normal reflexes. No cranial nerve deficit. She exhibits normal muscle tone. Coordination normal.  Skin: No rash noted. No erythema.  Psychiatric: She has a normal mood and affect. Her behavior is normal. Judgment and thought content normal.    Lab Results  Component Value Date   WBC 7.0 04/17/2016   HGB 14.1 04/17/2016   HCT 42.3  04/17/2016   PLT 314.0 04/17/2016   GLUCOSE 150 (H) 04/17/2016   CHOL 257 (H) 09/28/2015   TRIG 156.0 (H) 09/28/2015   HDL 48.60 09/28/2015   LDLDIRECT 219.0 08/14/2014   LDLCALC 177 (H) 09/28/2015   ALT 15 04/17/2016   AST 18 04/17/2016   NA 139 04/17/2016   K 4.1 04/17/2016   CL 102 04/17/2016   CREATININE 0.82 04/17/2016   BUN 15 04/17/2016   CO2 29 04/17/2016   TSH 2.62 09/28/2015   HGBA1C 7.8 (H) 09/28/2015   MICROALBUR 6.8 (H) 09/28/2015    No results found.  Assessment & Plan:   There are no diagnoses linked to this encounter. I am having Ms. Hamrick maintain her ibuprofen, Insulin Pen Needle, ONETOUCH VERIO, Insulin Detemir, bisacodyl, BIOTIN FORTE PO, Krill Oil, hydrocortisone, pantoprazole, clotrimazole-betamethasone, and ALPRAZolam.  No orders of the defined types were placed in this encounter.    Follow-up: No Follow-up on file.  Walker Kehr, MD

## 2016-10-03 NOTE — Patient Instructions (Addendum)
Shingrix

## 2016-10-03 NOTE — Assessment & Plan Note (Signed)
Here for medicare wellness/physical  Diet: heart healthy  Physical activity: not sedentary  Depression/mood screen: negative  Hearing: intact to whispered voice  Visual acuity: grossly normal w/glasses, performs annual eye exam  ADLs: capable  Fall risk: low to none  Home safety: good  Cognitive evaluation: intact to orientation, naming, recall and repetition  EOL planning: adv directives, full code/ I agree  I have personally reviewed and have noted  1. The patient's medical, surgical and social history  2. Their use of alcohol, tobacco or illicit drugs  3. Their current medications and supplements  4. The patient's functional ability including ADL's, fall risks, home safety risks and hearing or visual impairment.  5. Diet and physical activities  6. Evidence for depression or mood disorders 7. The roster of all physicians providing medical care to patient - is listed in the Snapshot section of the chart and reviewed today.    Today patient counseled on age appropriate routine health concerns for screening and prevention, each reviewed and up to date or declined. Immunizations reviewed and up to date or declined. Labs ordered and reviewed. Risk factors for depression reviewed and negative. Hearing function and visual acuity are intact. ADLs screened and addressed as needed. Functional ability and level of safety reviewed and appropriate. Education, counseling and referrals performed based on assessed risks today. Patient provided with a copy of personalized plan for preventive services.     Ophth 2016, PAP q 12 mo

## 2016-11-09 DIAGNOSIS — H1131 Conjunctival hemorrhage, right eye: Secondary | ICD-10-CM | POA: Diagnosis not present

## 2016-11-14 DIAGNOSIS — E118 Type 2 diabetes mellitus with unspecified complications: Secondary | ICD-10-CM | POA: Diagnosis not present

## 2016-12-07 ENCOUNTER — Ambulatory Visit (INDEPENDENT_AMBULATORY_CARE_PROVIDER_SITE_OTHER): Payer: Medicare Other | Admitting: Family Medicine

## 2016-12-07 VITALS — BP 105/58 | HR 63 | Temp 97.8°F | Resp 16

## 2016-12-07 DIAGNOSIS — L03031 Cellulitis of right toe: Secondary | ICD-10-CM

## 2016-12-07 MED ORDER — CLINDAMYCIN PHOSPHATE 1 % EX SOLN
CUTANEOUS | 0 refills | Status: DC
Start: 2016-12-07 — End: 2017-01-29

## 2016-12-07 NOTE — Progress Notes (Signed)
Paula Bowman is a 66 year old female who presents to the [facility] with a Infection (pt here with toe infection x 3 months. )  66 year old female presents today with inflammation around the nail of her right second toe.  States this is been going on for about 3 months.  She gets a pedicure once a month.  She's had this off and on for couple of years now.  She has not noticed any drainage or discharge out of the edge of the toe.  She did try poking it with a pin on 1 or 2 occasions over the past couple of years.  She has no history of diabetes.  She denies numbness of the toes.  Denies any problems with the other toes of her feet.    Past Medical History:   Diagnosis Date   . Esophageal reflux    . 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    . Microscopic colitis        Outpatient Prescriptions Marked as Taking for the 12/07/16 encounter (Office Visit) with Quincy Sheehan, MD   Medication Sig Dispense Refill   . CycloSPORINE 0.05 % Ophthalmic Emulsion Place 1 drop in each EYE every 12 hours.     . Fluticasone Propionate 50 MCG/ACT Nasal Suspension Spray 2 sprays into each nostril daily. For prevention/control of nasal/sinus congestion 3 Inhaler 3   . Fluticasone Propionate HFA 220 MCG/ACT Inhalation Aerosol Inhale 1 puff by mouth 2 times a day. 1 Inhaler 5   . Olopatadine HCl 0.1 % Ophthalmic Solution Place 1 drop in each EYE 2 times a day. as needed (the azelastine is not tolerated)     . Omeprazole 20 MG Oral CAPSULE DELAYED RELEASE Take 1 capsule (20 mg) by mouth daily on an empty stomach. 90 capsule 3       Review of patient's allergies indicates:  Allergies   Allergen Reactions   . Amoxicillin Rash   . Levofloxacin Itching     Redness up arm from IV dose   . Penicillin G Rash   . Terbinafine Hcl      Loss of sense of taste       Past  Surgical History:   Procedure Laterality Date   . BREAST LUMPECTOMY     . COLONOSCOPY STOMA DX INCLUDING COLLJ [place] SPX  2006    [place] , FU 10 years   . HERNIA REPAIR     . Knee, R knee at age 36, removed floating bone     . TOTAL ABDOMINAL HYSTERECT W/WO RMVL TUBE OVARY      for fibroids   . UNLISTED PROCEDURE FEMUR/KNEE Right 1969   . VAGINAL HYSTERECTOMY UTERUS 250 GM/<  1994    Ovaries in place       Social History   Substance Use Topics   . Smoking status: Former Smoker     Packs/day: 2.00     Years: 15.00     Types: Cigarettes     Quit date: 01/07/1988   . Smokeless tobacco: Never Used   . Alcohol use 0.5 oz/week     1 Standard drinks or equivalent per week      Comment: 2 glasses of wine daily       Parts of this medical record are completed using a dragon dictation  system.    Review of Systems   Constitutional: Negative for activity change, chills and fever.   Musculoskeletal: Negative for gait problem and joint swelling.   Skin: Positive for color change.       BP 105/58   Pulse 63   Temp 97.8 F (36.6 C) (Temporal)   Resp 16   SpO2 100%   Physical Exam   Constitutional: She appears well-developed and well-nourished. No distress.   HENT:   Head: Normocephalic and atraumatic.   Musculoskeletal: Normal range of motion.   Skin: Skin is warm and dry. There is erythema.   There is mild swelling and erythema around the base the cuticle of the second toe on her right foot.  This is minimally painful and there is no evidence of fluctuance around it.  There is evidence of a [place] pedicure on all 10 toes.  She is currently wearing flip-flops.  There is no proximal inflammation and no signs of lymphangitis.  She has minimal discomfort to palpation.   Psychiatric: She has a normal mood and affect. Her behavior is normal. Judgment normal.   Nursing note and vitals reviewed.    (L03.031) Paronychia of second toe of right foot  (primary encounter diagnosis)  Plan: Clindamycin Phosphate 1 % External Solution             Patient Instructions   Thank you for choosing the Urgent Care at [facility] for your visit today.  Your Provider today was Dr Carloyn Manner  If you receive a survey in the mail please complete this as it helps [place] improve our care to you.    If you have lab tests that have not been completed at the time of your discharge, you will be contacted when they're completed.  This may be done either via eCare or via the phone.  If you had x-rays today you were given a preliminary diagnosis from the provider.  If something of significance difference is seen by the radiologist you will be contacted and those differences will be described or explained.  If you sign up with eCare you will be able to view the x-ray reports and the lab work within a few days of their completion.    If you have questions about your care you can call the [facility] clinic during office hours at [phone], or contact the [place] 24 hour number at [phone].      Please follow-up with your primary care provider in 3-5 days or sooner if you are getting worse. If you are in need of a primary care provider and would like to establish care with [facility], please call [phone].    Your specific instructions are listed below:  Do not soak the area but it is good to wash and dry it before application of the medication.    Apply Clindamycin solution to the affected digit three times daily and allow it to dry.  This usually dries in about 5 minutes.    Elevate the area to improve the circulation and decrease pain and pressure in the tissue.    If this fails to improve in the next 72 hours or appears to worsen, then have it looked at again.

## 2016-12-07 NOTE — Progress Notes (Signed)
Vitals taken, Medication and allergies verified. Patient roomed by: Viola Placeres, MA-C

## 2016-12-07 NOTE — Patient Instructions (Addendum)
Thank you for choosing the Urgent Care at [facility] for your visit today.  Your Provider today was Dr Carloyn Manner  If you receive a survey in the mail please complete this as it helps [place] improve our care to you.    If you have lab tests that have not been completed at the time of your discharge, you will be contacted when they're completed.  This may be done either via eCare or via the phone.  If you had x-rays today you were given a preliminary diagnosis from the provider.  If something of significance difference is seen by the radiologist you will be contacted and those differences will be described or explained.  If you sign up with eCare you will be able to view the x-ray reports and the lab work within a few days of their completion.    If you have questions about your care you can call the [facility] clinic during office hours at [phone], or contact the [place] 24 hour number at [phone].      Please follow-up with your primary care provider in 3-5 days or sooner if you are getting worse. If you are in need of a primary care provider and would like to establish care with [facility], please call [phone].    Your specific instructions are listed below:  Do not soak the area but it is good to wash and dry it before application of the medication.    Apply Clindamycin solution to the affected digit three times daily and allow it to dry.  This usually dries in about 5 minutes.    Elevate the area to improve the circulation and decrease pain and pressure in the tissue.    If this fails to improve in the next 72 hours or appears to worsen, then have it looked at again.

## 2016-12-11 DIAGNOSIS — Z794 Long term (current) use of insulin: Secondary | ICD-10-CM | POA: Diagnosis not present

## 2016-12-11 DIAGNOSIS — E663 Overweight: Secondary | ICD-10-CM | POA: Diagnosis not present

## 2016-12-11 DIAGNOSIS — E119 Type 2 diabetes mellitus without complications: Secondary | ICD-10-CM | POA: Diagnosis not present

## 2016-12-14 ENCOUNTER — Ambulatory Visit (INDEPENDENT_AMBULATORY_CARE_PROVIDER_SITE_OTHER): Payer: Medicare Other | Admitting: Family

## 2016-12-14 VITALS — BP 108/53 | HR 67 | Temp 97.9°F | Resp 15

## 2016-12-14 DIAGNOSIS — L03031 Cellulitis of right toe: Secondary | ICD-10-CM

## 2016-12-14 MED ORDER — SULFAMETHOXAZOLE-TRIMETHOPRIM 800-160 MG OR TABS
1.0000 | ORAL_TABLET | Freq: Two times a day (BID) | ORAL | 0 refills | Status: AC
Start: 2016-12-14 — End: 2016-12-24

## 2016-12-14 NOTE — Progress Notes (Signed)
Chief Complaint   Patient presents with   . Infection     pt c/o ongoing infection on 2nd toe of right foot    . Bleeding/Bruising     pt c/o bruising on fingers        SUBJECTIVE:  Paula Bowman is an 66 year old female who presents with right foot second digit infection. Using antibiotic lotion x 1 wk, worse. Flares up when she wears a shoe. No pain.      Review of patient's allergies indicates:  Allergies   Allergen Reactions   . Amoxicillin Rash   . Levofloxacin Itching     Redness up arm from IV dose   . Penicillin G Rash   . Terbinafine Hcl      Loss of sense of taste         Review of Systems   Skin: Positive for color change and wound.   All other systems reviewed and are negative.      I personally reviewed and confirmed the past medical history in the record with the patient today.      OBJECTIVE:  BP 108/53   Pulse 67   Temp 97.9 F (36.6 C) (Temporal)   Resp 15   SpO2 100%   Physical Exam   Constitutional: She is oriented to person, place, and time. She appears well-developed and well-nourished.   HENT:   Head: Normocephalic and atraumatic.   Eyes: Pupils are equal, round, and reactive to light. EOM are normal.   Neck: Normal range of motion. Neck supple.   Pulmonary/Chest: Effort normal.   Musculoskeletal: Normal range of motion.   Neurological: She is alert and oriented to person, place, and time.   Skin: Skin is warm and dry. There is erythema (distal 2cnd digit around nail of right foot).   Nursing note and vitals reviewed.      ASSESSMENT/PLAN  (L03.031) Cellulitis of toe of right foot  (primary encounter diagnosis)  Plan: Sulfamethoxazole-Trimethoprim (BACTRIM DS)         800-160 MG Oral Tab    (L03.031) Paronychia of toe of right foot  Plan: Sulfamethoxazole-Trimethoprim (BACTRIM DS)         800-160 MG Oral Tab    warm soapy water soaks.    Follow up Dr Annamaria Boots for finger discolorations    If symptoms worsen please go the the Emergency Department.  Discussed medication in detail  including dosing, side effects, and interactions.    Trudee Kuster, [facility]

## 2016-12-14 NOTE — Patient Instructions (Addendum)
It was a pleasure to see you in clinic today. Your Medical Assistant was: Karsten Fells can schedule an appointment to see [place] by calling [phone] or via eCare.     If labs were ordered today the results are expected to be available via eCare 5 days later. Otherwise, result letters are mailed 7-10 days after your tests are completed. If your physician needs to change your care based on your results, you will receive a phone call to notify you. If you haven't heard from him/her and it has been more than 10 days please give [place] a call.     Thank you for choosing [facility].     Patient Education     Paronychia of theFinger or Toe  Paronychia is an infection near a fingernail or toenail. It usually occurs when an opening in the cuticle or an ingrown toenail lets bacteria under the skin.  The infection will need to be drained if pus is present. If the infection has been caught early, you may need only antibiotic treatment. Healing will take about 1 to 2 weeks.  Home care  Follow these guidelines when caring for yourself at home:   Clean and soak the toe or finger. Do this 2 times a day for the first 3 days. To do so:   Soak your foot or hand in a tub of warm water for 5 minutes. Or hold your toe or finger under a faucet of warm running water for 5 minutes.   Clean any crust away with soap and water using a cotton swab.   Put antibiotic ointment on the infected area.   Change the dressing daily or any time it gets dirty.   If you were given antibiotics, take them as directed until they are all gone.   If your infection is on a toe, wear comfortable shoes with a lot of toe room. You can also wear open-toed sandals while your toe heals.   You may use over-the-counter medicine (acetaminophen or ibuprofen to help with pain, unless another medicine was prescribed. If you have chronic liver or kidney disease, talk with your healthcare provider before using these medicines. Also  talk with your provider if you've had a stomach ulcer or GI (gastrointestinal) bleeding.  Prevention  The following can prevent paronychia:   Avoid cutting or playing with your cuticles at home.   Don't bite your nails.   Don't suck on your thumbs or fingers.  Follow-up care  Follow up with your healthcare provider, or as advised.  When to seek medical advice  Call your healthcare provider right away if any of these occur:   Redness, pain, or swelling of the finger or toe gets worse   Red streaks in the skin leading away from the wound   Pus or fluid draining from the nail area   Fever of 100.49F (38C) or higher, or as directed by your provider  Date Last Reviewed: 09/21/2014   2000-2017 [employer]. [address] [place] [address]. All rights reserved. This information is not intended as a substitute for professional medical care. Always follow your healthcare professional's instructions.

## 2016-12-14 NOTE — Progress Notes (Signed)
Pt roomed, medications, and allergies verified and updated by August Luz, MA-C  August Luz, 12/14/2016 11:28 AM

## 2016-12-19 ENCOUNTER — Encounter (INDEPENDENT_AMBULATORY_CARE_PROVIDER_SITE_OTHER): Payer: Self-pay | Admitting: Family Medicine

## 2016-12-19 ENCOUNTER — Ambulatory Visit (INDEPENDENT_AMBULATORY_CARE_PROVIDER_SITE_OTHER): Payer: Medicare Other | Admitting: Family Medicine

## 2016-12-19 VITALS — BP 104/50 | HR 66 | Temp 98.3°F | Resp 19 | Wt 116.0 lb

## 2016-12-19 DIAGNOSIS — Z681 Body mass index (BMI) 19 or less, adult: Secondary | ICD-10-CM

## 2016-12-19 DIAGNOSIS — B354 Tinea corporis: Secondary | ICD-10-CM

## 2016-12-19 DIAGNOSIS — L03031 Cellulitis of right toe: Secondary | ICD-10-CM

## 2016-12-19 DIAGNOSIS — T148XXA Other injury of unspecified body region, initial encounter: Secondary | ICD-10-CM

## 2016-12-19 LAB — CBC, DIFF
% Basophils: 1 %
% Eosinophils: 7 %
% Immature Granulocytes: 0 %
% Lymphocytes: 26 %
% Monocytes: 12 %
% Neutrophils: 54 %
% Nucleated RBC: 0 %
Absolute Eosinophil Count: 0.48 10*3/uL (ref 0.00–0.50)
Absolute Lymphocyte Count: 1.81 10*3/uL (ref 1.00–4.80)
Basophils: 0.06 10*3/uL (ref 0.00–0.20)
Hematocrit: 39 % (ref 36–45)
Hemoglobin: 13.2 g/dL (ref 11.5–15.5)
Immature Granulocytes: 0.01 10*3/uL (ref 0.00–0.05)
MCH: 33.2 pg (ref 27.3–33.6)
MCHC: 33.8 g/dL (ref 32.2–36.5)
MCV: 98 fL (ref 81–98)
Monocytes: 0.83 10*3/uL — ABNORMAL HIGH (ref 0.00–0.80)
Neutrophils: 3.7 10*3/uL (ref 1.80–7.00)
Nucleated RBC: 0 10*3/uL
Platelet Count: 191 10*3/uL (ref 150–400)
RBC: 3.97 10*6/uL (ref 3.80–5.00)
RDW-CV: 12.6 % (ref 11.6–14.4)
WBC: 6.89 10*3/uL (ref 4.3–10.0)

## 2016-12-19 LAB — COMPREHENSIVE METABOLIC PANEL
ALT (GPT): 18 U/L (ref 7–33)
AST (GOT): 26 U/L (ref 9–38)
Albumin: 4.3 g/dL (ref 3.5–5.2)
Alkaline Phosphatase (Total): 50 U/L (ref 38–172)
Anion Gap: 11 (ref 4–12)
Bilirubin (Total): 0.5 mg/dL (ref 0.2–1.3)
Calcium: 10 mg/dL (ref 8.9–10.2)
Carbon Dioxide, Total: 25 meq/L (ref 22–32)
Chloride: 102 meq/L (ref 98–108)
Creatinine: 0.99 mg/dL (ref 0.38–1.02)
GFR, Calc, African American: >60 mL/min/{1.73_m2} (ref >59)
GFR, Calc, European American: 56 mL/min/{1.73_m2} — ABNORMAL LOW (ref >59)
Glucose: 90 mg/dL (ref 62–125)
Potassium: 5.3 meq/L — ABNORMAL HIGH (ref 3.6–5.2)
Protein (Total): 6.9 g/dL (ref 6.0–8.2)
Sodium: 138 meq/L (ref 135–145)
Urea Nitrogen: 22 mg/dL — ABNORMAL HIGH (ref 8–21)

## 2016-12-19 LAB — PROTHROMBIN & PTT
Partial Thromboplastin Time: 24 s (ref 22–35)
Prothrombin INR: 0.9 (ref 0.8–1.3)
Prothrombin Time Patient: 12.2 s (ref 10.7–15.6)

## 2016-12-19 MED ORDER — KETOCONAZOLE 2 % EX CREA
1.0000 | TOPICAL_CREAM | Freq: Two times a day (BID) | CUTANEOUS | 0 refills | Status: DC
Start: 2016-12-19 — End: 2017-03-29

## 2016-12-19 NOTE — Progress Notes (Signed)
Pt roomed, medications, and allergies verified and updated by [name], [name]     Reason for visit: Follow up  (Pt seen in [facility] 12/14/2016, cellulitis 2nd toe right foot and bruising/discoloration of hands.Pt reports blood vessels burst easily under fingers causing bruising )      Cervical screening/PAP:n/a  Mammo: Up to date   Colon Screen: Up to date   Have you seen a specialist since your last visit:      If  Name and location and date.         Health Maintenance   Topic Date Due   . Zoster Vaccine (2 of 3) 07/08/2012   . Depression Screening (PHQ-2)  09/08/2016   . Influenza Vaccine (1) 11/20/2016   . Lipid Disorders Screening  02/03/2018   . Breast Cancer Screening  04/26/2018   . Tetanus Vaccine  11/28/2023   . Colorectal Cancer Screening (Colonoscopy)  09/14/2024   . Osteoporosis Screening  Completed   . Pneumococcal Vaccine  Completed   . Hepatitis C Screening  Addressed                 Future Appointments  Date Time Provider [place]   12/19/2016 10:40 AM [place], Resa Miner, MD [facility]

## 2016-12-19 NOTE — Progress Notes (Signed)
Paula Bowman is a 66 year old female who presents today at 11:17 AM for bruising.    Pt has been here twice in the past 1.5 weeks because of an infection in her toe.    She's getting a weird bruising on the underside of her fingers.  If she picks something up or pushes it wrong, she breaks a blood vessel.   It hurts for 2-3 days.  Started with thumb of left hand then pointer of left hand then ring finger of right hand.  This is over the past 2 weeks.  It has come and gone over the past year.    Pt bruises easily and always has.    Pt has infection on her right 2nd toe.  She came in and got a topical treatment that didn't help.  She's been on an oral medication for 4 days, not certain if helping.    She has a weird thing like an infection going on with her ear.  It's itching and tender.  Caladril takes away the itching.  It's been over a month.    Patient Active Problem List   Diagnosis   . Anxiety state   . Other chronic allergic conjunctivitis   . GERD (gastroesophageal reflux disease)   . Eczema   . Allergic rhinitis   . Onychomycosis   . Back pain   . 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 Neck pain   . Need for prophylactic vaccination with combined diphtheria-tetanus-pertussis (DTP) vaccine   . Radiculopathy of cervical region   . Heat exhaustion   . Dry eye     Outpatient Prescriptions Marked as Taking for the 12/19/16 encounter (Office Visit) with Lynsee Wands, Resa Miner, MD   Medication Sig Dispense Refill   . ALPRAZolam (XANAX OR)      . CycloSPORINE 0.05 % Ophthalmic Emulsion Place 1 drop in each EYE every 12 hours.     . Fluticasone Propionate 50 MCG/ACT Nasal Suspension Spray 2 sprays into each nostril daily. For prevention/control of nasal/sinus congestion 3 Inhaler 3   . Fluticasone Propionate HFA 220 MCG/ACT Inhalation Aerosol Inhale 1 puff by mouth 2 times a day. 1 Inhaler 5   . Olopatadine HCl 0.1 % Ophthalmic Solution Place 1 drop in each EYE 2 times a day. as  needed (the azelastine is not tolerated)     . Omeprazole 20 MG Oral CAPSULE DELAYED RELEASE Take 1 capsule (20 mg) by mouth daily on an empty stomach. 90 capsule 3   . Sulfamethoxazole-Trimethoprim (BACTRIM DS) 800-160 MG Oral Tab Take 1 tablet by mouth 2 times a day for 10 days. Take until gone. 20 tablet 0     BP 104/50   Pulse 66   Temp 98.3 F (36.8 C) (Temporal)   Resp 19   Wt 116 lb (52.6 kg)   SpO2 97%   BMI 18.17 kg/m   PE: Dusty Paula Bowman is a 66 year old female  in NAD  Heart: normal rate, regular rhythm and no murmurs, clicks, or gallops  Lungs: Clear lung fields without crackles, wheezing or expiratory prolongation  NECK:  Inspection:  normal alignment; no masses, Thyroid:  No enlargement, masses or tenderness., Cervial nodes:  no adenopathy.  Bruits: None bilaterally  Extremities: No edema.  Right ear: Mild erythema and scaling around the earring hole.  Right foot: 2nd toe has erythema at the proximal nail fold.  Right 4th finger: Significant bruising appreciated.  A/P:  (T14.8XXA) Bruising  (primary encounter diagnosis)  Plan: CBC, DIFF, COMPREHENSIVE METABOLIC PANEL,         PROTHROMBIN & PTT        Will email with results.     (B35.4) Tinea corporis  Plan: Ketoconazole 2 % External Cream        Return if not better or worse.     (L03.031) Paronychia of second toe of right foot  Plan: Return if not better or worse.

## 2016-12-20 ENCOUNTER — Encounter (INDEPENDENT_AMBULATORY_CARE_PROVIDER_SITE_OTHER): Payer: Self-pay | Admitting: Family Medicine

## 2016-12-20 ENCOUNTER — Telehealth (INDEPENDENT_AMBULATORY_CARE_PROVIDER_SITE_OTHER): Payer: Self-pay | Admitting: Family Medicine

## 2016-12-20 NOTE — Telephone Encounter (Signed)
Please let pt know that her potassium is a little bit high.  If she's taking a potassium supplement she should stop.  Otherwise, she should reduce the foods she's eating that are high in potassium.    No concerning cause for her bruising was found.    Charmaine Downs, MD  [facility]

## 2016-12-20 NOTE — Telephone Encounter (Signed)
I called and LVM relaying Dr. Derryl Harbor note.      CCR: if patient calls back please relay Dr. Derryl Harbor previous note.

## 2016-12-21 ENCOUNTER — Other Ambulatory Visit: Payer: Self-pay

## 2016-12-26 ENCOUNTER — Encounter (INDEPENDENT_AMBULATORY_CARE_PROVIDER_SITE_OTHER): Payer: Medicare Other | Admitting: Family Medicine

## 2017-01-05 ENCOUNTER — Telehealth: Payer: Self-pay | Admitting: Internal Medicine

## 2017-01-05 MED ORDER — ALPRAZOLAM 1 MG PO TABS: 0.5000 mg | ORAL_TABLET | Freq: Two times a day (BID) | ORAL | 3 refills | Status: DC | PRN

## 2017-01-05 NOTE — Telephone Encounter (Signed)
Check [place] registry last filled 11/06/2016...Johny Chess

## 2017-01-05 NOTE — Telephone Encounter (Signed)
Pt called and would like a refill of her  ALPRAZolam (XANAX) 1 MG tablet  Please advise

## 2017-01-05 NOTE — Telephone Encounter (Signed)
Emailed Rx

## 2017-01-22 DIAGNOSIS — N762 Acute vulvitis: Secondary | ICD-10-CM | POA: Diagnosis not present

## 2017-01-22 DIAGNOSIS — L904 Acrodermatitis chronica atrophicans: Secondary | ICD-10-CM | POA: Diagnosis not present

## 2017-01-22 DIAGNOSIS — B372 Candidiasis of skin and nail: Secondary | ICD-10-CM | POA: Diagnosis not present

## 2017-01-29 ENCOUNTER — Ambulatory Visit (INDEPENDENT_AMBULATORY_CARE_PROVIDER_SITE_OTHER): Payer: Medicare Other | Admitting: Physician Assistant

## 2017-01-29 ENCOUNTER — Encounter (INDEPENDENT_AMBULATORY_CARE_PROVIDER_SITE_OTHER): Payer: Self-pay | Admitting: Physician Assistant

## 2017-01-29 VITALS — BP 124/80 | HR 65 | Temp 98.2°F | Resp 16 | Ht 67.01 in | Wt 116.0 lb

## 2017-01-29 DIAGNOSIS — L309 Dermatitis, unspecified: Secondary | ICD-10-CM

## 2017-01-29 DIAGNOSIS — N8110 Cystocele, unspecified: Secondary | ICD-10-CM

## 2017-01-29 DIAGNOSIS — Z681 Body mass index (BMI) 19 or less, adult: Secondary | ICD-10-CM

## 2017-01-29 MED ORDER — CLOBETASOL PROPIONATE 0.05 % EX SOLN
Freq: Two times a day (BID) | CUTANEOUS | 0 refills | Status: DC
Start: 2017-01-29 — End: 2017-03-29

## 2017-01-29 NOTE — Progress Notes (Signed)
Chief Complaint   Patient presents with   . Derm Problem     c/o itchy rash on RT ear >1 month; pt saw Dr. Derryl Harbor on 10/30 and was Rx'd Ketoconazole 2 % External Cream for the ear, but pt is concerned that the rash is worsening.   Paula Bowman Concern     c/o red "angry" lump on the vaginal area that pt noticed this morning. States that she felt sore yesterday, but didn't see the area until this morning.       SUBJECTIVE:  Paula Bowman is an 66 year old female who presents with vaginal discomfort and a lump in the vaginal since yesterday. She played bridge yesterday and noticed the cushion on the chair was very bothersome. She looked this morning and she noticed a "red knot" about the size of 2 peas in the area, in the center of the opening of the vagina. She denies whiteness/pustule or blistering over the area. She denies hx of herpes in the past. No fever since onset. Denies new soaps, lotions. Everything is the same. Reports some increase in urinary frequency without burning.    Patient is also here with concerns for rash on her right ear. She was previously seen for this, about a month ago. Rx'd ketoconazole and OTC hydrocortisone without relief. Itching is very bad and spreading.            Outpatient Medications Prior to Visit   Medication Sig Dispense Refill   . ALPRAZolam (XANAX OR)      . Clindamycin Phosphate 1 % External Solution Apply three times daily and allow it to dry. (Patient not taking: Reported on 12/19/2016) 30 mL 0   . CycloSPORINE 0.05 % Ophthalmic Emulsion Place 1 drop in each EYE every 12 hours.     . Fluticasone Propionate 50 MCG/ACT Nasal Suspension Spray 2 sprays into each nostril daily. For prevention/control of nasal/sinus congestion 3 Inhaler 3   . Fluticasone Propionate HFA 220 MCG/ACT Inhalation Aerosol Inhale 1 puff by mouth 2 times a day. 1 Inhaler 5   . Ketoconazole 2 % External Cream Apply 1 application to affected area on ear(s) 2 times a day. 15 g 0   . Olopatadine HCl  0.1 % Ophthalmic Solution Place 1 drop in each EYE 2 times a day. as needed (the azelastine is not tolerated)     . Omeprazole 20 MG Oral CAPSULE DELAYED RELEASE Take 1 capsule (20 mg) by mouth daily on an empty stomach. 90 capsule 3     No facility-administered medications prior to visit.        Review of patient's allergies indicates:  Allergies   Allergen Reactions   . Amoxicillin Rash   . Levofloxacin Itching     Redness up arm from IV dose   . Penicillin G Rash   . Terbinafine Hcl      Loss of sense of taste       Social History   Substance Use Topics   . Smoking status: Former Smoker     Packs/day: 2.00     Years: 15.00     Types: Cigarettes     Quit date: 01/07/1988   . Smokeless tobacco: Never Used   . Alcohol use 0.5 oz/week     1 Standard drinks or equivalent per week      Comment: 2 glasses of wine daily       Review of Systems   Constitutional: Negative for chills, diaphoresis, fatigue and  fever.   Gastrointestinal: Negative for abdominal pain, nausea and vomiting.   Genitourinary: Positive for frequency and vaginal pain (lump in vaginal region). Negative for difficulty urinating, dysuria, flank pain, hematuria, vaginal bleeding and vaginal discharge.   Skin: Positive for rash (right ear lobe).       OBJECTIVE:  BP 124/80   Pulse 65   Temp 98.2 F (36.8 C) (Temporal)   Resp 16   Ht 5' 7.01" (1.702 m)   Wt 116 lb (52.6 kg)   SpO2 100%   BMI 18.16 kg/m   Physical Exam   Constitutional: She appears well-developed and well-nourished. No distress.   HENT:   Right Ear: Hearing, tympanic membrane and ear canal normal. No drainage, swelling or tenderness.   Left Ear: Hearing, tympanic membrane and ear canal normal.   Ears:    Cardiovascular: Normal rate, regular rhythm and normal heart sounds.    No murmur heard.  Pulmonary/Chest: Effort normal and breath sounds normal. No respiratory distress. She has no wheezes. She has no rales.   Genitourinary:         Genitourinary Comments: Bulging, cherry-red  soft tissue structure, rounded. Worsens with bearing down.  Patient declines speculum exam, allows for single digit exam only  Protrusion/prolapse is reducible from the anterior surface of the vagina.   Neurological: She is alert.   Skin: Skin is warm and dry. She is not diaphoretic.   Psychiatric: She has a normal mood and affect.   Nursing note and vitals reviewed.      ASSESSMENT/PLAN  (N81.10) Female cystocele  (primary encounter diagnosis)  Plan: REFERRAL TO OB/GYN, CANCELED: REFERRAL TO         UROLOGY    (L30.9) Dermatitis of external ear  Plan: Clobetasol Propionate 0.05 % External Solution    Suspected cystocele vs urethral prolapse. Exam is difficult, and limited, due to significant dryness and discomfort  Referral to gynecology  Advised to resume use of stool softeners to minimize time spent on the toilet, good hydration and at least 30g fiber per day    DERMATITIS:  Will trial clobetasol solution to the earlobe twice daily until rash is improved  Follow up with PCP as needed.    Discussed medication in detail including dosing, side effects, and interactions.    Irene Pap, PA-C  [facility]

## 2017-01-29 NOTE — Progress Notes (Signed)
Vitals, medication(s), and allergies verified by Solange Emry, MA-C

## 2017-01-29 NOTE — Patient Instructions (Addendum)
It was a pleasure to see you in clinic today. Your Medical Assistant was: Marjo Bicker can schedule an appointment to see [place] by calling [phone] or via eCare.     If labs were ordered today the results are expected to be available via eCare 5 days later. Otherwise, result letters are mailed 7-10 days after your tests are completed. If your physician needs to change your care based on your results, you will receive a phone call to notify you. If you haven't heard from him/her and it has been more than 10 days please give [place] a call.     Thank you for choosing [facility].         Patient Education     Pelvic Organ Prolapse  Pelvic organ prolapse is when1 or more organs inside the pelvis slip from their normal places. The pelvis is found between the waist and thighs. Normally, muscles and tissues in the pelvic region support the pelvic organs and hold them in place.  What is a normal pelvis?     Cutaway view of pelvis showing the small intestine, bladder, pubic bone, urethra, pelvic floor muscles, uterus, vagina, and rectum.   A. The small intestine absorbs nutrients from food.  B. The bladder collects and holds urine.  C. The pubic bone helps protect the pelvic organs.  D. The urethra is the tube that carries urine out of the body.  E. The pelvic floor muscles support organs and other structures in the pelvis.  F. The uterus is where the baby develops when a women is pregnant.  G. The vagina is the canal from the uterus to the outside of the body.  H. The rectum stores stool until a bowel movement occurs.  What causes pelvic organ prolapse?  There are severalcauses of pelvic organ prolapse including:   Vaginal childbirth   Hereditary (genetic) factors   Connective tissue disorders   Getting older   Constant coughing (such as with bronchitis or smoking)   Heavy lifting   Chronic straining (such as with constipation)   Being overweight  What are the symptoms of pelvic  organ prolapse?  The symptoms of pelvic organ prolapse include:   A feeling of fullness or pressure in your pelvis   A sense that a ball or lump is sticking out from the vagina   Problems passing urine or having a bowel movement   Urine leakage when you cough or use stairs. (But this can happen even without prolapse.)   Pain or pressure in your low back   Pain when having sex  Date Last Reviewed: 07/22/2015   2000-2017 [employer]. [address] [place] [address]. All rights reserved. This information is not intended as a substitute for professional medical care. Always follow your healthcare professional's instructions.

## 2017-01-30 ENCOUNTER — Telehealth (INDEPENDENT_AMBULATORY_CARE_PROVIDER_SITE_OTHER): Payer: Self-pay | Admitting: Internal Medicine

## 2017-01-30 NOTE — Telephone Encounter (Signed)
(  TEXTING IS AN OPTION FOR [facility] CLINICS ONLY)  Is this a [facility] clinic? Yes. What is the mobile number we can use to get a hold of you via text? [phone]      RETURN CALL: Detailed message on voicemail only      SUBJECT:  General Message     REASON FOR REQUEST: Patient advised referral for  [facility] to see Dr.Poehlmann has not been received by clinic yet. She advised they received confirmation of fax but did not receive fax. She is requesting clinic to refax referral a couple times asap. She said it's an urgent referral.      MESSAGE: Please call

## 2017-01-30 NOTE — Telephone Encounter (Signed)
Referral faxed on 01/30/2017    Referral re-faxed to     [facility]    [address]    [place]  [address]    phone: [phone]    fax:   [phone]    Referral priority is listed as Routine    Your fax has been successfully sent to [facility] at [phone].    ------------------------------------------------------------  From: [facility] Referral Team  ------------------------------------------------------------    01/30/2017 5:42:25 PM Transmission Record   Sent to [phone] with remote ID "[id number]"   Result: (0/339;0/0) Successful Send   Page record: 1 - 15   Elapsed time: 11:09 on channel 17      Pt advised via Text:        Closing TE

## 2017-02-06 DIAGNOSIS — Z23 Encounter for immunization: Secondary | ICD-10-CM | POA: Diagnosis not present

## 2017-02-06 DIAGNOSIS — L304 Erythema intertrigo: Secondary | ICD-10-CM | POA: Diagnosis not present

## 2017-03-20 ENCOUNTER — Encounter (INDEPENDENT_AMBULATORY_CARE_PROVIDER_SITE_OTHER): Payer: Self-pay | Admitting: Family Medicine

## 2017-03-20 ENCOUNTER — Ambulatory Visit (INDEPENDENT_AMBULATORY_CARE_PROVIDER_SITE_OTHER): Payer: Medicare Other | Admitting: Family Medicine

## 2017-03-20 VITALS — BP 125/71 | HR 68 | Temp 98.0°F | Resp 14 | Wt 116.0 lb

## 2017-03-20 DIAGNOSIS — J019 Acute sinusitis, unspecified: Secondary | ICD-10-CM

## 2017-03-20 DIAGNOSIS — Z681 Body mass index (BMI) 19 or less, adult: Secondary | ICD-10-CM

## 2017-03-20 MED ORDER — AZITHROMYCIN 250 MG OR TABS
ORAL_TABLET | ORAL | 0 refills | Status: DC
Start: 2017-03-20 — End: 2017-03-29

## 2017-03-20 NOTE — Progress Notes (Signed)
Medications, allergies, vitals verified by Chidubem Chaires/CMA

## 2017-03-20 NOTE — Patient Instructions (Signed)
Thank you for choosing the Urgent Care at [facility] for your visit today.  Your Provider today was Dr Carloyn Manner  If you receive a survey in the mail please complete this as it helps [place] improve our care to you.    If you have lab tests that have not been completed at the time of your discharge, you will be contacted when they're completed.  This may be done either via eCare or via the phone.  If you had x-rays today you were given a preliminary diagnosis from the provider.  If something of significance difference is seen by the radiologist you will be contacted and those differences will be described or explained.  If you sign up with eCare you will be able to view the x-ray reports and the lab work within a few days of their completion.    If you have questions about your care you can call the [facility] clinic during office hours at [phone], or contact the [facility] 24 hour number at [phone].      Please follow-up with your primary care provider in 3-5 days or sooner if you are getting worse. If you are in need of a primary care provider and would like to establish care with [facility], please call [phone].    Your specific instructions are listed below:  Acute Sinusitis Care Instructions     Acute Sinusitis means symptoms of a sinus inflammation (pain, congestion, drainage) lasting up to 4-6 weeks. This congestion can be started by dry air, viruses, allergies, and other airborne irritants.  Studies have shown that patients with this problem benefited most  from sinus drainage and decompression and did not benefit from antibiotics. Antibiotics have only been shown to be beneficial in people who have had symptoms of sinus congestion for longer than 4-6 weeks and in those with sinus congestion lasting more than 10 days that then develop a fever. Patients who receive antibiotics are at risk of developing side effects, such as allergic reactions to the medications,  diarrhea, yeast and other secondary infections and of developing antibiotic resistant bacteria.  Antibiotic allergies prevent the usage of this family of antibiotics in the future, when you may really need them.     In order to relieve the discomfort and prevent a serious infection from developing, we need to get the sinuses open to allow drainage and relief of pressure. Here is a list of things that will help you do this.     1) Add Humidity which helps shrink the nasal membranes and helps open the sinus openings. It also helps thin out secretions. This can be accomplished in many ways.       a) Breathing in steam off of a kettle, vaporizer, humidifier or a facial steamer.       b) Using mask (surgeons or painters) helps trap moisture from your breath in the membrane and this moisture helps humidify the air when you inhale.      c) Nasal saline sprays will add moisture and also washes the membranes off. It also help thin the mucous making it easier for your sinuses to drain. You should use this about 6 times a day for allergies.  Nasal irrigations with a Elder Love Pot have been shown to help if administered correctly.  Many patients state they prefer "Simply Saline" which is a compressed canister of saline that delivers a very fine mist.  You can spray or irrigate with this depending on  how long you spray for.      2) You can shrink the nasal membranes by elevating the head. Add an extra pillow at night which helps decrease pressure in the facial tissues and lessen nasal congestion.     3) Avoid antihistamines that can dry or thicken up secretions making it more difficult for them to drain through the very small sinus openings. This includes Theraflu, Nyquil, Benadryl and any OTC nighttime sleep aides.  Allegra, Zyrtec and Claritin do not work in the same way and should not be a problem.    4) Avoid flying, which can cause significant pain due to pressure.     5) Be aware of things that dry out the air such as:        a. Wood stoves, and other heating sources       b. Air conditioners       c. Airplane air (wear a mask)       d. Cold air in the winter is also very drying     6) Medications:       a) Mucinex (Guafenesin), which is over the counter, does thin the mucous and this allows better drainage and helps decrease pressure. It should be taken 2-3 times daily for a total dose of 1200mg  (2 of the 600mg  or 3 of the 400mg  tablets) and preferably the plain Mucinex without decongestants or cough suppressants. It also thins out bronchial secretions and helps you bring up any phlegm easier.       b) Increasing fluids, especially hot liquids, decreases the thickness of the mucous and again improves drainage of the sinuses.  Do this with the Mucinex.      c) The use of Tylenol or Advil will also help relieve pain and pressure.       d) Oral decongestants may have some benefit, but may also raise Blood Pressure, add anxiety, increase heart rate and risk of arrhythmias, and make it more difficult to sleep and in older men can sometimes cause urinary difficulties to the point of needing a catheter.       e) Nasal Decongestant sprays can be very effective if used properly. Here are a few points to improve effectiveness and decrease problems.           1. Use a 12 hour Afrin Pump Sprayer that provides a better measured amount of spray (and a finer mist also).           2. Gently blow your nose first, or use steam to shrink membranes. This will allow the spray to travel further and coat better.           3. Keep the spray out of your throat as it will feel raw and irritated if it gets there.  To avoid this problem spray one nostril at a time (one or two sprays) and gently sniff the spray up until you feel it at the back of your throat (you may sense a tingle, burn or taste). Gently blow it back out so as to minimize the amount in your throat. After you blow it back out, repeat the same process on the other nostril.           4. Do not use  more than twice a day, and never more than 4-5 days or your nose will get used to the medicine and remain stuffy without it.  I recommend once daily for 3 days.  5. Wait 15-20 minutes before using saline sprays, but continue to use the saline.   If fever develops or headache persists despite the above treatments, follow up with your doctor for re-evaluation. There are other causes of sinus pain such as dental causes, migraines, allergies and other more serious problems.           6.  Corticosteroid nasal sprays (Flonase or Nasalcort) are useful at shrinking down the nasal tissues and may be prescribed.  These are most effective with allergies but also work on the swollen nasal tissues from other causes.    Use the antibiotics as directed

## 2017-03-20 NOTE — Progress Notes (Signed)
Paula Bowman is a 67 year old female who presents to the [facility] with a URI (Patient has a cough, sinus pressure with discharge.)  67 year old female who is here to get antibiotics for a sinus infection.  She's had many sinus infections over the years and is found that when the mucus in her sinuses turns green she needs to get on antibiotics right away or she'll end up in the hospital.  She has allergies to amoxicillin and Levaquin and usually uses a 5 day Z-Pak.  She currently has not had any fevers or chills but has had fatigue.  She uses Flonase nearly every day for chronic sinus issues and allergies.  The last few nights she's been using NyQuil.  She noticed a congestion is not improving.  She is not aware of any fever.  She has not had any significant cough.  She does not have a family history of sinus congestion.  She states she did not inherit that from her parents but has had it on her own.  She developed allergies when she was 67 years of age.  She is to work as a Catering manager.      Past Medical History:   Diagnosis Date   . Esophageal reflux    . 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    . Microscopic colitis        No outpatient prescriptions have been marked as taking for the 03/20/17 encounter (Office Visit) with Quincy Sheehan, MD.       Review of patient's allergies indicates:  Allergies   Allergen Reactions   . Amoxicillin Rash   . Levofloxacin Itching     Redness up arm from IV dose   . Penicillin G Rash   . Terbinafine Hcl      Loss of sense of taste       Past Surgical History:   Procedure Laterality Date   . BREAST LUMPECTOMY     . COLONOSCOPY STOMA DX INCLUDING COLLJ [place] SPX  2006    [place] , FU 10 years   . HERNIA REPAIR     . Knee, R knee at age 29, removed floating bone     . TOTAL ABDOMINAL HYSTERECT W/WO  RMVL TUBE OVARY      for fibroids   . UNLISTED PROCEDURE FEMUR/KNEE Right 1969   . VAGINAL HYSTERECTOMY UTERUS 250 GM/<  1994    Ovaries in place       Social History   Substance Use Topics   . Smoking status: Former Smoker     Packs/day: 2.00     Years: 15.00     Types: Cigarettes     Quit date: 01/07/1988   . Smokeless tobacco: Never Used   . Alcohol use 0.5 oz/week     1 Standard drinks or equivalent per week      Comment: 2 glasses of wine daily       Parts of this medical record are completed using a dragon dictation system.    Review of Systems   Constitutional: Positive for activity change and fatigue. Negative for chills.   HENT: Positive for sinus pain and sinus pressure. Negative for hearing loss and sore throat.    Respiratory: Negative for cough and shortness of breath.    Gastrointestinal: Negative for nausea.  Allergic/Immunologic: Positive for environmental allergies.   Psychiatric/Behavioral: Positive for sleep disturbance.       BP 125/71   Pulse 68   Temp 98 F (36.7 C) (Oral)   Resp 14   Wt 116 lb (52.6 kg)   SpO2 100%   BMI 18.16 kg/m   Physical Exam   Constitutional: She is oriented to person, place, and time. She appears well-developed and well-nourished. No distress.   HENT:   Head: Normocephalic and atraumatic.   Pharynx is nonerythematous.  Do not appreciate cobblestoning of the posterior pharynx.  TMs appear normal.  Nasal turbinates are mildly swollen but the nasal passages are notably narrow with minimal air passage remaining.  There is mild palpable tenderness to the maxillary and frontal sinuses.  There is no adenopathy of the neck.   Neck: Neck supple.   Pulmonary/Chest: Effort normal and breath sounds normal. No respiratory distress. She has no wheezes.   Lymphadenopathy:     She has no cervical adenopathy.   Neurological: She is alert and oriented to person, place, and time.   Skin: Skin is warm and dry.   Psychiatric: She has a normal mood and affect. Her behavior is  normal. Judgment normal.   Nursing note and vitals reviewed.    (J01.90) Subacute sinusitis, unspecified location  (primary encounter diagnosis)  Plan: Azithromycin (ZITHROMAX) 250 MG Oral Tab        With the patient's chronic allergy she probably has chronic nasal congestion and probably does fit the subacute sinusitis definition of 4-6 weeks of congestion.  I've given her prescription for Zithromax for a 7 day course which I believe is indicated and sinus infections.  She will try the following instructions below and start the antibiotic.  I advised her not to use the Afrin nasal spray unless she plans on flying.    Patient Instructions   Thank you for choosing the Urgent Care at [facility] for your visit today.  Your Provider today was Dr Carloyn Manner  If you receive a survey in the mail please complete this as it helps [place] improve our care to you.    If you have lab tests that have not been completed at the time of your discharge, you will be contacted when they're completed.  This may be done either via eCare or via the phone.  If you had x-rays today you were given a preliminary diagnosis from the provider.  If something of significance difference is seen by the radiologist you will be contacted and those differences will be described or explained.  If you sign up with eCare you will be able to view the x-ray reports and the lab work within a few days of their completion.    If you have questions about your care you can call the [facility] clinic during office hours at [phone], or contact the [facility] [place] 24 hour number at [phone].      Please follow-up with your primary care provider in 3-5 days or sooner if you are getting worse. If you are in need of a primary care provider and would like to establish care with [facility], please call [phone].    Your specific instructions are listed below:  Acute Sinusitis Care Instructions     Acute Sinusitis means symptoms of  a sinus inflammation (pain, congestion, drainage) lasting up to 4-6 weeks. This congestion can be started by dry air, viruses, allergies, and other airborne irritants.  Studies have  shown that patients with this problem benefited most  from sinus drainage and decompression and did not benefit from antibiotics. Antibiotics have only been shown to be beneficial in people who have had symptoms of sinus congestion for longer than 4-6 weeks and in those with sinus congestion lasting more than 10 days that then develop a fever. Patients who receive antibiotics are at risk of developing side effects, such as allergic reactions to the medications, diarrhea, yeast and other secondary infections and of developing antibiotic resistant bacteria.  Antibiotic allergies prevent the usage of this family of antibiotics in the future, when you may really need them.     In order to relieve the discomfort and prevent a serious infection from developing, we need to get the sinuses open to allow drainage and relief of pressure. Here is a list of things that will help you do this.     1) Add Humidity which helps shrink the nasal membranes and helps open the sinus openings. It also helps thin out secretions. This can be accomplished in many ways.       a) Breathing in steam off of a kettle, vaporizer, humidifier or a facial steamer.       b) Using mask (surgeons or painters) helps trap moisture from your breath in the membrane and this moisture helps humidify the air when you inhale.      c) Nasal saline sprays will add moisture and also washes the membranes off. It also help thin the mucous making it easier for your sinuses to drain. You should use this about 6 times a day for allergies.  Nasal irrigations with a Elder Love Pot have been shown to help if administered correctly.  Many patients state they prefer "Simply Saline" which is a compressed canister of saline that delivers a very fine mist.  You can spray or irrigate with this  depending on how long you spray for.      2) You can shrink the nasal membranes by elevating the head. Add an extra pillow at night which helps decrease pressure in the facial tissues and lessen nasal congestion.     3) Avoid antihistamines that can dry or thicken up secretions making it more difficult for them to drain through the very small sinus openings. This includes Theraflu, Nyquil, Benadryl and any OTC nighttime sleep aides.  Allegra, Zyrtec and Claritin do not work in the same way and should not be a problem.    4) Avoid flying, which can cause significant pain due to pressure.     5) Be aware of things that dry out the air such as:       a. Wood stoves, and other heating sources       b. Air conditioners       c. Airplane air (wear a mask)       d. Cold air in the winter is also very drying     6) Medications:       a) Mucinex (Guafenesin), which is over the counter, does thin the mucous and this allows better drainage and helps decrease pressure. It should be taken 2-3 times daily for a total dose of 1200mg  (2 of the 600mg  or 3 of the 400mg  tablets) and preferably the plain Mucinex without decongestants or cough suppressants. It also thins out bronchial secretions and helps you bring up any phlegm easier.       b) Increasing fluids, especially hot liquids, decreases the thickness of the mucous and again  improves drainage of the sinuses.  Do this with the Mucinex.      c) The use of Tylenol or Advil will also help relieve pain and pressure.       d) Oral decongestants may have some benefit, but may also raise Blood Pressure, add anxiety, increase heart rate and risk of arrhythmias, and make it more difficult to sleep and in older men can sometimes cause urinary difficulties to the point of needing a catheter.       e) Nasal Decongestant sprays can be very effective if used properly. Here are a few points to improve effectiveness and decrease problems.           1. Use a 12 hour Afrin Pump Sprayer that  provides a better measured amount of spray (and a finer mist also).           2. Gently blow your nose first, or use steam to shrink membranes. This will allow the spray to travel further and coat better.           3. Keep the spray out of your throat as it will feel raw and irritated if it gets there.  To avoid this problem spray one nostril at a time (one or two sprays) and gently sniff the spray up until you feel it at the back of your throat (you may sense a tingle, burn or taste). Gently blow it back out so as to minimize the amount in your throat. After you blow it back out, repeat the same process on the other nostril.           4. Do not use more than twice a day, and never more than 4-5 days or your nose will get used to the medicine and remain stuffy without it.  I recommend once daily for 3 days.           5. Wait 15-20 minutes before using saline sprays, but continue to use the saline.   If fever develops or headache persists despite the above treatments, follow up with your doctor for re-evaluation. There are other causes of sinus pain such as dental causes, migraines, allergies and other more serious problems.           6.  Corticosteroid nasal sprays (Flonase or Nasalcort) are useful at shrinking down the nasal tissues and may be prescribed.  These are most effective with allergies but also work on the swollen nasal tissues from other causes.    Use the antibiotics as directed

## 2017-03-26 ENCOUNTER — Encounter (HOSPITAL_BASED_OUTPATIENT_CLINIC_OR_DEPARTMENT_OTHER): Payer: Self-pay | Admitting: Diagnostic Radiology

## 2017-03-28 ENCOUNTER — Encounter (HOSPITAL_BASED_OUTPATIENT_CLINIC_OR_DEPARTMENT_OTHER): Payer: Self-pay | Admitting: Nurse Practitioner

## 2017-03-28 DIAGNOSIS — Z1231 Encounter for screening mammogram for malignant neoplasm of breast: Secondary | ICD-10-CM

## 2017-03-29 ENCOUNTER — Ambulatory Visit (INDEPENDENT_AMBULATORY_CARE_PROVIDER_SITE_OTHER): Payer: Medicare Other | Admitting: Nurse Practitioner

## 2017-03-29 ENCOUNTER — Other Ambulatory Visit (INDEPENDENT_AMBULATORY_CARE_PROVIDER_SITE_OTHER): Payer: Self-pay

## 2017-03-29 VITALS — BP 118/74 | HR 66 | Temp 97.7°F | Resp 16 | Wt 116.0 lb

## 2017-03-29 DIAGNOSIS — Z681 Body mass index (BMI) 19 or less, adult: Secondary | ICD-10-CM

## 2017-03-29 DIAGNOSIS — A630 Anogenital (venereal) warts: Secondary | ICD-10-CM

## 2017-03-29 DIAGNOSIS — K644 Residual hemorrhoidal skin tags: Secondary | ICD-10-CM

## 2017-03-29 NOTE — Patient Instructions (Signed)
Patient Education     You may consider speaking with dermatology. May also return for removal/biopsy. If you have any new symptoms or irritation of rectal area, please return.      Genital Warts  Genital warts are painless skin bumps in your genital area. You may have a single wart or several grouped together. They can have a flat or rough surface. Genital wartscan appear on the penis, scrotum, vagina, vulva, or anus.  Genital warts are caused by the human papillomavirus (HPV).HPV is the most common sexually transmitted disease in the [place] Most people who become infected with HPV will not have any warts, but they can still pass the virus on to another person. HPV is passed on through skin contact with a wart during sex.  It can take 1 to 6 months for warts to appear after you are exposed to the virus. But sometimes a wart may not appear until years later.Genital warts can grow at different rates. Warts may grow and spread faster in people who are pregnant and who have a weak immune system.  About 30 types of HPV can cause genital warts. Most of these types cause no other problems. But a few types of HPV can infect a woman's cervix. If a cervical infection with one of these viruses goes undiagnosed and untreated, the woman is at higher risk for cervical cancer.It takes many months to years for signs of cancer to develop from an HPV virus infection of the cervix. Women who have genital warts and women who have a partner with genital warts should get regular Pap tests. These tests look for changes that may lead to cervical cancer.  Most warts go away on their own in 1 to 2 years. But it's important to consider treatment. Treatment can:   Make the warts go away sooner   Lower the risk of spreading HPV to others   Lower the risk for cervical cancer if you're a woman  Warts can be treated by freezing them, cutting them, removing them with a laser, using a prescription cream (imiquimod) to boost the immune system  around the warts, or applying a liquid or gel to dissolve them. You may need more than 1 treatment session to make the warts go away. But even after the warts are gone, the virus remains in your skin. The warts may reappear. Be sure to let your healthcare provider know if there's any chance that you could be pregnant before starting treatment.  You can get an HPV vaccine. The vaccine protects against certain types of HPV that cause genital warts and cervical cancer. Even though you have HPVnow, being vaccinated couldstillhelp protect you from getting other types of HPV in the future. This is true for your partner as well. Ask your healthcare provider if getting vaccinated makes sense for you. Urge your partner to also ask about getting vaccinated.  Home care  Follow these tips to care for yourself at home:   Apply an ointment or gel prescribed for you exactly as directed. Be careful not to get the ointment or gel on nearby healthy skin.   After applying the ointment or gel, keep the area clean and dry. Wear loose-fitting cotton underwear to avoid chafing.   If you have pain after treatment, sit in a tub with a few inches of warm water for 20 minutes. Do this 3 times a day for the next 2 to 3days. Add a cup of cornstarch or baking soda, or a packet of  colloidal oatmeal or astringent solution powder, to the water. This will help soothe your skin.  How to prevent passing on the virus  The HPV virus is passed on to other people by having sex with someone who is infected.The risk of passing on the virus is greatest when you have warts. But there is a chance of spreading the virus even after treatment, when the wart can't be seen. Condoms offer only limited protection from the warts. This is because HPV can infect skin in the genital area not covered by the condom.  Partners usually have the same type of HPV, so it's probably not necessary to refrain from sex with your current partner. But don't have sex with any  new partner until all visible warts are gone. If you're a man, tell all current and future sexual partners that you have had genital warts. This way, the woman can be sure to have regular Pap test.  Follow-up care  When youfirst learn thatyouhave genital warts, youmay feel guilty,angry, or emotionally upset. Getting the facts helps put you back in control. Follow up with your healthcare provider or your local public health department. You can get a complete STD screening, including HIV testing. For more information, call the national STD hotline at (249) 625-4389. After treatment, you should be rechecked in about 3 months to make sure all warts are gone.  When to seek medical advice  Call your healthcare provider right away if any of these occur after treating a wart:   Redness or burning on your skin that doesn't stop within a few hours   Swelling of the skin around the treated area   Pus draining from the treated area  Otherwise, get prompt medical attention if you have:   Difficulty urinating or having a bowel movement   Fluid coming from the vagina or penis   Skin rash or pain in a joint  Date Last Reviewed: 10/22/2014   2000-2017 [employer]. [address], [place] [address]. All rights reserved. This information is not intended as a substitute for professional medical care. Always follow your healthcare professional's instructions.

## 2017-03-29 NOTE — Progress Notes (Signed)
Flu Vaccine - got at the [facility]    Patient roomed by Reeves Forth, [place]

## 2017-03-29 NOTE — Progress Notes (Signed)
Patient Referred By: No referring provider defined for this encounter.    Patient's PCP: Leary Roca, MD    Chief Complaint:  Paula Bowman is an 67 year old female here for   Chief Complaint   Patient presents with   . Skin Problem     outside of rectum area, 2 growths- was prescribed vag cream for urethral caruncle- concerned if cream is associated with the growths. Noticed within the past couple months       The following portions of the patient's history were reviewed with the patient and updated as appropriate: problem list, current medications, allergies and past medical history.    History of Present Illness:  Paula Bowman is a 67 year old female who presents today for:    1. 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  She feels that this cream was spreading to anus and is concerned this may have caused some abnormalities  She has stopped taking the premarin cream due to breast tenderness, although per last visit note with Urogyn regarding these symtpoms, she was continuing cream use  Her symptoms include sensation of exterior growths, two, that she feels are like strings coming out from inside    She notices them when she sits and when she wipes  She does not have pain, itching, burning, discomfort  She has no bleeding either with passing stool or with wiping      Review of Systems:  Denies fever or chills  Denies unintentional weight loss  Denies dysuria or vaginal discharge    Objective:  Vitals:    03/29/17 1456   BP: 118/74   BP Cuff Size: Regular   BP Site: Right Arm   BP Position: Sitting   Pulse: 66   Resp: 16   Temp: 97.7 F (36.5 C)   TempSrc: Temporal   SpO2: 100%   Weight: 116 lb (52.6 kg)       Physical Exam:  Physical Exam   Constitutional: She is oriented to person, place, and time and well-developed, well-nourished, and in no distress.   HENT:   Head: Normocephalic and atraumatic.   Eyes: Pupils are  equal, round, and reactive to light. Conjunctivae are normal.   Pulmonary/Chest: Effort normal.   Genitourinary:   Genitourinary Comments: Two perianal keratinized skin tags present on posterior anus  No external hemorrhoids or fissures  Internal exam, normal rectal tone, no internal masses palpated   Neurological: She is alert and oriented to person, place, and time. Gait normal.   Skin: Skin is warm and dry.   Psychiatric: Mood, memory, affect and judgment normal.       Chart data reviewed y  Labs data reviewed y  Imaging reviewed n/a        Assessment and Plan:    ICD-10-CM ICD-9-CM    1. 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

## 2017-04-02 ENCOUNTER — Ambulatory Visit (INDEPENDENT_AMBULATORY_CARE_PROVIDER_SITE_OTHER): Payer: Medicare Other | Admitting: Physician Assistant

## 2017-04-02 ENCOUNTER — Other Ambulatory Visit: Payer: Self-pay

## 2017-04-02 VITALS — BP 118/86 | HR 78 | Temp 98.1°F | Resp 16 | Ht 70.0 in | Wt 188.0 lb

## 2017-04-02 DIAGNOSIS — J0140 Acute pansinusitis, unspecified: Secondary | ICD-10-CM

## 2017-04-02 MED ORDER — FLUTICASONE PROPIONATE 50 MCG/ACT NA SUSP: 2.0000 | Freq: Every day | NASAL | 12 refills | Status: DC

## 2017-04-02 MED ORDER — DOXYCYCLINE HYCLATE 100 MG PO CAPS: 100.0000 mg | ORAL_CAPSULE | Freq: Two times a day (BID) | ORAL | 0 refills | Status: DC

## 2017-04-02 NOTE — Patient Instructions (Addendum)
Please hydrate well with 64 oz of water if not more. You can also use extra strength mucinex twice per day for 7 days.  It would be 1200mg  twice per day.   Sinusitis, Adult Sinusitis is soreness and inflammation of your sinuses. Sinuses are hollow spaces in the bones around your face. They are located:  Around your eyes.  In the middle of your forehead.  Behind your nose.  In your cheekbones.  Your sinuses and nasal passages are lined with a stringy fluid (mucus). Mucus normally drains out of your sinuses. When your nasal tissues get inflamed or swollen, the mucus can get trapped or blocked so air cannot flow through your sinuses. This lets bacteria, viruses, and funguses grow, and that leads to infection. Follow these instructions at home: Medicines  Take, use, or apply over-the-counter and prescription medicines only as told by your doctor. These may include nasal sprays.  If you were prescribed an antibiotic medicine, take it as told by your doctor. Do not stop taking the antibiotic even if you start to feel better. Hydrate and Humidify  Drink enough water to keep your pee (urine) clear or pale yellow.  Use a cool mist humidifier to keep the humidity level in your home above 50%.  Breathe in steam for 10-15 minutes, 3-4 times a day or as told by your doctor. You can do this in the bathroom while a hot shower is running.  Try not to spend time in cool or dry air. Rest  Rest as much as possible.  Sleep with your head raised (elevated).  Make sure to get enough sleep each night. General instructions  Put a warm, moist washcloth on your face 3-4 times a day or as told by your doctor. This will help with discomfort.  Wash your hands often with soap and water. If there is no soap and water, use hand sanitizer.  Do not smoke. Avoid being around people who are smoking (secondhand smoke).  Keep all follow-up visits as told by your doctor. This is important. Contact a doctor  if:  You have a fever.  Your symptoms get worse.  Your symptoms do not get better within 10 days. Get help right away if:  You have a very bad headache.  You cannot stop throwing up (vomiting).  You have pain or swelling around your face or eyes.  You have trouble seeing.  You feel confused.  Your neck is stiff.  You have trouble breathing. This information is not intended to replace advice given to you by your health care provider. Make sure you discuss any questions you have with your health care provider. Document Released: 07/26/2007 Document Revised: 10/03/2015 Document Reviewed: 12/02/2014 Elsevier Interactive Patient Education  [employer].

## 2017-04-02 NOTE — Progress Notes (Signed)
PRIMARY CARE AT [facility] [address], [place] [address] [phone]  Date:  04/02/2017   Name:  Catherine Mora   DOB:  August 24, 1950   MRN:  [id number]  PCP:  Cassandria Anger, MD    History of Present Illness:  Catherine Mora is a 67 y.o. female patient who presents to PCP with  Chief Complaint  Patient presents with  . Sinusitis    x 1 wk  . Sore Throat    x 1wk     About 2 weeks ago, patient has pain at the center of her forehead. She has some bloody mucus She has sore throat. She has ear discomfort. She feels drained. She has had a hx of sinus infection.   She does not take any allergy medication.   No fever. Coughing is not apparent. She does have some sneezing.   She has not taken anything for her symptoms.   Patient Active Problem List   Diagnosis Date Noted  . Allergic rhinitis 10/03/2016  . Rash 06/26/2016  . Malnutrition of moderate degree ([place]) 08/18/2014  . Dehydration 08/17/2014  . Nausea with vomiting 08/17/2014  . DKA (diabetic ketoacidoses) ([place]) 08/17/2014  . AKI (acute kidney injury) ([place]) 08/17/2014  . Polydipsia 08/13/2014  . DM2 (diabetes mellitus, type 2) ([place]) 08/13/2014  . 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  . URI, acute 12/03/2012  . Urticaria 12/03/2012  . Cervical adenopathy 12/03/2012  . HTN (hypertension) 10/03/2011  . Sleep disturbance 10/03/2011  . Well adult exam 05/24/2010  . CHOLELITHIASIS 05/26/2009  . HAIR LOSS 03/12/2009  . DIZZINESS 06/30/2008  . GLUCOSE INTOLERANCE 05/08/2007  . Adjustment disorder with mixed anxiety and depressed mood 05/08/2007  . GERD 05/08/2007  . IBS 05/08/2007  . FATIGUE 05/08/2007  . Chest pain with moderate risk for cardiac etiology 05/08/2007  . Hyperlipidemia 09/15/2006  . ABNORMAL GLUCOSE NEC 09/15/2006    Past Medical History:  Diagnosis Date  . Allergy   . Anxiety   . Cataract    starting, no surgery yet  . Cholelithiasis 2011  . Depression   . Diabetes  mellitus without complication ([place])   . GERD (gastroesophageal reflux disease)   . Glucose intolerance (impaired glucose tolerance)   . HTN (hypertension)   . Hyperlipidemia   . IBS (irritable bowel syndrome)     Past Surgical History:  Procedure Laterality Date  . ABDOMINAL HYSTERECTOMY    . CHOLECYSTECTOMY N/A 07/03/2012   Procedure: LAPAROSCOPIC CHOLECYSTECTOMY WITH INTRAOPERATIVE CHOLANGIOGRAM;  Surgeon: Zenovia Jarred, MD;  Location: [place];  Service: General;  Laterality: N/A;  . COLONOSCOPY    . cyst removed     x3  . OOPHORECTOMY      Social History   Tobacco Use  . Smoking status: Never Smoker  . Smokeless tobacco: Never Used  Substance Use Topics  . Alcohol use: Yes    Comment: very little  . Drug use: No    Family History  Problem Relation Age of Onset  . Diabetes Mother   . Diabetes Sister   . Diabetes Brother   . Diabetes Other   . Stomach cancer Paternal Uncle   . Heart disease Neg Hx   . Colon cancer Neg Hx   . Esophageal cancer Neg Hx   . Rectal cancer Neg Hx   . Colon polyps Neg Hx     Allergies  Allergen Reactions  . Ciprofloxacin Hives    REACTION: itching  . Crestor [Rosuvastatin Calcium]  Other (See Comments)    arthralgia  . Hydrochlorothiazide Other (See Comments)    REACTION: hair loss  . Invokana [Canagliflozin] Other (See Comments)    ketoacidosis  . Paroxetine Other (See Comments)    REACTION: living in glass  . Penicillins Hives  . Simvastatin Other (See Comments)    REACTION: aches with statins    Medication list has been reviewed and updated.  Current Outpatient Medications on File Prior to Visit  Medication Sig Dispense Refill  . ALPRAZolam (XANAX) 1 MG tablet Take 0.5-1 tablets (0.5-1 mg total) 2 (two) times daily as needed by mouth for sleep or anxiety. 60 tablet 3  . BIOTIN FORTE PO Take 500 mg by mouth daily.    . bisacodyl (DULCOLAX) 5 MG EC tablet Take 1 tablet (5 mg total) by mouth daily as needed for moderate  constipation. 30 tablet 3  . hydrocortisone 2.5 % cream Apply topically 2 (two) times daily. 30 g 0  . ibuprofen (ADVIL,MOTRIN) 200 MG tablet Take 400 mg by mouth every 6 (six) hours as needed for headache.    . Insulin Detemir (LEVEMIR FLEXPEN) 100 UNIT/ML Pen Inject 16 Units into the skin daily with breakfast. (Patient taking differently: Inject 26 Units into the skin daily at 10 pm. ) 15 mL 2  . Insulin Pen Needle 31G X 5 MM MISC Use with Levemir pen 100 each 0  . Krill Oil 1000 MG CAPS Take 1,000 mg by mouth daily.    Glory Rosebush VERIO test strip 1 each by Other route 2 (two) times daily.      No current facility-administered medications on file prior to visit.     ROS ROS otherwise unremarkable unless listed above.  Physical Examination: BP 118/86   Pulse 78   Temp 98.1 F (36.7 C) (Oral)   Resp 16   Ht 5\' 10"  (1.778 m)   Wt 188 lb (85.3 kg)   SpO2 100%   BMI 26.98 kg/m  Ideal Body Weight: Weight in (lb) to have BMI = 25: 173.9  Physical Exam  Constitutional: She is oriented to person, place, and time. She appears well-developed and well-nourished. No distress.  HENT:  Head: Normocephalic and atraumatic.  Right Ear: Tympanic membrane, external ear and ear canal normal.  Left Ear: Tympanic membrane, external ear and ear canal normal.  Nose: Mucosal edema and rhinorrhea present. Right sinus exhibits maxillary sinus tenderness. Right sinus exhibits no frontal sinus tenderness. Left sinus exhibits maxillary sinus tenderness. Left sinus exhibits no frontal sinus tenderness.  Mouth/Throat: No uvula swelling. No oropharyngeal exudate, posterior oropharyngeal edema or posterior oropharyngeal erythema.  Eyes: Conjunctivae and EOM are normal. Pupils are equal, round, and reactive to light.  Cardiovascular: Normal rate and regular rhythm. Exam reveals no gallop, no distant heart sounds and no friction rub.  No murmur heard. Pulmonary/Chest: Effort normal. No respiratory distress. She  has no decreased breath sounds. She has no wheezes. She has no rhonchi.  Lymphadenopathy:       Head (right side): No submandibular, no tonsillar, no preauricular and no posterior auricular adenopathy present.       Head (left side): No submandibular, no tonsillar, no preauricular and no posterior auricular adenopathy present.  Neurological: She is alert and oriented to person, place, and time.  Skin: She is not diaphoretic.  Psychiatric: She has a normal mood and affect. Her behavior is normal.     Assessment and Plan: LANYAH SPENGLER is a 67 y.o. female who is  here today for cc of  Chief Complaint  Patient presents with  . Sinusitis    x 1 wk  . Sore Throat    x 1wk   Acute non-recurrent pansinusitis - Plan: fluticasone (FLONASE) 50 MCG/ACT nasal spray, doxycycline (VIBRAMYCIN) 100 MG capsule  Ivar Drape, PA-C [facility] 2/12/20199:40 AM

## 2017-04-03 ENCOUNTER — Encounter: Payer: Self-pay | Admitting: Physician Assistant

## 2017-04-04 DIAGNOSIS — Z124 Encounter for screening for malignant neoplasm of cervix: Secondary | ICD-10-CM | POA: Diagnosis not present

## 2017-04-04 DIAGNOSIS — L904 Acrodermatitis chronica atrophicans: Secondary | ICD-10-CM | POA: Diagnosis not present

## 2017-04-04 DIAGNOSIS — Z6828 Body mass index (BMI) 28.0-28.9, adult: Secondary | ICD-10-CM | POA: Diagnosis not present

## 2017-04-04 DIAGNOSIS — Z1231 Encounter for screening mammogram for malignant neoplasm of breast: Secondary | ICD-10-CM | POA: Diagnosis not present

## 2017-04-06 LAB — HM MAMMOGRAPHY

## 2017-04-27 ENCOUNTER — Encounter (HOSPITAL_BASED_OUTPATIENT_CLINIC_OR_DEPARTMENT_OTHER): Payer: Self-pay | Admitting: Nurse Practitioner

## 2017-04-30 ENCOUNTER — Ambulatory Visit: Payer: Medicare Other | Attending: Nurse Practitioner | Admitting: Nurse Practitioner

## 2017-04-30 ENCOUNTER — Ambulatory Visit (HOSPITAL_BASED_OUTPATIENT_CLINIC_OR_DEPARTMENT_OTHER): Payer: Medicare Other

## 2017-04-30 DIAGNOSIS — Z803 Family history of malignant neoplasm of breast: Secondary | ICD-10-CM | POA: Insufficient documentation

## 2017-04-30 DIAGNOSIS — Z170 Estrogen receptor positive status [ER+]: Secondary | ICD-10-CM | POA: Insufficient documentation

## 2017-04-30 DIAGNOSIS — M1990 Unspecified osteoarthritis, unspecified site: Secondary | ICD-10-CM | POA: Insufficient documentation

## 2017-04-30 DIAGNOSIS — D0511 Intraductal carcinoma in situ of right breast: Secondary | ICD-10-CM | POA: Insufficient documentation

## 2017-04-30 DIAGNOSIS — K589 Irritable bowel syndrome without diarrhea: Secondary | ICD-10-CM | POA: Insufficient documentation

## 2017-04-30 DIAGNOSIS — D259 Leiomyoma of uterus, unspecified: Secondary | ICD-10-CM | POA: Insufficient documentation

## 2017-04-30 DIAGNOSIS — M8588 Other specified disorders of bone density and structure, other site: Secondary | ICD-10-CM | POA: Insufficient documentation

## 2017-04-30 DIAGNOSIS — Z86000 Personal history of in-situ neoplasm of breast: Secondary | ICD-10-CM

## 2017-04-30 DIAGNOSIS — K219 Gastro-esophageal reflux disease without esophagitis: Secondary | ICD-10-CM | POA: Insufficient documentation

## 2017-04-30 DIAGNOSIS — Z1231 Encounter for screening mammogram for malignant neoplasm of breast: Secondary | ICD-10-CM

## 2017-04-30 DIAGNOSIS — Z9889 Other specified postprocedural states: Secondary | ICD-10-CM | POA: Insufficient documentation

## 2017-04-30 DIAGNOSIS — F411 Generalized anxiety disorder: Secondary | ICD-10-CM | POA: Insufficient documentation

## 2017-05-10 DIAGNOSIS — E119 Type 2 diabetes mellitus without complications: Secondary | ICD-10-CM | POA: Diagnosis not present

## 2017-05-10 DIAGNOSIS — Z6826 Body mass index (BMI) 26.0-26.9, adult: Secondary | ICD-10-CM | POA: Diagnosis not present

## 2017-05-10 DIAGNOSIS — Z794 Long term (current) use of insulin: Secondary | ICD-10-CM | POA: Diagnosis not present

## 2017-05-10 DIAGNOSIS — E663 Overweight: Secondary | ICD-10-CM | POA: Diagnosis not present

## 2017-05-30 ENCOUNTER — Encounter: Payer: Self-pay | Admitting: Physician Assistant

## 2017-06-21 ENCOUNTER — Encounter (INDEPENDENT_AMBULATORY_CARE_PROVIDER_SITE_OTHER): Payer: Self-pay | Admitting: Nurse Practitioner

## 2017-06-21 ENCOUNTER — Ambulatory Visit (INDEPENDENT_AMBULATORY_CARE_PROVIDER_SITE_OTHER): Payer: Medicare Other | Admitting: Nurse Practitioner

## 2017-06-21 VITALS — BP 107/72 | HR 74 | Temp 98.2°F | Resp 14 | Ht 67.01 in | Wt 115.0 lb

## 2017-06-21 DIAGNOSIS — Z0000 Encounter for general adult medical examination without abnormal findings: Secondary | ICD-10-CM

## 2017-06-21 DIAGNOSIS — G8929 Other chronic pain: Secondary | ICD-10-CM

## 2017-06-21 DIAGNOSIS — R42 Dizziness and giddiness: Secondary | ICD-10-CM

## 2017-06-21 DIAGNOSIS — J329 Chronic sinusitis, unspecified: Secondary | ICD-10-CM

## 2017-06-21 DIAGNOSIS — R55 Syncope and collapse: Secondary | ICD-10-CM

## 2017-06-21 DIAGNOSIS — M546 Pain in thoracic spine: Secondary | ICD-10-CM

## 2017-06-21 DIAGNOSIS — L03039 Cellulitis of unspecified toe: Secondary | ICD-10-CM

## 2017-06-21 MED ORDER — CLINDAMYCIN PHOSPHATE 1 % EX SOLN
Freq: Two times a day (BID) | CUTANEOUS | 1 refills | Status: DC
Start: 2017-06-21 — End: 2017-10-26

## 2017-06-21 MED ORDER — AZITHROMYCIN 250 MG OR TABS
ORAL_TABLET | ORAL | 0 refills | Status: DC
Start: 2017-06-21 — End: 2017-10-26

## 2017-06-21 NOTE — Progress Notes (Signed)
Patient roomed by Paula Bowman) D CMA  Accompanied by self .  Allergies , Medications and smoking hx verified and Vital taken for today's visit .    Reason for visit: Wellness       Cervical screening/PAP:NA  Mammo: UTD  Colon Screen: UTD  Have you seen a specialist since your last visit: NO        HM Due:   Health Maintenance   Topic Date Due   . Zoster Vaccine (2 of 3) 07/08/2012   . Depression Screening (PHQ-2)  09/08/2016   . Influenza Vaccine (Season Ended) 11/20/2017   . Lipid Disorders Screening  02/03/2018   . Breast Cancer Screening  05/01/2019   . Tetanus Vaccine  11/28/2023   . Colorectal Cancer Screening (Colonoscopy)  09/14/2024   . Osteoporosis Screening  Completed   . Pneumococcal Vaccine  Completed   . Hepatitis C Screening  Addressed              Future Appointments   Date Time Provider [place]   07/03/2017 11:00 AM Ander Slade, MD [place]

## 2017-06-21 NOTE — Patient Instructions (Addendum)
It was a pleasure to see you in clinic today. Your Medical Assistant was: Sabby                 You can schedule an appointment to see us by calling [phone] or via eCare.     If labs were ordered today the results are expected to be available via eCare 5 days later. Otherwise, result letters are mailed 7-10 days after your tests are completed. If your physician needs to change your care based on your results, you will receive a phone call to notify you. If you haven't heard from him/her and it has been more than 10 days please give us a call.     Thank you for choosing [facility].           Leading a Healthy Life  Six tips to help improve your health and wellness     This explains how these 6 basic guidelines may improve your health and wellness:   . Eat well to give your body the energy it needs.   . Stay or get active.   . A healthy mind is part of a healthy body.   . Practice safe living habits.   . Keep your mind and body free of harmful drugs and alcohol.   . Get regular health care.     Tip #1: Eat well to give your body the energy it needs.   Your body needs nutritious foods to stay strong and healthy.   Here are some general eating guidelines:   . Have 2 servings of fish or other seafood 2 times a week (1 serving = 4 ounces).   . If you eat dairy products, choose low-fat (1%) or nonfat ones.   . If you eat meat, cut down on the amount. Replace it with plant-based foods such as beans, whole grains, fruits and vegetables, and nuts and seeds.   . Have less than 1,500 mg of sodium (salt) a day.   . Cut down on "junk food" like alcohol, fatty foods, chips, candy, and other sweets.     Tip #2: Stay or get active.   Exercise for at least 30 minutes at a time, 3 times a week. Regular physical activity can help you:   . Live longer and feel better   . Be stronger and more flexible   . Build strong bones   . Prevent depression   . Strengthen your immune system   . Maintain a healthy body  weight     Tip #3: Remember: A healthy mind is part of a healthy body.   A good state of mind can help you make healthy choices. Here are a few tips for keeping your mind healthy:   . Reduce stress in your life.   . Make some time every day for things that are fun.   . Get enough sleep. Lack of sleep reduces how well you can concentrate, increases mood swings, and raises your risk of having a car accident.   . Ask your health care provider for help if you feel depressed or anxious for more than several days in a row.     Tip #4: Practice safe living habits.   Accidents and Injuries      . Accidents and injuries are the 5th leading cause of death in the [place]   . Accidents in the home cause thousands of permanent injuries every year.     The most common   accidents are fires, falls, and drowning. To help yourself and your family stay safe:   . Install smoke detectors on each floor of your home.   . Make sure everyone in your family knows how to swim  . Stay safe on the road:  o Wear a seatbelt.   o Do not ride with someone who has been drinking or taking drugs.   o Do not speak on a cell phone or send, read, or write text messages while you are driving.   o Wear a helmet when you ride a bicycle or motorcycle.   o Get enough sleep at night, and do not drive when you are tired.     Hand Hygiene   Protect yourself from germs by washing your hands often. Always wash your hands:   . After you change a diaper or use the toilet   . Before you start and after you finish preparing food     Tip #5: Keep your mind and body free of harmful drugs and alcohol.   Tobacco causes more health problems than any other substance. These problems include lung disease, heart disease, and many types of cancer. The nicotine in tobacco is the most addictive and widely used drug.   Too much alcohol can cause damage to your liver, heart, brain, bones, and other body tissues. Being under the influence of alcohol also increases your chance of being  injured in an accident.    Alcohol can cause fetal alcohol syndrome in your children if you drink regularly when you are pregnant.   Street drugs, like marijuana, cocaine, methamphetamine, heroin, or pain pills not prescribed by your doctor can harm your health. They may be mixed with harmful substances, and using them can cause people to put themselves in dangerous situations.     Tip #6: Get regular health care.   Many people think they need to see the doctor only when they are sick. But, health care providers can also help you stay healthy.   . Find a health care provider who works with you to manage your health.   . Ask your health care provider what diseases you are at risk for. Learn what you can do to prevent or control them.   . Get yourself and your family immunized against life-threatening diseases.

## 2017-06-21 NOTE — Progress Notes (Signed)
Patient Referred By: No referring provider defined for this encounter.    Patient's PCP: Leary Roca, MD    Chief Complaint:  Paula Bowman is an 67 year old female here for   Chief Complaint   Patient presents with   . Wellness       The following portions of the patient's history were reviewed with the patient and updated as appropriate: problem list, current medications, allergies and past medical history.    History of Present Illness:  Paula Bowman is a 67 year old female who presents today for complaints outside of the scope of wellness:    1. 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 Paronychia:  Redness and pain around toenail  She has been treating with PRN topical clindamycin and needs a refill  Her shoes do rub on toes  History of onychomycosis    3. Sinus congestion  Runny nose  Posterior nasal drainage  Congestion  Facial pressure without pain  No fever or chills  Nonproductive cough  Was improving but has worsened this week    30 pk yr smoking history (15 years 2ppd), quit many years ago  Lumpectomy with early breast cancer and tamoxifen tx  Anxiety, with panic-type symptoms    Review of Systems:  Review of Systems   Constitutional: Negative for chills, diaphoresis, fever and weight loss.   HENT: Negative for hearing loss and nosebleeds.    Eyes: Negative for blurred vision and double vision.   Respiratory: Negative for cough and shortness of breath.    Cardiovascular: Negative for chest pain and palpitations.   Gastrointestinal: Negative for abdominal pain, diarrhea, nausea and vomiting.   Genitourinary: Negative for dysuria.   Neurological: Positive for  dizziness and weakness. Negative for tingling, sensory change, speech change, focal weakness and headaches.   Endo/Heme/Allergies: Does not bruise/bleed easily.   Psychiatric/Behavioral: The patient is nervous/anxious and has insomnia.        Objective:  Vitals:    06/21/17 1313   BP: 107/72   BP Cuff Size: Regular   BP Site: Right Arm   BP Position: Sitting   Pulse: 74   Resp: 14   Temp: 98.2 F (36.8 C)   TempSrc: Temporal   SpO2: 100%   Weight: 115 lb (52.2 kg)   Height: 5' 7.01" (1.702 m)       Physical Exam:  Physical Exam   Constitutional: She is oriented to person, place, and time and well-developed, well-nourished, and in no distress.   HENT:   Head: Normocephalic and atraumatic.   Right Ear: Tympanic membrane is erythematous.   Left Ear: Tympanic membrane is erythematous.   Nose: Rhinorrhea present. No sinus tenderness. Right sinus exhibits no maxillary sinus tenderness and no frontal sinus tenderness. Left sinus exhibits no maxillary sinus tenderness and no frontal sinus tenderness.   Mouth/Throat: No oropharyngeal exudate or posterior oropharyngeal erythema.   Eyes: Pupils are equal, round, and reactive to light. Conjunctivae and EOM are normal.   Cardiovascular: Normal rate, regular rhythm and normal heart sounds.   No murmur heard.  Pulmonary/Chest: Effort normal and breath sounds normal. No  respiratory distress. She has no wheezes. She has no rales.   Neurological: She is alert and oriented to person, place, and time. She has normal sensation, normal strength, normal reflexes and intact cranial nerves. She displays facial symmetry. Gait normal. Coordination and gait normal.   Reflex Scores:       Patellar reflexes are 2+ on the right side and 2+ on the left side.  Skin: Skin is warm and dry.   Psychiatric: Memory and judgment normal. Her mood appears anxious. Her affect is labile. She is agitated.   Nursing note and vitals reviewed.      Chart data reviewed y  Labs data reviewed y  Imaging reviewed  y        Assessment and Plan:    ICD-10-CM ICD-9-CM    1. Encounter for general adult medical examination w/o abnormal findings Z00.00 V70.9    2. Chronic bilateral thoracic back pain M54.6 724.1 CBC    G89.29 433.29 Basic Metabolic Panel   3. Recurrent sinusitis J32.9 473.9 Azithromycin 250 MG Oral Tab   4. Near syncope R55 780.2 EKG 12-LEAD   5. Dizziness R42 780.4    6. Paronychia of toe, unspecified laterality L03.039 681.11 clindamycin 1 % External Solution     Patient was agitated today, voicing frustration about multiple topics and was difficult to follow in conversation.  I did emphasize we are available to support her however we can.  =============================================================  Paula Bowman is a 67 year old female here today for a preventive health visit.   Other problems or concerns today: Reviewed with patient     GYN HISTORY  OB History   Gravida Para Term Preterm AB Living   0 0 0 0 0 0   SAB TAB Ectopic Multiple Live Births   0 0 0 0 0       Menopause: surgical menopause  Menopausal symptoms: none  Any postmenopausal bleeding: No  Incontinence: No  Hormone replacement therapy: never  Last pap: 2012  Screening for osteoporosis: NO  Other gyn history: none      CANCER SCREENING  Last pap: 2012  Additional Pap testing indicated: NO  Family history of colon cancer: NO  Family history of uterine or ovarian cancer: NO  Family history of breast cancer: YES: sister     Last mammogram:  2019  History of abnormal mammogram: YES: fibroglandular, lumpectomy       SEXUAL HISTORY  Sexual activity: yes, single partner, contraception not required  Contraception: NOT INDICATED  History of STDs: none known  HIV risk: Denies, multiple sexual partners, history of a sexually transmitted disease, transfusion between 1978 and 5188 or use of illicit drugs by injection  New partner(s) since last STD check: No    Sexual concerns: No    History of sexual or physical abuse: Not asked today  History  of any other forms of abuse (e.g. verbal, financial): Not asked today  Has the patient been hit, kicked, punched, or otherwise hurt by someone within the past year: Not asked today    LIFESTYLE  Current dietary habits: healthy diet in general  Calcium: dietary sources only  Vitamins: none  Current exercise habits: generally active every day > 30 min  Regular seat belt use: YES  Substance use:  reports that she quit smoking about 29 years ago. Her smoking use included cigarettes. She has a 30.00 pack-year smoking history. She has never used smokeless tobacco. She reports that she drinks about 0.5  oz of alcohol per week. She reports that she does not use drugs.--2 servings of alcohol daily  Exposure to hazardous materials: No  Guns in the house: Not asked today  Activities of daily living (ADL): independent    Review Of Systems:  As above and:  Regular dental exams: yes  Constitutional: Denies fever chills   Eye: Denies, blurry vision, diplopia, eye pain  Respiratory: Denies dyspnea on exertion dyspnea at rest cough wheezing   Cardiovascular: Denies any cardiovascular problems   GI: Denies nausea vomiting   GU: Denies, dysuria, hematuria, urinary incontinence, urinary urgency, vaginal discharge, abnormal vaginal bleeding, pelvic pain  Neurologic: Denies syncope focal weakness   Psych: Denies depressed mood sleep disturbance  Rheumatologic/Musculoskeletal: Denies, joint swelling, joint stiffness, any joint or arthritic problems  Derm: Denies, skin rash, easy bruising, hair loss, changes in moles or new moles    PHYSICAL EXAM:  BP 107/72   Pulse 74   Temp 98.2 F (36.8 C) (Temporal)   Resp 14   Ht 5' 7.01" (1.702 m)   Wt 115 lb (52.2 kg)   SpO2 100%   BMI 18.01 kg/m   Body mass index is 18.01 kg/m.  General: healthy, alert, no distress  Evident hearing difficulty?: NO  Head: Normocephalic. No masses, lesions, tenderness or abnormalities  supple. No adenopathy. Thyroid symmetric, normal size, without  nodules  Lungs: CTA  Heart: normal rate, regular rhythm and no murmurs, clicks, or gallops  Skin: Skin color, texture, turgor normal. No rashes or concerning lesions  Abdomen: exam deferred  Neuro:  Grossly normal to observation, gait normal  Breasts: deferred  Pelvic exam: deferred    ASSESSMENT AND PLAN:    1. Encounter for general adult medical examination w/o abnormal findings    2. Chronic bilateral thoracic back pain  - CBC  - Basic Metabolic Panel    3. Recurrent sinusitis  - Azithromycin 250 MG Oral Tab; Take 2 tablets today, then take 1 tablet every day until gone.  Dispense: 6 tablet; Refill: 0    4. Near syncope  - EKG 12-LEAD    5. Dizziness    6. Paronychia of toe, unspecified laterality  - clindamycin 1 % External Solution; Apply to affected area on toe(s) 2 times a day.  Dispense: 30 mL; Refill: 1    Health Maintenance   Topic Date Due   . Zoster Vaccine (2 of 3) 07/08/2012   . Influenza Vaccine (Season Ended) 11/20/2017   . Lipid Disorders Screening  02/03/2018   . Depression Screening (PHQ-2)  06/22/2018   . Breast Cancer Screening  05/01/2019   . Tetanus Vaccine  11/28/2023   . Colorectal Cancer Screening (Colonoscopy)  09/14/2024   . Osteoporosis Screening  Completed   . Pneumococcal Vaccine  Completed   . Hepatitis C Screening  Addressed       Screening tests discussed: STDs, Colon cancer: DISCUSSED and Osteoporosis (>65 or high risk)  Immunizations or studies due: as ordered below  HIV screening offered: declined    Preventive counseling: skin cancer prevention/self-exam  moderation in EtOH use/no drinking and driving  diet rich in 3 omega fatty acids and low in saturated fats  adequate calcium intake  vitamin D supplementation  proper exercise  Follow-up: 3 months

## 2017-06-22 LAB — BASIC METABOLIC PANEL
Anion Gap: 8 (ref 4–12)
Calcium: 9.6 mg/dL (ref 8.9–10.2)
Carbon Dioxide, Total: 28 meq/L (ref 22–32)
Chloride: 102 meq/L (ref 98–108)
Creatinine: 0.65 mg/dL (ref 0.38–1.02)
GFR, Calc, African American: >60 mL/min/{1.73_m2} (ref >59)
GFR, Calc, European American: >60 mL/min/{1.73_m2} (ref >59)
Glucose: 128 mg/dL — ABNORMAL HIGH (ref 62–125)
Potassium: 3.9 meq/L (ref 3.6–5.2)
Sodium: 138 meq/L (ref 135–145)
Urea Nitrogen: 16 mg/dL (ref 8–21)

## 2017-06-22 LAB — CBC (HEMOGRAM)
Hematocrit: 39 % (ref 36–45)
Hemoglobin: 13.1 g/dL (ref 11.5–15.5)
MCH: 33.1 pg (ref 27.3–33.6)
MCHC: 33.4 g/dL (ref 32.2–36.5)
MCV: 99 fL — ABNORMAL HIGH (ref 81–98)
Platelet Count: 197 10*3/uL (ref 150–400)
RBC: 3.96 10*6/uL (ref 3.80–5.00)
RDW-CV: 12.2 % (ref 11.6–14.4)
WBC: 6.1 10*3/uL (ref 4.3–10.0)

## 2017-06-22 LAB — EKG 12 LEAD
Atrial Rate: 56 {beats}/min
P Axis: 72 degrees
P-R Interval: 130 ms
Q-T Interval: 414 ms
QRS Duration: 100 ms
QTC Calculation: 399 ms
R Axis: 58 degrees
T Axis: 67 degrees
Ventricular Rate: 56 {beats}/min

## 2017-06-25 ENCOUNTER — Ambulatory Visit: Payer: Self-pay | Admitting: *Deleted

## 2017-06-25 NOTE — Telephone Encounter (Signed)
Tick  Was  Flat  Appears  To  Be  A  Deer tick. Uncertain on how  Long the  Tick  was  On her  The best estimate is  1  Day   Appointment made for tomorrow with Jodi Mourning     Answer Assessment - Initial Assessment Questions 1. TYPE of TICK: "Is it a wood tick or a deer tick?" If unsure, ask: "What size was the tick?" "Did it look more like a watermelon seed or a poppy seed?"     Deer tick   2. LOCATION: "Where is the tick bite located?"       R ankle   3. ONSET: "How long do you think the tick was attached before you removed it?" (Hours or days)        1 day  4. TETANUS: "When was the last tetanus booster?"        10-10-12  5. PREGNANCY: "Is there any chance you are pregnant?" "When was your last menstrual period?"          N/a  Protocols used: TICK BITE-A-AH

## 2017-06-26 ENCOUNTER — Ambulatory Visit (INDEPENDENT_AMBULATORY_CARE_PROVIDER_SITE_OTHER): Payer: Medicare Other | Admitting: Family

## 2017-06-26 ENCOUNTER — Encounter: Payer: Self-pay | Admitting: Family

## 2017-06-26 VITALS — BP 118/84 | HR 76 | Temp 98.4°F | Ht 70.0 in | Wt 187.1 lb

## 2017-06-26 DIAGNOSIS — W57XXXA Bitten or stung by nonvenomous insect and other nonvenomous arthropods, initial encounter: Secondary | ICD-10-CM | POA: Diagnosis not present

## 2017-06-26 DIAGNOSIS — S80861A Insect bite (nonvenomous), right lower leg, initial encounter: Secondary | ICD-10-CM

## 2017-06-26 MED ORDER — DOXYCYCLINE HYCLATE 100 MG PO TABS
100.0000 mg | ORAL_TABLET | Freq: Two times a day (BID) | ORAL | 0 refills | Status: DC
Start: 2017-06-26 — End: 2017-10-12

## 2017-06-26 NOTE — Progress Notes (Signed)
Catherine Mora is a 67 y.o. female with the following history as recorded in Epic[employer]:  Patient Active Problem List   Diagnosis Date Noted  . Allergic rhinitis 10/03/2016  . Rash 06/26/2016  . Malnutrition of moderate degree ([place]) 08/18/2014  . Dehydration 08/17/2014  . Nausea with vomiting 08/17/2014  . DKA (diabetic ketoacidoses) ([place]) 08/17/2014  . AKI (acute kidney injury) ([place]) 08/17/2014  . Polydipsia 08/13/2014  . DM2 (diabetes mellitus, type 2) ([place]) 08/13/2014  . 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  . URI, acute 12/03/2012  . Urticaria 12/03/2012  . Cervical adenopathy 12/03/2012  . HTN (hypertension) 10/03/2011  . Sleep disturbance 10/03/2011  . Well adult exam 05/24/2010  . CHOLELITHIASIS 05/26/2009  . HAIR LOSS 03/12/2009  . DIZZINESS 06/30/2008  . GLUCOSE INTOLERANCE 05/08/2007  . Adjustment disorder with mixed anxiety and depressed mood 05/08/2007  . GERD 05/08/2007  . IBS 05/08/2007  . FATIGUE 05/08/2007  . Chest pain with moderate risk for cardiac etiology 05/08/2007  . Hyperlipidemia 09/15/2006  . ABNORMAL GLUCOSE NEC 09/15/2006    Current Outpatient Medications  Medication Sig Dispense Refill  . ALPRAZolam (XANAX) 1 MG tablet Take 0.5-1 tablets (0.5-1 mg total) 2 (two) times daily as needed by mouth for sleep or anxiety. 60 tablet 3  . Insulin Detemir (LEVEMIR FLEXPEN) 100 UNIT/ML Pen Inject 16 Units into the skin daily with breakfast. (Patient taking differently: Inject 26 Units into the skin daily at 10 pm. ) 15 mL 2  . BIOTIN FORTE PO Take 500 mg by mouth daily.    . bisacodyl (DULCOLAX) 5 MG EC tablet Take 1 tablet (5 mg total) by mouth daily as needed for moderate constipation. (Patient not taking: Reported on 06/26/2017) 30 tablet 3  . doxycycline (VIBRA-TABS) 100 MG tablet Take 1 tablet (100 mg total) by mouth 2 (two) times daily. 2 tablet 0  . fluticasone (FLONASE) 50 MCG/ACT nasal spray Place 2 sprays into both nostrils daily. (Patient  not taking: Reported on 06/26/2017) 16 g 12  . hydrocortisone 2.5 % cream Apply topically 2 (two) times daily. (Patient not taking: Reported on 06/26/2017) 30 g 0  . ibuprofen (ADVIL,MOTRIN) 200 MG tablet Take 400 mg by mouth every 6 (six) hours as needed for headache.    . insulin aspart (NOVOLOG) 100 UNIT/ML FlexPen Take as directed per sliding scale.  Max daily dose: 50 units/day.    . Insulin Pen Needle 31G X 5 MM MISC Use with Levemir pen (Patient not taking: Reported on 06/26/2017) 100 each 0  . Krill Oil 1000 MG CAPS Take 1,000 mg by mouth daily.    Marland Kitchen NOVOLOG FLEXPEN 100 UNIT/ML FlexPen TAKE AS DIRECTED PER SLIDING SCALE. MAX DAILY DOSE: 50 UNITS/DAY.  3  . ONETOUCH VERIO test strip 1 each by Other route 2 (two) times daily.      No current facility-administered medications for this visit.     Allergies: Ciprofloxacin; Crestor [rosuvastatin calcium]; Hydrochlorothiazide; Invokana [canagliflozin]; Paroxetine; Penicillins; and Simvastatin  Past Medical History:  Diagnosis Date  . Allergy   . Anxiety   . Cataract    starting, no surgery yet  . Cholelithiasis 2011  . Depression   . Diabetes mellitus without complication ([place])   . GERD (gastroesophageal reflux disease)   . Glucose intolerance (impaired glucose tolerance)   . HTN (hypertension)   . Hyperlipidemia   . IBS (irritable bowel syndrome)     Past Surgical History:  Procedure Laterality Date  . ABDOMINAL HYSTERECTOMY    .  CHOLECYSTECTOMY N/A 07/03/2012   Procedure: LAPAROSCOPIC CHOLECYSTECTOMY WITH INTRAOPERATIVE CHOLANGIOGRAM;  Surgeon: Zenovia Jarred, MD;  Location: [facility];  Service: General;  Laterality: N/A;  . COLONOSCOPY    . cyst removed     x3  . OOPHORECTOMY      Family History  Problem Relation Age of Onset  . Diabetes Mother   . Diabetes Sister   . Diabetes Brother   . Diabetes Other   . Stomach cancer Paternal Uncle   . Heart disease Neg Hx   . Colon cancer Neg Hx   . Esophageal cancer Neg Hx   . Rectal  cancer Neg Hx   . Colon polyps Neg Hx     Social History   Tobacco Use  . Smoking status: Never Smoker  . Smokeless tobacco: Never Used  Substance Use Topics  . Alcohol use: Yes    Comment: very little    Subjective:  Patient pulled a tick off her right ankle yesterday; thinks tick was attached for a day at the most; denies any fever, body aches, rash on lower extremities; notes she is concerned about complications from her diabetes if she were to wait and have complications from the tick bite; able to remove the tick completley;   Objective:  Vitals:   06/26/17 1110  BP: 118/84  Pulse: 76  Temp: 98.4 F (36.9 C)  TempSrc: Oral  SpO2: 97%  Weight: 187 lb 1.3 oz (84.9 kg)  Height: 5\' 10"  (1.778 m)    General: Well developed, well nourished, in no acute distress  Skin : Warm and dry. No rash noted; small localized erythematous area was tick was attached Head: Normocephalic and atraumatic  Lungs: Respirations unlabored; clear to auscultation bilaterally without wheeze, rales, rhonchi  Musculoskeletal: No deformities; no active joint inflammation  Extremities: No edema, cyanosis, clubbing  Vessels: Symmetric bilaterally  Neurologic: Alert and oriented; speech intact; face symmetrical; moves all extremities well; CNII-XII intact without focal deficit   Assessment:  1. Tick bite, initial encounter     Plan:  Rx for Doxycycline 100 mg 1 po bid x 1 day #2/ 0 rf; reassurance given; patient understands to follow-up if she develops rash, fever, body aches or bulls-eye appearance at site of bite; follow-up worse, no better.   Return in about 3 months (around 09/29/2017) for CPE with Dr. Alain Marion.  No orders of the defined types were placed in this encounter.   Requested Prescriptions   Signed Prescriptions Disp Refills  . doxycycline (VIBRA-TABS) 100 MG tablet 2 tablet 0    Sig: Take 1 tablet (100 mg total) by mouth 2 (two) times daily.

## 2017-07-03 ENCOUNTER — Encounter (HOSPITAL_BASED_OUTPATIENT_CLINIC_OR_DEPARTMENT_OTHER): Payer: Medicare Other | Admitting: Obstetrics/Gynecology

## 2017-08-01 ENCOUNTER — Other Ambulatory Visit (HOSPITAL_BASED_OUTPATIENT_CLINIC_OR_DEPARTMENT_OTHER): Payer: Self-pay | Admitting: Obstetrics/Gynecology

## 2017-09-11 DIAGNOSIS — L218 Other seborrheic dermatitis: Secondary | ICD-10-CM | POA: Diagnosis not present

## 2017-09-11 DIAGNOSIS — L650 Telogen effluvium: Secondary | ICD-10-CM | POA: Diagnosis not present

## 2017-09-12 DIAGNOSIS — L659 Nonscarring hair loss, unspecified: Secondary | ICD-10-CM | POA: Diagnosis not present

## 2017-09-13 ENCOUNTER — Other Ambulatory Visit: Payer: Self-pay | Admitting: Internal Medicine

## 2017-09-14 NOTE — Telephone Encounter (Signed)
Checked controlled substance database, last filled 05/28/17.

## 2017-10-01 ENCOUNTER — Encounter (HOSPITAL_BASED_OUTPATIENT_CLINIC_OR_DEPARTMENT_OTHER): Payer: Medicare Other | Admitting: Obstetrics/Gynecology

## 2017-10-12 ENCOUNTER — Other Ambulatory Visit (INDEPENDENT_AMBULATORY_CARE_PROVIDER_SITE_OTHER): Payer: Medicare Other

## 2017-10-12 ENCOUNTER — Ambulatory Visit (INDEPENDENT_AMBULATORY_CARE_PROVIDER_SITE_OTHER): Payer: Medicare Other | Admitting: Internal Medicine

## 2017-10-12 ENCOUNTER — Encounter: Payer: Self-pay | Admitting: Internal Medicine

## 2017-10-12 VITALS — BP 124/80 | HR 70 | Temp 97.9°F | Ht 70.0 in | Wt 193.0 lb

## 2017-10-12 DIAGNOSIS — R229 Localized swelling, mass and lump, unspecified: Secondary | ICD-10-CM

## 2017-10-12 DIAGNOSIS — E119 Type 2 diabetes mellitus without complications: Secondary | ICD-10-CM | POA: Diagnosis not present

## 2017-10-12 DIAGNOSIS — E785 Hyperlipidemia, unspecified: Secondary | ICD-10-CM

## 2017-10-12 DIAGNOSIS — S8011XA Contusion of right lower leg, initial encounter: Secondary | ICD-10-CM

## 2017-10-12 DIAGNOSIS — Z0000 Encounter for general adult medical examination without abnormal findings: Secondary | ICD-10-CM | POA: Diagnosis not present

## 2017-10-12 DIAGNOSIS — Z794 Long term (current) use of insulin: Secondary | ICD-10-CM

## 2017-10-12 DIAGNOSIS — I10 Essential (primary) hypertension: Secondary | ICD-10-CM

## 2017-10-12 LAB — URINALYSIS, ROUTINE W REFLEX MICROSCOPIC
Bilirubin Urine: NEGATIVE
Ketones, ur: NEGATIVE
LEUKOCYTES UA: NEGATIVE
NITRITE: NEGATIVE
Specific Gravity, Urine: 1.015 (ref 1.000–1.030)
Total Protein, Urine: NEGATIVE
URINE GLUCOSE: NEGATIVE
Urobilinogen, UA: 0.2 (ref 0.0–1.0)
pH: 5.5 (ref 5.0–8.0)

## 2017-10-12 LAB — CBC WITH DIFFERENTIAL/PLATELET
BASOS ABS: 0.1 10*3/uL (ref 0.0–0.1)
Basophils Relative: 1.1 % (ref 0.0–3.0)
EOS PCT: 5.7 % — AB (ref 0.0–5.0)
Eosinophils Absolute: 0.4 10*3/uL (ref 0.0–0.7)
HCT: 37.9 % (ref 36.0–46.0)
HEMOGLOBIN: 12.7 g/dL (ref 12.0–15.0)
Lymphocytes Relative: 30.8 % (ref 12.0–46.0)
Lymphs Abs: 2 10*3/uL (ref 0.7–4.0)
MCHC: 33.5 g/dL (ref 30.0–36.0)
MCV: 83.5 fl (ref 78.0–100.0)
MONO ABS: 0.4 10*3/uL (ref 0.1–1.0)
MONOS PCT: 6.6 % (ref 3.0–12.0)
NEUTROS PCT: 55.8 % (ref 43.0–77.0)
Neutro Abs: 3.6 10*3/uL (ref 1.4–7.7)
Platelets: 307 10*3/uL (ref 150.0–400.0)
RBC: 4.54 Mil/uL (ref 3.87–5.11)
RDW: 14 % (ref 11.5–15.5)
WBC: 6.5 10*3/uL (ref 4.0–10.5)

## 2017-10-12 LAB — HEPATIC FUNCTION PANEL
ALT: 23 U/L (ref 0–35)
AST: 21 U/L (ref 0–37)
Albumin: 4 g/dL (ref 3.5–5.2)
Alkaline Phosphatase: 78 U/L (ref 39–117)
BILIRUBIN DIRECT: 0.1 mg/dL (ref 0.0–0.3)
BILIRUBIN TOTAL: 0.4 mg/dL (ref 0.2–1.2)
Total Protein: 7.6 g/dL (ref 6.0–8.3)

## 2017-10-12 LAB — BASIC METABOLIC PANEL
BUN: 20 mg/dL (ref 6–23)
CALCIUM: 9.9 mg/dL (ref 8.4–10.5)
CO2: 29 meq/L (ref 19–32)
Chloride: 103 mEq/L (ref 96–112)
Creatinine, Ser: 0.86 mg/dL (ref 0.40–1.20)
GFR: 69.85 mL/min (ref >60.00)
GLUCOSE: 137 mg/dL — AB (ref 70–99)
Potassium: 4.8 mEq/L (ref 3.5–5.1)
Sodium: 139 mEq/L (ref 135–145)

## 2017-10-12 LAB — LIPID PANEL
Cholesterol: 269 mg/dL — ABNORMAL HIGH (ref 0–200)
HDL: 54.7 mg/dL (ref >39.00)
LDL Cholesterol: 186 mg/dL — ABNORMAL HIGH (ref 0–99)
NONHDL: 213.91
Total CHOL/HDL Ratio: 5
Triglycerides: 140 mg/dL (ref 0.0–149.0)
VLDL: 28 mg/dL (ref 0.0–40.0)

## 2017-10-12 LAB — MICROALBUMIN / CREATININE URINE RATIO
CREATININE, U: 51.4 mg/dL
MICROALB/CREAT RATIO: 10.1 mg/g (ref 0.0–30.0)
Microalb, Ur: 5.2 mg/dL — ABNORMAL HIGH (ref 0.0–1.9)

## 2017-10-12 LAB — TSH: TSH: 4.37 u[IU]/mL (ref 0.35–4.50)

## 2017-10-12 MED ORDER — ALPRAZOLAM 1 MG PO TABS: 1.0000 mg | ORAL_TABLET | Freq: Two times a day (BID) | ORAL | 5 refills | Status: DC | PRN

## 2017-10-12 MED ORDER — ZOSTER VAC RECOMB ADJUVANTED 50 MCG/0.5ML IM SUSR: 0.5000 mL | Freq: Once | INTRAMUSCULAR | 1 refills | Status: AC

## 2017-10-12 NOTE — Patient Instructions (Addendum)
Warm compress Arnica to the leg hematoma Health Maintenance for Postmenopausal Women Menopause is a normal process in which your reproductive ability comes to an end. This process happens gradually over a span of months to years, usually between the ages of 67 and 67. Menopause is complete when you have missed 12 consecutive menstrual periods. It is important to talk with your health care provider about some of the most common conditions that affect postmenopausal women, such as heart disease, cancer, and bone loss (osteoporosis). Adopting a healthy lifestyle and getting preventive care can help to promote your health and wellness. Those actions can also lower your chances of developing some of these common conditions. What should I know about menopause? During menopause, you may experience a number of symptoms, such as:  Moderate-to-severe hot flashes.  Night sweats.  Decrease in sex drive.  Mood swings.  Headaches.  Tiredness.  Irritability.  Memory problems.  Insomnia.  Choosing to treat or not to treat menopausal changes is an individual decision that you make with your health care provider. What should I know about hormone replacement therapy and supplements? Hormone therapy products are effective for treating symptoms that are associated with menopause, such as hot flashes and night sweats. Hormone replacement carries certain risks, especially as you become older. If you are thinking about using estrogen or estrogen with progestin treatments, discuss the benefits and risks with your health care provider. What should I know about heart disease and stroke? Heart disease, heart attack, and stroke become more likely as you age. This may be due, in part, to the hormonal changes that your body experiences during menopause. These can affect how your body processes dietary fats, triglycerides, and cholesterol. Heart attack and stroke are both medical emergencies. There are many things  that you can do to help prevent heart disease and stroke:  Have your blood pressure checked at least every 1-2 years. High blood pressure causes heart disease and increases the risk of stroke.  If you are 67-67 years old, ask your health care provider if you should take aspirin to prevent a heart attack or a stroke.  Do not use any tobacco products, including cigarettes, chewing tobacco, or electronic cigarettes. If you need help quitting, ask your health care provider.  It is important to eat a healthy diet and maintain a healthy weight. ? Be sure to include plenty of vegetables, fruits, low-fat dairy products, and lean protein. ? Avoid eating foods that are high in solid fats, added sugars, or salt (sodium).  Get regular exercise. This is one of the most important things that you can do for your health. ? Try to exercise for at least 150 minutes each week. The type of exercise that you do should increase your heart rate and make you sweat. This is known as moderate-intensity exercise. ? Try to do strengthening exercises at least twice each week. Do these in addition to the moderate-intensity exercise.  Know your numbers.Ask your health care provider to check your cholesterol and your blood glucose. Continue to have your blood tested as directed by your health care provider.  What should I know about cancer screening? There are several types of cancer. Take the following steps to reduce your risk and to catch any cancer development as early as possible. Breast Cancer  Practice breast self-awareness. ? This means understanding how your breasts normally appear and feel. ? It also means doing regular breast self-exams. Let your health care provider know about any changes, no matter  how small.  If you are 67 or older, have a clinician do a breast exam (clinical breast exam or CBE) every year. Depending on your age, family history, and medical history, it may be recommended that you also have  a yearly breast X-ray (mammogram).  If you have a family history of breast cancer, talk with your health care provider about genetic screening.  If you are at high risk for breast cancer, talk with your health care provider about having an MRI and a mammogram every year.  Breast cancer (BRCA) gene test is recommended for women who have family members with BRCA-related cancers. Results of the assessment will determine the need for genetic counseling and BRCA1 and for BRCA2 testing. BRCA-related cancers include these types: ? Breast. This occurs in males or females. ? Ovarian. ? Tubal. This may also be called fallopian tube cancer. ? Cancer of the abdominal or pelvic lining (peritoneal cancer). ? Prostate. ? Pancreatic.  Cervical, Uterine, and Ovarian Cancer Your health care provider may recommend that you be screened regularly for cancer of the pelvic organs. These include your ovaries, uterus, and vagina. This screening involves a pelvic exam, which includes checking for microscopic changes to the surface of your cervix (Pap test).  For women ages 21-65, health care providers may recommend a pelvic exam and a Pap test every three years. For women ages 22-65, they may recommend the Pap test and pelvic exam, combined with testing for human papilloma virus (HPV), every five years. Some types of HPV increase your risk of cervical cancer. Testing for HPV may also be done on women of any age who have unclear Pap test results.  Other health care providers may not recommend any screening for nonpregnant women who are considered low risk for pelvic cancer and have no symptoms. Ask your health care provider if a screening pelvic exam is right for you.  If you have had past treatment for cervical cancer or a condition that could lead to cancer, you need Pap tests and screening for cancer for at least 20 years after your treatment. If Pap tests have been discontinued for you, your risk factors (such as  having a new sexual partner) need to be reassessed to determine if you should start having screenings again. Some women have medical problems that increase the chance of getting cervical cancer. In these cases, your health care provider may recommend that you have screening and Pap tests more often.  If you have a family history of uterine cancer or ovarian cancer, talk with your health care provider about genetic screening.  If you have vaginal bleeding after reaching menopause, tell your health care provider.  There are currently no reliable tests available to screen for ovarian cancer.  Lung Cancer Lung cancer screening is recommended for adults 21-43 years old who are at high risk for lung cancer because of a history of smoking. A yearly low-dose CT scan of the lungs is recommended if you:  Currently smoke.  Have a history of at least 30 pack-years of smoking and you currently smoke or have quit within the past 15 years. A pack-year is smoking an average of one pack of cigarettes per day for one year.  Yearly screening should:  Continue until it has been 15 years since you quit.  Stop if you develop a health problem that would prevent you from having lung cancer treatment.  Colorectal Cancer  This type of cancer can be detected and can often be prevented.  Routine colorectal cancer screening usually begins at age 64 and continues through age 42.  If you have risk factors for colon cancer, your health care provider may recommend that you be screened at an earlier age.  If you have a family history of colorectal cancer, talk with your health care provider about genetic screening.  Your health care provider may also recommend using home test kits to check for hidden blood in your stool.  A small camera at the end of a tube can be used to examine your colon directly (sigmoidoscopy or colonoscopy). This is done to check for the earliest forms of colorectal cancer.  Direct  examination of the colon should be repeated every 5-10 years until age 57. However, if early forms of precancerous polyps or small growths are found or if you have a family history or genetic risk for colorectal cancer, you may need to be screened more often.  Skin Cancer  Check your skin from head to toe regularly.  Monitor any moles. Be sure to tell your health care provider: ? About any new moles or changes in moles, especially if there is a change in a mole's shape or color. ? If you have a mole that is larger than the size of a pencil eraser.  If any of your family members has a history of skin cancer, especially at a young age, talk with your health care provider about genetic screening.  Always use sunscreen. Apply sunscreen liberally and repeatedly throughout the day.  Whenever you are outside, protect yourself by wearing long sleeves, pants, a wide-brimmed hat, and sunglasses.  What should I know about osteoporosis? Osteoporosis is a condition in which bone destruction happens more quickly than new bone creation. After menopause, you may be at an increased risk for osteoporosis. To help prevent osteoporosis or the bone fractures that can happen because of osteoporosis, the following is recommended:  If you are 9-75 years old, get at least 1,000 mg of calcium and at least 600 mg of vitamin D per day.  If you are older than age 67 but younger than age 67, get at least 1,200 mg of calcium and at least 600 mg of vitamin D per day.  If you are older than age 13, get at least 1,200 mg of calcium and at least 800 mg of vitamin D per day.  Smoking and excessive alcohol intake increase the risk of osteoporosis. Eat foods that are rich in calcium and vitamin D, and do weight-bearing exercises several times each week as directed by your health care provider. What should I know about how menopause affects my mental health? Depression may occur at any age, but it is more common as you become  older. Common symptoms of depression include:  Low or sad mood.  Changes in sleep patterns.  Changes in appetite or eating patterns.  Feeling an overall lack of motivation or enjoyment of activities that you previously enjoyed.  Frequent crying spells.  Talk with your health care provider if you think that you are experiencing depression. What should I know about immunizations? It is important that you get and maintain your immunizations. These include:  Tetanus, diphtheria, and pertussis (Tdap) booster vaccine.  Influenza every year before the flu season begins.  Pneumonia vaccine.  Shingles vaccine.  Your health care provider may also recommend other immunizations. This information is not intended to replace advice given to you by your health care provider. Make sure you discuss any questions you have with  your health care provider. Document Released: 03/31/2005 Document Revised: 08/27/2015 Document Reviewed: 11/10/2014 Elsevier Interactive Patient Education  2018 [employer].

## 2017-10-12 NOTE — Assessment & Plan Note (Signed)
On insulin Labs

## 2017-10-12 NOTE — Assessment & Plan Note (Signed)
Here for medicare wellness/physical  Diet: heart healthy  Physical activity: not sedentary  Depression/mood screen: negative  Hearing: intact to whispered voice  Visual acuity: grossly normal w/glasses, performs annual eye exam  ADLs: capable  Fall risk: low to none  Home safety: good  Cognitive evaluation: intact to orientation, naming, recall and repetition  EOL planning: adv directives, full code/ I agree  I have personally reviewed and have noted  1. The patient's medical, surgical and social history  2. Their use of alcohol, tobacco or illicit drugs  3. Their current medications and supplements  4. The patient's functional ability including ADL's, fall risks, home safety risks and hearing or visual impairment.  5. Diet and physical activities  6. Evidence for depression or mood disorders 7. The roster of all physicians providing medical care to patient - is listed in the Snapshot section of the chart and reviewed today.    Today patient counseled on age appropriate routine health concerns for screening and prevention, each reviewed and up to date or declined. Immunizations reviewed and up to date or declined. Labs ordered and reviewed. Risk factors for depression reviewed and negative. Hearing function and visual acuity are intact. ADLs screened and addressed as needed. Functional ability and level of safety reviewed and appropriate. Education, counseling and referrals performed based on assessed risks today. Patient provided with a copy of personalized plan for preventive services.  Shingrix Rx Ophth q 12 mo, PAP q 12 mo Colon 2017

## 2017-10-12 NOTE — Assessment & Plan Note (Signed)
L post forearm sq nodule ?etiol Derm ref

## 2017-10-12 NOTE — Assessment & Plan Note (Addendum)
x2 wks Warm compress Arnica to the leg hematoma
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
## 2017-10-12 NOTE — Assessment & Plan Note (Signed)
Labs

## 2017-10-19 ENCOUNTER — Encounter: Payer: Self-pay | Admitting: Internal Medicine

## 2017-10-24 ENCOUNTER — Telehealth: Payer: Self-pay | Admitting: Internal Medicine

## 2017-10-24 NOTE — Telephone Encounter (Signed)
Copied from [place] [phone]. Topic: Inquiry >> Oct 24, 2017  5:10 PM Margot Ables wrote: Reason for CRM: pt called to ask about the most recent lab results with protein and blood in urine. Pt scheduled appt for 11/13/17 as the first available OV w/Dr. Alain Marion but is hoping a nurse may be able to explain the results to her and she may not need appt.

## 2017-10-25 NOTE — Telephone Encounter (Signed)
Pt informed of results.

## 2017-10-26 ENCOUNTER — Ambulatory Visit (INDEPENDENT_AMBULATORY_CARE_PROVIDER_SITE_OTHER): Payer: Medicare Other | Admitting: Physician Assistant

## 2017-10-26 VITALS — BP 118/81 | HR 75 | Temp 97.9°F | Resp 15 | Wt 116.0 lb

## 2017-10-26 DIAGNOSIS — S0501XA Injury of conjunctiva and corneal abrasion without foreign body, right eye, initial encounter: Secondary | ICD-10-CM

## 2017-10-26 DIAGNOSIS — Z681 Body mass index (BMI) 19 or less, adult: Secondary | ICD-10-CM

## 2017-10-26 DIAGNOSIS — L309 Dermatitis, unspecified: Secondary | ICD-10-CM

## 2017-10-26 MED ORDER — ERYTHROMYCIN 5 MG/GM OP OINT
1.0000 | TOPICAL_OINTMENT | Freq: Three times a day (TID) | OPHTHALMIC | 0 refills | Status: DC
Start: 2017-10-26 — End: 2018-07-06

## 2017-10-26 NOTE — Progress Notes (Signed)
Chief Complaint   Patient presents with   . Eye Pain     pt c/o RT eye redness, pain since this morning   . Derm Problem     pt c/o itchy rash on bilateral forearms since last night. pt had MRI yesterday with contrast       Subjective:   Paula Bowman is a 67 year old female who presents on 10/26/2017 for the following concerns:     Eye pain: Woke up with R eye irritation this AM. Having issues with allergic conjunctivitis all summer, and R side has been worse than left. This morning she suddenly developed sensation of glass in her R eye after showering. Pain was intense for about 45-60 min and then subsided, but redness and slightly blurred vision persists.      Rash: had MRI done yesterday with IV contrast, has gotten this in past without any allergy, but now having rash on arms. Notes she was also out working in her yard last night, picking green-beans.     I reviewed the patient's recorded medical history and confirmed the medications, problem list, allergies, past medical history and social history.    Review of Systems   Constitutional: Negative for chills and fever.   HENT: Negative for sore throat.    Eyes: Positive for pain. Negative for photophobia.   Respiratory: Negative for shortness of breath.    Gastrointestinal: Negative for nausea and vomiting.   Skin: Positive for itching and rash.   Endo/Heme/Allergies: Positive for environmental allergies.         Objective:   Vitals: BP 118/81   Pulse 75   Temp 97.9 F (36.6 C) (Temporal)   Resp 15   Wt 116 lb (52.6 kg) Comment: per pt report  SpO2 98%   BMI 18.16 kg/m   Physical Exam   Constitutional: She is oriented to person, place, and time and well-developed, well-nourished, and in no distress.   HENT:   Head: Normocephalic and atraumatic.   Eyes: Pupils are equal, round, and reactive to light. EOM are normal. Lids are everted and swept, no foreign bodies found. Right eye exhibits chemosis and discharge. No foreign body present in the right eye.  Right conjunctiva is injected. Left conjunctiva is injected.   Bilateral conjunctival redness, right eye much more red than left, with conjunctival edema, particularly inside of lower lid.     Wood's Lamp exam:  1 drop of proparacaine gtt instilled to right eye for anesthesia. Fluorescein dye applied to surface of eye, and eye was examined with Wood's lamp. No corneal lesion or foreign body observed. Increased uptake at lateral corner of right conjunctiva      Pulmonary/Chest: Effort normal.   Neurological: She is alert and oriented to person, place, and time.   Skin: Rash noted. Rash is urticarial.        Psychiatric: Memory and affect normal.         Assessment and Plan:   Abrasion of right conjunctiva, initial encounter  Suspect patient scratched eye related to ongoing allergic conjunctivitis. No evidence of retained FB or corneal injury.   See AVS for detailed patient instructions.     - erythromycin 5 MG/GM Ophthalmic Ointment; Apply 1 application ribbon to right EYE 3 times a day.  Dispense: 3.5 g; Refill: 0    Dermatitis  Mild raised rash on bilateral forearms, R>>L. Would suspect MRI contrast allergy to cause more generalized/centralized rash, with systemic symptoms, so more likely arm rash related  to localized exposure in her garden after the MRI was done.   Patient to try hydrocortisone and cool compress.       Myrtha Mantis, PA-C  [facility]  [address]  [place] [address]  [phone]

## 2017-10-26 NOTE — Patient Instructions (Addendum)
It was a pleasure to see you in clinic today. Your Medical Assistant was: Karsten Fells can schedule an appointment to see Paula Bowman by calling [phone] or via eCare.     If labs were ordered today the results are expected to be available via eCare 5 days later. Otherwise, result letters are mailed 7-10 days after your tests are completed. If your physician needs to change your care based on your results, you will receive a phone call to notify you. If you haven't heard from him/her and it has been more than 10 days please give Paula Bowman a call.     Thank you for choosing [facility].     1) Apply the antibiotic ointment 3 times daily for 1 week  2) Cool compress to eye 3-4 times daily to reduce swelling and clean the eye  3) If not improving in the next week, follow up with an eye clinic.   4) If severe pain returns, or worsening vision changes-- go to ER    For your arms: I don't think this is a contrast allergy since it's just on your arms- more likely irritation from a plant. Try hydrocortisone 3-4 times daily until cone. Calamine and moisturizers will help too.             Patient Education     Corneal Abrasion    You have received a scratch or scrape (abrasion) to your cornea. The cornea is the clear part in the front of the eye. This sensitive area is very painful when injured. You may make tears frequently, and your vision may be blurry until the injury heals. You may be sensitive to light.  This part of the body heals quickly. You can expect the pain to go away within 24 to 48 hours. If the abrasion is large or deep, your doctor may apply an eye patch, although this is not always done. An antibiotic ointment or eye drops may also be used to prevent infection.  Numbing drops may be used to relieve the pain temporarily so that your eyes can be examined. But these drops can't be prescribed for home use because that would prevent healing and lead to more serious problems. Also, if you  can't feel your eye, there is a chance of accidentally injuring it further without knowing it.  Home care   A cold pack may be applied over the eye (or eye patch) for 20 minutes at a time, to reduce pain. To make a cold pack, put ice cubes in a plastic bag that seals at the top. Wrap the bag in a clean, thin towel or cloth.   You may use acetaminophen or ibuprofen to control pain, unless another pain medicine was prescribed. Note: If you have chronic liver or kidney disease or have ever had a stomach ulcer or GI bleeding, talk with your doctor before using these medicines.   Rest your eyes and don't read until symptoms are gone.   If you use contact lenses, don't wear them until all symptoms are gone.   If your vision is affected by the corneal abrasion or if an eye patch was applied, don't drive a motor vehicle or operate machinery until all symptoms are gone. You may have trouble judging distances using only one eye.   If your eyes are sensitive to light, try wearing sunglasses, or stay indoors until symptoms go away.  Follow-up care  Follow up with your healthcare provider, or as advised.   If no patch was put on your eye and the pain continues for more than 48 hours, you should have another exam. Contact your healthcare provider to arrange this.   If your eye was patched and you were asked to remove the patch yourself, see your healthcare provider. Contact your healthcare provider if you still have pain after the patch is removed.   If you were given a return appointment for patch removal and re-examination, be sure to keep the appointment. Leaving the patch in place longer than advised could be harmful.  When to seek medical advice  Call your healthcare provider right away if any of these occur.   Increasing eye pain or pain that does not improve after 24 hours   Discharge from the eye   Increasing redness of the eye or swelling of the eyelids   Worsening vision   Symptoms get worse after the  abrasion has healed  Date Last Reviewed: 08/21/2015   2000-2018 [employer]. [address] [place] [address]. All rights reserved. This information is not intended as a substitute for professional medical care. Always follow your healthcare professional's instructions.

## 2017-10-26 NOTE — Progress Notes (Signed)
Pt roomed, medications, and allergies verified and updated by August Luz, MA-C  August Luz, [place], 10/26/2017 2:46 PM

## 2017-10-29 DIAGNOSIS — L650 Telogen effluvium: Secondary | ICD-10-CM | POA: Diagnosis not present

## 2017-10-29 DIAGNOSIS — D485 Neoplasm of uncertain behavior of skin: Secondary | ICD-10-CM | POA: Diagnosis not present

## 2017-11-09 DIAGNOSIS — M79661 Pain in right lower leg: Secondary | ICD-10-CM | POA: Diagnosis not present

## 2017-11-09 DIAGNOSIS — Z794 Long term (current) use of insulin: Secondary | ICD-10-CM | POA: Diagnosis not present

## 2017-11-09 DIAGNOSIS — S8011XA Contusion of right lower leg, initial encounter: Secondary | ICD-10-CM | POA: Diagnosis not present

## 2017-11-09 DIAGNOSIS — G8911 Acute pain due to trauma: Secondary | ICD-10-CM | POA: Diagnosis not present

## 2017-11-09 DIAGNOSIS — E119 Type 2 diabetes mellitus without complications: Secondary | ICD-10-CM | POA: Diagnosis not present

## 2017-11-09 DIAGNOSIS — M79604 Pain in right leg: Secondary | ICD-10-CM | POA: Diagnosis not present

## 2017-11-09 DIAGNOSIS — Z880 Allergy status to penicillin: Secondary | ICD-10-CM | POA: Diagnosis not present

## 2017-11-13 ENCOUNTER — Ambulatory Visit: Payer: Medicare Other | Admitting: Internal Medicine

## 2017-11-15 DIAGNOSIS — E663 Overweight: Secondary | ICD-10-CM | POA: Diagnosis not present

## 2017-11-15 DIAGNOSIS — E119 Type 2 diabetes mellitus without complications: Secondary | ICD-10-CM | POA: Diagnosis not present

## 2017-11-15 DIAGNOSIS — Z794 Long term (current) use of insulin: Secondary | ICD-10-CM | POA: Diagnosis not present

## 2017-11-21 DIAGNOSIS — E118 Type 2 diabetes mellitus with unspecified complications: Secondary | ICD-10-CM | POA: Diagnosis not present

## 2017-11-30 DIAGNOSIS — L309 Dermatitis, unspecified: Secondary | ICD-10-CM | POA: Diagnosis not present

## 2017-12-24 DIAGNOSIS — Z23 Encounter for immunization: Secondary | ICD-10-CM | POA: Diagnosis not present

## 2018-01-12 ENCOUNTER — Other Ambulatory Visit: Payer: Self-pay

## 2018-01-24 ENCOUNTER — Ambulatory Visit (INDEPENDENT_AMBULATORY_CARE_PROVIDER_SITE_OTHER): Payer: Medicare Other | Admitting: Family Practice

## 2018-01-24 VITALS — BP 116/72 | HR 67 | Temp 97.9°F | Resp 15

## 2018-01-24 DIAGNOSIS — R21 Rash and other nonspecific skin eruption: Secondary | ICD-10-CM

## 2018-01-24 DIAGNOSIS — L03032 Cellulitis of left toe: Secondary | ICD-10-CM

## 2018-01-24 MED ORDER — TRIAMCINOLONE ACETONIDE 0.5 % EX OINT
TOPICAL_OINTMENT | CUTANEOUS | 0 refills | Status: DC
Start: 2018-01-24 — End: 2022-06-02

## 2018-01-24 MED ORDER — ACYCLOVIR 800 MG OR TABS
800.0000 mg | ORAL_TABLET | Freq: Every day | ORAL | 0 refills | Status: AC
Start: 2018-01-24 — End: 2018-01-31

## 2018-01-24 NOTE — Patient Instructions (Signed)
It was a pleasure to see you in clinic today. Your Medical Assistant was: Zaiah Eckerson                You can schedule an appointment to see us by calling [phone] or via eCare.     If labs were ordered today the results are expected to be available via eCare 5 days later. Otherwise, result letters are mailed 7-10 days after your tests are completed. If your physician needs to change your care based on your results, you will receive a phone call to notify you. If you haven't heard from him/her and it has been more than 10 days please give us a call.     Thank you for choosing [facility].

## 2018-01-24 NOTE — Progress Notes (Signed)
Pt roomed, medications, and allergies verified and updated by August Luz, MA-C  August Luz, [place], 01/24/2018 1:34 PM

## 2018-01-24 NOTE — Progress Notes (Addendum)
Patient Referred By: No ref. provider found  Patient's PCP: Leary Roca, MD     Subjective:  Patient is a 67 year old female, here to discuss Derm Problem (pt c/o rash on LT shoulder for approx 1 wk. she also notes "a boil" on her LT toe)    The following portions of the patient's history were reviewed with the patient and updated as appropriate: problem list, current medications, allergies and past medical history.    HPI  Paula Bowman is a 67 year old female C/o rash on left shoulder x 1 week. Prior to that there was discomfort in area and then rash appeared. Calamine helps with itching, cbd cream irritated it  Hot water makes it worse  Doesn't hurt. Itchy  Has not had shingles  Had chickenpox  No new products/new medications  Has eczema  No one around her with rash  No recent travel  She's retired Catering manager  No fevers/chills    Here to discuss toe problems . Has been seen here for it in past and dx with paronychia and given clindamycin solution and bactrim. clinda solution hasn't helped. Not sure about bactrim. Has had this for a long time. Was seen here initially for it 11/2016. Goes away and comes back, comes back when shes in boots. It's not itchy. It does hurt if she hits it wrong. Thinks she used something in past that helped it but not sure what it was    Review of Systems   Constitutional: Negative for chills, diaphoresis, fatigue and fever.   Skin: Positive for rash.   All other systems reviewed and are negative.        Objective:  BP 116/72   Pulse 67   Temp 97.9 F (36.6 C) (Temporal)   Resp 15   SpO2 99%     Physical Exam  Vitals signs and nursing note reviewed.   Constitutional:       Appearance: Normal appearance.   Musculoskeletal:      Comments: Left toes 2nd/3rd at edge of nail bed there is erythema, cracking of skin   Skin:     Comments: There are erythematous papular lesions on back of left shoulder, scattered. Not real vesicles   Neurological:      Mental  Status: She is alert.          Assessment and Plan:  1. Rash  Dermatitis vs shingles  X 1 week  Pt would like to have rx for acyclovir on hand b/c going on vacation on a cruise and if it gets worse and vzv is positive will take it. rx ordered  - VZV PCR Quantitative, Swab  Cont calamine lotion prn itching, can also use triam ointment prn itching  F/u if rash not improving in the next 1-2 weeks    2. 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  Discussed medication dosage, usage, goals of therapy, and side effects.

## 2018-01-25 LAB — VZV QUANT PCR FOR SWABS: VZV Quant Result: NOT DETECTED {copies}/mL

## 2018-01-25 NOTE — Result Encounter Note (Signed)
Your lab test does not suggest the rash is caused by shingles, so I would not recommend starting the anti-viral medication. Instead, try the calamine lotion and/or triamcinolone, and follow up if not resolved within 2 weeks.   Myrtha Mantis, PA-C

## 2018-03-20 DIAGNOSIS — E119 Type 2 diabetes mellitus without complications: Secondary | ICD-10-CM | POA: Diagnosis not present

## 2018-03-20 DIAGNOSIS — Z794 Long term (current) use of insulin: Secondary | ICD-10-CM | POA: Diagnosis not present

## 2018-05-13 ENCOUNTER — Telehealth: Payer: Self-pay | Admitting: Internal Medicine

## 2018-05-13 NOTE — Telephone Encounter (Signed)
There is no need to take an abx, unless fever, rash, joint swelling etc OV if problems Thx

## 2018-05-13 NOTE — Telephone Encounter (Signed)
Please advise 

## 2018-05-13 NOTE — Telephone Encounter (Unsigned)
Copied from [place] [id number]. Topic: Quick Communication - Rx Refill/Question >> May 13, 2018 10:48 AM Alanda Slim E wrote: Medication: ALPRAZolam Duanne Moron) 1 MG tablet  Pt also was bit by a tick on Saturday and would like the antibiotic called in that is usually called in when this has happened in the past/ Pt doesn't know the name of the antibiotic/ please advise   Has the patient contacted their pharmacy? {no   Preferred Pharmacy (with phone number or street name): [id number] [place], [place] - [phone] (Phone) [phone] (Fax)    Agent: Please be advised that RX refills may take up to 3 business days. We ask that you follow-up with your pharmacy.

## 2018-05-14 ENCOUNTER — Other Ambulatory Visit: Payer: Self-pay

## 2018-05-14 NOTE — Telephone Encounter (Signed)
Pt notified and will call back if these symptoms develop

## 2018-05-15 NOTE — Telephone Encounter (Signed)
Patient calling about xanax script not yet called in. Advised should arrive Thursday at latest.

## 2018-05-15 NOTE — Telephone Encounter (Signed)
RX sent to Dr. Alain Marion yesterday

## 2018-05-16 MED ORDER — ALPRAZOLAM 1 MG PO TABS: 1.0000 mg | ORAL_TABLET | Freq: Two times a day (BID) | ORAL | 5 refills | Status: DC | PRN

## 2018-05-23 ENCOUNTER — Ambulatory Visit (HOSPITAL_BASED_OUTPATIENT_CLINIC_OR_DEPARTMENT_OTHER): Payer: Medicare PPO

## 2018-06-19 ENCOUNTER — Telehealth (INDEPENDENT_AMBULATORY_CARE_PROVIDER_SITE_OTHER): Payer: Self-pay | Admitting: Internal Medicine

## 2018-06-19 NOTE — Telephone Encounter (Signed)
Phone call to pt , no answer .     She is due for AWV visit. I left her detail mag asking her to call back at clinic and schedule AWV tele visit . CCR , please assist her when she calls back. Thank you .

## 2018-06-24 NOTE — Telephone Encounter (Signed)
2nd attempt, no answer, LM for pt to call back.

## 2018-06-26 NOTE — Telephone Encounter (Signed)
E care msg was send to pt on 06/10/2018. I mailed a letter also.

## 2018-07-05 ENCOUNTER — Encounter (INDEPENDENT_AMBULATORY_CARE_PROVIDER_SITE_OTHER): Payer: Self-pay

## 2018-07-06 ENCOUNTER — Telehealth (INDEPENDENT_AMBULATORY_CARE_PROVIDER_SITE_OTHER): Payer: Medicare PPO | Admitting: Internal Medicine

## 2018-07-06 VITALS — BP 124/73 | HR 61 | Temp 97.2°F | Resp 12 | Wt 115.0 lb

## 2018-07-06 DIAGNOSIS — I671 Cerebral aneurysm, nonruptured: Secondary | ICD-10-CM

## 2018-07-06 DIAGNOSIS — M2550 Pain in unspecified joint: Secondary | ICD-10-CM

## 2018-07-06 DIAGNOSIS — Z1329 Encounter for screening for other suspected endocrine disorder: Secondary | ICD-10-CM

## 2018-07-06 DIAGNOSIS — Z136 Encounter for screening for cardiovascular disorders: Secondary | ICD-10-CM

## 2018-07-06 DIAGNOSIS — C50919 Malignant neoplasm of unspecified site of unspecified female breast: Secondary | ICD-10-CM | POA: Insufficient documentation

## 2018-07-06 DIAGNOSIS — K52839 Microscopic colitis, unspecified: Secondary | ICD-10-CM

## 2018-07-06 DIAGNOSIS — Z0001 Encounter for general adult medical examination with abnormal findings: Secondary | ICD-10-CM

## 2018-07-06 DIAGNOSIS — C4490 Unspecified malignant neoplasm of skin, unspecified: Secondary | ICD-10-CM | POA: Insufficient documentation

## 2018-07-06 DIAGNOSIS — Z13820 Encounter for screening for osteoporosis: Secondary | ICD-10-CM

## 2018-07-06 HISTORY — DX: Cerebral aneurysm, nonruptured: I67.1

## 2018-07-06 LAB — COMPREHENSIVE METABOLIC PANEL
ALT (GPT): 14 U/L (ref 7–33)
AST (GOT): 18 U/L (ref 9–38)
Albumin: 4.2 g/dL (ref 3.5–5.2)
Alkaline Phosphatase (Total): 52 U/L (ref 38–172)
Anion Gap: 4 (ref 4–12)
Bilirubin (Total): 0.5 mg/dL (ref 0.2–1.3)
Calcium: 9.6 mg/dL (ref 8.9–10.2)
Carbon Dioxide, Total: 31 meq/L (ref 22–32)
Chloride: 104 meq/L (ref 98–108)
Creatinine: 0.68 mg/dL (ref 0.38–1.02)
GFR, Calc, African American: >60 mL/min/{1.73_m2} (ref >59)
GFR, Calc, European American: >60 mL/min/{1.73_m2} (ref >59)
Glucose: 95 mg/dL (ref 62–125)
Potassium: 4.6 meq/L (ref 3.6–5.2)
Protein (Total): 6.9 g/dL (ref 6.0–8.2)
Sodium: 139 meq/L (ref 135–145)
Urea Nitrogen: 25 mg/dL — ABNORMAL HIGH (ref 8–21)

## 2018-07-06 LAB — RHEUMATOID FACTOR: Rheumatoid Factor: <10 [IU]/mL (ref <15)

## 2018-07-06 LAB — TSH WITH REFLEXIVE FREE T4: TSH with Reflexive Free T4: 1.742 u[IU]/mL (ref 0.400–5.000)

## 2018-07-06 LAB — C_REACTIVE PROTEIN: C_Reactive Protein: 2.8 mg/L (ref 0.0–10.0)

## 2018-07-06 LAB — LIPID PANEL
Cholesterol (LDL): 122 mg/dL (ref <130)
Cholesterol/HDL Ratio: 3
HDL Cholesterol: 70 mg/dL (ref >39)
Non-HDL Cholesterol: 137 mg/dL (ref 0–159)
Total Cholesterol: 207 mg/dL — ABNORMAL HIGH (ref <200)
Triglyceride: 73 mg/dL (ref <150)

## 2018-07-06 LAB — CBC, DIFF
% Basophils: 0 %
% Eosinophils: 4 %
% Immature Granulocytes: 0 %
% Lymphocytes: 28 %
% Monocytes: 4 %
% Neutrophils: 64 %
% Nucleated RBC: 0 %
Absolute Eosinophil Count: 0.36 10*3/uL (ref 0.00–0.50)
Absolute Lymphocyte Count: 2.53 10*3/uL (ref 1.00–4.80)
Basophils: 0 10*3/uL (ref 0.00–0.20)
Hematocrit: 39 % (ref 36–45)
Hemoglobin: 12.7 g/dL (ref 11.5–15.5)
Immature Granulocytes: 0 10*3/uL (ref 0.00–0.05)
MCH: 32.2 pg (ref 27.3–33.6)
MCHC: 32.2 g/dL (ref 32.2–36.5)
MCV: 100 fL — ABNORMAL HIGH (ref 81–98)
Monocytes: 0.36 10*3/uL (ref 0.00–0.80)
Neutrophils: 5.77 10*3/uL (ref 1.80–7.00)
Nucleated RBC: 0 10*3/uL
Platelet Count: 199 10*3/uL (ref 150–400)
RBC: 3.94 10*6/uL (ref 3.80–5.00)
RDW-CV: 12.3 % (ref 11.6–14.4)
WBC: 9.02 10*3/uL (ref 4.3–10.0)

## 2018-07-06 LAB — SED RATE: Erythrocyte Sedimentation Rate: 11 mm/h (ref 0–20)

## 2018-07-06 NOTE — Progress Notes (Signed)
ANNUAL WELLNESS VISIT    Paula Bowman is a 68 year old female who presents for an Annual Wellness Visit.   []  Initial   [x]  Subsequent   Patient is here also to establish care today  She does have history of what seems to be depression but at the same time she declined all along to use medication she said she is working on it   She does have some more stresses recently with the COVID-19  She tried SSRIs in the past and she was not able to tolerate them she does not want to try any medication at this time    Her second problem is a problem with skin changes around her nails she was seen by dermatology and different creams are used and none of them more was very helpful  The patient then was referred to podiatry who recommended that she is to be checked for psoriatic arthritis  Patient was unable to get to be seen by rheumatology she had no joint pain and she denied have any joint inflammation like warmness redness or tenderness no inflammatory markers were done in the past    The patient was also diagnosed with microscopic colitis and she is follow-up by gastroenterologist she has chronic diarrhea but no bleeding issues  Patient was also diagnosed with brain aneurysm for which she follow-up with neurology    Patient is due for DEXA scan last 1 done many years ago and showed osteopenia    INFORMATION GATHERING:  The following areas were confirmed with patient/caregiver and/or updated in Epic at this visit (required):    [x]  Past Medical History   [x]  Past Surgical History   [x]  Family History   [x]  Social History    [x]  Current medications and supplements (including vitamins and calcium)   [x]  Allergies    The information below is up to date at the end of this  visit (required):  Patient is taking care of ADL no need for help   Hearing is Good   Fall risk is low   Diet on regular basis   Physically active daily   patient reads and watch diet and exercise on regular basis      The Health Risk Assessment  (HRA) for today's visit (required) was completed (check one):   []   as Patient-Entered Questionnaire via eCare (visible in [place] and in Synopsis)   [x]  as paper HRA form, completed by or with the patient or caregiver (reminder:                    paper form, if completed, must be scanned to Media)   []  in HRA template documentation, completed by staff or provider with patient or  caregiver at visit (for use when HRA was not completed prior to rooming)      List of current providers and suppliers (check all that apply):   []   See Care Team Section   [x]   See EHR Encounters for [facility] Medicine Providers involved in care   []   Other providers and suppliers outside [facility]     Depression screening:  PHQ2 Total Score: 3       EXAM:  BP 124/73   Pulse 61   Temp 97.2 F (36.2 C) (Temporal)   Resp 12   Wt 115 lb (52.2 kg)   SpO2  100%   BMI 18.01 kg/m   GAIT:    [x]  Normal, stable, independent    []  Abnormal (describe):    As assessed by:     [x]  Direct Observation     []  Timed Up and Go     []  Other:  COGNITION:    [x]  Intact    []  Abnormal (describe):     As assessed by:      [x]  Direct Observation     []  Brief Cognitive Screen  Physical Exam  Vitals signs reviewed.   Constitutional:       General: She is not in acute distress.     Appearance: Normal appearance. She is not ill-appearing.   HENT:      Head: Normocephalic and atraumatic.      Nose: No congestion.      Mouth/Throat:      Pharynx: No oropharyngeal exudate.   Eyes:      General: No scleral icterus.        Right eye: No discharge.         Left eye: No discharge.      Conjunctiva/sclera: Conjunctivae normal.   Neck:      Musculoskeletal: Neck supple.   Cardiovascular:      Rate and Rhythm: Normal rate and regular rhythm.      Pulses: Normal pulses.      Heart sounds: No murmur.   Pulmonary:      Effort: Pulmonary effort is normal. No respiratory distress.      Breath sounds: No wheezing or rales.   Chest:       Chest wall: No tenderness.   Abdominal:      General: Abdomen is flat. There is no distension.      Palpations: Abdomen is soft.      Tenderness: There is no abdominal tenderness. There is no right CVA tenderness, left CVA tenderness or guarding.   Musculoskeletal:      Right lower leg: No edema.      Left lower leg: No edema.   Lymphadenopathy:      Cervical: No cervical adenopathy.   Skin:     General: Skin is warm and dry.   Neurological:      General: No focal deficit present.      Mental Status: She is alert and oriented to person, place, and time.   Psychiatric:         Mood and Affect: Mood normal.         Behavior: Behavior normal.          ADVANCE CARE PLANNING (ACP) (optional)  Advance care planning:   [x]  Patient Accepted   []  Patient Declined     Check all that apply:   []  Advance directives are on file in her chart   [x]  Explained & discussed advance directives at this visit   []  Completed advance care planning form(s) at this visit   []  Other Advance Care Planning discussion at this visit (describe):       Time Spent on ACP: []  None       [x]  1-15 minutes      []  16-30 minutes      []  >30 minutes      ASSESSMENT:  Paula Bowman was seen for her Annual Wellness Visit, including identification of risk factors & conditions that may affect her health and function in the future.   1. Encounter for well adult exam with abnormal findings  - Comprehensive  Metabolic Panel  - CBC with Differential    2. Arthralgia, unspecified joint   Cussed with the patient we will check inflammatory marker and follow-up after  - RBC Sedimentation Rate  - CRP, high sensitivity  - Rheumatoid Factor    3. Microscopic colitis, unspecified microscopic colitis type   Continue to follow-up with gastroenterology    4. Brain aneurysm   Continue to follow-up with neurolog    5. Screening for cardiovascular condition    - Lipid Panel    6. Screening for thyroid disorder    - TSH with Reflexive Free T4    7. Screening for  osteoporosis    - DEXA, AXIAL SKELETON; Future      Actions at this visit (all 3 required):   [x]   Establishing or updating a written schedule of screening and prevention  measures recommended and appropriate for Paula Bowman for the next 5-10 years   [x]   Establishing or updating a list of her risk factors and conditions for which  lifestyle or medical interventions are recommended or underway, including  mental health risks and conditions, and including risks/benefits of treatment   [x]   Furnishing personalized health advice and, as appropriate, referrals to health  education or preventive counseling services or programs (such as fall  prevention, tobacco cessation, physical activity, nutrition, weight loss)      COUNSELING:      Here are screening & prevention measures recommended for you:  Health Maintenance   Topic Date Due   . Zoster Vaccine (2 of 3) 07/08/2012   . Depression Screening (PHQ-2)  06/22/2018   . Lipid Disorders Screening  02/03/2018   . Breast Cancer Screening  05/01/2019   . DTaP, Tdap, and Td Vaccines (2 - Td) 11/28/2023   . Colorectal Cancer Screening  09/14/2024   . Osteoporosis Screening  Completed   . Pneumococcal Vaccine: 65+ years  Completed   . Influenza Vaccine  Completed   . Hepatitis C Screening  Addressed   . Hepatitis B Vaccine  Aged Out   . Pneumococcal Vaccine: Pediatrics (0-5 years) and At-Risk Patients (6-64 years)  Aged Out       Based on today's evaluation, I recommend the following ways to improve your health or functioning:   1- WEIGHT   2- Physical activity   3- Social activity   4- Mental activity      You have the following risk factors and/or medical conditions for which there are recommended ways (included in the list) to help you stay as healthy as possible:  1.    Colitis        Please plan to have a Subsequent Annual Wellness Visit in 1 year.

## 2018-07-06 NOTE — Patient Instructions (Signed)
You may be a candidate for the following screening and prevention measures:    Health Maintenance   Topic Date Due   . Zoster Vaccine (2 of 3) 07/08/2012   . Depression Screening (PHQ-2)  06/22/2018   . Lipid Disorders Screening  02/03/2018   . Breast Cancer Screening  05/01/2019   . DTaP, Tdap, and Td Vaccines (2 - Td) 11/28/2023   . Colorectal Cancer Screening  09/14/2024   . Osteoporosis Screening  Completed   . Pneumococcal Vaccine: 65+ years  Completed   . Influenza Vaccine  Completed   . Hepatitis C Screening  Addressed   . Hepatitis B Vaccine  Aged Out   . Pneumococcal Vaccine: Pediatrics (0-5 years) and At-Risk Patients (6-64 years)  Aged Out

## 2018-07-06 NOTE — Progress Notes (Signed)
Patient roomed by Deborra Medina, CMA       Covid - 42 Screening are you having any of the following?    1- Fever   NO  2- New Shortness of Breath   NO  3- New Cough  NO  4- Headache  NO  5- Sore throat  NO  6- Body aches  NO  7- Loss of sense of smell and/or taste  NO    Reason For Visit:  MEDICARE AWV  URINE SAMPLE GIVEN - NO PAIN    Health Maintenance Due:  06/22/2018 Depression Screening (PHQ-2)       Vaccines Due:  07/08/2012 Zoster Vaccine (2 of 3)       Medication Refills:     Future Health Maintenance Due:   02/03/2018 Lipid Disorders Screening     05/01/2019 Breast Cancer Screening     11/28/2023 DTaP, Tdap, and Td Vaccines (2 - Td)     09/14/2024 Colorectal Cancer Screening      Future Visits

## 2018-07-10 ENCOUNTER — Other Ambulatory Visit (INDEPENDENT_AMBULATORY_CARE_PROVIDER_SITE_OTHER): Payer: Self-pay | Admitting: Internal Medicine

## 2018-07-10 DIAGNOSIS — Z13820 Encounter for screening for osteoporosis: Secondary | ICD-10-CM

## 2018-07-12 ENCOUNTER — Encounter (INDEPENDENT_AMBULATORY_CARE_PROVIDER_SITE_OTHER): Payer: Self-pay

## 2018-07-13 ENCOUNTER — Other Ambulatory Visit (INDEPENDENT_AMBULATORY_CARE_PROVIDER_SITE_OTHER): Payer: Self-pay | Admitting: Family Medicine

## 2018-07-13 DIAGNOSIS — R059 Cough, unspecified: Secondary | ICD-10-CM

## 2018-07-16 MED ORDER — FLOVENT HFA 220 MCG/ACT IN AERO
1.0000 | INHALATION_SPRAY | Freq: Two times a day (BID) | RESPIRATORY_TRACT | 1 refills | Status: DC
Start: 2018-07-16 — End: 2021-05-19

## 2018-07-22 ENCOUNTER — Telehealth (INDEPENDENT_AMBULATORY_CARE_PROVIDER_SITE_OTHER): Payer: Medicare PPO | Admitting: Internal Medicine

## 2018-07-22 DIAGNOSIS — H1013 Acute atopic conjunctivitis, bilateral: Secondary | ICD-10-CM

## 2018-07-22 DIAGNOSIS — M2550 Pain in unspecified joint: Secondary | ICD-10-CM

## 2018-07-22 DIAGNOSIS — M8580 Other specified disorders of bone density and structure, unspecified site: Secondary | ICD-10-CM

## 2018-07-22 MED ORDER — OLOPATADINE HCL 0.1 % OP SOLN
1.0000 [drp] | Freq: Two times a day (BID) | OPHTHALMIC | 0 refills | Status: DC
Start: 2018-07-22 — End: 2021-05-19

## 2018-07-22 NOTE — Patient Instructions (Signed)
It was a pleasure to see you in clinic today. Your Medical Assistant was: Montrey Buist, CMA                You can schedule an appointment to see us by calling [phone] or via eCare.     If labs were ordered today the results are expected to be available via eCare 5 days later. Otherwise, result letters are mailed 7-10 days after your tests are completed. If your physician needs to change your care based on your results, you will receive a phone call to notify you. If you haven't heard from him/her and it has been more than 10 days please give us a call.     Please contact your pharmacy about receiving the following vaccines: Shingles    Your next Annual Wellness Visit is Due: If you are not yet set up, please schedule this appointment at your convenience.    Thank you for choosing [facility].

## 2018-07-22 NOTE — Progress Notes (Signed)
Patient Roomed by Susy Frizzle, CMA  Allergies, Medications, Health Maintenance and Smoking history were reviewed.     Reason for Visit:   1. Follow Up: Labs/test results    PHQScore 9 - declined    HEALTH MAINTENANCE:    Health Maintenance Topics with due status: Overdue       Topic Date Due    Zoster Vaccine 07/08/2012     Immunizations: Zoster - declining

## 2018-07-22 NOTE — Progress Notes (Signed)
SUBJECTIVE:     Chief Complain:  Paula Bowman is an 68 year old female here for   Chief Complaint   Patient presents with   . Follow-Up    Distant Site Telemedicine Encounter  This visit was conducted during the COVID-19 Pandemic.   I conducted this encounter from [facility] via secure, live, face-to-face video conference with the patient. Paula Bowman was located at home with self.  Prior to the interview, the risks and benefits of telemedicine were discussed with the patient and verbal consent was obtained.       History of Present Illness:  Paula Bowman is a 68 year old female here today for follow-up to discuss her recent lab test and also her DEXA scan  With regard of her recent blood test it came back all unremarkable including the markers for inflammatory arthritis  Her C-reactive protein and sedimentation rate came back unremarkable and rheumatoid factor came back negative  Patient continues to have the joint problem, she is already referred to be seen by rheumatology she is in the process of making an appointment    DEXA scan came back showing osteopenia of the lumbar spine and the neck of the femur  Patient has no history of fracture  She said today she is was told 20 years ago that she has a problem with that and she was started on calcium and vitamin D supplement and she is regularly exercising    She also has a question about the shingles vaccine she had although shingles vaccine which did not help and she had an episode she did not receive yet the new vaccine Shingrix        HISTORY:    The following portions of the patient's history were reviewed with the patient and updated as appropriate: problem list, current medications, allergies.    Patient Active Problem List   Diagnosis   . Anxiety state   . Other chronic allergic conjunctivitis   . GERD (gastroesophageal reflux disease)   . Eczema   . Allergic rhinitis   . Onychomycosis   . Back pain   . 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 Neck pain   . Need for prophylactic vaccination with combined diphtheria-tetanus-pertussis (DTP) vaccine   . Radiculopathy of cervical region   . Heat exhaustion   . Dry eye   . Malignant neoplasm of breast ([place])   . Malignant neoplasm of skin   . Microscopic colitis   . Brain aneurysm     Outpatient Medications Marked as Taking for the 07/22/18 encounter (Telemedicine) with Stacie Glaze, MD   Medication Sig Dispense Refill   . clindamycin 1 % External Solution      . Colestipol HCl 1 g Oral Tab Take 2 tablets (2 g) by mouth daily. Take other medications 1 hour before or 4 hours after colestipol     . Fluticasone Propionate 50 MCG/ACT Nasal Suspension Spray 2 sprays into each nostril daily. For prevention/control of nasal/sinus congestion 3 Inhaler 3   . fluticasone propionate HFA (Flovent HFA) 220 MCG/ACT inhaler Inhale 1 puff by mouth 2 times a day. 3 Inhaler 1   . Olopatadine HCl 0.1 % Ophthalmic Solution Place 1 drop in each EYE 2 times a day. as needed (the azelastine is not tolerated)     . triamcinolone 0.5 % ointment Use twice a day for 2 weeks 80 g 0     Review of patient's  allergies indicates:  Allergies   Allergen Reactions   . Amoxicillin Rash   . Erythromycin    . Levofloxacin Itching     Redness up arm from IV dose   . Penicillin G Rash   . Penicillins Rash   . Terbinafine Hcl      Loss of sense of taste          OBJECTIVE:   There were no vitals filed for this visit.    Physical Exam   Constitutional: She is oriented to person, place, and time and well-developed, well-nourished, and in no distress. No distress.   HENT:   Head: Normocephalic and atraumatic.   Eyes: Conjunctivae are normal. No scleral icterus.   Pulmonary/Chest: Effort normal. No respiratory distress.   Neurological: She is alert and oriented to person, place, and time.   Psychiatric: Affect and judgment normal.       ASSESSMENT and PLAN:   1. Osteopenia, unspecified location   Discussed with the patient about  osteopenia and osteoporosis , explained the difference also explained what is the T score    Regular exercise.    Vitamin D total 2000 units per day.    Calcium 1000 mg daily .    Check DEXA in 2 years.       2. Arthralgia, unspecified joint   Discussed lab results with the patient   Patient follow-up with rheumatology    3. Allergic conjunctivitis of both eyes   Referral request because of allergic conjunctivitis was given today   - olopatadine 0.1 % ophthalmic solution; Place 1 drop in each EYE 2 times a day. as needed (the azelastine is not tolerated)  Dispense: 1 bottle; Refill: 0    I have spent 25 minutes on this visit today including chart review prior to the visit, face to face time with the patient, and documentation and coordination of care after the visit."       FOLLOW Up:   1 year for a wellness

## 2018-07-23 ENCOUNTER — Encounter (HOSPITAL_BASED_OUTPATIENT_CLINIC_OR_DEPARTMENT_OTHER): Payer: Self-pay | Admitting: Internal Medicine

## 2018-07-23 DIAGNOSIS — Z1231 Encounter for screening mammogram for malignant neoplasm of breast: Secondary | ICD-10-CM

## 2018-07-29 ENCOUNTER — Other Ambulatory Visit: Payer: Self-pay | Admitting: Internal Medicine

## 2018-07-29 ENCOUNTER — Ambulatory Visit: Payer: Medicare PPO | Attending: Internal Medicine

## 2018-07-29 DIAGNOSIS — D0511 Intraductal carcinoma in situ of right breast: Secondary | ICD-10-CM | POA: Insufficient documentation

## 2018-07-29 DIAGNOSIS — Z1231 Encounter for screening mammogram for malignant neoplasm of breast: Secondary | ICD-10-CM

## 2018-07-29 DIAGNOSIS — R921 Mammographic calcification found on diagnostic imaging of breast: Secondary | ICD-10-CM | POA: Insufficient documentation

## 2018-07-31 ENCOUNTER — Ambulatory Visit (INDEPENDENT_AMBULATORY_CARE_PROVIDER_SITE_OTHER): Payer: Medicare PPO | Admitting: Family Medicine

## 2018-07-31 VITALS — BP 114/74 | HR 78 | Temp 97.4°F | Resp 18 | Wt 115.0 lb

## 2018-07-31 DIAGNOSIS — H0100A Unspecified blepharitis right eye, upper and lower eyelids: Secondary | ICD-10-CM

## 2018-07-31 DIAGNOSIS — H0100B Unspecified blepharitis left eye, upper and lower eyelids: Secondary | ICD-10-CM

## 2018-07-31 DIAGNOSIS — H04123 Dry eye syndrome of bilateral lacrimal glands: Secondary | ICD-10-CM

## 2018-07-31 DIAGNOSIS — H00025 Hordeolum internum left lower eyelid: Secondary | ICD-10-CM

## 2018-07-31 MED ORDER — SULFACETAMIDE SODIUM 10 % OP SOLN
OPHTHALMIC | 0 refills | Status: DC
Start: 2018-07-31 — End: 2021-05-19

## 2018-07-31 NOTE — Progress Notes (Signed)
Paula Bowman is a 68 year old female who presents to the [facility] with a Eye Pain (2 day OS, left lower lid redness and swelling, eye will cake shut at rest during night, afebrile no aches or chills no sweats, no enviromental, no imjury, no sensatin of fb.)    I personally reviewed and verified the documented history with the patient and     68 year old female comes in with a 2-day history of left eye discomfort and redness.  She has some chronic issues with the eye including the eye feeling like it stuck shut intermittently over the last several months.  Usually happens at nighttime and is not associated with crusting or discharge.  She states she has no problem with the eye sticking so during the daytime.  She has been told her eye dryness is borderline and she has been using refresh type eyedrops on a daily basis.  She states she did use Restasis in the past but did not notice much improvement over just saline drops.  She states about 2 days ago she noticed an area of tenderness beneath the left lower lid.  She did not feel a palpable bump.  She does not have a foreign body sensation and does not notice blurriness of her vision.  She does not wash her eyes other than washing her face daily.  Denies any fevers or chills.  She states occasionally she feels like there is a hair in her eye.  That happened every now and then usually goes away after a couple of days.  She has never actually seen anything in her eye and actually has gone to the doctor during 1 of the spells with no diagnosis being given.      Past Medical History:   Diagnosis Date   . Esophageal reflux    . Malignant neoplasm of breast (female), unspecified site [facility]) 2008    lumpectomy: Stage 0; Radiation; was ERP; She did not complete Tamoxifen due to side effects x1 yr.    . Microscopic colitis        Patient Active Problem List   Diagnosis   . Anxiety state   . Other chronic allergic conjunctivitis      . GERD (gastroesophageal reflux disease)   . Eczema   . Allergic rhinitis   . Onychomycosis   . Back pain   . 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 Neck pain   . Need for prophylactic vaccination with combined diphtheria-tetanus-pertussis (DTP) vaccine   . Radiculopathy of cervical region   . Heat exhaustion   . Dry eye   . Malignant neoplasm of breast ([place])   . Malignant neoplasm of skin   . Microscopic colitis   . Brain aneurysm       Outpatient Medications Marked as Taking for the 07/31/18 encounter (Office Visit) with Quincy Sheehan, MD   Medication Sig Dispense Refill   . clindamycin 1 % External Solution      . Colestipol HCl 1 g Oral Tab Take 2 tablets (2 g) by mouth daily. Take other medications 1 hour before or 4 hours after colestipol     . Fluticasone Propionate 50 MCG/ACT Nasal Suspension Spray 2 sprays into each nostril daily. For prevention/control of nasal/sinus congestion 3 Inhaler 3   . fluticasone propionate HFA (Flovent HFA) 220 MCG/ACT inhaler Inhale 1 puff by mouth 2 times a day. 3 Inhaler 1   .  olopatadine 0.1 % ophthalmic solution Place 1 drop in each EYE 2 times a day. as needed (the azelastine is not tolerated) 1 bottle 0   . triamcinolone 0.5 % ointment Use twice a day for 2 weeks 80 g 0       Review of patient's allergies indicates:  Allergies   Allergen Reactions   . Amoxicillin Rash   . Erythromycin    . Levofloxacin Itching     Redness up arm from IV dose   . Penicillin G Rash   . Penicillins Rash   . Terbinafine Hcl      Loss of sense of taste       Past Surgical History:   Procedure Laterality Date   . BREAST LUMPECTOMY     . COLONOSCOPY STOMA DX INCLUDING COLLJ [place] SPX  2006    [place] , FU 10 years   . HERNIA REPAIR     . Knee, R knee at age 7, removed floating bone     . TOTAL ABDOMINAL HYSTERECT W/WO RMVL TUBE OVARY      for fibroids   . UNLISTED PROCEDURE FEMUR/KNEE Right 1969   . VAGINAL HYSTERECTOMY UTERUS 250 GM/<  1994    [place] in place        Social History     Tobacco Use   . Smoking status: Former Smoker     Packs/day: 2.00     Years: 15.00     Pack years: 30.00     Types: Cigarettes     Quit date: 01/07/1988     Years since quitting: 30.5   . Smokeless tobacco: Never Used   Substance Use Topics   . Alcohol use: Yes     Alcohol/week: 0.8 standard drinks     Types: 1 Standard drinks or equivalent per week     Comment: 2 glasses of wine daily   . Drug use: No       Parts of this medical record are completed using a dragon dictation system.    Review of Systems   Constitutional: Negative for activity change, chills and fever.   HENT: Negative for congestion and sore throat.    Eyes: Positive for pain and redness. Negative for photophobia and discharge.   Respiratory: Negative for cough.    Neurological: Negative for headaches.       BP 114/74   Pulse 78   Temp 97.4 F (36.3 C) (Temporal)   Resp 18   Wt 115 lb (52.2 kg)   SpO2 100%   BMI 18.01 kg/m   Physical Exam   Constitutional: She is oriented to person, place, and time.   Alert in no acute distress.   HENT:   Head: Normocephalic and atraumatic.   Eyes:   Inspection of the right eye shows mild loss of lashes of the lower lid but no evidence of inflammation of the bulbar or palpebral conjunctiva.    Inspection of the left lid shows some mild blepharitis of the lower lid and hair loss.  There is no active discharge present.  Inspection shows no significant bulbar conjunctivitis.  There is injection redness of the inner lower lid.  There are 2 small punctate meibomian glands that appear to have some low-grade infection in them.  There is no palpable bump and I cannot express any discharge.    There is no swelling of the orbits or soft tissue around the eye.  There is no photophobia.   Pulmonary/Chest: Effort normal.  Neurological: She is alert and oriented to person, place, and time.   Skin: Skin is warm and dry. No erythema.   Psychiatric: She has a normal mood and affect. Her behavior is  normal. Judgment normal.   Nursing note and vitals reviewed.    (H00.025) Hordeolum internum of left lower eyelid  (primary encounter diagnosis)  Plan: sulfacetamide sodium 10 % ophthalmic solution        Chose sulfa eyedrops due to allergy to Levaquin.  (H01.00A,  H01.00B) Blepharitis of upper and lower eyelids of both eyes, unspecified type  Plan: daily cleaning discussed below  (P29.518) Chronic dryness of both eyes  Plan: see below for treatment issues, continue the Refresh type drops    Patient Instructions   Thank you for choosing the Urgent Care at [facility] for your visit today.  Your Provider today was Dr Carloyn Manner  If you receive a survey in the mail please complete this as it helps [place] improve our care to you.    If you have lab tests that have not been completed at the time of your discharge, you will be contacted when they're completed.  This may be done either via eCare or via the phone.  If you had x-rays today you were given a preliminary diagnosis from the provider.  If something of significance difference is seen by the radiologist you will be contacted and those differences will be described or explained.  If you sign up with eCare you will be able to view the x-ray reports and the lab work within a few days of their completion.    If you have questions about your care you can call the [facility] clinic during office hours at [phone], or contact the [place] 24 hour number at [phone].      Please follow-up with your primary care provider in 3-5 days or sooner if you are getting worse. If you are in need of a primary care provider and would like to establish care with [facility], please call [phone].    Your specific instructions are listed below:    Sty or Hordeolum    Use a hot moist compress to the involved lids 1-2 min. daily.  Follow this with scrubbing the lashes with baby shampoo and then washing your face with hot water.  Apply eye drops  as directed after the treatments.    Follow-up if you fail to improve or if you worsen.     Apply the eyedrops as below    Lay down and apply one drop of the antibiotic eye drops in the affected eye four times daily.    Close the eyes gently and rest both eyes in the closed position for 5 min. after application of the drops.  This gives the medicine a chance to absorb into the tissue.  If you are in the upright position the drops will rapidly drain out into the nose.    Wash hands with soap and water frequently.    Try to avoid touching the dropper to the lashes to avoid contaminating the dropper.     Use the drops for about 2 days after the redness has cleared or about 1 week

## 2018-07-31 NOTE — Patient Instructions (Signed)
Thank you for choosing the Urgent Care at [facility] for your visit today.  Your Provider today was Dr Carloyn Manner  If you receive a survey in the mail please complete this as it helps [place] improve our care to you.    If you have lab tests that have not been completed at the time of your discharge, you will be contacted when they're completed.  This may be done either via eCare or via the phone.  If you had x-rays today you were given a preliminary diagnosis from the provider.  If something of significance difference is seen by the radiologist you will be contacted and those differences will be described or explained.  If you sign up with eCare you will be able to view the x-ray reports and the lab work within a few days of their completion.    If you have questions about your care you can call the [facility] clinic during office hours at [phone], or contact the [place] 24 hour number at [phone].      Please follow-up with your primary care provider in 3-5 days or sooner if you are getting worse. If you are in need of a primary care provider and would like to establish care with [facility], please call [phone].    Your specific instructions are listed below:    Sty or Hordeolum    Use a hot moist compress to the involved lids 1-2 min. daily.  Follow this with scrubbing the lashes with baby shampoo and then washing your face with hot water.  Apply eye drops as directed after the treatments.    Follow-up if you fail to improve or if you worsen.     Apply the eyedrops as below    Lay down and apply one drop of the antibiotic eye drops in the affected eye four times daily.    Close the eyes gently and rest both eyes in the closed position for 5 min. after application of the drops.  This gives the medicine a chance to absorb into the tissue.  If you are in the upright position the drops will rapidly drain out into the nose.    Wash hands with soap and water  frequently.    Try to avoid touching the dropper to the lashes to avoid contaminating the dropper.     Use the drops for about 2 days after the redness has cleared or about 1 week

## 2018-07-31 NOTE — Progress Notes (Signed)
Medication reconciliation, allergies verified and vitals taken by Arturo Morton, MAC, STR

## 2018-08-16 DIAGNOSIS — E119 Type 2 diabetes mellitus without complications: Secondary | ICD-10-CM | POA: Diagnosis not present

## 2018-08-16 DIAGNOSIS — Z794 Long term (current) use of insulin: Secondary | ICD-10-CM | POA: Diagnosis not present

## 2018-08-28 ENCOUNTER — Ambulatory Visit: Payer: Medicare Other | Admitting: Internal Medicine

## 2018-10-14 DIAGNOSIS — Z23 Encounter for immunization: Secondary | ICD-10-CM | POA: Diagnosis not present

## 2018-10-24 ENCOUNTER — Encounter (INDEPENDENT_AMBULATORY_CARE_PROVIDER_SITE_OTHER): Payer: Self-pay | Admitting: Internal Medicine

## 2018-10-24 DIAGNOSIS — M2550 Pain in unspecified joint: Secondary | ICD-10-CM

## 2018-10-24 DIAGNOSIS — L4050 Arthropathic psoriasis, unspecified: Secondary | ICD-10-CM

## 2018-10-24 NOTE — Telephone Encounter (Signed)
Referral to [facility] rheumatology signed.    Thank you.    Trudi Ida, ARNP

## 2018-11-14 DIAGNOSIS — Z1231 Encounter for screening mammogram for malignant neoplasm of breast: Secondary | ICD-10-CM | POA: Diagnosis not present

## 2018-11-14 DIAGNOSIS — Z6829 Body mass index (BMI) 29.0-29.9, adult: Secondary | ICD-10-CM | POA: Diagnosis not present

## 2018-11-14 DIAGNOSIS — Z779 Other contact with and (suspected) exposures hazardous to health: Secondary | ICD-10-CM | POA: Diagnosis not present

## 2018-11-14 DIAGNOSIS — Z124 Encounter for screening for malignant neoplasm of cervix: Secondary | ICD-10-CM | POA: Diagnosis not present

## 2018-11-28 ENCOUNTER — Encounter (HOSPITAL_BASED_OUTPATIENT_CLINIC_OR_DEPARTMENT_OTHER): Payer: Self-pay | Admitting: Rheumatology

## 2018-11-28 ENCOUNTER — Ambulatory Visit: Payer: Medicare PPO | Attending: Rheumatology | Admitting: Rheumatology

## 2018-11-28 VITALS — BP 124/76 | HR 81 | Temp 96.4°F | Wt 113.0 lb

## 2018-11-28 DIAGNOSIS — H04123 Dry eye syndrome of bilateral lacrimal glands: Secondary | ICD-10-CM

## 2018-11-28 DIAGNOSIS — I7300 Raynaud's syndrome without gangrene: Secondary | ICD-10-CM

## 2018-11-28 LAB — PROTEIN/CREATININE RATIO, TIMED URINE
Creatinine/24H, Urine: UNDETERMINED mg/(24.h) (ref 700–1800)
Creatinine/Unit, Urine: 68 mg/dL
Protein (Total), Urine: 6 mg/dL
Protein/Creatinine Ratio: <0.1 (ref <0.2)
Total Protein/24H, Urine: UNDETERMINED g/d (ref 0.05–0.08)

## 2018-11-28 LAB — CBC, DIFF
% Basophils: 1 %
% Eosinophils: 5 %
% Immature Granulocytes: 0 %
% Lymphocytes: 27 %
% Monocytes: 9 %
% Neutrophils: 58 %
% Nucleated RBC: 0 %
Absolute Eosinophil Count: 0.33 10*3/uL (ref 0.00–0.50)
Absolute Lymphocyte Count: 1.77 10*3/uL (ref 1.00–4.80)
Basophils: 0.03 10*3/uL (ref 0.00–0.20)
Hematocrit: 41 % (ref 36–45)
Hemoglobin: 13.5 g/dL (ref 11.5–15.5)
Immature Granulocytes: 0.02 10*3/uL (ref 0.00–0.05)
MCH: 32.8 pg (ref 27.3–33.6)
MCHC: 33.2 g/dL (ref 32.2–36.5)
MCV: 99 fL — ABNORMAL HIGH (ref 81–98)
Monocytes: 0.61 10*3/uL (ref 0.00–0.80)
Neutrophils: 3.74 10*3/uL (ref 1.80–7.00)
Nucleated RBC: 0 10*3/uL
Platelet Count: 214 10*3/uL (ref 150–400)
RBC: 4.11 10*6/uL (ref 3.80–5.00)
RDW-CV: 12.6 % (ref 11.6–14.4)
WBC: 6.5 10*3/uL (ref 4.3–10.0)

## 2018-11-28 LAB — URINALYSIS COMPLETE, URN
Bacteria, URN: NONE SEEN
Bilirubin (Qual), URN: NEGATIVE
Epith Cells_Renal/Trans,URN: NEGATIVE /HPF
Epith Cells_Squamous, URN: NEGATIVE /LPF
Glucose Qual, URN: NEGATIVE mg/dL
Leukocyte Esterase, URN: NEGATIVE
Nitrite, URN: NEGATIVE
Occult Blood, URN: NEGATIVE
Protein (Alb Semiquant), URN: NEGATIVE mg/dL
RBC, URN: NEGATIVE /HPF
Specific Gravity, URN: 1.014 g/mL (ref 1.006–1.027)
WBC, URN: NEGATIVE /HPF
pH, URN: 6 (ref 5.0–8.0)

## 2018-11-28 LAB — COMPLEMENT C4: Complement C4: 26 mg/dL (ref 13–50)

## 2018-11-28 LAB — COMPREHENSIVE METABOLIC PANEL
ALT (GPT): 19 U/L (ref 7–33)
AST (GOT): 24 U/L (ref 9–38)
Albumin: 4.4 g/dL (ref 3.5–5.2)
Alkaline Phosphatase (Total): 65 U/L (ref 38–172)
Anion Gap: 7 (ref 4–12)
Bilirubin (Total): 0.6 mg/dL (ref 0.2–1.3)
Calcium: 10.2 mg/dL (ref 8.9–10.2)
Carbon Dioxide, Total: 31 meq/L (ref 22–32)
Chloride: 102 meq/L (ref 98–108)
Creatinine: 0.7 mg/dL (ref 0.38–1.02)
Glucose: 90 mg/dL (ref 62–125)
Potassium: 4.1 meq/L (ref 3.6–5.2)
Protein (Total): 7.7 g/dL (ref 6.0–8.2)
Sodium: 140 meq/L (ref 135–145)
Urea Nitrogen: 18 mg/dL (ref 8–21)
eGFR by CKD-EPI: >60 mL/min/{1.73_m2} (ref >59)

## 2018-11-28 LAB — C_REACTIVE PROTEIN: C_Reactive Protein: 1.3 mg/L (ref 0.0–10.0)

## 2018-11-28 LAB — COMPLEMENT C3: Complement C3: 97 mg/dL (ref 87–200)

## 2018-11-28 LAB — SED RATE: Erythrocyte Sedimentation Rate: 7 mm/h (ref 0–20)

## 2018-11-28 NOTE — Progress Notes (Signed)
[facility]  [address] [place]  [address]  TEL: [phone]  l  FAX: [phone]      11/28/2018    PRIMARY CARE PROVIDER:  [place]  [id number] [address] [place] [address]    CONSULTING PROVIDER:  Trudi Ida  [phone] [address] [place] [address]    PATIENT: Paula Bowman    [id number]    Reason for visit:   Toe pain  Referred by Trudi Ida    History of Present Illness:   Paula Bowman is a 68 year old female with history of breast cancer and GERD who presents to Rheumatology for an initial visit with a chief complaint of toe pain. Per referral on 11/13/2018, "I am referring this 68 year old female with a systemic illness such as vasculitis, sarcoidosis, Behcet's disease or an undiagnosed illness.The objective signs of systemic disease include arthralgia. My clinical question is: psoriatic arthritis." Her family medicine ARNP, Trudi Ida, recommended seeing a rheumatologist for further treatments.    She began to experience redness and swelling with no pain in her bilateral 2nd toes in ~2015 during the winter that improved during the summer. She was seen by a doctor who thought it was a skin-related condition and prescribed topical steroid creams. This progressively worsened and beginning in 2020, she started to experience pain at the tips of her bilateral 2nd-3rd(L>R) toes that have become sensitive to touch. She recalls having difficulty wearing shoes recently due to her toe pain. She notes occasional purple discoloration of her toes and her wife reports they looked "close to black" in color. She believes her toe pain worsens with increased physical activity. She denies wounds/ulcers at the tips of her toes. She started topical nifedipine/sildenafil about 3 months ago which resolved her purple discoloration and did not improve her toe pain. She was seen by a podiatrist who suspected psoriatic  arthritis. She recalls testing negative for lupus ~2-3 times previously.     She reports a new growth on her left thumb that is painful and stiff. She notes a new bump on her left hip that causes joint pain and disrupts her sleep in the middle of the night. She endorses morning stiffness that lasts for a few hours and improves with activity.    She experiences chronic diarrhea and a history of GERD treated with Nexium. She has been diagnosed with microscopic colitis in 2002 which improved until ~2016 when it recurred and worsened. 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 She endorses chronic fatigue, secondary to her colitis. She notes history of a brain aneurysm being monitored since 2019. She has history of asthma, secondary to chronic smoking and uses an inhaler as needed, but this has not been bothersome lately. She has a history of eczema and denies psoriasis. 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 She uses Restasis eye drops daily, restarted recently, and notes this improves her dry eyes. She endorses dry mouth at night. She notes history of low blood pressure (averaging at ~90/55) and often feels faint. She has history of breast cancer and underwent a lumpectomy and radiation therapy in 2010. She has osteoporosis and underwent a bone density scan on 07/10/2018 that revealed osteopenia and low fracture risk. She denies oral  ulcers, chest pain, shortness of breath, nausea, headaches, or abnormal hair loss. She denies history blood clots. She has surgical history of right knee replacement in 2016, abdominal hernia repair, and partial hysterectomy. She denies family history of rheumatoid arthritis, psoriasis, colitis, or lupus. She has family history of breast cancer in her younger sister. She denies smoking and consumes about 14 glasses of wine weekly.     ROS:   Constitutional:  As noted in HPI  above   Eyes:  As noted in HPI above   Ears, Nose, Mouth, Throat:  As noted in HPI above   Cardiovascular:  Negative    Respiratory:  As noted in HPI above   Gastrointestinal:  As noted in HPI above   Genitourinary:  Negative   Musculoskeletal: As noted in HPI.   Skin:  As noted in HPI above   Neurological:  As noted in HPI above   Psychiatric:  Negative    Endocrine:  Negative    Hematologic/Lymphatic:  Negative   Allergic/Immunologic:  As noted in HPI above    PHYSICAL EXAMINATION:  Vital signs:  Blood pressure 124/76, pulse 81, temperature 96.4 F (35.8 C), temperature source Temporal, weight 113 lb (51.3 kg), SpO2 98 %.  General:  Healthy, alert, no distress.  Skin:  Skin color, texture, turgor normal. No rashes or concerning lesions on exposed skin.  HEENT:  Normocephalic. No masses, lesions, or abnormalities.   Cardiovascular:  Well-perfused. No clubbing, cyanosis, or edema.   Pulmonary/Chest:  Effort normal.   Abdominal:  Exhibits no distension.   Extremities:  Normal, without deformities, edema, or skin discoloration.   Psychiatric:  Mood, memory, affect, and judgment normal.   Neuro:  Grossly normal to observation, gait normal.  Musculoskeletal:   Hands: No tenderness or swelling in MCP or PIP joints of bilateral hands  Wrists: No tenderness or swelling, full ROM, negative Tinel's sign  Elbows: No tenderness or swelling, full ROM, no tenderness in lateral epicondyle  Shoulders: No tenderness or swelling, full ROM  Hips: Full ROM, no tenderness in greater trochanter  Knees: No tenderness or swelling, full ROM, no crepitus.  Ankles: Tenderness in right ankle  Feet: Tenderness in DIP4 (right), redness in toes 2,3,4 (right), toes 2,3 (left), no ulcers at toes    Problem List:  Patient Active Problem List   Diagnosis   . Anxiety state   . Other chronic allergic conjunctivitis   . GERD (gastroesophageal reflux disease)   . Eczema   . Allergic rhinitis   . Onychomycosis   . Back pain   . 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 Neck pain   . Need for prophylactic vaccination with combined diphtheria-tetanus-pertussis (DTP) vaccine   . Radiculopathy of cervical region   . Heat exhaustion   . Dry eye   . Malignant neoplasm of breast ([facility])   . Malignant neoplasm of skin   . Microscopic colitis   . Brain aneurysm     Current Medications:   Current Outpatient Medications:   .  clindamycin 1 % External Solution, , Disp: , Rfl:   .  Colestipol HCl 1 g Oral Tab, Take 2 tablets (2 g) by mouth daily. Take other medications 1 hour before or 4 hours after colestipol, Disp: , Rfl:   .  Fluticasone Propionate 50 MCG/ACT Nasal Suspension, Spray 2 sprays into each nostril daily. For prevention/control of nasal/sinus congestion, Disp: 3 Inhaler, Rfl: 3  .  fluticasone  propionate HFA (Flovent HFA) 220 MCG/ACT inhaler, Inhale 1 puff by mouth 2 times a day., Disp: 3 Inhaler, Rfl: 1  .  olopatadine 0.1 % ophthalmic solution, Place 1 drop in each EYE 2 times a day. as needed (the azelastine is not tolerated), Disp: 1 bottle, Rfl: 0  .  sulfacetamide sodium 10 % ophthalmic solution, Use 1 drop in the affected eye(s) every 4 hours for the next week, Disp: 5 mL, Rfl: 0  .  triamcinolone 0.5 % ointment, Use twice a day for 2 weeks, Disp: 80 g, Rfl: 0    Past Medical History:  Past Medical History:   Diagnosis Date   . Esophageal reflux    . Malignant neoplasm of breast (female), unspecified site 2008    lumpectomy: Stage 0; Radiation; was ERP; She did not complete Tamoxifen due to side effects x1 yr.    . Microscopic colitis      Surgical History:  Past Surgical History:   Procedure Laterality Date   . BREAST LUMPECTOMY     . COLONOSCOPY STOMA DX INCLUDING COLLJ [place] SPX  2006    [place] , FU 10 years   . HERNIA REPAIR     . Knee, R knee at age 52, removed floating bone     . TOTAL ABDOMINAL HYSTERECT W/WO RMVL TUBE OVARY      for fibroids   . UNLISTED PROCEDURE FEMUR/KNEE Right 1969   . VAGINAL HYSTERECTOMY UTERUS 250 GM/<   1994    Ovaries in place     Family History:   Her family history includes Cancer in her sister. There is no history of Colon Cancer.  Social History:  She reports that she quit smoking about 30 years ago. Her smoking use included cigarettes. She has a 30.00 pack-year smoking history. She has never used smokeless tobacco. She reports current alcohol use of about 0.8 standard drinks of alcohol per week. She reports that she does not use drugs.     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                           Labs:   Results for orders placed or performed in visit on 07/06/18   RBC Sedimentation Rate   Result Value Ref Range    Erythrocyte Sedimentation Rate 11 0 - 20 mm/h   CRP, high sensitivity   Result Value Ref Range    C_Reactive Protein 2.8 0.0 - 10.0 mg/L   Rheumatoid Factor   Result Value Ref Range    Rheumatoid Factor <10 <15 [IU]/mL   Comprehensive Metabolic Panel   Result Value Ref Range    Sodium 139 135 - 145 meq/L    Potassium 4.6 3.6 - 5.2 meq/L    Chloride 104 98 - 108 meq/L    Carbon Dioxide, Total 31 22 - 32 meq/L    Anion Gap 4 4 - 12    Glucose 95 62 - 125 mg/dL    Urea Nitrogen 25 (H) 8 - 21 mg/dL    Creatinine 0.68 0.38 - 1.02 mg/dL    Protein (Total) 6.9 6.0 - 8.2 g/dL    Albumin 4.2 3.5 - 5.2 g/dL    Bilirubin (Total) 0.5 0.2 - 1.3 mg/dL    Calcium 9.6 8.9 - 10.2 mg/dL    AST (GOT) 18 9 - 38 U/L  Alkaline Phosphatase (Total) 52 38 - 172 U/L    ALT (GPT) 14 7 - 33 U/L    GFR, Calc, European American >60 >59 mL/min/[1.73_m2]    GFR, Calc, African American >60 >59 mL/min/[1.73_m2]    GFR, Information       Calculated GFR in mL/min/1.73 m2 by MDRD equation.  Inaccurate with changing renal function.  See [email or web address]   CBC with Differential   Result Value Ref Range    WBC 9.02 4.3 - 10.0 10*3/uL    RBC 3.94 3.80 - 5.00 10*6/uL    Hemoglobin 12.7 11.5 - 15.5 g/dL    Hematocrit 39 36 - 45 %     MCV 100 (H) 81 - 98 fL    MCH 32.2 27.3 - 33.6 pg    MCHC 32.2 32.2 - 36.5 g/dL    Platelet Count 199 150 - 400 10*3/uL    RDW-CV 12.3 11.6 - 14.4 %    % Neutrophils 64 %    % Lymphocytes 28 %    % Monocytes 4 %    % Eosinophils 4 %    % Basophils 0 %    % Immature Granulocytes 0 %    Neutrophils 5.77 1.80 - 7.00 10*3/uL    Absolute Lymphocyte Count 2.53 1.00 - 4.80 10*3/uL    Monocytes 0.36 0.00 - 0.80 10*3/uL    Absolute Eosinophil Count 0.36 0.00 - 0.50 10*3/uL    Basophils 0.00 0.00 - 0.20 10*3/uL    Immature Granulocytes 0.00 0.00 - 0.05 10*3/uL    Nucleated RBC 0.00 0.00 10*3/uL    % Nucleated RBC 0 %    RBC Morphology See CBC - no additional findings     Platelet Morphology See CBC - no additional findings     WBC Morphology See CBC - no additional findings    TSH with Reflexive Free T4   Result Value Ref Range    TSH with Reflexive Free T4 1.742 0.400 - 5.000 u[IU]/mL   Lipid Panel   Result Value Ref Range    Cholesterol (Total) 207 (H) <200 mg/dL    Triglyceride 73 <150 mg/dL    Cholesterol (HDL) 70 >39 mg/dL    Cholesterol (LDL) 122 <130 mg/dL    Non-HDL Cholesterol 137 0 - 159 mg/dL    Cholesterol/HDL Ratio 3.0     Lipid Panel, Additional Info. (NOTE)        Imaging:   07/10/2018 - DEXA SCAN    02/26/2016 - CEREBROVASCULAR EXAM       06/19/2014 - CT ABDOMEN PELVIS W CONTRAST  IMPRESSION:  1. Findings of cholelithiasis without cholecystitis.  2. Otherwise unremarkable abdominal/pelvic CT without etiology for the patient's complaints of abdominal pain and diarrhea detected. Some liquid like stool scattered throughout the colon is noted.    04/22/2014 - MR RIGHT KNEE WO CONTRAST  IMPRESSION:   1. Medial collateral ligament sprain.  2. Tricompartmental osteoarthritic changes as described above. There is additionally, chondromalacia including focal full-thickness defects.  3. Mild signal along the free edge of the anterior horn body junction could reflect small tear or degeneration. There is also question of  mild fluid signal along the anterior horn of the lateral meniscus.  4. Moderate joint effusion and popliteal cyst.  5. Mild signal change along the proximal patellar tendon medially could reflect tendinosis or artifact.    03/04/2014 - MR C SPINE WO CONTRAST  IMPRESSION:   1. No significant central or foraminal stenosis of the cervical spine.  2. Multilevel facet arthropathy most notably at the right C2-3 facet associated with marrow and soft tissue edema which could represent a superimposed inflammatory component.    Impression and Recommendations:  Patient is a 68 year old female with a history of breast cancer and GERD who came in today for an evaluation of toe pain. She reported redness and swelling in her toes during the Winter that progressively worsened since ~2015 and more recently became painful at the tips of her toes. She noted her toes occasionally turned purple which improved with a topical nifedipine/sildenafil cream, although her pain remained. She experienced dry eyes and has been using Restasis eye drops. Exam showed red discoloration of her toes with no tenderness or swelling in joints.     I discussed it is possible that her symptoms may be related to Raynaud's phenomena and ordered labs to evaluate further. She reported dry eyes and raynaud's phenomena could be associated with Sjogren's syndrome. There was no evidence for psoriatic arthritis. I counseled her on her treatment. I advised her to maintain nifedipine/sildenafil cream to see if her symptoms continue to improve. I'd consider oral sildenafil therapy if her symptoms worsen as she is unable to take nifedipine due to her history of low blood pressure. I will get in touch with her after reviewing the labs.    I spent a total time of 60 minutes face-to-face with the patient, of which more than 50% was spent counseling as outlined in this note.      Assessment:  1. Raynaud's phenomenon  2. Dry eyes    PLAN:  The following was discussed  with the patient.  Recommendations are as follows:  1. Labs today, as listed below:   2. Continue topical sildenafil prn for your Raynaud's phenomena     - ANA Reflex Comprehensive Panel  - Complement C3  - Complement C4  - Anti Phospholipid Panel  - LUPUS ANTICOAG WITH INTERP  - Protein/Creatinine Ratio, Urine  - Urinalysis Complete  - CBC with Differential  - CRP, high sensitivity  - RBC Sedimentation Rate  - Comprehensive Metabolic Panel  - SSA/Ro Antibody  - SSB/La Antibodies      Follow up: PRN, pending lab results      11/28/2018 @ 1:55 PM - I, Louisa Second acted as a Education administrator and documented the service/procedure performed to the best of my knowledge in the presence of Julaine Hua, MD who will provide the final review and authentication.  Signed: Louisa Second

## 2018-11-28 NOTE — Progress Notes (Signed)
I, Julious Langlois, MD, personally performed the services as described in this documentation. All medical record entries made by the scribe were at my direction and in my presence. I have reviewed the chart and discharge instructions and agree that the record reflects my personal performance and is accurate and complete.

## 2018-11-29 ENCOUNTER — Encounter (HOSPITAL_BASED_OUTPATIENT_CLINIC_OR_DEPARTMENT_OTHER): Payer: Self-pay | Admitting: Rheumatology

## 2018-11-29 LAB — ANTI PHOSPHOLIPID PANEL
Anti B2GP1 IgG Comment: NEGATIVE
Anti B2GP1 IgM Comment: NEGATIVE
Anti Beta2 Glycoprotein 1, IgG: <2 U/mL (ref 0–18)
Anti Beta2 Glycoprotein 1, IgM: 2 U/mL (ref 0–18)
Anti Cardiolipin, IgG Comment: NEGATIVE
Anti Cardiolipin, IgG: <2 GPL U/mL (ref 0–18)
Anti Cardiolipin, IgM Comment: NEGATIVE
Anti Cardiolipin, IgM: 2 MPL U/mL (ref 0–18)

## 2018-11-29 LAB — ANA REFLEX COMPREHENSIVE PANEL
Ana Interpretation Comment 1: NEGATIVE
Ana Screen By Multiplex: NEGATIVE
Antibodies to Nuclear Ags by IF (ANA): NEGATIVE

## 2018-12-02 ENCOUNTER — Encounter (HOSPITAL_BASED_OUTPATIENT_CLINIC_OR_DEPARTMENT_OTHER): Payer: Self-pay | Admitting: Rheumatology

## 2018-12-02 LAB — LUPUS ANTICOAG WITH INTERP
Fibrinogen: 356 mg/dL (ref 150–450)
Lupus Anticoagulant, DRVVT: 1.03 {ratio} (ref 0.00–1.30)
Partial Thromboplastin Time: 27 s (ref 22–35)
Prothrombin Time Patient: 12.4 s (ref 10.7–15.6)
Thrombin Time: 16 s (ref 16–25)

## 2018-12-02 NOTE — Telephone Encounter (Signed)
Pt called to follow up. Please call pt to go over her lab results. She said she is disturbed that no one from the clinic has reached out yet.

## 2018-12-06 NOTE — Telephone Encounter (Signed)
I already replied to the patient.

## 2018-12-08 ENCOUNTER — Other Ambulatory Visit: Payer: Self-pay

## 2018-12-23 ENCOUNTER — Encounter: Payer: Self-pay | Admitting: Gastroenterology

## 2019-02-23 IMAGING — NM NM MISC PROCEDURE
9 series · 54 of 54 positions shown · non-contrast
Comparison: none

[Series 1: wbr rest · 6.40mm/px · 6 of 64 frames shown]
[frame 6/64]
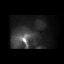
[frame 16/64]
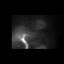
[frame 27/64]
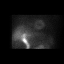
[frame 38/64]
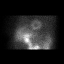
[frame 48/64]
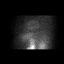
[frame 59/64]
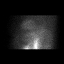

[Series 1: wbr_r-proj_st wbr rest · 6.40mm/px · 6 of 64 frames shown]
[frame 6/64]
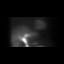
[frame 16/64]
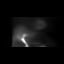
[frame 27/64]
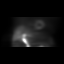
[frame 38/64]
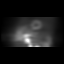
[frame 48/64]
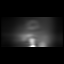
[frame 59/64]
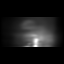

[Series 1: rest sax · 6.4mm · 6.40mm/px · 6 of 23 frames shown]
[frame 2/23]
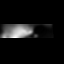
[frame 6/23]
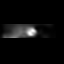
[frame 10/23]
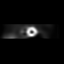
[frame 14/23]
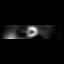
[frame 18/23]
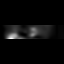
[frame 22/23]
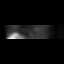

[Series 2: wbr_s-proj_st wbr stress-gsp · 6.40mm/px · 6 of 512 frames shown]
[frame 43/512]
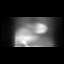
[frame 128/512]
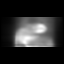
[frame 214/512]
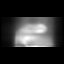
[frame 299/512]
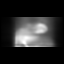
[frame 384/512]
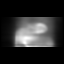
[frame 470/512]
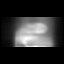

[Series 2: stress sax · 6.4mm · 6.40mm/px · 6 of 23 frames shown]
[frame 2/23]
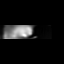
[frame 6/23]
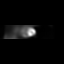
[frame 10/23]
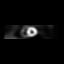
[frame 14/23]
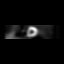
[frame 18/23]
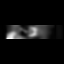
[frame 22/23]
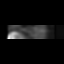

[Series 2: stress sax gs · 6.4mm · 6.40mm/px · 6 of 184 frames shown]
[frame 16/184]
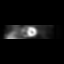
[frame 46/184]
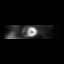
[frame 77/184]
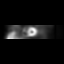
[frame 108/184]
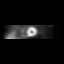
[frame 138/184]
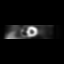
[frame 169/184]
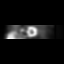

[Series 2: wbr stress-gsp · 6.40mm/px · 6 of 511 frames shown]
[frame 43/511  full-range]
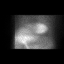
[frame 128/511  full-range]
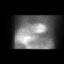
[frame 213/511  full-range]
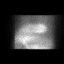
[frame 298/511  full-range]
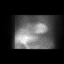
[frame 383/511  full-range]
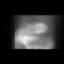
[frame 469/511  full-range]
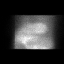

[Series 3: wbr stress-sum-em · 6.40mm/px · 6 of 64 frames shown]
[frame 6/64]
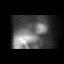
[frame 16/64]
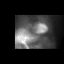
[frame 27/64]
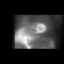
[frame 38/64]
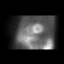
[frame 48/64]
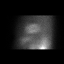
[frame 59/64]
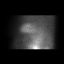

[Series 3: wbr_s-proj_st wbr stress-sum-em · 6.40mm/px · 6 of 64 frames shown]
[frame 6/64]
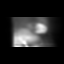
[frame 16/64]
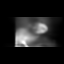
[frame 27/64]
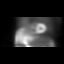
[frame 38/64]
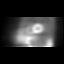
[frame 48/64]
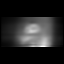
[frame 59/64]
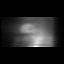

[54 of 54 positions shown; findings below may reference images not displayed]

Canned report from images found in remote index.

Refer to host system for actual result text.

## 2019-02-27 ENCOUNTER — Encounter (INDEPENDENT_AMBULATORY_CARE_PROVIDER_SITE_OTHER): Payer: Self-pay

## 2019-03-03 ENCOUNTER — Encounter: Payer: Self-pay | Admitting: Gastroenterology

## 2019-03-04 ENCOUNTER — Other Ambulatory Visit: Payer: Self-pay

## 2019-03-04 ENCOUNTER — Ambulatory Visit (INDEPENDENT_AMBULATORY_CARE_PROVIDER_SITE_OTHER): Payer: Medicare Other | Admitting: Internal Medicine

## 2019-03-04 ENCOUNTER — Encounter: Payer: Self-pay | Admitting: Internal Medicine

## 2019-03-04 DIAGNOSIS — Z794 Long term (current) use of insulin: Secondary | ICD-10-CM | POA: Diagnosis not present

## 2019-03-04 DIAGNOSIS — H10502 Unspecified blepharoconjunctivitis, left eye: Secondary | ICD-10-CM | POA: Diagnosis not present

## 2019-03-04 DIAGNOSIS — I10 Essential (primary) hypertension: Secondary | ICD-10-CM

## 2019-03-04 DIAGNOSIS — E782 Mixed hyperlipidemia: Secondary | ICD-10-CM

## 2019-03-04 DIAGNOSIS — J019 Acute sinusitis, unspecified: Secondary | ICD-10-CM | POA: Diagnosis not present

## 2019-03-04 DIAGNOSIS — E119 Type 2 diabetes mellitus without complications: Secondary | ICD-10-CM | POA: Diagnosis not present

## 2019-03-04 LAB — URINALYSIS, ROUTINE W REFLEX MICROSCOPIC
Bilirubin Urine: NEGATIVE
Ketones, ur: NEGATIVE
Nitrite: NEGATIVE
Specific Gravity, Urine: 1.025 (ref 1.000–1.030)
Total Protein, Urine: NEGATIVE
Urine Glucose: NEGATIVE
Urobilinogen, UA: 0.2 (ref 0.0–1.0)
pH: 5.5 (ref 5.0–8.0)

## 2019-03-04 LAB — CBC WITH DIFFERENTIAL/PLATELET
Basophils Absolute: 0.1 10*3/uL (ref 0.0–0.1)
Basophils Relative: 1.3 % (ref 0.0–3.0)
Eosinophils Absolute: 0.4 10*3/uL (ref 0.0–0.7)
Eosinophils Relative: 6 % — ABNORMAL HIGH (ref 0.0–5.0)
HCT: 40.6 % (ref 36.0–46.0)
Hemoglobin: 13.3 g/dL (ref 12.0–15.0)
Lymphocytes Relative: 32.3 % (ref 12.0–46.0)
Lymphs Abs: 2.1 10*3/uL (ref 0.7–4.0)
MCHC: 32.8 g/dL (ref 30.0–36.0)
MCV: 84.2 fl (ref 78.0–100.0)
Monocytes Absolute: 0.4 10*3/uL (ref 0.1–1.0)
Monocytes Relative: 5.5 % (ref 3.0–12.0)
Neutro Abs: 3.5 10*3/uL (ref 1.4–7.7)
Neutrophils Relative %: 54.9 % (ref 43.0–77.0)
Platelets: 318 10*3/uL (ref 150.0–400.0)
RBC: 4.82 Mil/uL (ref 3.87–5.11)
RDW: 14 % (ref 11.5–15.5)
WBC: 6.4 10*3/uL (ref 4.0–10.5)

## 2019-03-04 LAB — HEPATIC FUNCTION PANEL
ALT: 15 U/L (ref 0–35)
AST: 19 U/L (ref 0–37)
Albumin: 4 g/dL (ref 3.5–5.2)
Alkaline Phosphatase: 75 U/L (ref 39–117)
Bilirubin, Direct: 0 mg/dL (ref 0.0–0.3)
Total Bilirubin: 0.3 mg/dL (ref 0.2–1.2)
Total Protein: 7.7 g/dL (ref 6.0–8.3)

## 2019-03-04 LAB — BASIC METABOLIC PANEL
BUN: 15 mg/dL (ref 6–23)
CO2: 29 mEq/L (ref 19–32)
Calcium: 9.7 mg/dL (ref 8.4–10.5)
Chloride: 104 mEq/L (ref 96–112)
Creatinine, Ser: 0.94 mg/dL (ref 0.40–1.20)
GFR: 59.06 mL/min — ABNORMAL LOW (ref >60.00)
Glucose, Bld: 124 mg/dL — ABNORMAL HIGH (ref 70–99)
Potassium: 5.1 mEq/L (ref 3.5–5.1)
Sodium: 138 mEq/L (ref 135–145)

## 2019-03-04 LAB — LIPID PANEL
Cholesterol: 266 mg/dL — ABNORMAL HIGH (ref 0–200)
HDL: 49.8 mg/dL (ref >39.00)
LDL Cholesterol: 186 mg/dL — ABNORMAL HIGH (ref 0–99)
NonHDL: 215.97
Total CHOL/HDL Ratio: 5
Triglycerides: 149 mg/dL (ref 0.0–149.0)
VLDL: 29.8 mg/dL (ref 0.0–40.0)

## 2019-03-04 LAB — MICROALBUMIN / CREATININE URINE RATIO
Creatinine,U: 102.9 mg/dL
Microalb Creat Ratio: 5.5 mg/g (ref 0.0–30.0)
Microalb, Ur: 5.7 mg/dL — ABNORMAL HIGH (ref 0.0–1.9)

## 2019-03-04 LAB — TSH: TSH: 2.61 u[IU]/mL (ref 0.35–4.50)

## 2019-03-04 MED ORDER — ALPRAZOLAM 1 MG PO TABS: 1.0000 mg | ORAL_TABLET | Freq: Two times a day (BID) | ORAL | 5 refills | Status: DC | PRN

## 2019-03-04 NOTE — Assessment & Plan Note (Signed)
on Levimir and Novolog; off Metformin Labs cardiac CT scan for calcium scoring offered

## 2019-03-04 NOTE — Progress Notes (Signed)
Subjective:  Patient ID: Catherine Mora, female    DOB: [birth year]  Age: 69 y.o. MRN: [id number]  CC: No chief complaint on file.   HPI LYTZY COZIER presents for DM - A1c in the goal range, HTN - on Lisinopril F/u anxiety Niece died recently  Outpatient Medications Prior to Visit  Medication Sig Dispense Refill  . BIOTIN FORTE PO Take 500 mg by mouth daily.    . bisacodyl (DULCOLAX) 5 MG EC tablet Take 1 tablet (5 mg total) by mouth daily as needed for moderate constipation. 30 tablet 3  . fluticasone (FLONASE) 50 MCG/ACT nasal spray Place 2 sprays into both nostrils daily. 16 g 12  . hydrocortisone 2.5 % cream Apply topically 2 (two) times daily. 30 g 0  . ibuprofen (ADVIL,MOTRIN) 200 MG tablet Take 400 mg by mouth every 6 (six) hours as needed for headache.    . insulin aspart (NOVOLOG) 100 UNIT/ML FlexPen Take as directed per sliding scale.  Max daily dose: 50 units/day.    . Insulin Detemir (LEVEMIR FLEXPEN) 100 UNIT/ML Pen Inject 16 Units into the skin daily with breakfast. (Patient taking differently: Inject 26 Units into the skin daily at 10 pm. ) 15 mL 2  . Insulin Pen Needle 31G X 5 MM MISC Use with Levemir pen 100 each 0  . Krill Oil 1000 MG CAPS Take 1,000 mg by mouth daily.    Marland Kitchen lisinopril (ZESTRIL) 5 MG tablet Take 5 mg by mouth daily.    Marland Kitchen NOVOLOG FLEXPEN 100 UNIT/ML FlexPen TAKE AS DIRECTED PER SLIDING SCALE. MAX DAILY DOSE: 50 UNITS/DAY.  3  . ONETOUCH VERIO test strip 1 each by Other route 2 (two) times daily.     Marland Kitchen ALPRAZolam (XANAX) 1 MG tablet Take 1 tablet (1 mg total) by mouth 2 (two) times daily as needed for anxiety. 60 tablet 5   No facility-administered medications prior to visit.    ROS: Review of Systems  Constitutional: Negative for activity change, appetite change, chills, fatigue and unexpected weight change.  HENT: Negative for congestion, mouth sores and sinus pressure.   Eyes: Negative for visual disturbance.  Respiratory: Negative for  cough and chest tightness.   Gastrointestinal: Negative for abdominal pain and nausea.  Genitourinary: Negative for difficulty urinating, frequency and vaginal pain.  Musculoskeletal: Negative for back pain and gait problem.  Skin: Negative for pallor and rash.  Neurological: Negative for dizziness, tremors, weakness, numbness and headaches.  Psychiatric/Behavioral: Negative for confusion, sleep disturbance and suicidal ideas. The patient is nervous/anxious.     Objective:  BP 132/84 (BP Location: Left Arm, Patient Position: Sitting, Cuff Size: Normal)   Pulse 84   Temp 98 F (36.7 C) (Oral)   Ht 5\' 10"  (1.778 m)   Wt 189 lb (85.7 kg)   SpO2 97%   BMI 27.12 kg/m   BP Readings from Last 3 Encounters:  03/04/19 132/84  10/12/17 124/80  06/26/17 118/84    Wt Readings from Last 3 Encounters:  03/04/19 189 lb (85.7 kg)  10/12/17 193 lb (87.5 kg)  06/26/17 187 lb 1.3 oz (84.9 kg)    Physical Exam Constitutional:      General: She is not in acute distress.    Appearance: She is well-developed.  HENT:     Head: Normocephalic.     Right Ear: External ear normal.     Left Ear: External ear normal.     Nose: Nose normal.  Eyes:  General:        Right eye: No discharge.        Left eye: No discharge.     Conjunctiva/sclera: Conjunctivae normal.     Pupils: Pupils are equal, round, and reactive to light.  Neck:     Thyroid: No thyromegaly.     Vascular: No JVD.     Trachea: No tracheal deviation.  Cardiovascular:     Rate and Rhythm: Normal rate and regular rhythm.     Heart sounds: Normal heart sounds.  Pulmonary:     Effort: No respiratory distress.     Breath sounds: No stridor. No wheezing.  Abdominal:     General: Bowel sounds are normal. There is no distension.     Palpations: Abdomen is soft. There is no mass.     Tenderness: There is no abdominal tenderness. There is no guarding or rebound.  Musculoskeletal:        General: No tenderness.     Cervical  back: Normal range of motion and neck supple.  Lymphadenopathy:     Cervical: No cervical adenopathy.  Skin:    Findings: No erythema or rash.  Neurological:     Cranial Nerves: No cranial nerve deficit.     Motor: No abnormal muscle tone.     Coordination: Coordination normal.     Deep Tendon Reflexes: Reflexes normal.  Psychiatric:        Behavior: Behavior normal.        Thought Content: Thought content normal.        Judgment: Judgment normal.     Lab Results  Component Value Date   WBC 6.5 10/12/2017   HGB 12.7 10/12/2017   HCT 37.9 10/12/2017   PLT 307.0 10/12/2017   GLUCOSE 137 (H) 10/12/2017   CHOL 269 (H) 10/12/2017   TRIG 140.0 10/12/2017   HDL 54.70 10/12/2017   LDLDIRECT 219.0 08/14/2014   LDLCALC 186 (H) 10/12/2017   ALT 23 10/12/2017   AST 21 10/12/2017   NA 139 10/12/2017   K 4.8 10/12/2017   CL 103 10/12/2017   CREATININE 0.86 10/12/2017   BUN 20 10/12/2017   CO2 29 10/12/2017   TSH 4.37 10/12/2017   HGBA1C 7.7 (H) 10/03/2016   MICROALBUR 5.2 (H) 10/12/2017    Myocardial Perfusion Imaging  Result Date: 05/09/2016  Nuclear stress EF: 70%. The left ventricular ejection fraction is hyperdynamic (>65%).  This is a low risk study.  The study is normal. No ischemia . no infarction     Assessment & Plan:   There are no diagnoses linked to this encounter.   Meds ordered this encounter  Medications  . ALPRAZolam (XANAX) 1 MG tablet    Sig: Take 1 tablet (1 mg total) by mouth 2 (two) times daily as needed for anxiety.    Dispense:  60 tablet    Refill:  5     Follow-up: No follow-ups on file.  Walker Kehr, MD

## 2019-03-04 NOTE — Assessment & Plan Note (Signed)
On Lisinopril

## 2019-03-04 NOTE — Patient Instructions (Addendum)
We are committed to keeping you informed about the COVID-19 vaccine.  As the vaccine continues to become available for each phase, we will ensure that patients who meet the criteria receive the information they need to access vaccination opportunities. Continue to check your MyChart account and [email or web address] for updates. Please review the Phase 1b information below.  Following [place]'s guidelines for the distribution of COVID-19 vaccines, we are pleased to share our plans to begin offering vaccines to those 75 and older (Phase 1b). Here are details of those plans:  [facility] . Appointments required. [facility] Open to those age 82 or older . Not restricted to [facility] or [facility] residents . Location: [place], [place], [place]  . Offered daily beginning: Saturday, March 01, 2019 . Hours: 8 a.m. to 1 p.m. (10 a.m. to 2 p.m. this Saturday and Sunday, Jan. 9 and 10) . Registration for vaccine clinic appointments is open as of 10 a.m., Friday, January 8 . Please visit [email or web address] to register or call [phone] . There will be no copay required for the vaccine. Insurance information will be requested if available.  [facility] will offer this vaccination clinic through Sunday, January 17, after which we will switch to a larger location to offer public vaccination at a larger scale following state guidelines. We will provide additional information as details become available.   Also, people who are 75 years and older can sign up to get vaccinated against COVID-19 at clinics that begin Monday through the [facility]. The vaccinations are open to all in that age group, regardless of their health condition or living situation.  Appointments are required and can be made beginning at 8 a.m. Friday by calling [phone] and selecting option 2. Walk-ins will not be  accepted. Clinic locations are: [facility], [place]; [facility] [address] [place]; . [place] at [facility], [id number], [place].  Participants are asked to wear a face covering at vaccination sites. Visit [email or web address] and click on the "[id number] Vaccine Info" rectangle for more information about vaccinations.  In addition to the clinics above, we are working in partnership with county health agencies in [place], [place], [place] and [place] counties to ensure continuing vaccination availability in alignment with state guidelines in the weeks and months ahead.   Information on phase 1b COVID-19 vaccination clinics being offered by local county health agencies is provided in the website links below for your convenience:   . [place]  . [place]  . [place] . [place]  . [place]'s phase 1b vaccination guidelines, prioritizing those 75 and over as the next eligible group to receive the COVID-19 vaccine, are detailed at [email or web address].   Additional information: Those 75 and older who register for [place]'s COVID-19 vaccination clinic at [facility] will be provided with additional information, including the need to be observed for 15 minutes following vaccination for your safety. Our COVID-19 testing clinic will not start at this site until 2 p.m. daily to ensure no people arriving for vaccination intersect with those seeking a COVID-19 test. [place] rooms at the [facility] are clean and safe, and our staff will use all appropriate protective equipment to keep you safe. This vaccine clinic will be located in a completely separate area of this facility  than where health care is provided. No interaction will occur between patients or care teams at this  site and our vaccination clinic.   As we proceed through phases of the vaccine rollout, we remind everyone to remain vigilant in practicing the 3 W's - wear a mask, wash your hands and wait 6 feet apart from others. These safety practices are based in science and are the best tool we have to reduce the spread of the virus.   For our most current information, please visit [email or web address].    Cardiac CT calcium scoring test $150 Tel # is [phone]   Computed tomography, more commonly known as a CT or CAT scan, is a diagnostic medical imaging test. Like traditional x-rays, it produces multiple images or pictures of the inside of the body. The cross-sectional images generated during a CT scan can be reformatted in multiple planes. They can even generate three-dimensional images. These images can be viewed on a computer monitor, printed on film or by a 3D printer, or transferred to a CD or DVD. CT images of internal organs, bones, soft tissue and blood vessels provide greater detail than traditional x-rays, particularly of soft tissues and blood vessels. A cardiac CT scan for coronary calcium is a non-invasive way of obtaining information about the presence, location and extent of calcified plaque in the coronary arteries--the vessels that supply oxygen-containing blood to the heart muscle. Calcified plaque results when there is a build-up of fat and other substances under the inner layer of the artery. This material can calcify which signals the presence of atherosclerosis, a disease of the vessel wall, also called coronary artery disease (CAD). People with this disease have an increased risk for heart attacks. In addition, over time, progression of plaque build up (CAD) can narrow the arteries or even close off blood flow to the heart. The result may be chest pain, sometimes called "angina," or a heart attack. Because calcium is a marker of CAD, the amount of calcium detected on a  cardiac CT scan is a helpful prognostic tool. The findings on cardiac CT are expressed as a calcium score. Another name for this test is coronary artery calcium scoring.  What are some common uses of the procedure? The goal of cardiac CT scan for calcium scoring is to determine if CAD is present and to what extent, even if there are no symptoms. It is a screening study that may be recommended by a physician for patients with risk factors for CAD but no clinical symptoms. The major risk factors for CAD are: . high blood cholesterol levels  . family history of heart attacks  . diabetes  . high blood pressure  . cigarette smoking  . overweight or obese  . physical inactivity   A negative cardiac CT scan for calcium scoring shows no calcification within the coronary arteries. This suggests that CAD is absent or so minimal it cannot be seen by this technique. The chance of having a heart attack over the next two to five years is very low under these circumstances. A positive test means that CAD is present, regardless of whether or not the patient is experiencing any symptoms. The amount of calcification--expressed as the calcium score--may help to predict the likelihood of a myocardial infarction (heart attack) in the coming years and helps your medical doctor or cardiologist decide whether the patient may need to take preventive medicine or undertake other measures such as diet and exercise to lower the risk for heart attack.  The extent of CAD is graded according to your calcium score:  Calcium Score  Presence of CAD (coronary artery disease)  0 No evidence of CAD   1-10 Minimal evidence of CAD  11-100 Mild evidence of CAD  101-400 Moderate evidence of CAD  Over 400 Extensive evidence of CAD

## 2019-03-04 NOTE — Assessment & Plan Note (Signed)
1/21 cardiac CT scan for calcium scoring offered

## 2019-03-04 NOTE — Addendum Note (Signed)
Addended by: Trenda Moots on: [id number] 11:41 AM   Modules accepted: Orders

## 2019-03-10 ENCOUNTER — Telehealth (INDEPENDENT_AMBULATORY_CARE_PROVIDER_SITE_OTHER): Payer: Medicare PPO | Admitting: Internal Medicine

## 2019-03-10 DIAGNOSIS — H04129 Dry eye syndrome of unspecified lacrimal gland: Secondary | ICD-10-CM

## 2019-03-10 DIAGNOSIS — I7300 Raynaud's syndrome without gangrene: Secondary | ICD-10-CM

## 2019-03-10 NOTE — Progress Notes (Signed)
Reason for visit:   Chief Complaint   Patient presents with   . Follow-Up      f/u from rheumatologist visit         Cervical screening/PAP: NO  Mammo: NO  Colon Screen: NO  Have you seen a specialist since your last visit: NO        HM Due:   Health Maintenance   Topic Date Due   . Zoster Vaccine (2 of 3) 07/08/2012   . Influenza Vaccine (1) 11/21/2018   . Depression Screening (PHQ-2)  07/06/2019   . Medicare Annual Wellness Visit  07/06/2019   . Breast Cancer Screening  07/28/2020   . Lipid Disorders Screening  07/06/2023   . DTaP, Tdap, and Td Vaccines (2 - Td) 11/28/2023   . Colorectal Cancer Screening  09/14/2024   . Osteoporosis Screening  Completed   . Pneumococcal Vaccine: 65+ years  Completed   . Hepatitis C Screening  Addressed   . Hepatitis A Vaccine  Aged Out   . Hepatitis B Vaccine  Aged Out   . Pneumococcal Vaccine: Pediatrics (0-5 years) and At-Risk Patients (6-64 years)  Aged Out              No future appointments.  Isidoro Donning, CMA

## 2019-03-10 NOTE — Patient Instructions (Signed)
.  Patient Education     Raynaud Disease  Your healthcare provider has told you that you have Raynaud disease. It is also called Raynaud phenomenon or Raynaud syndrome. There is no cure for Raynaud disease, but you can manage it to help prevent attacks.    What are the symptoms of Raynaud disease?  A Raynaud disease attack is often triggered by cold or stress. During an attack, blood vessels suddenly narrow (called vasospasm).  This most often happens in fingers and toes. In rare cases, the nose, ears, nipples, or even tongue are affected. Narrowed blood vessels reduce the blood supply to the area. The area then turns white, blue, or red. The area may feel numb or painful. As the attack passes, the blood vessels open. The affected area may turn bright red as it warms up, then returns to normal color.  What is the cause of Raynaud disease?  With Raynaud disease, it is believed that blood vessels in the affected areas overrespond to certain triggers, such as cold. This makes them narrow (called vasospasm) much more than in people without the disease. Experts don’t know what causes the blood vessels to react so strongly to certain triggers. In between attacks, the blood vessels are normal and healthy. Attacks don’t permanently damage the blood vessels. But may thicken the artery walls.   In some cases, Raynaud disease happens along with another disease or condition. This is often a connective tissue disorder, such as lupus, scleroderma, or rheumatoid arthritis. This is called secondary Raynaud disease (as opposed to primary Raynaud disease discussed above) and may be more severe. If this is the case for you, you and your healthcare provider can discuss treatment for the underlying condition.  What are the risk factors?  Risk factors for Raynaud disease include:  · Women are more likely to get Raynaud disease than men.  · Younger people are at higher risk, usually ages 15 to 30.  · Living in colder climates increases  risk.  · Having a family member with Raynaud disease increases your risk.  · Underlying rheumatoid conditions may increase your risk.   What are possible triggers?  Triggers for Raynaud disease include:   · Cold  · Stress  · Caffeine  · Smoking  · Repetitive movements  · Certain medicines, such as beta-blockers, migraine medicine, birth control pills and others  · Injury  How is Raynaud disease diagnosed?  Your description of your symptoms, a health history, and a physical exam are often enough for a diagnosis. Blood tests and other tests may be done to see if any underlying conditions are present and rule out other problems.  How is Raynaud disease treated?  There is no cure for Raynaud disease. But you can control symptoms and reduce the number and severity of attacks. For most people, avoiding triggers is enough to limit attacks. Your healthcare provider may suggest the following:  · Take precautions to help prevent your hands and feet from losing circulation. This includes:  ? Dressing warmly in cold weather.  ? Wearing gloves or mittens when your hands may become cold, such as when you use the refrigerator or freezer.  ? Avoiding stress and caffeine.  ? Exercising regularly. This may reduce the number and severity of attacks.   ? If you smoke, quitting may improve the condition. This is because smoking causes your blood vessels to narrow and reduces blood flow.  · Soak your hands or feet in warm (not hot) water. Do this at the first sign of an attack. Keep soaking until your skin color returns to normal.    In some people, symptoms are persistent or troubling. For these cases, other treatments are a choice. Your healthcare provider can tell you more about the following:  · Prescription medicines. Some medicines that relax and widen blood vessels, such as calcium channel blockers. These may help relieve symptoms.  · Nerve surgery. This is used for severe cases that don’t respond to other treatments. Surgery  removes the nerves that surround the blood vessels in the hands and feet. Without nerve stimulation, the blood vessels stay more relaxed. They are less likely to become very narrow due to stimuli. Nerves may be blocked using injections in some cases.  Most cases of Raynaud disease are not cause for concern. The disease doesn’t get worse and isn’t likely to cause any permanent damage. If attacks are severe, last for a long time, or occur very often, skin damage may result. Controlling attacks can help prevent this.  When to seek medical care  The following problems happen rarely, but they can be serious. Call your healthcare provider right away if you notice any of the following:  · Infection or sores on the skin  · A finger or toe turns black  · The skin breaks open on its own  · A rash develops  · A finger or toe joint becomes painful or swollen  [employer] last reviewed this educational content on 07/21/2016  © 2000-2020 [employer]. [address], [place] [address]. All rights reserved. This information is not intended as a substitute for professional medical care. Always follow your healthcare professional's instructions.

## 2019-03-10 NOTE — Progress Notes (Signed)
SUBJECTIVE:     Chief Complain:  Paula Bowman is an 69 year old female here for   Chief Complaint   Patient presents with   . Follow-Up      f/u from rheumatologist visit    Distant Site Telemedicine Encounter  This visit was conducted during the COVID-19 Pandemic.   I conducted this encounter from [facility] via secure, live, face-to-face video conference with the patient. Paula Bowman was located at home with  self.  Prior to the interview, the risks and benefits of telemedicine were discussed with the patient and verbal consent was obtained.        History of Present Illness:  Paula Bowman is a 69 year old female here today for follow up after seen by rheumatology  She had labs done to R/O causes of raynaud's phenomenon    She was diagnosed with Raynaud's disease and was started on medication which she said she did try the  topical sildenafil prn   She continued to have the problem   She said her labs were not explained to her and possibly she wanted to have a second opinion         HISTORY:    The following portions of the patient's history were reviewed with the patient and updated as appropriate: problem list, current medications, allergies.    Patient Active Problem List   Diagnosis   . Anxiety state   . Other chronic allergic conjunctivitis   . GERD (gastroesophageal reflux disease)   . Eczema   . Allergic rhinitis   . Onychomycosis   . Back pain   . 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 Neck pain   . Need for prophylactic vaccination with combined diphtheria-tetanus-pertussis (DTP) vaccine   . Radiculopathy of cervical region   . Heat exhaustion   . Dry eye   . Malignant neoplasm of breast ([place])   . Malignant neoplasm of skin   . Microscopic colitis   . Brain aneurysm     Outpatient Medications Marked as Taking for the 03/10/19 encounter (Telemedicine) with Stacie Glaze, MD   Medication Sig Dispense Refill   . ascorbic acid 500 MG tablet Take 500 mg by mouth.     [facility] aspirin  81 MG EC tablet Take 81 mg by mouth.     . clindamycin 1 % External Solution      . Colestipol HCl 1 g Oral Tab Take 2 tablets (2 g) by mouth daily. Take other medications 1 hour before or 4 hours after colestipol     . Denta 5000 Plus 1.1 % cream BRUSH ONCE DAILY FOR 2 MINUTES AT BEDTIME. DO NOT EAT OR DRINK OR RINSE FOR 30 MINUTES AFTER.     [facility] esomeprazole 20 MG DR capsule Take 20 mg by mouth.     . Fluticasone Propionate 50 MCG/ACT Nasal Suspension Spray 2 sprays into each nostril daily. For prevention/control of nasal/sinus congestion 3 Inhaler 3   . fluticasone propionate HFA (Flovent HFA) 220 MCG/ACT inhaler Inhale 1 puff by mouth 2 times a day. 3 Inhaler 1   . magnesium oxide 400 MG tablet Take 400 mg by mouth.     . Multiple Vitamin (Multi-Vitamin) tablet Take 1 tablet by mouth.     [facility] olopatadine 0.1 % ophthalmic solution Place 1 drop in each EYE 2 times a day. as needed (the azelastine is not tolerated) 1 bottle 0   . sulfacetamide sodium  10 % ophthalmic solution Use 1 drop in the affected eye(s) every 4 hours for the next week 5 mL 0   . triamcinolone 0.5 % ointment Use twice a day for 2 weeks 80 g 0           OBJECTIVE:   Vitals:    03/10/19 1447   Weight: 113 lb (51.3 kg)   Height: 5' 7.5" (1.715 m)   BMI (Calculated): 17.47       Physical Exam   Constitutional: She is oriented to person, place, and time and well-developed, well-nourished, and in no distress. No distress.   HENT:   Head: Normocephalic and atraumatic.   Eyes: Conjunctivae are normal.   Cardiovascular:   No murmur heard.  Pulmonary/Chest: No respiratory distress.   Neurological: She is alert and oriented to person, place, and time.   Vitals reviewed.      ASSESSMENT and PLAN:   1. Raynaud's phenomenon without gangrene  2. Dry eye  Reviewed labs and explained to the patient   Reviewed rheumatology labs   Refer to see Rheumatology for second opinion   - REFERRAL TO RHEUMATOLOGY   I have spent 25 minutes on this visit today including chart  review prior to the visit, face to face time with the patient, and documentation and coordination of care after the visit."       FOLLOW Up:

## 2019-03-11 ENCOUNTER — Other Ambulatory Visit: Payer: Self-pay

## 2019-03-11 ENCOUNTER — Ambulatory Visit (INDEPENDENT_AMBULATORY_CARE_PROVIDER_SITE_OTHER)
Admission: RE | Admit: 2019-03-11 | Discharge: 2019-03-11 | Disposition: A | Discharge status: Home / Self Care | Payer: Medicare Other | Source: Ambulatory Visit | Attending: Internal Medicine | Admitting: Internal Medicine

## 2019-03-11 ENCOUNTER — Encounter (INDEPENDENT_AMBULATORY_CARE_PROVIDER_SITE_OTHER): Payer: Self-pay | Admitting: Internal Medicine

## 2019-03-11 DIAGNOSIS — E782 Mixed hyperlipidemia: Secondary | ICD-10-CM

## 2019-03-11 DIAGNOSIS — E119 Type 2 diabetes mellitus without complications: Secondary | ICD-10-CM

## 2019-03-11 DIAGNOSIS — Z794 Long term (current) use of insulin: Secondary | ICD-10-CM

## 2019-03-12 ENCOUNTER — Other Ambulatory Visit: Payer: Self-pay | Admitting: Internal Medicine

## 2019-03-12 DIAGNOSIS — I2510 Atherosclerotic heart disease of native coronary artery without angina pectoris: Secondary | ICD-10-CM | POA: Insufficient documentation

## 2019-03-12 MED ORDER — ASPIRIN EC 81 MG PO TBEC: 81.0000 mg | DELAYED_RELEASE_TABLET | Freq: Every day | ORAL | 3 refills | Status: AC

## 2019-03-13 ENCOUNTER — Ambulatory Visit (AMBULATORY_SURGERY_CENTER): Payer: Self-pay | Admitting: *Deleted

## 2019-03-13 ENCOUNTER — Other Ambulatory Visit: Payer: Self-pay

## 2019-03-13 VITALS — Temp 97.3°F | Ht 70.0 in | Wt 189.0 lb

## 2019-03-13 DIAGNOSIS — Z01818 Encounter for other preprocedural examination: Secondary | ICD-10-CM

## 2019-03-13 DIAGNOSIS — Z86010 Personal history of colonic polyps: Secondary | ICD-10-CM

## 2019-03-13 MED ORDER — SUPREP BOWEL PREP KIT 17.5-3.13-1.6 GM/177ML PO SOLN: 1.0000 | Freq: Once | ORAL | 0 refills | Status: AC

## 2019-03-13 NOTE — Progress Notes (Signed)
No egg or soy allergy known to patient  Past  issues with past sedation with any surgeries  or procedures of PONV , no intubation problems  No diet pills per patient No home 02 use per patient  No blood thinners per patient  Pt states  issues with constipation is occ a few times a month and she uses otc stool softener when has issues  No A fib or A flutter  EMMI video sent to pt's e mail   Due to the COVID-19 pandemic we are asking patients to follow these guidelines. Please only bring one care partner. Please be aware that your care partner may wait in the car in the parking lot or if they feel like they will be too hot to wait in the car, they may wait in the lobby on the 4th floor. All care partners are required to wear a mask the entire time (we do not have any that we can provide them), they need to practice social distancing, and we will do a Covid check for all patient's and care partners when you arrive. Also we will check their temperature and your temperature. If the care partner waits in their car they need to stay in the parking lot the entire time and we will call them on their cell phone when the patient is ready for discharge so they can bring the car to the front of the building. Also all patient's will need to wear a mask into building.

## 2019-03-21 ENCOUNTER — Telehealth: Payer: Self-pay

## 2019-03-21 ENCOUNTER — Ambulatory Visit (HOSPITAL_BASED_OUTPATIENT_CLINIC_OR_DEPARTMENT_OTHER): Payer: Medicare PPO

## 2019-03-21 ENCOUNTER — Ambulatory Visit (HOSPITAL_BASED_OUTPATIENT_CLINIC_OR_DEPARTMENT_OTHER): Payer: Self-pay | Admitting: Infectious Disease

## 2019-03-21 DIAGNOSIS — Z23 Encounter for immunization: Secondary | ICD-10-CM

## 2019-03-21 NOTE — Telephone Encounter (Signed)
Patient calling and states that she received the letter about taking cholesterol medication. States that she would take the medication if Dr Alain Marion would send it to her pharmacy.   [id number] - [place], [place]

## 2019-03-21 NOTE — Telephone Encounter (Signed)
If provider wanted pt on medication he would have had pt on it.

## 2019-03-24 ENCOUNTER — Ambulatory Visit (INDEPENDENT_AMBULATORY_CARE_PROVIDER_SITE_OTHER): Payer: Medicare Other

## 2019-03-24 ENCOUNTER — Other Ambulatory Visit: Payer: Self-pay | Admitting: Gastroenterology

## 2019-03-24 ENCOUNTER — Other Ambulatory Visit: Payer: Self-pay | Admitting: Internal Medicine

## 2019-03-24 ENCOUNTER — Other Ambulatory Visit: Payer: Self-pay

## 2019-03-24 DIAGNOSIS — Z1159 Encounter for screening for other viral diseases: Secondary | ICD-10-CM

## 2019-03-24 MED ORDER — PITAVASTATIN CALCIUM 1 MG PO TABS: 1.0000 mg | ORAL_TABLET | Freq: Every day | ORAL | 11 refills | Status: DC

## 2019-03-25 ENCOUNTER — Other Ambulatory Visit: Payer: Self-pay | Admitting: Internal Medicine

## 2019-03-25 DIAGNOSIS — Z794 Long term (current) use of insulin: Secondary | ICD-10-CM | POA: Diagnosis not present

## 2019-03-25 DIAGNOSIS — E119 Type 2 diabetes mellitus without complications: Secondary | ICD-10-CM | POA: Diagnosis not present

## 2019-03-25 LAB — SARS CORONAVIRUS 2 (TAT 6-24 HRS): SARS Coronavirus 2: NEGATIVE

## 2019-03-27 ENCOUNTER — Ambulatory Visit (AMBULATORY_SURGERY_CENTER): Payer: Medicare Other | Admitting: Gastroenterology

## 2019-03-27 ENCOUNTER — Other Ambulatory Visit: Payer: Self-pay

## 2019-03-27 ENCOUNTER — Encounter: Payer: Self-pay | Admitting: Gastroenterology

## 2019-03-27 VITALS — BP 128/74 | HR 66 | Temp 96.9°F | Resp 15 | Ht 70.0 in | Wt 189.0 lb

## 2019-03-27 DIAGNOSIS — K635 Polyp of colon: Secondary | ICD-10-CM

## 2019-03-27 DIAGNOSIS — Z86010 Personal history of colonic polyps: Secondary | ICD-10-CM

## 2019-03-27 DIAGNOSIS — D120 Benign neoplasm of cecum: Secondary | ICD-10-CM | POA: Diagnosis not present

## 2019-03-27 MED ORDER — SODIUM CHLORIDE 0.9 % IV SOLN: 500.0000 mL | INTRAVENOUS | Status: DC

## 2019-03-27 NOTE — Progress Notes (Signed)
Called to room to assist during endoscopic procedure.  Patient ID and intended procedure confirmed with present staff. Received instructions for my participation in the procedure from the performing physician.  

## 2019-03-27 NOTE — Op Note (Signed)
[name] Patient Name: Catherine Mora Procedure Date: 03/27/2019 7:26 AM MRN: [id number] Endoscopist: Remo Lipps P. Havery Moros , MD Age: 69 Referring MD:  Date of Birth: Apr 23, 1950 Gender: Female Account #: [id number] Procedure:                Colonoscopy Indications:              Surveillance: Personal history of adenomatous                            polyps (3-4 small polyps) on last colonoscopy 3                            years ago Medicines:                Monitored Anesthesia Care Procedure:                Pre-Anesthesia Assessment:                           - Prior to the procedure, a History and Physical                            was performed, and patient medications and                            allergies were reviewed. The patient's tolerance of                            previous anesthesia was also reviewed. The risks                            and benefits of the procedure and the sedation                            options and risks were discussed with the patient.                            All questions were answered, and informed consent                            was obtained. Prior Anticoagulants: The patient has                            taken no previous anticoagulant or antiplatelet                            agents. ASA Grade Assessment: II - A patient with                            mild systemic disease. After reviewing the risks                            and benefits, the patient was deemed in  satisfactory condition to undergo the procedure.                           After obtaining informed consent, the colonoscope                            was passed under direct vision. Throughout the                            procedure, the patient's blood pressure, pulse, and                            oxygen saturations were monitored continuously. The                            Colonoscope was introduced through the anus and                            advanced to the the cecum, identified by                            appendiceal orifice and ileocecal valve. The                            colonoscopy was performed without difficulty. The                            patient tolerated the procedure well. The quality                            of the bowel preparation was good. The ileocecal                            valve, appendiceal orifice, and rectum were                            photographed. Scope In: 8:03:12 AM Scope Out: 8:20:10 AM Scope Withdrawal Time: 0 hours 13 minutes 14 seconds  Total Procedure Duration: 0 hours 16 minutes 58 seconds  Findings:                 The perianal and digital rectal examinations were                            normal.                           A 3 mm polyp was found in the ileocecal valve. The                            polyp was sessile. The polyp was removed with a                            cold snare. Resection and retrieval were complete.  A single small-mouthed diverticulum was found in                            the transverse colon.                           Internal hemorrhoids were found during retroflexion.                           The exam was otherwise without abnormality. Complications:            No immediate complications. Estimated blood loss:                            Minimal. Estimated Blood Loss:     Estimated blood loss was minimal. Impression:               - One 3 mm polyp at the ileocecal valve, removed                            with a cold snare. Resected and retrieved.                           - Diverticulosis in the transverse colon.                           - Internal hemorrhoids.                           - The examination was otherwise normal. Recommendation:           - Patient has a contact number available for                            emergencies. The signs and symptoms of potential                             delayed complications were discussed with the                            patient. Return to normal activities tomorrow.                            Written discharge instructions were provided to the                            patient.                           - Resume previous diet.                           - Continue present medications.                           - Await pathology results with further  recommendations Catherine Mora. Catherine Romberger, MD 03/27/2019 8:24:13 AM This report has been signed electronically.

## 2019-03-27 NOTE — Progress Notes (Signed)
PT taken to PACU. Monitors in place. VSS. Report given to RN. 

## 2019-03-27 NOTE — Patient Instructions (Signed)
Read all of the handouts given to you by your recovery room nurse.  Thank-you for choosing [place] for your healthcare needs today.  YOU HAD AN ENDOSCOPIC PROCEDURE TODAY AT [facility]:   Refer to the procedure report that was given to you for any specific questions about what was found during the examination.  If the procedure report does not answer your questions, please call your gastroenterologist to clarify.  If you requested that your care partner not be given the details of your procedure findings, then the procedure report has been included in a sealed envelope for you to review at your convenience later.  YOU SHOULD EXPECT: Some feelings of bloating in the abdomen. Passage of more gas than usual.  Walking can help get rid of the air that was put into your GI tract during the procedure and reduce the bloating. If you had a lower endoscopy (such as a colonoscopy or flexible sigmoidoscopy) you may notice spotting of blood in your stool or on the toilet paper. If you underwent a bowel prep for your procedure, you may not have a normal bowel movement for a few days.  Please Note:  You might notice some irritation and congestion in your nose or some drainage.  This is from the oxygen used during your procedure.  There is no need for concern and it should clear up in a day or so.  SYMPTOMS TO REPORT IMMEDIATELY:   Following lower endoscopy (colonoscopy or flexible sigmoidoscopy):  Excessive amounts of blood in the stool  Significant tenderness or worsening of abdominal pains  Swelling of the abdomen that is new, acute  Fever of 100F or higher   For urgent or emergent issues, a gastroenterologist can be reached at any hour by calling [phone].   DIET:  We do recommend a small meal at first, but then you may proceed to your regular diet.  Drink plenty of fluids but you should avoid alcoholic beverages for 24 hours. Try to increase the fiber in your diet, and drink plenty of  water.  ACTIVITY:  You should plan to take it easy for the rest of today and you should NOT DRIVE or use heavy machinery until tomorrow (because of the sedation medicines used during the test).    FOLLOW UP: Our staff will call the number listed on your records 48-72 hours following your procedure to check on you and address any questions or concerns that you may have regarding the information given to you following your procedure. If we do not reach you, we will leave a message.  We will attempt to reach you two times.  During this call, we will ask if you have developed any symptoms of COVID 19. If you develop any symptoms (ie: fever, flu-like symptoms, shortness of breath, cough etc.) before then, please call [phone].  If you test positive for Covid 19 in the 2 weeks post procedure, please call and report this information to [place].    If any biopsies were taken you will be contacted by phone or by letter within the next 1-3 weeks.  Please call us at [phone] if you have not heard about the biopsies in 3 weeks.    SIGNATURES/CONFIDENTIALITY: You and/or your care partner have signed paperwork which will be entered into your electronic medical record.  These signatures attest to the fact that that the information above on your After Visit Summary has been reviewed and is understood.  Full responsibility of the confidentiality  of this discharge information lies with you and/or your care-partner.

## 2019-03-31 ENCOUNTER — Telehealth: Payer: Self-pay

## 2019-03-31 NOTE — Telephone Encounter (Signed)
  Follow up Call-  Call back number 03/27/2019  Post procedure Call Back phone  # [phone]  Permission to leave phone message Yes  Some recent data might be hidden     Patient questions:  Do you have a fever, pain , or abdominal swelling? No. Pain Score  0 *  Have you tolerated food without any problems? Yes.    Have you been able to return to your normal activities? Yes.    Do you have any questions about your discharge instructions: Diet   No. Medications  No. Follow up visit  No.  Do you have questions or concerns about your Care? No.  Actions: * If pain score is 4 or above: No action needed, pain <4. 1. Have you developed a fever since your procedure? no  2.   Have you had an respiratory symptoms (SOB or cough) since your procedure? no  3.   Have you tested positive for COVID 19 since your procedure no  4.   Have you had any family members/close contacts diagnosed with the COVID 19 since your procedure?  no   If yes to any of these questions please route to Joylene John, RN and Alphonsa Gin, Therapist, sports.

## 2019-04-01 ENCOUNTER — Other Ambulatory Visit: Payer: Self-pay | Admitting: Internal Medicine

## 2019-04-01 MED ORDER — PRAVASTATIN SODIUM 20 MG PO TABS: 20.0000 mg | ORAL_TABLET | Freq: Every day | ORAL | 11 refills | Status: DC

## 2019-04-28 ENCOUNTER — Ambulatory Visit (HOSPITAL_BASED_OUTPATIENT_CLINIC_OR_DEPARTMENT_OTHER): Payer: Medicare PPO

## 2019-04-28 DIAGNOSIS — Z23 Encounter for immunization: Secondary | ICD-10-CM

## 2019-05-01 ENCOUNTER — Encounter (INDEPENDENT_AMBULATORY_CARE_PROVIDER_SITE_OTHER): Payer: Self-pay

## 2019-05-01 ENCOUNTER — Telehealth (INDEPENDENT_AMBULATORY_CARE_PROVIDER_SITE_OTHER): Payer: Self-pay | Admitting: Internal Medicine

## 2019-05-01 NOTE — Telephone Encounter (Signed)
ecare message sent

## 2019-05-01 NOTE — Telephone Encounter (Signed)
1st attempt made, LVM to return call to clinic.     Please Schedule patient for Medicare Wellness Visit in person, by phone, or by telehealth. Whichever the patient prefers.     -Joelle Roswell  Medical Assistant  [facility], [place]

## 2019-05-14 NOTE — Telephone Encounter (Signed)
scheduled

## 2019-05-19 ENCOUNTER — Ambulatory Visit (INDEPENDENT_AMBULATORY_CARE_PROVIDER_SITE_OTHER): Payer: Medicare PPO | Admitting: Internal Medicine

## 2019-05-19 ENCOUNTER — Encounter (INDEPENDENT_AMBULATORY_CARE_PROVIDER_SITE_OTHER): Payer: Self-pay | Admitting: Internal Medicine

## 2019-05-19 DIAGNOSIS — G8929 Other chronic pain: Secondary | ICD-10-CM

## 2019-05-19 DIAGNOSIS — M25561 Pain in right knee: Secondary | ICD-10-CM

## 2019-05-19 DIAGNOSIS — Z0001 Encounter for general adult medical examination with abnormal findings: Secondary | ICD-10-CM

## 2019-05-19 NOTE — Progress Notes (Signed)
ANNUAL WELLNESS VISIT    Paula Bowman is a 69 year old female who presents for an Annual Wellness Visit.   []  Initial   [x]  Subsequent   Right TKR 5 years ago   Still has pain on and off   No redness warmness or tenderness   No swelling   No injuries after replacement except once after 9 months   She has some discomfort in her left knee  She said she walks a lot , rarely she is uncomfortable enough to not walk       INFORMATION GATHERING:   The following areas were confirmed with patient/caregiver and/or updated in Epic at this visit:       Past Medical History   Past Surgical History   Family History   Social History    Current medications and supplements (including vitamins and calcium)   Allergies  [x]  All of the above components have been reviewed and updated.     Opioid Use:  [x]  Reviewed    The information below is up to date at the end of this visit:   Review of functional ability, hearing, fall risk and safety, diet, physical activity, and health habits  (via HRA or direct review with patient or caregiver)     The Health Risk Assessment (HRA) for today's visit was completed:  []  as Patient-Entered Questionnaire via eCare     [x]  as paper HRA form, completed by or with the patient or caregiver     []  in HRA template documentation, completed by staff or provider with patient or caregiver at visit      List of current providers and suppliers:    []   See Care Team Section   [x]   See EHR Encounters for [facility] Providers involved in care   []   Other providers and suppliers outside [facility]:     Depression screening: PHQ-2:    PHQ-9 (if done):       EXAM:  BP (P) 103/65   Pulse (P) 80   Temp (P) 36.1 C (Temporal)   Resp (P) 16   Ht (P) 5' 7.5" (1.715 m)   Wt (P) 51.7 kg (114 lb)   SpO2 (P) 99%   BMI (P) 17.59 kg/m        GAIT:    [x]  Normal, stable, independent    []  Abnormal (describe):    As assessed by:     [x]  Direct Observation     []  Timed Up and Go     []  Other:  COGNITION:     [x]  Intact     []  Abnormal (describe):     As assessed by:      [x]  Direct Observation     []  Brief Cognitive Screen      Physical Exam  Vitals signs reviewed.   Constitutional:       Appearance: Normal appearance.   HENT:      Head: Normocephalic and atraumatic.   Eyes:      General: No scleral icterus.        Right eye: No discharge.         Left eye: No discharge.      Conjunctiva/sclera: Conjunctivae normal.   Neck:      Musculoskeletal: Neck supple.   Cardiovascular:      Rate and Rhythm: Normal rate and regular rhythm.      Pulses: Normal pulses.      Heart sounds: Normal heart sounds. No murmur.  No gallop.    Pulmonary:      Effort: Pulmonary effort is normal. No respiratory distress.      Breath sounds: No stridor. No wheezing, rhonchi or rales.   Chest:      Chest wall: No tenderness.   Abdominal:      General: Abdomen is flat. There is no distension.      Palpations: There is no mass.      Tenderness: There is no abdominal tenderness. There is no right CVA tenderness, left CVA tenderness, guarding or rebound.      Hernia: No hernia is present.   Musculoskeletal:      Right knee: She exhibits normal range of motion, no swelling, no effusion, no ecchymosis and no deformity. No tenderness found. No medial joint line, no lateral joint line, no MCL, no LCL and no patellar tendon tenderness noted.      Right lower leg: No edema.      Left lower leg: No edema.   Lymphadenopathy:      Cervical: No cervical adenopathy.   Skin:     General: Skin is warm and dry.   Neurological:      General: No focal deficit present.      Mental Status: She is alert and oriented to person, place, and time. Mental status is at baseline.   Psychiatric:         Mood and Affect: Mood normal.         Behavior: Behavior normal.        ADVANCE CARE PLANNING (ACP)     Advance care planning:   [x]  Patient Accepted   []  Patient Declined     Check all that apply:   []  Advance directives are on file in her chart   [x]  Explained & discussed  advance directives at this visit   []  Completed advance care planning form(s) at this visit   []  Other Advance Care Planning discussion at this visit (describe):       Time Spent on ACP: []  None       [x]  1-15 minutes      []  16-30 minutes      []  >30 minutes      ASSESSMENT:       Paula Bowman was seen for her Annual Wellness Visit, including identification of risk factors & conditions that may affect her health and function in the future.   1. Encounter for well adult exam with abnormal findings      2. Chronic pain of right knee   Discussed with the patient     - Referral to Hip/Knee Replacement; Future      Actions at this visit:       Establishing or updating a written schedule of screening and prevention measures recommended and appropriate for Paula Bowman for the next 5-10 years   Establishing or updating a list of her risk factors and conditions for which lifestyle or medical interventions are recommended or underway, including mental health risks and conditions, and including risks/benefits of treatment   Furnishing personalized health advice and, as appropriate, referrals to health education or preventive counseling services or programs (such as fall prevention, tobacco cessation, physical activity, nutrition, weight loss)    [x]  All of the above components have been reviewed and updated.     COUNSELING:      Based on today's evaluation, I recommend the following ways to improve your health or functioning.   1- Weight Control  2- Physical activity   3- Social activity   4- mental activity     You have the following risk factors and/or medical conditions for which there are recommended ways (included in this list) to help you stay as healthy as possible:   Age   Brain Aneurysm     Here are screening & prevention measures recommended for you:  Health Maintenance   Topic Date Due   . Zoster Vaccine (2 of 3) 07/08/2012   . Depression Screening (PHQ-2)  07/06/2019   . Medicare  Annual Wellness Visit  07/06/2019   . Breast Cancer Screening  07/28/2020   . Lipid Disorders Screening  07/06/2023   . DTaP, Tdap, and Td Vaccines (2 - Td) 11/28/2023   . Colorectal Cancer Screening  09/14/2024   . Osteoporosis Screening  Completed   . Pneumococcal Vaccine: 65+ years  Completed   . Influenza Vaccine  Completed   . COVID-19 Vaccine  Completed   . Hepatitis C Screening  Addressed   . Hepatitis A Vaccine  Aged Out   . Hepatitis B Vaccine  Aged Out       Please plan to have a Subsequent Annual Wellness Visit in 1 year.

## 2019-05-19 NOTE — Progress Notes (Signed)
Patient roomed by Deborra Medina, CMA     Covid - 24 Screening are you having any of the following?    1- Fever NO  2- New Shortness of Breath NO  3- New Cough NO  4- Headache  NO  5- Sore throat  NO  6- Muscle Pain NO  7- New loss of sense of smell and/or taste  NO  8- Gastrointestinal symptoms like nausea, vomiting, or diarrhea  NO  9- Chills  NO  10- Rhinorrhea (Runny Nose)   NO    Reason For Visit:  Wellness    Health Maintenance Due:    Vaccines Due:  07/08/2012  Zoster Vaccine (2 of 3)    11/21/2018  Influenza Vaccine (1)    Future Health Maintenance Due:  07/06/2019  Depression Screening (PHQ-2)    07/06/2019  Medicare Annual Wellness Visit    07/28/2020  Breast Cancer Screening    07/06/2023  Lipid Disorders Screening    11/28/2023  DTaP, Tdap, and Td Vaccines (2 - Td)    09/14/2024  Colorectal Cancer Screening    Future Visits  No future visits scheduled

## 2019-05-19 NOTE — Patient Instructions (Addendum)
You may be a candidate for the following screening and prevention measures:    Health Maintenance   Topic Date Due   . Zoster Vaccine (2 of 3) 07/08/2012   . Depression Screening (PHQ-2)  07/06/2019   . Medicare Annual Wellness Visit  07/06/2019   . Breast Cancer Screening  07/28/2020   . Lipid Disorders Screening  07/06/2023   . DTaP, Tdap, and Td Vaccines (2 - Td) 11/28/2023   . Colorectal Cancer Screening  09/14/2024   . Osteoporosis Screening  Completed   . Pneumococcal Vaccine: 65+ years  Completed   . Influenza Vaccine  Completed   . COVID-19 Vaccine  Completed   . Hepatitis C Screening  Addressed   . Hepatitis A Vaccine  Aged Out   . Hepatitis B Vaccine  Aged Out   Based on today's evaluation, I recommend the following ways to improve your health or functioning.   1- Weight Control   2- Physical activity   3- Social activity   4- mental activity     You have the following risk factors and/or medical conditions for which there are recommended ways (included in this list) to help you stay as healthy as possible:   Age   Brain Aneurysm

## 2019-06-04 ENCOUNTER — Encounter (HOSPITAL_BASED_OUTPATIENT_CLINIC_OR_DEPARTMENT_OTHER): Payer: Self-pay

## 2019-06-05 ENCOUNTER — Telehealth (INDEPENDENT_AMBULATORY_CARE_PROVIDER_SITE_OTHER): Payer: Self-pay | Admitting: Internal Medicine

## 2019-06-05 NOTE — Telephone Encounter (Signed)
Referral processed and fax confirmation received. LVM advising patient.    Your fax has been successfully sent to [facility] at [id number].  ------------------------------------------------------------  From: [facility] Referral Team  ------------------------------------------------------------  06/05/2019 11:56:55 AM Transmission Record   Sent to [id number] with remote ID "VOA Fax Finder [id number]"   Inbound user ID [facility], routing code [phone]   Result: (0/339;0/0) Success   Page record: 1 - 4   Elapsed time: 03:55 on channel 33    Nothing further needed. Closing TE.

## 2019-06-05 NOTE — Telephone Encounter (Signed)
RETURN CALL: Voicemail - Detailed Message      SUBJECT:  Referral Request/Questions      REASON FOR REFERRAL: chronic pain pain of rt knee   NAME OF CLINIC/SPECIALTY: orthopedics at [facility]   PROVIDER: unknown   PHONE: did not have   FAX: did not have   ADDITIONAL INFORMATION: Patient was referred to [facility] in March, by Dr Gershon Mussel and she would prefer to go to [facility], as it's closer to home.   Please phone to advise patient of her request.

## 2019-06-10 DIAGNOSIS — H25013 Cortical age-related cataract, bilateral: Secondary | ICD-10-CM | POA: Diagnosis not present

## 2019-06-10 DIAGNOSIS — H5203 Hypermetropia, bilateral: Secondary | ICD-10-CM | POA: Diagnosis not present

## 2019-07-04 DIAGNOSIS — H2513 Age-related nuclear cataract, bilateral: Secondary | ICD-10-CM | POA: Diagnosis not present

## 2019-07-04 DIAGNOSIS — E119 Type 2 diabetes mellitus without complications: Secondary | ICD-10-CM | POA: Diagnosis not present

## 2019-07-04 DIAGNOSIS — H2511 Age-related nuclear cataract, right eye: Secondary | ICD-10-CM | POA: Diagnosis not present

## 2019-07-04 DIAGNOSIS — H25043 Posterior subcapsular polar age-related cataract, bilateral: Secondary | ICD-10-CM | POA: Diagnosis not present

## 2019-07-04 DIAGNOSIS — H25013 Cortical age-related cataract, bilateral: Secondary | ICD-10-CM | POA: Diagnosis not present

## 2019-08-04 DIAGNOSIS — H2511 Age-related nuclear cataract, right eye: Secondary | ICD-10-CM | POA: Diagnosis not present

## 2019-08-05 DIAGNOSIS — H2512 Age-related nuclear cataract, left eye: Secondary | ICD-10-CM | POA: Diagnosis not present

## 2019-09-01 DIAGNOSIS — H2512 Age-related nuclear cataract, left eye: Secondary | ICD-10-CM | POA: Diagnosis not present

## 2019-09-22 DIAGNOSIS — E119 Type 2 diabetes mellitus without complications: Secondary | ICD-10-CM | POA: Diagnosis not present

## 2019-09-22 DIAGNOSIS — Z794 Long term (current) use of insulin: Secondary | ICD-10-CM | POA: Diagnosis not present

## 2019-11-04 ENCOUNTER — Other Ambulatory Visit: Payer: Self-pay | Admitting: Internal Medicine

## 2019-11-25 ENCOUNTER — Other Ambulatory Visit (HOSPITAL_BASED_OUTPATIENT_CLINIC_OR_DEPARTMENT_OTHER): Payer: Self-pay | Admitting: Internal Medicine

## 2019-11-25 DIAGNOSIS — Z1231 Encounter for screening mammogram for malignant neoplasm of breast: Secondary | ICD-10-CM

## 2019-12-10 ENCOUNTER — Other Ambulatory Visit: Payer: Self-pay

## 2019-12-18 ENCOUNTER — Telehealth: Payer: Medicare Other | Admitting: Internal Medicine

## 2019-12-18 ENCOUNTER — Other Ambulatory Visit: Payer: Self-pay

## 2019-12-18 DIAGNOSIS — Z20822 Contact with and (suspected) exposure to covid-19: Secondary | ICD-10-CM | POA: Diagnosis not present

## 2019-12-18 DIAGNOSIS — J0191 Acute recurrent sinusitis, unspecified: Secondary | ICD-10-CM | POA: Diagnosis not present

## 2020-01-12 ENCOUNTER — Ambulatory Visit (INDEPENDENT_AMBULATORY_CARE_PROVIDER_SITE_OTHER): Payer: Medicare PPO

## 2020-01-12 DIAGNOSIS — Z23 Encounter for immunization: Secondary | ICD-10-CM

## 2020-01-12 MED ORDER — COVID-19 MRNA VACC (MODERNA) 100 MCG/0.5ML IM SUSP
0.2500 mL | Freq: Once | INTRAMUSCULAR | Status: AC
Start: 2020-01-12 — End: 2020-01-12
  Administered 2020-01-12: 0.25 mL via INTRAMUSCULAR

## 2020-01-12 NOTE — Progress Notes (Signed)
1.  Are you feeling sick today?    NO      2. Have you ever received a dose of COVID-19 vaccine?    YES    If yes, which vaccine product?    Moderna    3.  Have you  ever had a severe allergic reaction     (e.g., anaphylaxis) to something?  For example, a reaction for     which you were treated with epinephrine or Epi Pen®  or     for which you had to go to the hospital?       no    · Was the severe allergic reaction after receiving a COVID-19 vacci     NO    ·   Was the severe allergic reaction after receiving another vaccine or another injectable medication?    no     4. Have you received passive antibody treatment     (monoclonal antibodies or convalescent serum)      as a treatment for COVID-19?     NO    5. Do you have a weakened immune system caused     by something such as HIV infection or cancer, or do     you take immunosuppressive drugs or therapies?     NO    6. Do you have a bleeding disorder or are you taking a     blood thinner?     NO    7. Are you pregnant or breastfeeding?    No  Vaccine Screening Questions    Interpreter: No    1. Are you allergic to Latex? NO    2.  Have you had a serious reaction or an allergic reaction to a vaccine?  NO    3.  Currently have a moderate or severe illness, including fever?  NO    4.  Ever had a seizure or any neurological problem associated with a vaccine? (DTaP/TDaP/DTP pertinent) NO    5.  Is patient receiving any live vaccinations today? (Varicella-Chickenpox, MMR-Measles/Mumps/Rubella, Zoster-Shingles, Flumist, Yellow Fever) NOTE: oral rotavirus is exempt  NO    If YES to any of the questions above - Do NOT give vaccine.  Consult with RN or provider in clinic.  (#5 can be YES if all Live vaccine questions are answered NO)    If NO to all questions above - Patient may receive vaccine.    6. Do you need to receive the Flu vaccine today? NO      All patients are encouraged to wait 15 minutes before leaving after receiving any vaccine.    VIS given 01/12/2020  by Maxon Kresse A Copelyn Widmer, CMA.    Vaccine given today without initial adverse effect. YES    Norlan Rann A Alvah Lagrow, CMA

## 2020-01-26 ENCOUNTER — Encounter (HOSPITAL_BASED_OUTPATIENT_CLINIC_OR_DEPARTMENT_OTHER): Payer: Self-pay

## 2020-01-27 ENCOUNTER — Encounter (HOSPITAL_BASED_OUTPATIENT_CLINIC_OR_DEPARTMENT_OTHER): Payer: Self-pay

## 2020-01-27 ENCOUNTER — Ambulatory Visit
Admission: RE | Admit: 2020-01-27 | Discharge: 2020-01-27 | Disposition: A | Discharge status: Home / Self Care | Payer: Medicare PPO | Attending: Diagnostic Radiology | Admitting: Diagnostic Radiology

## 2020-01-27 DIAGNOSIS — Z1231 Encounter for screening mammogram for malignant neoplasm of breast: Secondary | ICD-10-CM | POA: Insufficient documentation

## 2020-03-09 ENCOUNTER — Other Ambulatory Visit: Payer: Self-pay

## 2020-03-09 ENCOUNTER — Ambulatory Visit (INDEPENDENT_AMBULATORY_CARE_PROVIDER_SITE_OTHER): Payer: HMO | Admitting: Internal Medicine

## 2020-03-09 ENCOUNTER — Ambulatory Visit: Payer: Medicare Other | Admitting: Internal Medicine

## 2020-03-09 ENCOUNTER — Encounter: Payer: Self-pay | Admitting: Internal Medicine

## 2020-03-09 VITALS — BP 142/84 | HR 87 | Temp 98.6°F | Wt 186.0 lb

## 2020-03-09 DIAGNOSIS — I10 Essential (primary) hypertension: Secondary | ICD-10-CM | POA: Diagnosis not present

## 2020-03-09 DIAGNOSIS — I2510 Atherosclerotic heart disease of native coronary artery without angina pectoris: Secondary | ICD-10-CM | POA: Diagnosis not present

## 2020-03-09 DIAGNOSIS — E782 Mixed hyperlipidemia: Secondary | ICD-10-CM

## 2020-03-09 DIAGNOSIS — J019 Acute sinusitis, unspecified: Secondary | ICD-10-CM | POA: Insufficient documentation

## 2020-03-09 DIAGNOSIS — E119 Type 2 diabetes mellitus without complications: Secondary | ICD-10-CM

## 2020-03-09 DIAGNOSIS — Z23 Encounter for immunization: Secondary | ICD-10-CM

## 2020-03-09 DIAGNOSIS — J0100 Acute maxillary sinusitis, unspecified: Secondary | ICD-10-CM | POA: Diagnosis not present

## 2020-03-09 DIAGNOSIS — R229 Localized swelling, mass and lump, unspecified: Secondary | ICD-10-CM | POA: Diagnosis not present

## 2020-03-09 DIAGNOSIS — Z794 Long term (current) use of insulin: Secondary | ICD-10-CM | POA: Diagnosis not present

## 2020-03-09 DIAGNOSIS — I2583 Coronary atherosclerosis due to lipid rich plaque: Secondary | ICD-10-CM | POA: Diagnosis not present

## 2020-03-09 MED ORDER — AZITHROMYCIN 250 MG PO TABS: ORAL_TABLET | ORAL | 0 refills | Status: DC

## 2020-03-09 MED ORDER — ALPRAZOLAM 1 MG PO TABS: 1.0000 mg | ORAL_TABLET | Freq: Two times a day (BID) | ORAL | 1 refills | Status: DC | PRN

## 2020-03-09 NOTE — Assessment & Plan Note (Addendum)
coronary calcium score is 117 On Krill oil, Pravastatin, ASA

## 2020-03-09 NOTE — Assessment & Plan Note (Signed)
On Lisinopril

## 2020-03-09 NOTE — Assessment & Plan Note (Signed)
L post arm sq nodule 1 cm Derm ref

## 2020-03-09 NOTE — Assessment & Plan Note (Addendum)
coronary calcium score is 117 Diab appt on Feb 2 w/A1c

## 2020-03-09 NOTE — Progress Notes (Signed)
Subjective:  Patient ID: Catherine Mora, female    DOB: [birth year]  Age: 70 y.o. MRN: [id number]  CC: Medication Refill (Req refill on her alprazolam)   HPI [name] presents for HTN, DM, CAD C/o sinusitis sx's C/o L arm lesion   Outpatient Medications Prior to Visit  Medication Sig Dispense Refill  . ALPRAZolam (XANAX) 1 MG tablet TAKE 1 TABLET (1 MG TOTAL) BY MOUTH 2 (TWO) TIMES DAILY AS NEEDED FOR ANXIETY. 60 tablet 1  . aspirin EC 81 MG tablet Take 1 tablet (81 mg total) by mouth daily. (Patient not taking: Reported on 03/27/2019) 100 tablet 3  . BIOTIN FORTE PO Take 500 mg by mouth daily.    . bisacodyl (DULCOLAX) 5 MG EC tablet Take 1 tablet (5 mg total) by mouth daily as needed for moderate constipation. 30 tablet 3  . CINNAMON PO Take 1,000 mg by mouth.    . fluticasone (FLONASE) 50 MCG/ACT nasal spray Place 2 sprays into both nostrils daily. (Patient not taking: Reported on 03/13/2019) 16 g 12  . glimepiride (AMARYL) 1 MG tablet Take 1 mg by mouth daily with breakfast.    . hydrocortisone 2.5 % cream Apply topically 2 (two) times daily. (Patient not taking: Reported on 03/13/2019) 30 g 0  . ibuprofen (ADVIL,MOTRIN) 200 MG tablet Take 400 mg by mouth every 6 (six) hours as needed for headache.    . insulin aspart (NOVOLOG) 100 UNIT/ML FlexPen Take as directed per sliding scale.  Max daily dose: 50 units/day.    . Insulin Detemir (LEVEMIR FLEXPEN) 100 UNIT/ML Pen Inject 16 Units into the skin daily with breakfast. (Patient taking differently: Inject 26 Units into the skin daily at 10 pm. ) 15 mL 2  . Insulin Pen Needle 31G X 5 MM MISC Use with Levemir pen 100 each 0  . Krill Oil 1000 MG CAPS Take 1,000 mg by mouth daily.    Marland Kitchen lisinopril (ZESTRIL) 5 MG tablet Take 5 mg by mouth daily.    . Medium Chain Triglycerides (MCT OIL PO) Take by mouth. In coffee for constipation    . neomycin-polymyxin b-dexamethasone (MAXITROL) 3.5-10000-0.1 SUSP Place 1 drop into the left eye 4  (four) times daily.    Marland Kitchen NOVOLOG FLEXPEN 100 UNIT/ML FlexPen TAKE AS DIRECTED PER SLIDING SCALE. MAX DAILY DOSE: 50 UNITS/DAY.  3  . ONETOUCH VERIO test strip 1 each by Other route 2 (two) times daily.     Vladimir Faster Glycol-Propyl Glycol (SYSTANE FREE OP) Apply to eye.    . pravastatin (PRAVACHOL) 20 MG tablet Take 1 tablet (20 mg total) by mouth daily. 30 tablet 11   No facility-administered medications prior to visit.    ROS: Review of Systems  Constitutional: Negative for activity change, appetite change, chills, fatigue and unexpected weight change.  HENT: Positive for congestion, postnasal drip, rhinorrhea and sinus pain. Negative for mouth sores and sinus pressure.   Eyes: Negative for visual disturbance.  Respiratory: Negative for cough and chest tightness.   Gastrointestinal: Negative for abdominal pain and nausea.  Genitourinary: Negative for difficulty urinating, frequency and vaginal pain.  Musculoskeletal: Negative for back pain and gait problem.  Skin: Negative for pallor and rash.  Neurological: Negative for dizziness, tremors, weakness, numbness and headaches.  Psychiatric/Behavioral: Negative for confusion and sleep disturbance.    Objective:  BP (!) 142/84 (BP Location: Left Arm)   Pulse 87   Temp 98.6 F (37 C) (Oral)   Wt 186 lb (84.4  kg)   SpO2 98%   BMI 26.69 kg/m   BP Readings from Last 3 Encounters:  03/09/20 (!) 142/84  03/27/19 128/74  03/04/19 132/84    Wt Readings from Last 3 Encounters:  03/09/20 186 lb (84.4 kg)  03/27/19 189 lb (85.7 kg)  03/13/19 189 lb (85.7 kg)    Physical Exam Constitutional:      General: She is not in acute distress.    Appearance: She is well-developed.  HENT:     Head: Normocephalic.     Right Ear: External ear normal.     Left Ear: External ear normal.     Nose: Nose normal.     Mouth/Throat:     Mouth: Oropharynx is clear and moist.  Eyes:     General:        Right eye: No discharge.        Left eye:  No discharge.     Conjunctiva/sclera: Conjunctivae normal.     Pupils: Pupils are equal, round, and reactive to light.  Neck:     Thyroid: No thyromegaly.     Vascular: No JVD.     Trachea: No tracheal deviation.  Cardiovascular:     Rate and Rhythm: Normal rate and regular rhythm.     Heart sounds: Normal heart sounds.  Pulmonary:     Effort: No respiratory distress.     Breath sounds: No stridor. No wheezing.  Abdominal:     General: Bowel sounds are normal. There is no distension.     Palpations: Abdomen is soft. There is no mass.     Tenderness: There is no abdominal tenderness. There is no guarding or rebound.  Musculoskeletal:        General: No tenderness or edema.     Cervical back: Normal range of motion and neck supple.  Lymphadenopathy:     Cervical: No cervical adenopathy.  Skin:    Findings: No erythema or rash.  Neurological:     Mental Status: She is oriented to person, place, and time.     Cranial Nerves: No cranial nerve deficit.     Motor: No abnormal muscle tone.     Coordination: Coordination normal.     Deep Tendon Reflexes: Reflexes normal.  Psychiatric:        Mood and Affect: Mood and affect normal.        Behavior: Behavior normal.        Thought Content: Thought content normal.        Judgment: Judgment normal.   eryth nares L post arm sq nodule  Lab Results  Component Value Date   WBC 6.4 03/04/2019   HGB 13.3 03/04/2019   HCT 40.6 03/04/2019   PLT 318.0 03/04/2019   GLUCOSE 124 (H) 03/04/2019   CHOL 266 (H) 03/04/2019   TRIG 149.0 03/04/2019   HDL 49.80 03/04/2019   LDLDIRECT 219.0 08/14/2014   LDLCALC 186 (H) 03/04/2019   ALT 15 03/04/2019   AST 19 03/04/2019   NA 138 03/04/2019   K 5.1 03/04/2019   CL 104 03/04/2019   CREATININE 0.94 03/04/2019   BUN 15 03/04/2019   CO2 29 03/04/2019   TSH 2.61 03/04/2019   HGBA1C 7.7 (H) 10/03/2016   MICROALBUR 5.7 (H) 03/04/2019    CT CARDIAC SCORING  Addendum Date: 03/11/2019    ADDENDUM REPORT: 03/11/2019 22:25 CLINICAL DATA:  Risk stratification EXAM: Coronary Calcium Score TECHNIQUE: The patient was scanned on a [employer] scanner. Axial non-contrast 3 mm  slices were carried out through the heart. The data set was analyzed on a dedicated work station and scored using the [place]. FINDINGS: Non-cardiac: See separate report from [facility]. Ascending Aorta:Normal sized. There is mild calcification at the aortic root. Pericardium: Normal Coronary arteries: Normal Origin. IMPRESSION: Coronary calcium score of 117 . This was 61 percentile for age and sex matched control. Kardie Tobb,DO Electronically Signed   By: Berniece Salines MD   On: 03/11/2019 22:25   Result Date: 03/11/2019 EXAM: OVER-READ INTERPRETATION  CT CHEST The following report is an over-read performed by radiologist Dr. Vinnie Langton of [facility], [place] on 03/11/2019. This over-read does not include interpretation of cardiac or coronary anatomy or pathology. The coronary calcium score interpretation by the cardiologist is attached. COMPARISON:  None. FINDINGS: Aortic atherosclerosis. Within the visualized portions of the thorax there are no suspicious appearing pulmonary nodules or masses, there is no acute consolidative airspace disease, no pleural effusions, no pneumothorax and no lymphadenopathy. Visualized portions of the upper abdomen are unremarkable. There are no aggressive appearing lytic or blastic lesions noted in the visualized portions of the skeleton. IMPRESSION: 1.  Aortic Atherosclerosis (ICD10-I70.0). Electronically Signed: By: Vinnie Langton M.D. On: 03/11/2019 16:06    Assessment & Plan:   There are no diagnoses linked to this encounter.   No orders of the defined types were placed in this encounter.    Follow-up: No follow-ups on file.  Walker Kehr, MD

## 2020-03-09 NOTE — Assessment & Plan Note (Signed)
Z pac prescribed.

## 2020-03-24 DIAGNOSIS — E119 Type 2 diabetes mellitus without complications: Secondary | ICD-10-CM | POA: Diagnosis not present

## 2020-03-24 DIAGNOSIS — Z794 Long term (current) use of insulin: Secondary | ICD-10-CM | POA: Diagnosis not present

## 2020-04-08 DIAGNOSIS — R1314 Dysphagia, pharyngoesophageal phase: Secondary | ICD-10-CM | POA: Diagnosis not present

## 2020-04-08 DIAGNOSIS — J029 Acute pharyngitis, unspecified: Secondary | ICD-10-CM | POA: Diagnosis not present

## 2020-04-08 DIAGNOSIS — J343 Hypertrophy of nasal turbinates: Secondary | ICD-10-CM | POA: Diagnosis not present

## 2020-04-08 DIAGNOSIS — J3089 Other allergic rhinitis: Secondary | ICD-10-CM | POA: Diagnosis not present

## 2020-04-12 ENCOUNTER — Other Ambulatory Visit: Payer: Self-pay | Admitting: Otolaryngology

## 2020-04-12 DIAGNOSIS — R1314 Dysphagia, pharyngoesophageal phase: Secondary | ICD-10-CM

## 2020-04-16 ENCOUNTER — Other Ambulatory Visit: Payer: Self-pay

## 2020-04-16 ENCOUNTER — Ambulatory Visit
Admission: RE | Admit: 2020-04-16 | Discharge: 2020-04-16 | Disposition: A | Discharge status: Home / Self Care | Payer: HMO | Source: Ambulatory Visit | Attending: Otolaryngology | Admitting: Otolaryngology

## 2020-04-16 DIAGNOSIS — R1314 Dysphagia, pharyngoesophageal phase: Secondary | ICD-10-CM

## 2020-04-16 DIAGNOSIS — K219 Gastro-esophageal reflux disease without esophagitis: Secondary | ICD-10-CM | POA: Diagnosis not present

## 2020-04-21 ENCOUNTER — Other Ambulatory Visit: Payer: Self-pay

## 2020-04-21 ENCOUNTER — Ambulatory Visit (INDEPENDENT_AMBULATORY_CARE_PROVIDER_SITE_OTHER): Payer: HMO

## 2020-04-21 VITALS — BP 120/70 | HR 70 | Temp 97.5°F | Resp 16 | Ht 70.0 in | Wt 183.4 lb

## 2020-04-21 DIAGNOSIS — Z0000 Encounter for general adult medical examination without abnormal findings: Secondary | ICD-10-CM | POA: Diagnosis not present

## 2020-04-21 NOTE — Patient Instructions (Addendum)
Catherine Mora , Thank you for taking time to come for your Medicare Wellness Visit. I appreciate your ongoing commitment to your health goals. Please review the following plan we discussed and let me know if I can assist you in the future.   Screening recommendations/referrals: Colonoscopy: 03/27/2019; due every 7 years Mammogram: 11/14/2018 per [facility] Bone Density: never done Recommended yearly ophthalmology/optometry visit for glaucoma screening and checkup Recommended yearly dental visit for hygiene and checkup  Vaccinations: Influenza vaccine: 03/09/2020 Pneumococcal vaccine: 09/28/2015, 10/03/2016 Tdap vaccine: 10/10/2012; due every 10 years Shingles vaccine: declined Covid-19: declined  Advanced directives: Advance directive discussed with you today. Even though you declined this today please call our office should you change your mind and we can give you the proper paperwork for you to fill out.  Conditions/risks identified: Yes; Reviewed health maintenance screenings with patient today and relevant education, vaccines, and/or referrals were provided. Please continue to do your personal lifestyle choices by: daily care of teeth and gums, regular physical activity (goal should be 5 days a week for 30 minutes), eat a healthy diet, avoid tobacco and drug use, limiting any alcohol intake, taking a low-dose aspirin (if not allergic or have been advised by your provider otherwise) and taking vitamins and minerals as recommended by your provider. Continue doing brain stimulating activities (puzzles, reading, adult coloring books, staying active) to keep memory sharp. Continue to eat heart healthy diet (full of fruits, vegetables, whole grains, lean protein, water--limit salt, fat, and sugar intake) and increase physical activity as tolerated.  Next appointment: Please schedule your next Medicare Wellness Visit with your Nurse Health Advisor in 1 year by calling [phone].   Preventive  Care 70 Years and Older, Female Preventive care refers to lifestyle choices and visits with your health care provider that can promote health and wellness. What does preventive care include?  A yearly physical exam. This is also called an annual well check.  Dental exams once or twice a year.  Routine eye exams. Ask your health care provider how often you should have your eyes checked.  Personal lifestyle choices, including:  Daily care of your teeth and gums.  Regular physical activity.  Eating a healthy diet.  Avoiding tobacco and drug use.  Limiting alcohol use.  Practicing safe sex.  Taking low-dose aspirin every day.  Taking vitamin and mineral supplements as recommended by your health care provider. What happens during an annual well check? The services and screenings done by your health care provider during your annual well check will depend on your age, overall health, lifestyle risk factors, and family history of disease. Counseling  Your health care provider may ask you questions about your:  Alcohol use.  Tobacco use.  Drug use.  Emotional well-being.  Home and relationship well-being.  Sexual activity.  Eating habits.  History of falls.  Memory and ability to understand (cognition).  Work and work Statistician.  Reproductive health. Screening  You may have the following tests or measurements:  Height, weight, and BMI.  Blood pressure.  Lipid and cholesterol levels. These may be checked every 5 years, or more frequently if you are over 30 years old.  Skin check.  Lung cancer screening. You may have this screening every year starting at age 26 if you have a 30-pack-year history of smoking and currently smoke or have quit within the past 15 years.  Fecal occult blood test (FOBT) of the stool. You may have this test every year starting at age  70.  Flexible sigmoidoscopy or colonoscopy. You may have a sigmoidoscopy every 5 years or a  colonoscopy every 10 years starting at age 70.  Hepatitis C blood test.  Hepatitis B blood test.  Sexually transmitted disease (STD) testing.  Diabetes screening. This is done by checking your blood sugar (glucose) after you have not eaten for a while (fasting). You may have this done every 1-3 years.  Bone density scan. This is done to screen for osteoporosis. You may have this done starting at age 70.  Mammogram. This may be done every 1-2 years. Talk to your health care provider about how often you should have regular mammograms. Talk with your health care provider about your test results, treatment options, and if necessary, the need for more tests. Vaccines  Your health care provider may recommend certain vaccines, such as:  Influenza vaccine. This is recommended every year.  Tetanus, diphtheria, and acellular pertussis (Tdap, Td) vaccine. You may need a Td booster every 10 years.  Zoster vaccine. You may need this after age 70.  Pneumococcal 13-valent conjugate (PCV13) vaccine. One dose is recommended after age 70.  Pneumococcal polysaccharide (PPSV23) vaccine. One dose is recommended after age 70. Talk to your health care provider about which screenings and vaccines you need and how often you need them. This information is not intended to replace advice given to you by your health care provider. Make sure you discuss any questions you have with your health care provider. Document Released: 03/05/2015 Document Revised: 10/27/2015 Document Reviewed: 12/08/2014 Elsevier Interactive Patient Education  2017 [place] Prevention in the Home Falls can cause injuries. They can happen to people of all ages. There are many things you can do to make your home safe and to help prevent falls. What can I do on the outside of my home?  Regularly fix the edges of walkways and driveways and fix any cracks.  Remove anything that might make you trip as you walk through a door,  such as a raised step or threshold.  Trim any bushes or trees on the path to your home.  Use bright outdoor lighting.  Clear any walking paths of anything that might make someone trip, such as rocks or tools.  Regularly check to see if handrails are loose or broken. Make sure that both sides of any steps have handrails.  Any raised decks and porches should have guardrails on the edges.  Have any leaves, snow, or ice cleared regularly.  Use sand or salt on walking paths during winter.  Clean up any spills in your garage right away. This includes oil or grease spills. What can I do in the bathroom?  Use night lights.  Install grab bars by the toilet and in the tub and shower. Do not use towel bars as grab bars.  Use non-skid mats or decals in the tub or shower.  If you need to sit down in the shower, use a plastic, non-slip stool.  Keep the floor dry. Clean up any water that spills on the floor as soon as it happens.  Remove soap buildup in the tub or shower regularly.  Attach bath mats securely with double-sided non-slip rug tape.  Do not have throw rugs and other things on the floor that can make you trip. What can I do in the bedroom?  Use night lights.  Make sure that you have a light by your bed that is easy to reach.  Do not use any sheets  or blankets that are too big for your bed. They should not hang down onto the floor.  Have a firm chair that has side arms. You can use this for support while you get dressed.  Do not have throw rugs and other things on the floor that can make you trip. What can I do in the kitchen?  Clean up any spills right away.  Avoid walking on wet floors.  Keep items that you use a lot in easy-to-reach places.  If you need to reach something above you, use a strong step stool that has a grab bar.  Keep electrical cords out of the way.  Do not use floor polish or wax that makes floors slippery. If you must use wax, use non-skid  floor wax.  Do not have throw rugs and other things on the floor that can make you trip. What can I do with my stairs?  Do not leave any items on the stairs.  Make sure that there are handrails on both sides of the stairs and use them. Fix handrails that are broken or loose. Make sure that handrails are as long as the stairways.  Check any carpeting to make sure that it is firmly attached to the stairs. Fix any carpet that is loose or worn.  Avoid having throw rugs at the top or bottom of the stairs. If you do have throw rugs, attach them to the floor with carpet tape.  Make sure that you have a light switch at the top of the stairs and the bottom of the stairs. If you do not have them, ask someone to add them for you. What else can I do to help prevent falls?  Wear shoes that:  Do not have high heels.  Have rubber bottoms.  Are comfortable and fit you well.  Are closed at the toe. Do not wear sandals.  If you use a stepladder:  Make sure that it is fully opened. Do not climb a closed stepladder.  Make sure that both sides of the stepladder are locked into place.  Ask someone to hold it for you, if possible.  Clearly mark and make sure that you can see:  Any grab bars or handrails.  First and last steps.  Where the edge of each step is.  Use tools that help you move around (mobility aids) if they are needed. These include:  Canes.  Walkers.  Scooters.  Crutches.  Turn on the lights when you go into a dark area. Replace any light bulbs as soon as they burn out.  Set up your furniture so you have a clear path. Avoid moving your furniture around.  If any of your floors are uneven, fix them.  If there are any pets around you, be aware of where they are.  Review your medicines with your doctor. Some medicines can make you feel dizzy. This can increase your chance of falling. Ask your doctor what other things that you can do to help prevent falls. This  information is not intended to replace advice given to you by your health care provider. Make sure you discuss any questions you have with your health care provider. Document Released: 12/03/2008 Document Revised: 07/15/2015 Document Reviewed: 03/13/2014 Elsevier Interactive Patient Education  2017 [employer].

## 2020-04-21 NOTE — Progress Notes (Addendum)
Subjective:   Catherine Mora is a 70 y.o. female who presents for Medicare Annual (Subsequent) preventive examination.  Review of Systems    No ROS. Medicare Wellness Visit. Additional risk factors are reflected in social history.  Cardiac Risk Factors include: advanced age (>43men, >66 women);diabetes mellitus;dyslipidemia     Objective:    Today's Vitals   04/21/20 1224  BP: 120/70  Pulse: 70  Resp: 16  Temp: (!) 97.5 F (36.4 C)  SpO2: 94%  Weight: 183 lb 6.4 oz (83.2 kg)  Height: 5\' 10"  (1.778 m)  PainSc: 0-No pain   Body mass index is 26.32 kg/m.  Advanced Directives 04/21/2020 01/19/2016 11/16/2015 08/17/2014 07/05/2012  Does Patient Have a Medical Advance Directive? No No No No -  Would patient like information on creating a medical advance directive? No - Patient declined - - No - patient declined information -  Pre-existing out of facility DNR order (yellow form or pink MOST form) - - - - No    Current Medications (verified) Outpatient Encounter Medications as of 04/21/2020  Medication Sig   ALPRAZolam (XANAX) 1 MG tablet Take 1 tablet (1 mg total) by mouth 2 (two) times daily as needed for anxiety.   BIOTIN FORTE PO Take 500 mg by mouth daily.   bisacodyl (DULCOLAX) 5 MG EC tablet Take 1 tablet (5 mg total) by mouth daily as needed for moderate constipation.   glimepiride (AMARYL) 1 MG tablet Take 1 mg by mouth daily with breakfast.   ibuprofen (ADVIL,MOTRIN) 200 MG tablet Take 400 mg by mouth every 6 (six) hours as needed for headache.   insulin aspart (NOVOLOG) 100 UNIT/ML FlexPen Take as directed per sliding scale.  Max daily dose: 50 units/day.   Insulin Detemir (LEVEMIR FLEXPEN) 100 UNIT/ML Pen Inject 16 Units into the skin daily with breakfast. (Patient taking differently: Inject 26 Units into the skin daily at 10 pm. 16 units in the morning and 16 units in the evening)   Insulin Pen Needle 31G X 5 MM MISC Use with Levemir pen   Krill Oil 1000 MG CAPS Take  1,000 mg by mouth daily.   lisinopril (ZESTRIL) 5 MG tablet Take 5 mg by mouth daily.   NOVOLOG FLEXPEN 100 UNIT/ML FlexPen TAKE AS DIRECTED PER SLIDING SCALE. MAX DAILY DOSE: 50 UNITS/DAY.   ONETOUCH VERIO test strip 1 each by Other route 2 (two) times daily.    Polyethyl Glycol-Propyl Glycol (SYSTANE FREE OP) Apply to eye.   pravastatin (PRAVACHOL) 20 MG tablet Take 1 tablet (20 mg total) by mouth daily.   azithromycin (ZITHROMAX Z-PAK) 250 MG tablet As directed (Patient not taking: Reported on 04/21/2020)   CINNAMON PO Take 1,000 mg by mouth. (Patient not taking: Reported on 04/21/2020)   fluticasone (FLONASE) 50 MCG/ACT nasal spray Place 2 sprays into both nostrils daily. (Patient not taking: No sig reported)   hydrocortisone 2.5 % cream Apply topically 2 (two) times daily. (Patient not taking: No sig reported)   Medium Chain Triglycerides (MCT OIL PO) Take by mouth. In coffee for constipation (Patient not taking: Reported on 04/21/2020)   neomycin-polymyxin b-dexamethasone (MAXITROL) 3.5-10000-0.1 SUSP Place 1 drop into the left eye 4 (four) times daily. (Patient not taking: Reported on 04/21/2020)   [DISCONTINUED] Pitavastatin Calcium 1 MG TABS Take 1 tablet (1 mg total) by mouth daily.   No facility-administered encounter medications on file as of 04/21/2020.    Allergies (verified) Metformin, Ciprofloxacin, Crestor [rosuvastatin calcium], Hydrochlorothiazide, Invokana [canagliflozin], Paroxetine,  Penicillins, and Simvastatin   History: Past Medical History:  Diagnosis Date   Allergy    Anxiety    Cataract    starting, no surgery yet   Cholelithiasis 2011   Depression    Diabetes mellitus without complication (HCC)    DKA (diabetic ketoacidoses)    several years ago and in hospital x 1 week    GERD (gastroesophageal reflux disease)    Glucose intolerance (impaired glucose tolerance)    HTN (hypertension)    Hyperlipidemia    IBS (irritable bowel syndrome)    PONV (postoperative  nausea and vomiting)    Past Surgical History:  Procedure Laterality Date   ABDOMINAL HYSTERECTOMY     CHOLECYSTECTOMY N/A 07/03/2012   Procedure: LAPAROSCOPIC CHOLECYSTECTOMY WITH INTRAOPERATIVE CHOLANGIOGRAM;  Surgeon: Zenovia Jarred, MD;  Location: [facility];  Service: General;  Laterality: N/A;   COLONOSCOPY     cyst removed     x3   OOPHORECTOMY     POLYPECTOMY     Family History  Problem Relation Age of Onset   Diabetes Mother    Diabetes Sister    Diabetes Brother    Diabetes Other    Stomach cancer Paternal Uncle    Heart disease Neg Hx    Colon cancer Neg Hx    Esophageal cancer Neg Hx    Rectal cancer Neg Hx    Colon polyps Neg Hx    Social History   Socioeconomic History   Marital status: Married    Spouse name: Not on file   Number of children: 1   Years of education: Not on file   Highest education level: Not on file  Occupational History   Occupation: [employer]    Comment: Wks w/husband  Tobacco Use   Smoking status: Never Smoker   Smokeless tobacco: Never Used  Substance and Sexual Activity   Alcohol use: Yes    Comment: very little   Drug use: No   Sexual activity: Yes  Other Topics Concern   Not on file  Social History Narrative   Not on file   Social Determinants of Health   Financial Resource Strain: Low Risk    Difficulty of Paying Living Expenses: Not hard at all  Food Insecurity: No Food Insecurity   Worried About Charity fundraiser in the Last Year: Never true   [place] in the Last Year: Never true  Transportation Needs: No Transportation Needs   Lack of Transportation (Medical): No   Lack of Transportation (Non-Medical): No  Physical Activity: Sufficiently Active   Days of Exercise per Week: 7 days   Minutes of Exercise per Session: 30 min  Stress: No Stress Concern Present   Feeling of Stress : Not at all  Social Connections: Socially Integrated   Frequency of Communication with Friends and Family: More than three  times a week   Frequency of Social Gatherings with Friends and Family: More than three times a week   Attends Religious Services: More than 4 times per year   Active Member of [employer] or Organizations: Yes   Attends Music therapist: More than 4 times per year   Marital Status: Married    Tobacco Counseling Counseling given: Not Answered   Clinical Intake:  Pre-visit preparation completed: Yes  Pain : No/denies pain Pain Score: 0-No pain     BMI - recorded: 26.32 Nutritional Status: BMI 25 -29 Overweight Nutritional Risks: None Diabetes: No  How often do  you need to have someone help you when you read instructions, pamphlets, or other written materials from your doctor or pharmacy?: 1 - Never What is the last grade level you completed in school?: HSG; 2 years community college  Diabetic? yes  Interpreter Needed?: No  Information entered by :: Lisette Abu, LPN   Activities of Daily Living In your present state of health, do you have any difficulty performing the following activities: 04/21/2020  Hearing? N  Vision? N  Difficulty concentrating or making decisions? N  Walking or climbing stairs? N  Dressing or bathing? N  Doing errands, shopping? N  Preparing Food and eating ? N  Using the Toilet? N  In the past six months, have you accidently leaked urine? N  Do you have problems with loss of bowel control? N  Managing your Medications? N  Managing your Finances? N  Housekeeping or managing your Housekeeping? N  Some recent data might be hidden    Patient Care Team: Plotnikov, Evie Lacks, MD as PCP - General Louretta Shorten, MD as Registered Nurse (Obstetrics and Gynecology) Amalia Greenhouse, MD as Referring Physician (Endocrinology)  Indicate any recent Medical Services you may have received from other than [facility] providers in the past year (date may be approximate).     Assessment:   This is a routine wellness examination for Taya.  Hearing/Vision  screen No exam data present  Dietary issues and exercise activities discussed: Current Exercise Habits: Home exercise routine, Type of exercise: treadmill;walking, Time (Minutes): 30, Frequency (Times/Week): 7, Weekly Exercise (Minutes/Week): 210, Intensity: Moderate, Exercise limited by: cardiac condition(s)   Depression Screen PHQ 2/9 Scores 04/21/2020 04/02/2017 02/01/2016  PHQ - 2 Score 0 0 0    Fall Risk Fall Risk  04/21/2020 04/02/2017 02/01/2016  Falls in the past year? 0 Yes No  Number falls in past yr: 0 1 -  Injury with Fall? 0 Yes -  Risk for fall due to : No Fall Risks - -  Follow up Falls evaluation completed - -    FALL RISK PREVENTION PERTAINING TO THE HOME:  Any stairs in or around the home? Yes  If so, are there any without handrails? No  Home free of loose throw rugs in walkways, pet beds, electrical cords, etc? Yes  Adequate lighting in your home to reduce risk of falls? Yes   ASSISTIVE DEVICES UTILIZED TO PREVENT FALLS:  Life alert? No  Use of a cane, walker or w/c? No  Grab bars in the bathroom? No  Shower chair or bench in shower? No  Elevated toilet seat or a handicapped toilet? No   TIMED UP AND GO:  Was the test performed? No .  Length of time to ambulate 10 feet: 0 sec.   Gait steady and fast without use of assistive device  Cognitive Function: Normal cognitive status assessed by direct observation by this Nurse Health Advisor. No abnormalities found.          Immunizations Immunization History  Administered Date(s) Administered   Fluad Quad(high Dose 65+) 03/09/2020   Influenza, High Dose Seasonal PF 12/24/2017, 10/14/2018   Pneumococcal Conjugate-13 09/28/2015   Pneumococcal Polysaccharide-23 10/03/2016   Tdap 10/10/2012    TDAP status: Up to date  Flu Vaccine status: Up to date  Pneumococcal vaccine status: Up to date  Covid-19 vaccine status: Declined, Education has been provided regarding the importance of this vaccine but  patient still declined. Advised may receive this vaccine at local pharmacy or Health Dept.or vaccine  clinic. Aware to provide a copy of the vaccination record if obtained from local pharmacy or Health Dept. Verbalized acceptance and understanding.  Qualifies for Shingles Vaccine? Yes   Zostavax completed No   Shingrix Completed?: No.    Education has been provided regarding the importance of this vaccine. Patient has been advised to call insurance company to determine out of pocket expense if they have not yet received this vaccine. Advised may also receive vaccine at local pharmacy or Health Dept. Verbalized acceptance and understanding.  Screening Tests Health Maintenance  Topic Date Due   COVID-19 Vaccine (1) Never done   FOOT EXAM  Never done   OPHTHALMOLOGY EXAM  Never done   DEXA SCAN  Never done   HEMOGLOBIN A1C  04/05/2017   MAMMOGRAM  04/07/2019   TETANUS/TDAP  10/11/2022   COLONOSCOPY (Pts 45-37yrs Insurance coverage will need to be confirmed)  03/26/2026   INFLUENZA VACCINE  Completed   Hepatitis C Screening  Completed   PNA vac Low Risk Adult  Completed   HPV VACCINES  Aged Out    Health Maintenance  Health Maintenance Due  Topic Date Due   COVID-19 Vaccine (1) Never done   FOOT EXAM  Never done   OPHTHALMOLOGY EXAM  Never done   DEXA SCAN  Never done   HEMOGLOBIN A1C  04/05/2017   MAMMOGRAM  04/07/2019    Colorectal cancer screening: Type of screening: Colonoscopy. Completed 03/27/2019. Repeat every 7 years  Mammogram status: Completed 11/14/2018. Repeat every year  Bone density status: never done  Lung Cancer Screening: (Low Dose CT Chest recommended if Age 40-80 years, 30 pack-year currently smoking OR have quit w/in 15years.) does not qualify.   Lung Cancer Screening Referral: no  Additional Screening:  Hepatitis C Screening: does qualify; Completed yes  Vision Screening: Recommended annual ophthalmology exams for early detection of glaucoma and other  disorders of the eye. Is the patient up to date with their annual eye exam?  Yes  Who is the provider or what is the name of the office in which the patient attends annual eye exams? [facility] If pt is not established with a provider, would they like to be referred to a provider to establish care? No .   Dental Screening: Recommended annual dental exams for proper oral hygiene  Community Resource Referral / Chronic Care Management: CRR required this visit?  No   CCM required this visit?  No      Plan:     I have personally reviewed and noted the following in the patient's chart:   Medical and social history Use of alcohol, tobacco or illicit drugs  Current medications and supplements Functional ability and status Nutritional status Physical activity Advanced directives List of other physicians Hospitalizations, surgeries, and ER visits in previous 12 months Vitals Screenings to include cognitive, depression, and falls Referrals and appointments  In addition, I have reviewed and discussed with patient certain preventive protocols, quality metrics, and best practice recommendations. A written personalized care plan for preventive services as well as general preventive health recommendations were provided to patient.     Sheral Flow, LPN   0/08/3708   Nurse Notes:   Medications reviewed with patient; no opioid use noted.  Medical screening examination/treatment/procedure(s) were performed by non-physician practitioner and as supervising physician I was immediately available for consultation/collaboration.  I agree with above. Lew Dawes, MD

## 2020-04-29 DIAGNOSIS — K219 Gastro-esophageal reflux disease without esophagitis: Secondary | ICD-10-CM | POA: Insufficient documentation

## 2020-04-29 DIAGNOSIS — K222 Esophageal obstruction: Secondary | ICD-10-CM | POA: Diagnosis not present

## 2020-04-29 DIAGNOSIS — R1314 Dysphagia, pharyngoesophageal phase: Secondary | ICD-10-CM | POA: Diagnosis not present

## 2020-05-12 ENCOUNTER — Other Ambulatory Visit: Payer: Self-pay

## 2020-05-12 ENCOUNTER — Ambulatory Visit: Payer: HMO | Admitting: Physician Assistant

## 2020-05-12 ENCOUNTER — Encounter: Payer: Self-pay | Admitting: Physician Assistant

## 2020-05-12 VITALS — BP 106/80 | HR 84 | Ht 70.0 in | Wt 183.1 lb

## 2020-05-12 DIAGNOSIS — K219 Gastro-esophageal reflux disease without esophagitis: Secondary | ICD-10-CM | POA: Diagnosis not present

## 2020-05-12 DIAGNOSIS — R1314 Dysphagia, pharyngoesophageal phase: Secondary | ICD-10-CM | POA: Diagnosis not present

## 2020-05-12 MED ORDER — OMEPRAZOLE 20 MG PO CPDR: 20.0000 mg | DELAYED_RELEASE_CAPSULE | Freq: Two times a day (BID) | ORAL | 2 refills | Status: DC

## 2020-05-12 NOTE — Progress Notes (Signed)
Agree with assessment and plan as outlined.  

## 2020-05-12 NOTE — Progress Notes (Signed)
Chief Complaint: Dysphagia  HPI:    Catherine Mora is a 70 year old female with a past medical history as listed below, known to Dr. Havery Moros, who was referred to me by Plotnikov, Evie Lacks, MD for a complaint of dysphagia.      03/27/2019 colonoscopy with 1 3 mm polyp at the ileocecal valve, diverticulosis in the transverse colon and internal hemorrhoids.  Pathology showed sessile serrated polyp and repeat was recommended in 7 years.    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  Barium tablet did not pass through the stricture.    04/29/2020 patient saw PCP in regards to dysphagia.  At that time was recommended she have a GI eval.    Today, patient describes that over the past few months she has been noticing that food and even sometimes liquids get stuck in her throat as she is drinking them down.  This happened very severely about 2 weeks before she had her esophagram and she felt like something was stuck for a long time.  It eventually went down but was very scary for her.  Since then she has been doing very well and eating mostly soft foods.  Associated symptoms include reflux for which she takes Tums occasionally.    Patient has 1 son and a 30-year-old grandson who is the "light of her world"    Denies fever, chills, weight loss or change in bowel habits.  Past Medical History:  Diagnosis Date  . Allergy   . Anxiety   . Cataract    starting, no surgery yet  . Cholelithiasis 2011  . Depression   . Diabetes mellitus without complication ([place])   . DKA (diabetic ketoacidoses)    several years ago and in hospital x 1 week   . GERD (gastroesophageal reflux disease)   . Glucose intolerance (impaired glucose tolerance)   . HTN (hypertension)   . Hyperlipidemia   . IBS (irritable bowel syndrome)   . PONV (postoperative nausea and vomiting)     Past Surgical History:  Procedure Laterality  Date  . ABDOMINAL HYSTERECTOMY    . CHOLECYSTECTOMY N/A 07/03/2012   Procedure: LAPAROSCOPIC CHOLECYSTECTOMY WITH INTRAOPERATIVE CHOLANGIOGRAM;  Surgeon: Zenovia Jarred, MD;  Location: [place];  Service: General;  Laterality: N/A;  . COLONOSCOPY    . cyst removed     x3  . OOPHORECTOMY    . POLYPECTOMY      Current Outpatient Medications  Medication Sig Dispense Refill  . ALPRAZolam (XANAX) 1 MG tablet Take 1 tablet (1 mg total) by mouth 2 (two) times daily as needed for anxiety. 60 tablet 1  . BIOTIN FORTE PO Take 500 mg by mouth daily.    . bisacodyl (DULCOLAX) 5 MG EC tablet Take 1 tablet (5 mg total) by mouth daily as needed for moderate constipation. 30 tablet 3  . glimepiride (AMARYL) 1 MG tablet Take 1 mg by mouth daily with breakfast.    . ibuprofen (ADVIL,MOTRIN) 200 MG tablet Take 400 mg by mouth every 6 (six) hours as needed for headache.    . insulin detemir (LEVEMIR) 100 UNIT/ML FlexPen Inject 16 Units into the skin 2 (two) times daily.    . Insulin Pen Needle 31G X 5 MM MISC Use with Levemir pen 100 each 0  . Krill Oil 1000 MG CAPS Take 1,000 mg by mouth daily.    Marland Kitchen lisinopril (ZESTRIL) 5 MG tablet Take 5  mg by mouth daily.    Marland Kitchen NOVOLOG FLEXPEN 100 UNIT/ML FlexPen TAKE AS DIRECTED PER SLIDING SCALE. MAX DAILY DOSE: 50 UNITS/DAY.  3  . ONETOUCH VERIO test strip 1 each by Other route 2 (two) times daily.     Vladimir Faster Glycol-Propyl Glycol (SYSTANE FREE OP) Apply to eye.    . pravastatin (PRAVACHOL) 20 MG tablet Take 1 tablet (20 mg total) by mouth daily. 30 tablet 11   No current facility-administered medications for this visit.    Allergies as of 05/12/2020 - Review Complete 05/12/2020  Allergen Reaction Noted  . Metformin Other (See Comments) 10/05/2015  . Ciprofloxacin Hives 05/26/2009  . Crestor [rosuvastatin calcium] Other (See Comments) 05/24/2010  . Hydrochlorothiazide Other (See Comments) 05/26/2009  . Invokana [canagliflozin] Other (See Comments) 08/27/2014   . Paroxetine Other (See Comments) 05/26/2009  . Penicillins Hives   . Simvastatin Other (See Comments) 05/21/2007    Family History  Problem Relation Age of Onset  . Diabetes Mother   . Diabetes Sister   . Diabetes Brother   . Diabetes Other   . Stomach cancer Paternal Uncle   . Heart disease Neg Hx   . Colon cancer Neg Hx   . Esophageal cancer Neg Hx   . Rectal cancer Neg Hx   . Colon polyps Neg Hx     Social History   Socioeconomic History  . Marital status: Married    Spouse name: Not on file  . Number of children: 1  . Years of education: Not on file  . Highest education level: Not on file  Occupational History  . Occupation: [employer] homes    Comment: Wks w/husband  Tobacco Use  . Smoking status: Never Smoker  . Smokeless tobacco: Never Used  Vaping Use  . Vaping Use: Never used  Substance and Sexual Activity  . Alcohol use: Yes    Comment: very little  . Drug use: No  . Sexual activity: Yes  Other Topics Concern  . Not on file  Social History Narrative  . Not on file   Social Determinants of Health   Financial Resource Strain: Low Risk   . Difficulty of Paying Living Expenses: Not hard at all  Food Insecurity: No Food Insecurity  . Worried About Charity fundraiser in the Last Year: Never true  . Ran Out of Food in the Last Year: Never true  Transportation Needs: No Transportation Needs  . Lack of Transportation (Medical): No  . Lack of Transportation (Non-Medical): No  Physical Activity: Sufficiently Active  . Days of Exercise per Week: 7 days  . Minutes of Exercise per Session: 30 min  Stress: No Stress Concern Present  . Feeling of Stress : Not at all  Social Connections: Socially Integrated  . Frequency of Communication with Friends and Family: More than three times a week  . Frequency of Social Gatherings with Friends and Family: More than three times a week  . Attends Religious Services: More than 4 times per year  . Active Member of  Clubs or Organizations: Yes  . Attends Archivist Meetings: More than 4 times per year  . Marital Status: Married  Human resources officer Violence: Not At Risk  . Fear of Current or Ex-Partner: No  . Emotionally Abused: No  . Physically Abused: No  . Sexually Abused: No    Review of Systems:    Constitutional: No weight loss, fever or chills Cardiovascular: No chest pain   Respiratory: No SOB  Gastrointestinal: See HPI and otherwise negative   Physical Exam:  Vital signs: BP 106/80 (BP Location: Right Arm, Patient Position: Sitting, Cuff Size: Normal)   Pulse 84   Ht 5\' 10"  (1.778 m)   Wt 183 lb 2 oz (83.1 kg)   BMI 26.28 kg/m   Constitutional:   Pleasant Caucasian female appears to be in NAD, Well developed, Well nourished, alert and cooperative Respiratory: Respirations even and unlabored. Lungs clear to auscultation bilaterally.   No wheezes, crackles, or rhonchi.  Cardiovascular: Normal S1, S2. No MRG. Regular rate and rhythm. No peripheral edema, cyanosis or pallor.  Gastrointestinal:  Soft, nondistended, nontender. No rebound or guarding. Normal bowel sounds. No appreciable masses or hepatomegaly. Rectal:  Not performed.  Psychiatric: Oriented to person, place and time. Demonstrates good judgement and reason without abnormal affect or behaviors.  See HPI for recent imaging.  Assessment: 1.  Dysphagia: Over the past few months, esophagram recently with stricture, hiatal hernia and reflux 2.  GERD: Frequent per patient, she typically uses Tums  Plan: 1.  Started the patient on Omeprazole 20 mg twice daily, 30-60 minutes before breakfast and dinner #60 with 5 refills.  Discussed that pending EGD Dr. Havery Moros may want to increase this. 2.  Scheduled patient for an EGD with dilation in the [place] with Dr. Havery Moros.  Did provide the patient with a detailed list of risks for the procedure and she agrees to proceed. 3.  Reviewed anti-dysphagia measures including taking  small bites, chewing well and avoiding distraction while eating. 4.  Patient to follow in clinic per recommendations from Dr. Havery Moros after time of procedure.  Ellouise Newer, PA-C [facility] 05/12/2020, 10:51 AM  Cc: Plotnikov, Evie Lacks, MD

## 2020-05-12 NOTE — Patient Instructions (Addendum)
If you are age 70 or older, your body mass index should be between 23-30. Your Body mass index is 26.28 kg/m. If this is out of the aforementioned range listed, please consider follow up with your Primary Care Provider.  If you are age 53 or younger, your body mass index should be between 19-25. Your Body mass index is 26.28 kg/m. If this is out of the aformentioned range listed, please consider follow up with your Primary Care Provider.   You have been scheduled for an endoscopy. Please follow written instructions given to you at your visit today. If you use inhalers (even only as needed), please bring them with you on the day of your procedure.  We have sent the following medications to your pharmacy for you to pick up at your convenience: Omeprazole 20 mg   Thank you for choosing me and [facility].  Ellouise Newer, PA-C

## 2020-05-19 ENCOUNTER — Ambulatory Visit (INDEPENDENT_AMBULATORY_CARE_PROVIDER_SITE_OTHER): Admit: 2020-05-19 | Discharge: 2020-05-19 | Disposition: A | Discharge status: Home / Self Care | Payer: Medicare PPO

## 2020-05-19 ENCOUNTER — Ambulatory Visit (INDEPENDENT_AMBULATORY_CARE_PROVIDER_SITE_OTHER): Payer: Medicare PPO | Admitting: Family

## 2020-05-19 VITALS — BP 104/57 | HR 82 | Temp 97.1°F | Resp 15 | Ht 67.5 in | Wt 113.0 lb

## 2020-05-19 DIAGNOSIS — M25531 Pain in right wrist: Secondary | ICD-10-CM

## 2020-05-19 NOTE — Progress Notes (Signed)
Medication reconciliation, allergies, pharmacy verified and vitals taken by Jessalyn Hinojosa, MAC, STR

## 2020-05-19 NOTE — Result Encounter Note (Signed)
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  Its made up of 3 small bones of the hand and is likely the cause of your pain.  I would encourage you to make the appointment with Sports Med to discuss treatment.       Andee Poles, ARNP  05/20/20      12:41 PM

## 2020-05-19 NOTE — Progress Notes (Addendum)
[facility] [address]  11:18 AM    Chief Complaint   Patient presents with   . Wrist Pain     > 2 mo's right cmc/posterior thumb, previous to onset chronic ache, clicking, weakness.        SUBJECTIVE:  Paula Bowman is an 70 year old person who presents with     #2 month hx of right wrist pain and intermittent weakness. Noted to drop things at times. No numbness or tingling.  Used a wrist/thumb splint which has helped.    No injuries or changes in use. Pt retired almost 6 yrs ago, Catering manager.     Review of Systems   Constitutional: Negative.    Musculoskeletal: Positive for joint pain. Negative for back pain, falls and neck pain.   Skin: Negative.        I have reviewed the pertinent past medical and social history with the patient and/or surrogate.      Review of patient's allergies indicates:  Allergies   Allergen Reactions   . Amoxicillin Skin: Rash   . Erythromycin    . Levofloxacin Skin: Itching     Redness up arm from IV dose   . Penicillin G Skin: Rash   . Penicillins Skin: Rash   . Terbinafine Hcl      Loss of sense of taste         BP (!) 104/57   Pulse 82   Temp 36.2 C (Temporal)   Resp 15   Ht 5' 7.5" (1.715 m)   Wt 51.3 kg (113 lb)   SpO2 97%   BMI 17.44 kg/m     OBJECTIVE:  Physical Exam  Constitutional:       Appearance: Normal appearance.   Musculoskeletal:      Left wrist: Tenderness (base of right thumb) present. No bony tenderness.   Skin:     General: Skin is warm.      Findings: No bruising, erythema, lesion or rash.   Neurological:      Mental Status: She is alert.              ASSESSMENT AND PLAN  1. Right wrist pain  2 months without trauma. Pain base of thumb with palpation.  - XR today- awaiting final read  - referral to sports med.  - XR Wrist 3+ View Right; Future  - Referral to Sports Medicine Surgical; Future            Paula Bowman, [place]  05/20/20      11:18 AM

## 2020-05-19 NOTE — Patient Instructions (Signed)
-   Please wear splint  - We will do XR today and I will send you results via mychart.  - Consider

## 2020-05-29 NOTE — Addendum Note (Signed)
Addended by: Marga Hoots on: 05/29/2020 09:52 AM     Modules accepted: Orders

## 2020-06-01 DIAGNOSIS — D1722 Benign lipomatous neoplasm of skin and subcutaneous tissue of left arm: Secondary | ICD-10-CM | POA: Diagnosis not present

## 2020-06-01 DIAGNOSIS — D485 Neoplasm of uncertain behavior of skin: Secondary | ICD-10-CM | POA: Diagnosis not present

## 2020-06-02 ENCOUNTER — Other Ambulatory Visit: Payer: Self-pay | Admitting: Physician Assistant

## 2020-06-17 ENCOUNTER — Ambulatory Visit (AMBULATORY_SURGERY_CENTER): Payer: HMO | Admitting: Gastroenterology

## 2020-06-17 ENCOUNTER — Other Ambulatory Visit: Payer: Self-pay

## 2020-06-17 ENCOUNTER — Encounter: Payer: Self-pay | Admitting: Gastroenterology

## 2020-06-17 VITALS — BP 148/84 | HR 67 | Temp 98.0°F | Resp 16 | Ht 70.0 in | Wt 183.0 lb

## 2020-06-17 DIAGNOSIS — K222 Esophageal obstruction: Secondary | ICD-10-CM | POA: Diagnosis not present

## 2020-06-17 DIAGNOSIS — K449 Diaphragmatic hernia without obstruction or gangrene: Secondary | ICD-10-CM

## 2020-06-17 DIAGNOSIS — R1310 Dysphagia, unspecified: Secondary | ICD-10-CM

## 2020-06-17 MED ORDER — OMEPRAZOLE 20 MG PO CPDR: 20.0000 mg | DELAYED_RELEASE_CAPSULE | Freq: Every day | ORAL | 3 refills | Status: DC

## 2020-06-17 MED ORDER — SODIUM CHLORIDE 0.9 % IV SOLN
500.0000 mL | Freq: Once | INTRAVENOUS | Status: DC
Start: 2020-06-17 — End: 2020-06-17

## 2020-06-17 NOTE — Patient Instructions (Signed)
Clear liquids until 1:00 pm, soft foods for the rest of today.  Continue omeprazole twice daily for four more weeks then decrease to once daily.     YOU HAD AN ENDOSCOPIC PROCEDURE TODAY AT [facility]:   Refer to the procedure report that was given to you for any specific questions about what was found during the examination.  If the procedure report does not answer your questions, please call your gastroenterologist to clarify.  If you requested that your care partner not be given the details of your procedure findings, then the procedure report has been included in a sealed envelope for you to review at your convenience later.  YOU SHOULD EXPECT: Some feelings of bloating in the abdomen. Passage of more gas than usual.  Walking can help get rid of the air that was put into your GI tract during the procedure and reduce the bloating. If you had a lower endoscopy (such as a colonoscopy or flexible sigmoidoscopy) you may notice spotting of blood in your stool or on the toilet paper. If you underwent a bowel prep for your procedure, you may not have a normal bowel movement for a few days.  Please Note:  You might notice some irritation and congestion in your nose or some drainage.  This is from the oxygen used during your procedure.  There is no need for concern and it should clear up in a day or so.  SYMPTOMS TO REPORT IMMEDIATELY:    Following upper endoscopy (EGD)  Vomiting of blood or coffee ground material  New chest pain or pain under the shoulder blades  Painful or persistently difficult swallowing  New shortness of breath  Fever of 100F or higher  Black, tarry-looking stools  For urgent or emergent issues, a gastroenterologist can be reached at any hour by calling [phone]. Do not use MyChart messaging for urgent concerns.    DIET:  Clear liquids until 1:00, then soft foods for the rest of today. Tommorrow  you may proceed to your regular diet.  Drink plenty  of fluids but you should avoid alcoholic beverages for 24 hours.  ACTIVITY:  You should plan to take it easy for the rest of today and you should NOT DRIVE or use heavy machinery until tomorrow (because of the sedation medicines used during the test).    FOLLOW UP: Our staff will call the number listed on your records 48-72 hours following your procedure to check on you and address any questions or concerns that you may have regarding the information given to you following your procedure. If we do not reach you, we will leave a message.  We will attempt to reach you two times.  During this call, we will ask if you have developed any symptoms of COVID 19. If you develop any symptoms (ie: fever, flu-like symptoms, shortness of breath, cough etc.) before then, please call [phone].  If you test positive for Covid 19 in the 2 weeks post procedure, please call and report this information to [place].    If any biopsies were taken you will be contacted by phone or by letter within the next 1-3 weeks.  Please call us at [phone] if you have not heard about the biopsies in 3 weeks.    SIGNATURES/CONFIDENTIALITY: You and/or your care partner have signed paperwork which will be entered into your electronic medical record.  These signatures attest to the fact that that the information above on your After Visit Summary has  been reviewed and is understood.  Full responsibility of the confidentiality of this discharge information lies with you and/or your care-partner.

## 2020-06-17 NOTE — Progress Notes (Signed)
CHECK-IN-AER  Vital Signs-NS

## 2020-06-17 NOTE — Progress Notes (Signed)
Notified Dr. Havery Moros of patients SQBS of 74, patient to be on clear liquids for two hours then soft foods for the rest of today.  Give her juice and recheck blood sugar.

## 2020-06-17 NOTE — Progress Notes (Addendum)
Called to room to assist during endoscopic procedure.  Patient ID and intended procedure confirmed with present staff. Received instructions for my participation in the procedure from the performing physician.   Per Dr. Havery Moros biopsy forcep used to take tissue at esophageal stricture to open the stricture up a bit more.  The tissue was not sent to pathology.  No Need.  maw

## 2020-06-17 NOTE — Op Note (Signed)
[facility] Patient Name: Catherine Mora Procedure Date: 06/17/2020 10:21 AM MRN: [id number] Endoscopist: Remo Lipps P. Havery Moros , MD Age: 70 Referring MD:  Date of Birth: 1950-03-10 Gender: Female Account #: [id number] Procedure:                Upper GI endoscopy Indications:              Dysphagia, Follow-up of gastro-esophageal reflux                            disease, Abnormal esophagram showing GEJ stricture                            that pill did not traverse - on omeprazole 20mg                             twice daily with improvement in reflux Medicines:                Monitored Anesthesia Care Procedure:                Pre-Anesthesia Assessment:                           - Prior to the procedure, a History and Physical                            was performed, and patient medications and                            allergies were reviewed. The patient's tolerance of                            previous anesthesia was also reviewed. The risks                            and benefits of the procedure and the sedation                            options and risks were discussed with the patient.                            All questions were answered, and informed consent                            was obtained. Prior Anticoagulants: The patient has                            taken no previous anticoagulant or antiplatelet                            agents. ASA Grade Assessment: II - A patient with                            mild systemic disease. After reviewing the risks  and benefits, the patient was deemed in                            satisfactory condition to undergo the procedure.                           After obtaining informed consent, the endoscope was                            passed under direct vision. Throughout the                            procedure, the patient's blood pressure, pulse, and                            oxygen  saturations were monitored continuously. The                            Endoscope was introduced through the mouth, and                            advanced to the second part of duodenum. The upper                            GI endoscopy was accomplished without difficulty.                            The patient tolerated the procedure well. Scope In: Scope Out: Findings:                 Esophagogastric landmarks were identified: the                            Z-line was found at 35 cm, the gastroesophageal                            junction was found at 35 cm and the upper extent of                            the gastric folds was found at 37 cm from the                            incisors.                           A 2 cm hiatal hernia was present.                           One benign-appearing, intrinsic mild stenosis was                            found 35 cm from the incisors. This stenosis  measured less than one cm (in length). A TTS                            dilator was passed through the scope. Dilation with                            a 16-17-18 mm balloon dilator was performed to 18                            mm, after which an appropriate mucosal wrent was                            appreciated. Cold forceps were then used to biopsy                            the stricture in multiple areas to open the                            stricture further.                           The exam of the esophagus was otherwise normal.                           The entire examined stomach was normal.                           The duodenal bulb and second portion of the                            duodenum were normal. Complications:            No immediate complications. Estimated blood loss:                            Minimal. Estimated Blood Loss:     Estimated blood loss was minimal. Impression:               - Esophagogastric landmarks identified.                            - 2 cm hiatal hernia.                           - Benign-appearing esophageal stenosis. Dilated to                            48mm with good result and biopsied to open it                            further.                           - Normal stomach.                           -  Normal duodenal bulb and second portion of the                            duodenum. Recommendation:           - Patient has a contact number available for                            emergencies. The signs and symptoms of potential                            delayed complications were discussed with the                            patient. Return to normal activities tomorrow.                            Written discharge instructions were provided to the                            patient.                           - Post dilation diet.                           - Continue present medications.                           - Continue omeprazole                           - Follow up as needed if symptoms recur or persist Martika Egler P. Havery Moros, MD 06/17/2020 10:43:59 AM This report has been signed electronically.

## 2020-06-17 NOTE — Progress Notes (Signed)
A/ox3, pleased with MAC, report to RN 

## 2020-06-19 ENCOUNTER — Other Ambulatory Visit: Payer: Self-pay | Admitting: Internal Medicine

## 2020-06-19 ENCOUNTER — Encounter (HOSPITAL_COMMUNITY): Payer: Self-pay

## 2020-06-19 ENCOUNTER — Ambulatory Visit (INDEPENDENT_AMBULATORY_CARE_PROVIDER_SITE_OTHER): Payer: Medicare PPO | Admitting: Internal Medicine

## 2020-06-19 ENCOUNTER — Encounter (INDEPENDENT_AMBULATORY_CARE_PROVIDER_SITE_OTHER): Payer: Self-pay | Admitting: Internal Medicine

## 2020-06-19 VITALS — BP 112/70 | HR 85 | Temp 98.5°F | Resp 12 | Ht 67.5 in | Wt 112.0 lb

## 2020-06-19 DIAGNOSIS — F411 Generalized anxiety disorder: Secondary | ICD-10-CM

## 2020-06-19 DIAGNOSIS — Z23 Encounter for immunization: Secondary | ICD-10-CM

## 2020-06-19 DIAGNOSIS — Z0001 Encounter for general adult medical examination with abnormal findings: Secondary | ICD-10-CM

## 2020-06-19 DIAGNOSIS — Z170 Estrogen receptor positive status [ER+]: Secondary | ICD-10-CM

## 2020-06-19 DIAGNOSIS — F331 Major depressive disorder, recurrent, moderate: Secondary | ICD-10-CM

## 2020-06-19 DIAGNOSIS — Z1329 Encounter for screening for other suspected endocrine disorder: Secondary | ICD-10-CM

## 2020-06-19 DIAGNOSIS — Z13220 Encounter for screening for lipoid disorders: Secondary | ICD-10-CM

## 2020-06-19 DIAGNOSIS — C50911 Malignant neoplasm of unspecified site of right female breast: Secondary | ICD-10-CM

## 2020-06-19 LAB — COMPREHENSIVE METABOLIC PANEL
ALT (GPT): 17 U/L (ref 7–33)
AST (GOT): 20 U/L (ref 9–38)
Albumin: 4.3 g/dL (ref 3.5–5.2)
Alkaline Phosphatase (Total): 55 U/L (ref 38–172)
Anion Gap: 8 (ref 4–12)
Bilirubin (Total): 0.5 mg/dL (ref 0.2–1.3)
Calcium: 10.3 mg/dL — ABNORMAL HIGH (ref 8.9–10.2)
Carbon Dioxide, Total: 30 meq/L (ref 22–32)
Chloride: 103 meq/L (ref 98–108)
Creatinine: 0.9 mg/dL (ref 0.38–1.02)
Glucose: 91 mg/dL (ref 62–125)
Potassium: 4.1 meq/L (ref 3.6–5.2)
Protein (Total): 7 g/dL (ref 6.0–8.2)
Sodium: 141 meq/L (ref 135–145)
Urea Nitrogen: 20 mg/dL (ref 8–21)
eGFR by CKD-EPI: >60 mL/min/{1.73_m2} (ref >59)

## 2020-06-19 LAB — LIPID PANEL
Cholesterol (LDL): 133 mg/dL — ABNORMAL HIGH (ref <130)
Cholesterol/HDL Ratio: 2.7
HDL Cholesterol: 87 mg/dL (ref >39)
Non-HDL Cholesterol: 148 mg/dL (ref 0–159)
Total Cholesterol: 235 mg/dL — ABNORMAL HIGH (ref <200)
Triglyceride: 75 mg/dL (ref <150)

## 2020-06-19 LAB — CBC, DIFF
% Basophils: 1 %
% Eosinophils: 6 %
% Immature Granulocytes: 0 %
% Lymphocytes: 31 %
% Monocytes: 9 %
% Neutrophils: 53 %
% Nucleated RBC: 0 %
Absolute Eosinophil Count: 0.37 10*3/uL (ref 0.00–0.50)
Absolute Lymphocyte Count: 1.91 10*3/uL (ref 1.00–4.80)
Basophils: 0.04 10*3/uL (ref 0.00–0.20)
Hematocrit: 41 % (ref 36.0–45.0)
Hemoglobin: 13.4 g/dL (ref 11.5–15.5)
Immature Granulocytes: 0.01 10*3/uL (ref 0.00–0.05)
MCH: 33.2 pg (ref 27.3–33.6)
MCHC: 33.1 g/dL (ref 32.2–36.5)
MCV: 100 fL — ABNORMAL HIGH (ref 81–98)
Monocytes: 0.53 10*3/uL (ref 0.00–0.80)
Neutrophils: 3.28 10*3/uL (ref 1.80–7.00)
Nucleated RBC: 0 10*3/uL
Platelet Count: 214 10*3/uL (ref 150–400)
RBC: 4.04 10*6/uL (ref 3.80–5.00)
RDW-CV: 12.2 % (ref 11.6–14.4)
WBC: 6.14 10*3/uL (ref 4.3–10.0)

## 2020-06-19 LAB — TSH WITH REFLEXIVE FREE T4: TSH with Reflexive Free T4: 2.167 u[IU]/mL (ref 0.400–5.000)

## 2020-06-19 MED ORDER — COVID-19 MRNA VACC (MODERNA) 100 MCG/0.5ML IM SUSP
0.2500 mL | Freq: Once | INTRAMUSCULAR | Status: AC
Start: 2020-06-19 — End: 2020-06-19
  Administered 2020-06-19: 0.25 mL via INTRAMUSCULAR

## 2020-06-19 NOTE — Progress Notes (Signed)
COVID-19 Vaccine Intake Documentation      Pre-Vaccination Screening Questions:         1.  Are you feeling sick today?      . If patient is COVID-19 positive or COVID-19 like symptoms, reschedule when no longer infectious (at least 10 days post positive test, resolution of fever without fever reducing medications, and improvement of symptoms).  If patient is COVID negative, patient should be informed that vaccine can result in flu-like symptoms before proceeding.  Patients no longer have to wait 90 days post COVID infection for the vaccine.          NO       2. Have you ever received a dose of COVID-19 vaccine?     YES         If yes, which vaccine product?   Moderna         3.  Have you  ever had a severe allergic reaction    (e.g., anaphylaxis) to something?  For example, a reaction for    which you were treated with epinephrine or Epi Pen  or    for which you had to go to the hospital?    . If yes, please review questions below:       NO       . Was the severe allergic reaction after receiving a COVID-19 vaccine?    . If yes to severe reaction (anaphylaxis) after COVID-19 vaccine, HARD STOP, DO NOT ADMINISTER      N/A     . Was the severe allergic reaction after receiving another vaccine or another injectable medication?    . If yes to severe reaction (anaphylaxis) after another vaccine/injectable (not COVID-19 vaccine), this is a precaution and can administer vaccine.  Observe 30 minutes.     N/A      4.  Do you attest that you meet the CDC eligibility criteria for this vaccine shot?   Yes     4.  Does the patient weigh 66 pounds or less?   No     5.  If this is a booster, does the patient attest that the appropriate timeframe has passed?     Yes     Medication Administrations This Visit       COVID-19 mRNA vaccine (Moderna) injection 0.25 mL Admin Date  06/19/2020  11:25 Action  Given Dose  0.25 mL Route  Intramuscular Site  Left Deltoid Administered By  Rande Brunt, CMA    Ordering Provider: Stacie Glaze, MD    NDC: 51761-607-37    Lot#: [id number]        Vaccine given today patient was asked to wait 15 minutes following and did not report any initial adverse effects. [place] Mella Inclan, CMA

## 2020-06-19 NOTE — Patient Instructions (Addendum)
You may be a candidate for the following screening and prevention measures:    Health Maintenance   Topic Date Due    Zoster Vaccine (2 of 3) 07/08/2012    Depression Screening (PHQ-2)  05/18/2020    Influenza Vaccine (Season Ended) 11/20/2020    Medicare Annual Wellness Visit  06/19/2021    Breast Cancer Screening  01/26/2022    Lipid Disorders Screening  07/06/2023    DTaP, Tdap, and Td Vaccines (2 - Td or Tdap) 11/28/2023    Colorectal Cancer Screening  09/14/2024    Osteoporosis Screening  Completed    Pneumococcal Vaccine: 65+ years  Completed    COVID-19 Vaccine  Completed    Hepatitis C Screening  Addressed    Hepatitis A Vaccine  Aged Out    Hepatitis B Vaccine  Aged Out   Based on today's evaluation, I recommend the following ways to improve your health or functioning.   1- Weight Control   2- Physical activity   3- Social activity   4- mental activity     You have the following risk factors and/or medical conditions for which there are recommended ways (included in this list) to help you stay as healthy as possible:  GAD

## 2020-06-19 NOTE — Progress Notes (Addendum)
ANNUAL WELLNESS VISIT    Paula Bowman is a 70 year old female who presents for an Annual Wellness Visit.   []  Initial   [x]  Subsequent   1- Seeing vascular  for aneurysm Brain   2- GI for colitis , used to have diarrhea she noticed over the last few weeks that without any change of her diet or any change with medication just is now back to normal   Denied any GI symptoms no nausea no vomiting no diarrhea no blood in the stool or black stool   but then yesterday she felt like a bubble in her stomach while she was driving and she said after that she started started to hyperventilate and get very anxious and felt tired and fatigue and her symptoms resolved after no other related symptoms,  this happened after she had dropped her dose of Nexium to none at this time because a friend mentioned her a problem with long-term use with Nexium which can affect her memory .        INFORMATION GATHERING:   The following areas were confirmed with patient/caregiver and/or updated in Epic at this visit:     Past Medical History   Past Surgical History   Family History   Social History    Current medications and supplements (including vitamins and calcium)   Allergies  [x]  All of the above components have been reviewed and updated.     Opioid Use:    [x]  Reviewed    The information below is up to date at the end of this visit:   Review of functional ability, hearing, fall risk and safety, diet, physical activity, and health habits  (via HRA or direct review with patient or caregiver)     The Health Risk Assessment (HRA) for today's visit was completed:    []  as Patient-Entered Questionnaire via eCare     [x]  as paper HRA form, completed by or with the patient or caregiver     []  in HRA template documentation, completed by staff or provider with patient or caregiver at visit      List of current providers and suppliers:     []   See Care Team Section   [x]   See EHR Encounters for [facility] Providers involved in  care   []   Other providers and suppliers outside [facility]:     Depression screening:   PHQ-2:   3  PHQ-9 (if done):     1. Little interest or pleasure in doing things: Not at all    2. Feeling down, depressed or hopeless: (!) Nearly every day    3. Trouble falling or staying asleep, or sleeping too much: Several days    4. Feeling tired or having little energy : Not at all    5. Poor appetite or overeating: Not at all    6. Feeling bad about yourself - or that you are a failure or have let yourself or your family down: Not at all    7. Trouble concentrating on things, such as reading the newspaper or watching television: Not at all    8. Moving or speaking much more slowly than usual.  Or the opposite - fidgety or restless: Not at all    9. Thoughts that you would be better off dead or of hurting yourself in some way: Not at all    PHQ9 Total Score: 4    10. If you checked off any problems, how difficult have  these problems made it for you to do your work, take care of things at home, or get along with other people?: Not difficult at all        EXAM:  BP 112/70   Pulse 85   Temp 36.9 C (Temporal)   Resp (!) 12   Ht 5' 7.5" (1.715 m)   Wt 50.8 kg (112 lb)   SpO2 98%   BMI 17.28 kg/m        GAIT:    [x]  Normal, stable, independent    []  Abnormal (describe):    As assessed by:     []  Direct Observation     []  Timed Up and Go     []  Other:  COGNITION:    [x]  Intact     []  Abnormal (describe):     As assessed by:      [x]  Direct Observation     []  Brief Cognitive Screen  Physical Exam  Vitals reviewed. Exam conducted with a chaperone present.   Constitutional:       Appearance: Normal appearance.   HENT:      Head: Normocephalic and atraumatic.   Eyes:      General: No scleral icterus.        Right eye: No discharge.         Left eye: No discharge.      Conjunctiva/sclera: Conjunctivae normal.   Cardiovascular:      Rate and Rhythm: Normal rate and regular rhythm.      Pulses: Normal pulses.      Heart  sounds: Normal heart sounds. No murmur heard.    No gallop.   Pulmonary:      Effort: Pulmonary effort is normal. No respiratory distress.      Breath sounds: No stridor. No wheezing, rhonchi or rales.   Chest:      Chest wall: No tenderness.   Breasts:      Right: Skin change present. No swelling, bleeding, inverted nipple, mass, nipple discharge or tenderness.      Left: Normal. No swelling, bleeding, inverted nipple, mass, nipple discharge, skin change or tenderness.        Comments: Right Breast : between 12 o'clock and 2 o'clock there is scar tissue , which is firm , patient said there is no change since she been treated for breast cancer   Abdominal:      General: Abdomen is flat. There is no distension.      Palpations: There is no mass.      Tenderness: There is no abdominal tenderness. There is no right CVA tenderness, left CVA tenderness, guarding or rebound.      Hernia: No hernia is present.   Musculoskeletal:      Cervical back: Neck supple.      Right lower leg: No edema.      Left lower leg: No edema.   Lymphadenopathy:      Cervical: No cervical adenopathy.   Skin:     General: Skin is warm and dry.   Neurological:      General: No focal deficit present.      Mental Status: She is alert and oriented to person, place, and time. Mental status is at baseline.   Psychiatric:         Mood and Affect: Mood normal.         Behavior: Behavior normal.          ADVANCE CARE PLANNING (ACP)  Advance care planning:   [x]  Patient Accepted   []  Patient Declined     Check all that apply:   []  Advance directives are on file in her chart   [x]  Explained & discussed advance directives at this visit   []  Completed advance care planning form(s) at this visit   []  Other Advance Care Planning discussion at this visit (describe):       Time Spent on ACP: []  None       [x]  1-15 minutes      []  16-30 minutes      []  >30 minutes      ASSESSMENT:   Paula Bowman was seen for her Annual Wellness Visit, including  identification of risk factors & conditions that may affect her health and function in the future.    1. Encounter for well adult exam with abnormal findings  - CBC with Diff; Future  - Comprehensive Metabolic Panel; Future  - Comprehensive Metabolic Panel  - CBC with Diff    2. Moderate episode of recurrent major depressive disorder ([place])  3. GAD (generalised anxiety disorder)  Discussed with the patient  , declined trying medication because of side effects experiences she had in the past   She agreed to see psychiatry to discuss medication start   - Referral to [facility]; Future  I have introduced the Collaborative Care (CoCM) code billing to the patient. We discussed the following:   Collaborative Care services will be billed once a month and are time based (these may be billed separately from any fee-for-service service provided).   The patient has been informed that there may be cost-sharing for both in person and indirect care.   Enrollment in this service includes a review of the patient's care by a consulting psychiatrist and/or other care team members.   Participation is this program is voluntary and the patient may elect to discontinue at any time.  The patient understands this information and consents to be enrolled in this service.        4. Screening for hyperlipidaemia  - Lipid Panel; Future  - Lipid Panel    5. Screening for thyroid disorder  - TSH w/Reflexive Free T4; Future  - TSH w/Reflexive Free T4    6. Malignant neoplasm of right breast in female, estrogen receptor positive, unspecified site of breast ([place])  12 years   She does mammogram on regular basis     7. Need for vaccination  - COVID-19 mRNA vaccine (Moderna) injection 0.25 mL     Patient will see Gi to discuss her GI symptoms      Actions at this visit:       Establishing or updating a written schedule of screening and prevention measures recommended and appropriate for Paula Bowman for the next 5-10 years   Establishing or  updating a list of her risk factors and conditions for which lifestyle or medical interventions are recommended or underway, including mental health risks and conditions, and including risks/benefits of treatment   Furnishing personalized health advice and, as appropriate, referrals to health education or preventive counseling services or programs (such as fall prevention, tobacco cessation, physical activity, nutrition, weight loss)    [x]  All of the above components have been reviewed and updated.     COUNSELING:      Based on today's evaluation, I recommend the following ways to improve your health or functioning.   1- Weight Control   2- Physical activity   3-  Social activity   4- mental activity     You have the following risk factors and/or medical conditions for which there are recommended ways (included in this list) to help you stay as healthy as possible:  1. GAD    Here are screening & prevention measures recommended for you:  Health Maintenance   Topic Date Due   . Zoster Vaccine (2 of 3) 07/08/2012   . Influenza Vaccine (Season Ended) 11/20/2020   . Depression Screening (PHQ-2)  06/19/2021   . Medicare Annual Wellness Visit  06/19/2021   . Breast Cancer Screening  01/26/2022   . Lipid Disorders Screening  07/06/2023   . DTaP, Tdap, and Td Vaccines (2 - Td or Tdap) 11/28/2023   . Colorectal Cancer Screening  09/14/2024   . Osteoporosis Screening  Completed   . Pneumococcal Vaccine: 65+ years  Completed   . COVID-19 Vaccine  Completed   . Hepatitis C Screening  Addressed   . Hepatitis A Vaccine  Aged Out   . Hepatitis B Vaccine  Aged Out       Please plan to have a Subsequent Annual Wellness Visit in 1 year.

## 2020-06-20 ENCOUNTER — Encounter (INDEPENDENT_AMBULATORY_CARE_PROVIDER_SITE_OTHER): Payer: Self-pay | Admitting: Internal Medicine

## 2020-06-21 ENCOUNTER — Telehealth: Payer: Self-pay

## 2020-06-21 NOTE — Telephone Encounter (Signed)
  Follow up Call-  Call back number 06/17/2020 03/27/2019  Post procedure Call Back phone  # [phone]  Permission to leave phone message Yes Yes  Some recent data might be hidden     Patient questions:  Do you have a fever, pain , or abdominal swelling? No. Pain Score  0 *  Have you tolerated food without any problems? Yes.    Have you been able to return to your normal activities? Yes.    Do you have any questions about your discharge instructions: Diet   No. Medications  No. Follow up visit  No.  Do you have questions or concerns about your Care? No.  Actions: * If pain score is 4 or above: No action needed, pain <4.  1. Have you developed a fever since your procedure? no  2.   Have you had an respiratory symptoms (SOB or cough) since your procedure? no  3.   Have you tested positive for COVID 19 since your procedure no  4.   Have you had any family members/close contacts diagnosed with the COVID 19 since your procedure?  no   If yes to any of these questions please route to Joylene John, RN and Joella Prince, RN

## 2020-06-24 ENCOUNTER — Other Ambulatory Visit: Payer: Self-pay | Admitting: Internal Medicine

## 2020-07-01 ENCOUNTER — Telehealth (INDEPENDENT_AMBULATORY_CARE_PROVIDER_SITE_OTHER): Payer: Self-pay | Admitting: Social Worker

## 2020-07-01 ENCOUNTER — Encounter (INDEPENDENT_AMBULATORY_CARE_PROVIDER_SITE_OTHER): Payer: Self-pay | Admitting: Social Worker

## 2020-07-01 NOTE — Telephone Encounter (Signed)
SW sent the [facility] psychiatry scheduling instructions to pt via eCare and informed PCP and psychiatrist Dr. Lucia Gaskins.

## 2020-07-02 ENCOUNTER — Telehealth: Payer: Self-pay | Admitting: Internal Medicine

## 2020-07-02 NOTE — Progress Notes (Signed)
  Chronic Care Management   Note  07/02/2020 Name: Catherine Mora MRN: [id number] DOB: [birth year]  Catherine Mora is a 70 y.o. year old female who is a primary care patient of Plotnikov, Evie Lacks, MD. I reached out to Devonne Doughty by phone today in response to a referral sent by Catherine Mora's PCP, Plotnikov, Evie Lacks, MD.   [name] was given information about Chronic Care Management services today including:  1. CCM service includes personalized support from designated clinical staff supervised by her physician, including individualized plan of care and coordination with other care providers 2. 24/7 contact phone numbers for assistance for urgent and routine care needs. 3. Service will only be billed when office clinical staff spend 20 minutes or more in a month to coordinate care. 4. Only one practitioner may furnish and bill the service in a calendar month. 5. The patient may stop CCM services at any time (effective at the end of the month) by phone call to the office staff.   Patient agreed to services and verbal consent obtained.   Follow up plan:   [place]

## 2020-07-15 ENCOUNTER — Telehealth (INDEPENDENT_AMBULATORY_CARE_PROVIDER_SITE_OTHER): Payer: Medicare PPO | Admitting: Psychiatry

## 2020-07-15 DIAGNOSIS — F331 Major depressive disorder, recurrent, moderate: Secondary | ICD-10-CM

## 2020-07-15 DIAGNOSIS — F411 Generalized anxiety disorder: Secondary | ICD-10-CM

## 2020-07-15 NOTE — Progress Notes (Signed)
[facility] Telemedicine Encounter    I conducted this encounter from My Home via secure, live, face-to-face video conference with the patient. Talor was located at Home with no one.  I reviewed the risks and benefits of telemedicine as pertinent to this visit and the patient agreed to proceed.    PSYCHIATRY EVALUATION    BHIP Consultation    CC/HPI: Paula Bowman is a 70 yo woman referred to discuss "maybe an antidepressant." She feels "overwhelmed" by health issues - cerebral aneurysm, also recently started slurring speech, unknown auto-immune condition, colitis "Just living with it day to day." She feels "like there's something wrong with me." She feels afraid of potential health problems. Sleep is variable - has had periods of insomnia, "not bad" recently. She naps daily. Losing interest in cooking, but diet is very healthy. Still plays bridge, daily walks. She is often tired, "things hurt." She thinks her emotional state is mostly affected by physical/health concerns lately.    She reports a remote history of panic attacks in 1980s - was on imipramine for several years which was very helpful. She hasn't had panic attacks in 20 years. Denies excessive worry, but describes herself as "neat freak." Denies OCD behaviors.     Past Psychiatric History: Past trials of SSRIs all with bad reactions. No other MH diagnoses, has done counseling. No hospitalizations, no suicide attempts.  Substance Use History: Drinking 8oz wine every evening. Had period of drinking more several years ago. No other substance use.    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 Lives with wife of 30 years, no children, has "small circle of close friends."  Family Psychiatric History: none    Medications: no psychotropics    Mental Status Exam: She presents as casually dressed and well kempt, calm and cooperative. Speech is clear, coherent, fluent. Mood is euthymic and affect full  range. Thought content includes no suicidal ideation and thought process is logical, coherent, goal-directed. Cognitively, she is alert with intact memory and orientation. Fund of knowledge appropriate. Insight & judgment are good.    Assessment/Recommendations: Paula Bowman is a 70 yo married, retired woman with a remote history of panic disorder. She presents now with distress that seems closely related to ongoing physical symptoms and anxiety about potential causes. I don't see this as consistent with primary mood disorder and adjustment disorder is more appropriate. Alcohol use may be worsening symptoms to an extent.    Risk factors for suicide include significant physical pain/debilitating illness, while protective factors are responsibility to others, high spirituality or belief that suicide is immoral, protective social network and willingness to follow crisis plan. Overall, acute risk of suicide appears low.    Adjustment Disorder with anxious mood  -would not recommend starting antidepressant at this time  -discussed options for counseling either via BHIP or private therapy via psychology today  -recommended that she reduce alcohol use further to 1 drink nightly or less. She was reluctant to do so.  -discussed availability for RV through August

## 2020-07-27 ENCOUNTER — Encounter: Payer: Self-pay | Admitting: Internal Medicine

## 2020-07-27 ENCOUNTER — Ambulatory Visit (INDEPENDENT_AMBULATORY_CARE_PROVIDER_SITE_OTHER): Payer: HMO | Admitting: Internal Medicine

## 2020-07-27 ENCOUNTER — Other Ambulatory Visit: Payer: Self-pay

## 2020-07-27 DIAGNOSIS — I2510 Atherosclerotic heart disease of native coronary artery without angina pectoris: Secondary | ICD-10-CM

## 2020-07-27 DIAGNOSIS — E785 Hyperlipidemia, unspecified: Secondary | ICD-10-CM

## 2020-07-27 DIAGNOSIS — I2583 Coronary atherosclerosis due to lipid rich plaque: Secondary | ICD-10-CM

## 2020-07-27 DIAGNOSIS — F32A Depression, unspecified: Secondary | ICD-10-CM | POA: Insufficient documentation

## 2020-07-27 DIAGNOSIS — I10 Essential (primary) hypertension: Secondary | ICD-10-CM | POA: Diagnosis not present

## 2020-07-27 DIAGNOSIS — F329 Major depressive disorder, single episode, unspecified: Secondary | ICD-10-CM | POA: Diagnosis not present

## 2020-07-27 MED ORDER — CLONAZEPAM 0.25 MG PO TBDP: 0.2500 mg | ORAL_TABLET | Freq: Two times a day (BID) | ORAL | 1 refills | Status: DC | PRN

## 2020-07-27 MED ORDER — DULOXETINE HCL 20 MG PO CPEP: 20.0000 mg | ORAL_CAPSULE | Freq: Every day | ORAL | 5 refills | Status: DC

## 2020-07-27 NOTE — Assessment & Plan Note (Signed)
Worse. Start Cymbalta Clonazepam prn panic attacks

## 2020-07-27 NOTE — Progress Notes (Signed)
Subjective:  Patient ID: Catherine Mora, female    DOB: Jul 10, 1950  Age: 70 y.o. MRN: [id number]  CC: Depression   HPI Catherine Mora presents for being depressed, panic attacks x 1 year. Niece died a year ago - OD'ed. Husband is ill.   Outpatient Medications Prior to Visit  Medication Sig Dispense Refill   ALPRAZolam (XANAX) 1 MG tablet TAKE 1 TABLET BY MOUTH 2 TIMES DAILY AS NEEDED FOR ANXIETY. 60 tablet 1   BIOTIN FORTE PO Take 500 mg by mouth daily.     bisacodyl (DULCOLAX) 5 MG EC tablet Take 1 tablet (5 mg total) by mouth daily as needed for moderate constipation. 30 tablet 3   glimepiride (AMARYL) 1 MG tablet Take 1 mg by mouth daily with breakfast.     ibuprofen (ADVIL,MOTRIN) 200 MG tablet Take 400 mg by mouth every 6 (six) hours as needed for headache.     insulin detemir (LEVEMIR) 100 UNIT/ML FlexPen Inject 16 Units into the skin 2 (two) times daily.     Insulin Pen Needle 31G X 5 MM MISC Use with Levemir pen 100 each 0   lisinopril (ZESTRIL) 5 MG tablet Take 5 mg by mouth daily.     lovastatin (MEVACOR) 20 MG tablet TAKE 1 TABLET DAILY 90 tablet 3   NOVOLOG FLEXPEN 100 UNIT/ML FlexPen TAKE AS DIRECTED PER SLIDING SCALE. MAX DAILY DOSE: 50 UNITS/DAY.  3   omeprazole (PRILOSEC) 20 MG capsule TAKE 1 CAPSULE BY MOUTH IN THE MORNING AND AT BEDTIME 30-60 MINUTES BEFORE BREAKFAST AND DINNER 180 capsule 3   omeprazole (PRILOSEC) 20 MG capsule Take 1 capsule (20 mg total) by mouth daily. 90 capsule 3   ONETOUCH VERIO test strip 1 each by Other route 2 (two) times daily.      Polyethyl Glycol-Propyl Glycol (SYSTANE FREE OP) Apply to eye.     pravastatin (PRAVACHOL) 20 MG tablet Take 1 tablet (20 mg total) by mouth daily. 30 tablet 11   Krill Oil 1000 MG CAPS Take 1,000 mg by mouth daily. (Patient not taking: No sig reported)     No facility-administered medications prior to visit.    ROS: Review of Systems  Constitutional: Negative for activity change, appetite change,  chills, fatigue and unexpected weight change.  HENT: Negative for congestion, mouth sores and sinus pressure.   Eyes: Negative for visual disturbance.  Respiratory: Negative for cough and chest tightness.   Gastrointestinal: Negative for abdominal pain and nausea.  Genitourinary: Negative for difficulty urinating, frequency and vaginal pain.  Musculoskeletal: Negative for back pain and gait problem.  Skin: Negative for pallor and rash.  Neurological: Negative for dizziness, tremors, weakness, numbness and headaches.  Psychiatric/Behavioral: Positive for dysphoric mood. Negative for confusion, sleep disturbance and suicidal ideas. The patient is nervous/anxious.     Objective:  BP (!) 142/92 (BP Location: Left Arm)   Pulse 73   Temp 98.3 F (36.8 C) (Oral)   Ht 5\' 10"  (1.778 m)   Wt 184 lb 12.8 oz (83.8 kg)   SpO2 97%   BMI 26.52 kg/m   BP Readings from Last 3 Encounters:  07/27/20 (!) 142/92  06/17/20 (!) 148/84  05/12/20 106/80    Wt Readings from Last 3 Encounters:  07/27/20 184 lb 12.8 oz (83.8 kg)  06/17/20 183 lb (83 kg)  05/12/20 183 lb 2 oz (83.1 kg)    Physical Exam Constitutional:      General: She is not in acute distress.  Appearance: Normal appearance. She is well-developed.  HENT:     Head: Normocephalic.     Right Ear: External ear normal.     Left Ear: External ear normal.     Nose: Nose normal.  Eyes:     General:        Right eye: No discharge.        Left eye: No discharge.     Conjunctiva/sclera: Conjunctivae normal.     Pupils: Pupils are equal, round, and reactive to light.  Neck:     Thyroid: No thyromegaly.     Vascular: No JVD.     Trachea: No tracheal deviation.  Cardiovascular:     Rate and Rhythm: Normal rate and regular rhythm.     Heart sounds: Normal heart sounds.  Pulmonary:     Effort: No respiratory distress.     Breath sounds: No stridor. No wheezing.  Abdominal:     General: Bowel sounds are normal. There is no  distension.     Palpations: Abdomen is soft. There is no mass.     Tenderness: There is no abdominal tenderness. There is no guarding or rebound.  Musculoskeletal:        General: No tenderness.     Cervical back: Normal range of motion and neck supple.  Lymphadenopathy:     Cervical: No cervical adenopathy.  Skin:    Findings: No erythema or rash.  Neurological:     Mental Status: She is oriented to person, place, and time.     Cranial Nerves: No cranial nerve deficit.     Motor: No abnormal muscle tone.     Coordination: Coordination normal.     Deep Tendon Reflexes: Reflexes normal.  Psychiatric:        Behavior: Behavior normal.        Thought Content: Thought content normal.        Judgment: Judgment normal.   Sad   Lab Results  Component Value Date   WBC 6.4 03/04/2019   HGB 13.3 03/04/2019   HCT 40.6 03/04/2019   PLT 318.0 03/04/2019   GLUCOSE 124 (H) 03/04/2019   CHOL 266 (H) 03/04/2019   TRIG 149.0 03/04/2019   HDL 49.80 03/04/2019   LDLDIRECT 219.0 08/14/2014   LDLCALC 186 (H) 03/04/2019   ALT 15 03/04/2019   AST 19 03/04/2019   NA 138 03/04/2019   K 5.1 03/04/2019   CL 104 03/04/2019   CREATININE 0.94 03/04/2019   BUN 15 03/04/2019   CO2 29 03/04/2019   TSH 2.61 03/04/2019   HGBA1C 7.7 (H) 10/03/2016   MICROALBUR 5.7 (H) 03/04/2019    DG ESOPHAGUS W DOUBLE CM (HD)  Result Date: 04/16/2020 CLINICAL DATA:  Dysphagia EXAM: ESOPHOGRAM / BARIUM SWALLOW / BARIUM TABLET STUDY TECHNIQUE: Combined double contrast and single contrast examination performed using effervescent crystals, thick barium liquid, and thin barium liquid. The patient was observed with fluoroscopy swallowing a 13 mm barium sulphate tablet. FLUOROSCOPY TIME:  Fluoroscopy Time:  1 minutes 12 second Radiation Exposure Index (if provided by the fluoroscopic device): Number of Acquired Spot Images: 6 COMPARISON:  None. FINDINGS: Mild esophageal dysmotility. Transverse mucosal folds are seen  throughout the esophagus during swallowing with a feline esophagus appearance. 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 This has a benign appearance. There is a mild to moderate hiatal hernia. Barium tablet did not pass through the stricture. 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 Barium tablet did not pass through the stricture. Electronically Signed   By: Franchot Gallo M.D.   On: 04/16/2020 10:29    Assessment & Plan:    Walker Kehr, MD

## 2020-08-03 DIAGNOSIS — E785 Hyperlipidemia, unspecified: Secondary | ICD-10-CM | POA: Insufficient documentation

## 2020-08-03 NOTE — Assessment & Plan Note (Signed)
Continue with pravastatin 

## 2020-08-03 NOTE — Assessment & Plan Note (Signed)
Continue with lisinopril

## 2020-08-12 ENCOUNTER — Ambulatory Visit: Payer: HMO

## 2020-08-12 NOTE — Progress Notes (Deleted)
Chronic Care Management Pharmacy Note  08/12/2020 Name:  Catherine Mora MRN:  [id number] DOB:  1950-07-15  Summary:   Recommendations/Changes made from today's visit:   Plan:   Subjective: Catherine Mora is an 70 y.o. year old female who is a primary patient of Plotnikov, Evie Lacks, MD.  The CCM team was consulted for assistance with disease management and care coordination needs.    {CCMTELEPHONEFACETOFACE:21091510} for {CCMINITIALFOLLOWUPCHOICE:21091511} in response to provider referral for pharmacy case management and/or care coordination services.   Consent to Services:  {CCMCONSENTOPTIONS:25074}  Patient Care Team: Plotnikov, Evie Lacks, MD as PCP - Dorita Sciara, MD as Obstetrician (Obstetrics and Gynecology) Amalia Greenhouse, MD as Referring Physician (Endocrinology) Lady Gary, [facility] as Consulting Physician (Obstetrics and Gynecology) Tomasa Blase, [facility] (Pharmacist) Tomasa Blase, [facility] as Pharmacist (Pharmacist)  Recent office visits: 07/27/2020 - PCP visit - depression  / panic attacks - starting duloxetine and clonazepam 03/09/2020 - PCP visit - sinusitis / arm lesion - started azithromycin / referred to dermatology   Recent consult visits: 06/17/2020 - Dr. Havery Moros - EGD completed  05/12/2020 - Ellouise Newer PA-C - Gertie Fey - dysphagia - scheduled for EGD, started omeprazole  04/29/2020 - Dr. Redmond Baseman - [facility] - barium swallow completed - planned to be referred for evaluation by GI  04/08/2020 - Dr. Redmond Baseman - [facility] - initial consult - fiberoptic exam of pharynx and larynx - unremarkable - planned barium swallow  03/24/2020 - Autumn Jones PA-C - [facility] endo - f/u on DM2 - declined trial of toujeo or tresiba - agreeable to continue BID dosing of levemir, glimepiride increased to 46m every morning - intolerant to SGLT2 and GLP1 agents   Hospital visits: None in previous 6 months  Objective:  Lab Results  Component Value Date   CREATININE  0.94 03/04/2019   BUN 15 03/04/2019   GFR 59.06 (L) 03/04/2019   GFRNONAA >60 08/22/2014   GFRAA >60 08/22/2014   NA 138 03/04/2019   K 5.1 03/04/2019   CALCIUM 9.7 03/04/2019   CO2 29 03/04/2019   GLUCOSE 124 (H) 03/04/2019    Lab Results  Component Value Date/Time   HGBA1C 7.7 (H) 10/03/2016 11:47 AM   HGBA1C 7.8 (H) 09/28/2015 10:32 AM   GFR 59.06 (L) 03/04/2019 11:41 AM   GFR 69.85 10/12/2017 09:04 AM   MICROALBUR 5.7 (H) 03/04/2019 11:41 AM   MICROALBUR 5.2 (H) 10/12/2017 09:11 AM    Last diabetic Eye exam:  No results found for: HMDIABEYEEXA  Last diabetic Foot exam:  No results found for: HMDIABFOOTEX   Lab Results  Component Value Date   CHOL 266 (H) 03/04/2019   HDL 49.80 03/04/2019   LDLCALC 186 (H) 03/04/2019   LDLDIRECT 219.0 08/14/2014   TRIG 149.0 03/04/2019   CHOLHDL 5 03/04/2019    Hepatic Function Latest Ref Rng & Units 03/04/2019 10/12/2017 10/03/2016  Total Protein 6.0 - 8.3 g/dL 7.7 7.6 7.2  Albumin 3.5 - 5.2 g/dL 4.0 4.0 4.1  AST 0 - 37 U/L '19 21 15  ' ALT 0 - 35 U/L '15 23 14  ' Alk Phosphatase 39 - 117 U/L 75 78 80  Total Bilirubin 0.2 - 1.2 mg/dL 0.3 0.4 0.4  Bilirubin, Direct 0.0 - 0.3 mg/dL 0.0 0.1 0.2    Lab Results  Component Value Date/Time   TSH 2.61 03/04/2019 11:41 AM   TSH 4.37 10/12/2017 09:04 AM    CBC Latest Ref Rng & Units 03/04/2019 10/12/2017 10/03/2016  WBC 4.0 - 10.5 K/uL 6.4 6.5 6.0  Hemoglobin 12.0 - 15.0 g/dL 13.3 12.7 14.4  Hematocrit 36.0 - 46.0 % 40.6 37.9 43.7  Platelets 150.0 - 400.0 K/uL 318.0 307.0 281.0    Lab Results  Component Value Date/Time   VD25OH 34 10/10/2012 10:48 AM   VD25OH 35 03/15/2009 08:54 PM    Clinical ASCVD: No  The 10-year ASCVD risk score Mikey Bussing DC Jr., et al., 2013) is: 30.1%   Values used to calculate the score:     Age: 35 years     Sex: Female     Is Non-Hispanic African American: No     Diabetic: Yes     Tobacco smoker: No     Systolic Blood Pressure: 175 mmHg     Is BP treated:  Yes     HDL Cholesterol: 49.8 mg/dL     Total Cholesterol: 266 mg/dL    [facility] 2/9 07/27/2020 04/21/2020 04/02/2017  Decreased Interest 1 0 0  Down, Depressed, Hopeless 3 0 0  PHQ - 2 Score 4 0 0  Altered sleeping 3 - -  Tired, decreased energy 2 - -  Change in appetite 1 - -  Feeling bad or failure about yourself  0 - -  Trouble concentrating 1 - -  Moving slowly or fidgety/restless 1 - -  Suicidal thoughts 0 - -  PHQ-9 Score 12 - -  Difficult doing work/chores Somewhat difficult - -     Social History   Tobacco Use  Smoking Status Never  Smokeless Tobacco Never   BP Readings from Last 3 Encounters:  07/27/20 (!) 142/92  06/17/20 (!) 148/84  05/12/20 106/80   Pulse Readings from Last 3 Encounters:  07/27/20 73  06/17/20 67  05/12/20 84   Wt Readings from Last 3 Encounters:  07/27/20 184 lb 12.8 oz (83.8 kg)  06/17/20 183 lb (83 kg)  05/12/20 183 lb 2 oz (83.1 kg)   BMI Readings from Last 3 Encounters:  07/27/20 26.52 kg/m  06/17/20 26.26 kg/m  05/12/20 26.28 kg/m    Assessment/Interventions: Review of patient past medical history, allergies, medications, health status, including review of consultants reports, laboratory and other test data, was performed as part of comprehensive evaluation and provision of chronic care management services.   SDOH:  (Social Determinants of Health) assessments and interventions performed: {yes/no:20286}  SDOH Screenings   Alcohol Screen: Low Risk    Last Alcohol Screening Score (AUDIT): 0  Depression (PHQ2-9): Medium Risk   PHQ-2 Score: 12  Financial Resource Strain: Low Risk    Difficulty of Paying Living Expenses: Not hard at all  Food Insecurity: No Food Insecurity   Worried About Charity fundraiser in the Last Year: Never true   Ran Out of Food in the Last Year: Never true  Housing: Low Risk    Last Housing Risk Score: 0  Physical Activity: Sufficiently Active   Days of Exercise per Week: 7 days    Minutes of Exercise per Session: 30 min  Social Connections: Engineer, building services of Communication with Friends and Family: More than three times a week   Frequency of Social Gatherings with Friends and Family: More than three times a week   Attends Religious Services: More than 4 times per year   Active Member of [employer] or Organizations: Yes   Attends Archivist Meetings: More than 4 times per year   Marital Status: Married  Stress: No Stress Concern Present  Feeling of Stress : Not at all  Tobacco Use: Low Risk    Smoking Tobacco Use: Never   Smokeless Tobacco Use: Never  Transportation Needs: No Transportation Needs   Lack of Transportation (Medical): No   Lack of Transportation (Non-Medical): No    CCM Care Plan  Allergies  Allergen Reactions   Metformin Other (See Comments)    Hair loss   Ciprofloxacin Hives    REACTION: itching   Crestor [Rosuvastatin Calcium] Other (See Comments)    arthralgia   Hydrochlorothiazide Other (See Comments)    REACTION: hair loss   Invokana [Canagliflozin] Other (See Comments)    ketoacidosis   Paroxetine Other (See Comments)    REACTION: living in glass   Penicillins Hives   Simvastatin Other (See Comments)    REACTION: aches with statins    Medications Reviewed Today     Reviewed by Cassandria Anger, MD (Physician) on 07/27/20 at 1516  Med List Status: <None>   Medication Order Taking? Sig Documenting Provider Last Dose Status Informant  ALPRAZolam (XANAX) 1 MG tablet [id number] Yes TAKE 1 TABLET BY MOUTH 2 TIMES DAILY AS NEEDED FOR ANXIETY. Plotnikov, Evie Lacks, MD Taking Active   BIOTIN FORTE PO [id number] Yes Take 500 mg by mouth daily. [provider] Taking Active   bisacodyl (DULCOLAX) 5 MG EC tablet [id number] Yes Take 1 tablet (5 mg total) by mouth daily as needed for moderate constipation. Plotnikov, Evie Lacks, MD Taking Active   clonazePAM (KLONOPIN) 0.25 MG disintegrating tablet [id number]  Yes Take 1 tablet (0.25 mg total) by mouth 2 (two) times daily as needed. Plotnikov, Evie Lacks, MD  Active   DULoxetine (CYMBALTA) 20 MG capsule [id number] Yes Take 1 capsule (20 mg total) by mouth daily. Plotnikov, Evie Lacks, MD  Active   glimepiride (AMARYL) 1 MG tablet [id number] Yes Take 1 mg by mouth daily with breakfast. [provider] Taking Active   ibuprofen (ADVIL,MOTRIN) 200 MG tablet [id number] Yes Take 400 mg by mouth every 6 (six) hours as needed for headache. [provider] Taking Active Multiple Informants  insulin detemir (LEVEMIR) 100 UNIT/ML FlexPen [id number] Yes Inject 16 Units into the skin 2 (two) times daily. [provider] Taking Active   Insulin Pen Needle 31G X 5 MM MISC [id number] Yes Use with Levemir pen Cristal Ford, DO Taking Active   lisinopril (ZESTRIL) 5 MG tablet [id number] Yes Take 5 mg by mouth daily. [provider] Taking Active   lovastatin (MEVACOR) 20 MG tablet [id number] Yes TAKE 1 TABLET DAILY Plotnikov, Evie Lacks, MD Taking Active   NOVOLOG FLEXPEN 100 UNIT/ML FlexPen [id number] Yes TAKE AS DIRECTED PER SLIDING SCALE. MAX DAILY DOSE: 50 UNITS/DAY. [provider] Taking Active   omeprazole (PRILOSEC) 20 MG capsule [id number] Yes TAKE 1 CAPSULE BY MOUTH IN THE MORNING AND AT BEDTIME 30-60 MINUTES BEFORE BREAKFAST AND DINNER Levin Erp, [place] Taking Active   omeprazole (PRILOSEC) 20 MG capsule [id number] Yes Take 1 capsule (20 mg total) by mouth daily. Yetta Flock, MD Taking Active   [facility] VERIO test strip [id number] Yes 1 each by Other route 2 (two) times daily.  [provider] Taking Active            Med Note Madilyn Hook Aug 27, 2014 11:16 AM) Received from: External Pharmacy Received Sig:     Discontinued 03/26/19 1044   Polyethyl Glycol-Propyl Glycol (SYSTANE FREE OP) [id number] Yes Apply to eye. [provider]  Taking Active   pravastatin (PRAVACHOL) 20 MG  tablet [id number] Yes Take 1 tablet (20 mg total) by mouth daily. Plotnikov, Evie Lacks, MD Taking Active             Patient Active Problem List   Diagnosis Date Noted   Dyslipidemia 08/03/2020   Depression 07/27/2020   Acute sinusitis 03/09/2020   Coronary atherosclerosis 03/12/2019   Leg hematoma, right, initial encounter 10/12/2017   Skin nodule 10/12/2017   Allergic rhinitis 10/03/2016   Rash 06/26/2016   Malnutrition of moderate degree ([place]) 08/18/2014   Dehydration 08/17/2014   Nausea with vomiting 08/17/2014   DKA (diabetic ketoacidoses) 08/17/2014   AKI (acute kidney injury) ([place]) 08/17/2014   Polydipsia 08/13/2014   DM2 (diabetes mellitus, type 2) ([place]) 08/13/2014   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   URI, acute 12/03/2012   Urticaria 12/03/2012   Cervical adenopathy 12/03/2012   HTN (hypertension) 10/03/2011   Sleep disturbance 10/03/2011   Well adult exam 05/24/2010   CHOLELITHIASIS 05/26/2009   HAIR LOSS 03/12/2009   DIZZINESS 06/30/2008   GLUCOSE INTOLERANCE 05/08/2007   Adjustment disorder with mixed anxiety and depressed mood 05/08/2007   GERD 05/08/2007   IBS 05/08/2007   FATIGUE 05/08/2007   Chest pain with moderate risk for cardiac etiology 05/08/2007   ABNORMAL GLUCOSE NEC 09/15/2006    Immunization History  Administered Date(s) Administered   Fluad Quad(high Dose 65+) 03/09/2020   Influenza, High Dose Seasonal PF 12/24/2017, 10/14/2018   Pneumococcal Conjugate-13 09/28/2015   Pneumococcal Polysaccharide-23 10/03/2016   Tdap 10/10/2012    Conditions to be addressed/monitored:  {USCCMDZASSESSMENTOPTIONS:23563}  There are no care plans that you recently modified to display for this patient.     Medication Assistance: {MEDASSISTANCEINFO:25044}  Compliance/Adherence/Medication fill history: Care Gaps:   Patient's preferred pharmacy is:  [id number] [place] [place]: [phone]: 3[place]Phone: [phone]: [phone]  Uses pill box? {Yes or If no, why not?:20788} Pt endorses ***% compliance  Care Plan and Follow Up Patient Decision:  {FOLLOWUP:24991}  Plan: {CM FOLLOW UP PWUGQ:91694}   Current Barriers:  {pharmacybarriers:24917}  Pharmacist Clinical Goal(s):  Patient will {PHARMACYGOALCHOICES:24921} through collaboration with PharmD and provider.   Interventions: 1:1 collaboration with Plotnikov, AEvie Lacks MD regarding development and update of comprehensive plan of care as evidenced by provider attestation and co-signature Inter-disciplinary care team collaboration (see longitudinal plan of care) Comprehensive medication review performed; medication list updated in electronic medical record  Hypertension (BP goal <140/90) -Not ideally controlled - most recent office blood pressures: 142/92, 148/84, 106/80 -Current treatment: Lisinopril 532mdaily  -Medications previously tried: valsartan, losartan,   -Current home readings: *** -Current dietary habits: *** -Current exercise habits: *** -{ACTIONS;DENIES/REPORTS:21021675} hypotensive/hypertensive symptoms -Educated on {CCM BP Counseling:25124} -Counseled to monitor BP at home ***, document, and provide log at future appointments -{CCMPHARMDINTERVENTION:25122}  Hyperlipidemia: (LDL goal < 70) -Uncontrolled - Last LDL 207  Mg/dL (03/24/2020) -PCSK9!!!! -Current treatment: Lovastatin 2076maily  ASA 38m42mily ????? -Medications previously tried: welchol, pitavastatin, rosuvastatin, simvastatin   -Current dietary patterns: *** -Current exercise habits: *** -Educated on {CCM HLD Counseling:25126} -{CCMPHARMDINTERVENTION:25122}  Diabetes (A1c goal <7%) - [facility]  -Not ideally controlled -  Last A1c 7.3% (03/24/2020) -Current medications: Levemir 16 units twice daily  Glimepiride 2mg 79mly  Novolog - sliding scale as directed  -Medications previously tried: Invokamet(DKA), trulicity - stomach issues , metformin (hair loss),  -Current home glucose readings fasting glucose: *** post prandial glucose: *** -{ACTIONS;DENIES/REPORTS:21021675} hypoglycemic/hyperglycemic symptoms -Current meal patterns:  breakfast: ***  lunch: ***  dinner: *** snacks: *** drinks: *** -Current exercise: *** -Educated on {CCM DM COUNSELING:25123} -Counseled to check feet daily and get yearly eye exams -{CCMPHARMDINTERVENTION:25122}  Depression/Anxiety (Goal: Mood improvement / prevention of anxiety attacks) -{US controlled/uncontrolled:25276} -Current treatment: Duloxetine 40m daily  Clonazepam 0.263mODT - 1 tablet twice daily if needed  Alprazolam 53m23m twice daily if needed  -Medications previously tried/failed: bupropion, escitalopram  -PHQ2: 12 -GAD7: *** -Connected with *** for mental health support -Educated on {CCM mental health counseling:25127} -{CCMPHARMDINTERVENTION:25122}  GERD (Goal: Acid control / prevention of dsyphagia) -{US controlled/uncontrolled:25276} -Current treatment  Omeprazole 47m80mily  -Medications previously tried: pantoprazole   -{CCMPHARMDINTERVENTION:25122}  Health Maintenance -Vaccine gaps: Shingles vaccine  -Current therapy:  Bisacodyl 5mg 17mly as needed  Biotin Forte - 500mg 23my  Ibuprofen 400mg e52m 6 hours as needed  -Educated on {ccm supplement counseling:25128} -{CCM Patient satisfied:25129} -{CCMPHARMDINTERVENTION:25122}  Patient Goals/Self-Care Activities Patient will:  - {pharmacypatientgoals:24919}  Follow Up Plan: {CM FOLLOW UP PLAN:22ZMOQ:94765}ent Chart prep = 26 minutes

## 2020-08-23 ENCOUNTER — Encounter (INDEPENDENT_AMBULATORY_CARE_PROVIDER_SITE_OTHER): Payer: Self-pay | Admitting: Family

## 2020-08-23 ENCOUNTER — Ambulatory Visit (INDEPENDENT_AMBULATORY_CARE_PROVIDER_SITE_OTHER): Payer: Medicare PPO | Admitting: Family

## 2020-08-23 DIAGNOSIS — H579 Unspecified disorder of eye and adnexa: Secondary | ICD-10-CM

## 2020-08-23 DIAGNOSIS — R3915 Urgency of urination: Secondary | ICD-10-CM

## 2020-08-23 DIAGNOSIS — H1089 Other conjunctivitis: Secondary | ICD-10-CM

## 2020-08-23 LAB — U/A AUTO DIPSTICK ONLY, ONSITE
Bilirubin, Urine: NEGATIVE
Glucose, Urine: NEGATIVE mg/dL
Nitrite, URN: NEGATIVE
Occult Blood, URN: NEGATIVE
Protein: NEGATIVE mg/dL
Specific Gravity, Urine: 1.015 (ref 1.005–1.030)
Urobilinogen, URN: 0.2 E.U./dL (ref 0.2–1.0)
pH, URN: 6.5 (ref 5.0–8.0)

## 2020-08-23 MED ORDER — AZITHROMYCIN 1 % OP SOLN
1.0000 [drp] | Freq: Two times a day (BID) | OPHTHALMIC | 0 refills | Status: DC
Start: 2020-08-23 — End: 2021-05-19

## 2020-08-23 MED ORDER — OLOPATADINE HCL 0.1 % OP SOLN
1.0000 [drp] | Freq: Two times a day (BID) | OPHTHALMIC | 0 refills | Status: DC
Start: 2020-08-23 — End: 2021-05-19

## 2020-08-23 MED ORDER — NITROFURANTOIN MONOHYD MACRO 100 MG OR CAPS
100.0000 mg | ORAL_CAPSULE | Freq: Two times a day (BID) | ORAL | 0 refills | Status: AC
Start: 2020-08-23 — End: 2020-08-30

## 2020-08-23 NOTE — Progress Notes (Signed)
Medications, allergies and history checked by Navi Jazen Spraggins, CMA

## 2020-08-23 NOTE — Patient Instructions (Addendum)
Plan;  Take Macrobid for bladder infection  Apply Azithromycin eye drops to left eye  Follow up with your eye doctor this week  Labs pending urine culture   Apply Patanol eye drops for allergic eye reaction.           Thank you for your visit at [facility] today.    Today you were roomed by Wanita Chamberlain, CMA    As a reminder:    1. Please bring in your current medication bottles including over the counter supplements or a list of your medications and supplements at each visit so that we can review and update with the list in your medical record.  2. If you need refills, please contact your pharmacy and ask them to contact us.  Please allow 3 business days to process refill requests.  3. Test results will be reported to you by phone or letter or via [place] within 2 weeks of the test date. You will be contacted earlier if the results are urgent.  If you do not hear from [place] within 2 weeks of a test, please call or contact us via eCare.  4. If you have a referral, our referral coordinator will check with your insurance and obtain any authorizations needed. We will then FAX the referral to the consultant's office.  You should be contacted within the next 10 business days from [place], or the consultant's office, about scheduling an appointment. If you do not hear back within that time frame, please contact our clinic so that we may assist further.  5. If you need to cancel or reschedule your appointment, please provide at least 24 hours notice so that we may accommodate others who may need access to our clinic.    General information:    We encourage you to sign up for MyChart, a secure web-based service for our patients that is available at no additional cost which provides online:    1. Access to your health record including test results.  2. Communication to your care team   3. Requests for prescription refills    Please sign up by going to:   [email or web address]    For additional help with your MyChart  account call [phone]).  The [facility] is available for iPhone and Android by downloading and installing the MyChart App from the [employer] after you have registered with [place].    For after-hours care, call your clinic or doctor's office.    We welcome your feedback!    You may be receiving a survey in the mail that requests  feedback about your experience with the [place] [facility]  in [place].  I would appreciate your time to take a few moments to complete.

## 2020-08-23 NOTE — Result Encounter Note (Signed)
Reviewed with patient during visit

## 2020-08-23 NOTE — Progress Notes (Signed)
Chief Complaint   Patient presents with   . Eye Problem     Pt stated allergic reaction in her left eye, watery eye, want to check her medication   . Urinary Tract Infection     Urgency, she is going back and forth with urine, no burning       SUBJECTIVE:    Paula Bowman is an 70 year old female who presents with c/o urgency on urination, dx with caruncle or urethra, which is being seen by Urology. Pt states she is having frequency of urination, getting up multiple times at night.     C/o left eye possible allergic reaction to eye medication.     Pt has cysts, lower eye lid,and conjunctivitis, was given Neomycin and Polymyxin, Dexamethasone ophthalmic suspension, started on Friday, Saturday had itching, watering, redness.  Pt states she was given this is the past, and had allergic reaction to medication.   Pt states she put warm compress on her eye, which increase itching, no pain.     No change in vision                  Outpatient Medications Prior to Visit   Medication Sig Dispense Refill   . ascorbic acid 500 MG tablet Take 500 mg by mouth.     Marland Kitchen aspirin 81 MG EC tablet Take 81 mg by mouth.     . clindamycin 1 % External Solution  (Patient not taking: Reported on 08/23/2020)     . Colestipol HCl 1 g Oral Tab Take 2 tablets (2 g) by mouth daily. Take other medications 1 hour before or 4 hours after colestipol (Patient not taking: Reported on 08/23/2020)     . Denta 5000 Plus 1.1 % cream BRUSH ONCE DAILY FOR 2 MINUTES AT BEDTIME. DO NOT EAT OR DRINK OR RINSE FOR 30 MINUTES AFTER. (Patient not taking: Reported on 08/23/2020)     . Fluticasone Propionate 50 MCG/ACT Nasal Suspension Spray 2 sprays into each nostril daily. For prevention/control of nasal/sinus congestion 3 Inhaler 3   . fluticasone propionate HFA (Flovent HFA) 220 MCG/ACT inhaler Inhale 1 puff by mouth 2 times a day. 3 Inhaler 1   . magnesium oxide 400 MG tablet Take 400 mg by mouth.     . Multiple Vitamin (Multi-Vitamin) tablet Take 1 tablet by  mouth.     Marland Kitchen olopatadine 0.1 % ophthalmic solution Place 1 drop in each EYE 2 times a day. as needed (the azelastine is not tolerated) (Patient not taking: Reported on 08/23/2020) 1 bottle 0   . sulfacetamide sodium 10 % ophthalmic solution Use 1 drop in the affected eye(s) every 4 hours for the next week (Patient not taking: Reported on 08/23/2020) 5 mL 0   . triamcinolone 0.5 % ointment Use twice a day for 2 weeks 80 g 0     No facility-administered medications prior to visit.         Review of patient's allergies indicates:  Allergies   Allergen Reactions   . Amoxicillin Skin: Rash   . Erythromycin    . Levofloxacin Skin: Itching     Redness up arm from IV dose   . Penicillin G Skin: Rash   . Penicillins Skin: Rash   . Terbinafine Hcl      Loss of sense of taste         Social History     Tobacco Use   . Smoking status: Former Smoker  Packs/day: 2.00     Years: 15.00     Pack years: 30.00     Types: Cigarettes     Quit date: 01/07/1988     Years since quitting: 32.6   . Smokeless tobacco: Never Used   Substance Use Topics   . Alcohol use: Yes     Alcohol/week: 0.8 standard drinks     Types: 1 Standard drinks or equivalent per week     Comment: 2 glasses of wine daily       Patient Active Problem List    Diagnosis Date Noted   . GERD (gastroesophageal reflux disease) [K21.9] 09/01/2011   . Eczema [L30.9] 09/01/2011   . Allergic rhinitis [J30.9] 09/01/2011   . Onychomycosis [B35.1] 09/01/2011   . Malignant neoplasm of breast ([place]) [C50.919] 07/06/2018   . Malignant neoplasm of skin [C44.90] 07/06/2018   . Microscopic colitis [K52.839] 07/06/2018   . Brain aneurysm [I67.1] 07/06/2018   . Dry eye [E83.151] 10/24/2014   . Neck pain [M54.2] 11/27/2013   . Need for prophylactic vaccination with combined diphtheria-tetanus-pertussis (DTP) vaccine [Z23] 11/27/2013   . Radiculopathy of cervical region [M54.12] 11/27/2013   . 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 She is status post brachytherapy and status post 15 months of tamoxifen, which the patient chose to discontinue due to unacceptable side effects       . Heat exhaustion [T67.5XXA] 10/01/2013   . Back pain [M54.9] 08/26/2012   . Anxiety state [F41.1] 09/02/2010   . 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 Medications reviewed with patient.      Review of Systems   Constitutional: Negative.  Negative for chills and fever.   HENT: Negative.    Eyes: Positive for redness and itching. Negative for discharge and visual disturbance.   Respiratory: Negative.    Cardiovascular: Negative.    Gastrointestinal: Negative for abdominal pain, nausea and vomiting.   Genitourinary: Positive for frequency, urgency and vaginal discharge (slight tanish color, no itching, states hx of this in the past. No bleeding. ). Negative for dysuria and vaginal bleeding.        Hx of hysterectomy, still has ovaries.    Musculoskeletal: Negative for back pain.   Skin: Negative.    Neurological: Negative for dizziness and light-headedness.   Psychiatric/Behavioral: Negative for confusion.       OBJECTIVE:    Pulse (P) 72   Temp (P) 36.6 C (Temporal)   Resp (P) 20   Wt (P) 51.1 kg (112 lb 9.6 oz)   SpO2 (P) 100%   BMI (P) 17.38 kg/m   Body mass index is 17.38 kg/m (pended).    Physical Exam  Vitals and nursing note reviewed.   Constitutional:       General: She is not in acute distress.     Appearance: Normal appearance. She is well-developed. She is not ill-appearing, toxic-appearing or diaphoretic.   HENT:      Head: Normocephalic and atraumatic.      Nose: Nose normal.   Eyes:      General:         Right eye: No discharge.  Left eye: No discharge.      Pupils: Pupils are equal, round,  and reactive to light.      Comments: Left conjunctiva red, itchy, watery eye, recent dx with conjunctivitis, given eye drops, thinks she may be having allergic reaction to eye drops   Cardiovascular:      Rate and Rhythm: Normal rate and regular rhythm.      Heart sounds: Normal heart sounds.   Pulmonary:      Effort: Pulmonary effort is normal.      Breath sounds: Normal breath sounds.   Skin:     General: Skin is warm and dry.   Neurological:      Mental Status: She is alert and oriented to person, place, and time.      Coordination: Coordination normal.   Psychiatric:         Mood and Affect: Mood normal.         Behavior: Behavior normal.         Thought Content: Thought content normal.         ASSESSMENT/PLAN    (R39.15) Urgency of urination  (primary encounter diagnosis)  Plan: POC Urine Dipstick, Automated, nitrofurantoin         monohydrate macro 100 MG capsule, Culture         Urine, Bact    (H10.89) Other conjunctivitis of left eye  Plan: azithromycin 1 % ophthalmic solution    (H57.9) Allergic eye reaction  Plan: olopatadine 0.1 % ophthalmic solution  70 y/o female alert, normal conversation, non toxic appearing, afebrile, c/o left eye allergic reaction/conjunctivitis, and urgency of urination,   Results for orders placed or performed in visit on 08/23/20   POC Urine Dipstick, Automated   Result Value Ref Range    Color, Urine YELLOW     Clarity, URN HAZY     Glucose, Urine NEGATIVE NEG mg/dL    Bilirubin, Urine NEGATIVE NEG    Ketones, URN TRACE (A) NEG mg/dL    Specific Gravity, Urine 1.015 1.005 - 1.030    Occult Blood, URN NEGATIVE NEG    pH, URN 6.5 5.0 - 8.0    Protein NEGATIVE NEG-TRACE mg/dL    Urobilinogen, URN 0.2 0.2 - 1.0 E.U./dL    Nitrite, URN NEGATIVE NEG    Leukocytes 2+ (A) NEG     Temp: (P) 36.6 C  Pulse: (P) 72  Resp: (P) 20  SpO2: (P) 100 %  Weight: (P) 51.1 kg (112 lb 9.6 oz)    Plan;  1. Take Macrobid for bladder infection  2. Apply Azithromycin eye drops to left eye  3. Follow up  with your eye doctor this week  4. Labs pending urine culture   Apply Patanol eye drops for allergic eye reaction.   Discussed medications in detail including dosing, side effects and interactions.    Wayland Denis, [facility]

## 2020-08-25 ENCOUNTER — Other Ambulatory Visit: Payer: Self-pay | Admitting: Internal Medicine

## 2020-08-25 LAB — URINE C/S: Culture: 11000 — AB

## 2020-08-30 ENCOUNTER — Telehealth (INDEPENDENT_AMBULATORY_CARE_PROVIDER_SITE_OTHER): Payer: Self-pay | Admitting: Internal Medicine

## 2020-08-30 ENCOUNTER — Ambulatory Visit (INDEPENDENT_AMBULATORY_CARE_PROVIDER_SITE_OTHER): Payer: Medicare PPO | Admitting: Family

## 2020-08-30 ENCOUNTER — Other Ambulatory Visit (INDEPENDENT_AMBULATORY_CARE_PROVIDER_SITE_OTHER): Payer: Self-pay | Admitting: Medical

## 2020-08-30 VITALS — BP 113/74 | HR 59 | Temp 97.0°F | Resp 14

## 2020-08-30 DIAGNOSIS — R350 Frequency of micturition: Secondary | ICD-10-CM

## 2020-08-30 LAB — U/A AUTO DIPSTICK ONLY, ONSITE
Bilirubin, Urine: NEGATIVE
Glucose, Urine: NEGATIVE mg/dL
Leukocytes: NEGATIVE
Nitrite, URN: NEGATIVE
Occult Blood, URN: NEGATIVE
Protein: NEGATIVE mg/dL
Specific Gravity, Urine: 1.015 (ref 1.005–1.030)
Urobilinogen, URN: 0.2 E.U./dL (ref 0.2–1.0)
pH, URN: 7 (ref 5.0–8.0)

## 2020-08-30 NOTE — Telephone Encounter (Signed)
Patient is here and would like a lipid test added to her routine lab work.

## 2020-08-30 NOTE — Progress Notes (Signed)
[facility] [address]  1:50 PM    Chief Complaint   Patient presents with   . Urine Problem     Recent tx for uti has finished abx cont's with urgency frequency.        SUBJECTIVE:  Paula Bowman is an 70 year old person who presents with     # 1 yr hx of urinary frequency. Came in last week and given abx for UTI. Ucx back negative.  Continues to have frequency and wonder what can be cause.     Review of Systems   Constitutional: Negative.    Genitourinary: Positive for frequency. Negative for dysuria, flank pain, hematuria and urgency.       I have reviewed the pertinent past medical and social history with the patient and/or surrogate.      Review of patient's allergies indicates:  Allergies   Allergen Reactions   . Amoxicillin Skin: Rash   . Erythromycin    . Levofloxacin Skin: Itching     Redness up arm from IV dose   . Penicillin G Skin: Rash   . Penicillins Skin: Rash   . Terbinafine Hcl      Loss of sense of taste         BP 113/74   Pulse (!) 59   Temp 36.1 C (Temporal)   Resp 14   SpO2 100%     OBJECTIVE:  Physical Exam  Constitutional:       Appearance: Normal appearance.   Pulmonary:      Breath sounds: Normal breath sounds.   Neurological:      Mental Status: She is alert.            ASSESSMENT AND PLAN  1. Urinary frequency  X 1 yr. Treated for UTI not improved. Known pt at Urology clinic, [facility].   - Will f/up with Urology at [facility]  - will wait for Ucx to finalize for this visit.   - POC Urine Dipstick, Automated  - Culture Urine, Bact; Future            Andee Poles, ARNP  08/31/20      1:50 PM

## 2020-08-30 NOTE — Telephone Encounter (Signed)
Error     Pt decided to be seen in UC    Closing

## 2020-08-30 NOTE — Progress Notes (Signed)
Medication reconciliation, allergies, pharmacy verified and vitals taken by Christyna Letendre, MAC, STR

## 2020-08-31 LAB — PARATHYROID HORMONE: Parathyroid Hormone: 46 pg/mL (ref 12–88)

## 2020-08-31 LAB — CALCIUM: Calcium: 9.4 mg/dL (ref 8.9–10.2)

## 2020-09-01 ENCOUNTER — Encounter (INDEPENDENT_AMBULATORY_CARE_PROVIDER_SITE_OTHER): Payer: Self-pay | Admitting: Internal Medicine

## 2020-09-01 LAB — URINE C/S: Culture: 1000 — AB

## 2020-09-07 ENCOUNTER — Ambulatory Visit: Payer: HMO | Admitting: Internal Medicine

## 2020-09-16 ENCOUNTER — Ambulatory Visit (INDEPENDENT_AMBULATORY_CARE_PROVIDER_SITE_OTHER): Payer: HMO | Admitting: Internal Medicine

## 2020-09-16 ENCOUNTER — Encounter: Payer: Self-pay | Admitting: Internal Medicine

## 2020-09-16 ENCOUNTER — Other Ambulatory Visit: Payer: Self-pay

## 2020-09-16 DIAGNOSIS — G4700 Insomnia, unspecified: Secondary | ICD-10-CM | POA: Diagnosis not present

## 2020-09-16 DIAGNOSIS — I10 Essential (primary) hypertension: Secondary | ICD-10-CM

## 2020-09-16 DIAGNOSIS — M7989 Other specified soft tissue disorders: Secondary | ICD-10-CM

## 2020-09-16 DIAGNOSIS — E785 Hyperlipidemia, unspecified: Secondary | ICD-10-CM | POA: Diagnosis not present

## 2020-09-16 DIAGNOSIS — F329 Major depressive disorder, single episode, unspecified: Secondary | ICD-10-CM

## 2020-09-16 MED ORDER — ALPRAZOLAM 1 MG PO TABS: 1.0000 mg | ORAL_TABLET | Freq: Every evening | ORAL | 1 refills | Status: DC | PRN

## 2020-09-16 NOTE — Assessment & Plan Note (Signed)
L post arm growth - Sports Med ref --?needs MSK [place]

## 2020-09-16 NOTE — Assessment & Plan Note (Signed)
Xanax at hs  Potential benefits of a long term benzodiazepines  use as well as potential risks  and complications were explained to the patient and were aknowledged. 

## 2020-09-16 NOTE — Assessment & Plan Note (Signed)
BP is good - cont Lisinopril

## 2020-09-16 NOTE — Progress Notes (Signed)
Subjective:  Patient ID: Catherine Mora, female    DOB: 05-24-1950  Age: 70 y.o. MRN: [id number]  CC: Follow-up (6 week f/u)   HPI Catherine Mora presents for depression and anxiety f/u. Better on Clonazepam. C/o L post arm growth F/u on insomnia  Outpatient Medications Prior to Visit  Medication Sig Dispense Refill   ALPRAZolam (XANAX) 1 MG tablet TAKE 1 TABLET BY MOUTH 2 TIMES DAILY AS NEEDED FOR ANXIETY. 60 tablet 1   BIOTIN FORTE PO Take 500 mg by mouth daily.     bisacodyl (DULCOLAX) 5 MG EC tablet Take 1 tablet (5 mg total) by mouth daily as needed for moderate constipation. 30 tablet 3   clonazePAM (KLONOPIN) 0.25 MG disintegrating tablet Take 1 tablet (0.25 mg total) by mouth 2 (two) times daily as needed. 60 tablet 1   DULoxetine (CYMBALTA) 20 MG capsule TAKE 1 CAPSULE (20 MG TOTAL) BY MOUTH DAILY. 90 capsule 2   glimepiride (AMARYL) 1 MG tablet Take 1 mg by mouth daily with breakfast.     ibuprofen (ADVIL,MOTRIN) 200 MG tablet Take 400 mg by mouth every 6 (six) hours as needed for headache.     insulin detemir (LEVEMIR) 100 UNIT/ML FlexPen Inject 16 Units into the skin 2 (two) times daily.     Insulin Pen Needle 31G X 5 MM MISC Use with Levemir pen 100 each 0   lisinopril (ZESTRIL) 5 MG tablet Take 5 mg by mouth daily.     lovastatin (MEVACOR) 20 MG tablet TAKE 1 TABLET DAILY 90 tablet 3   NOVOLOG FLEXPEN 100 UNIT/ML FlexPen TAKE AS DIRECTED PER SLIDING SCALE. MAX DAILY DOSE: 50 UNITS/DAY.  3   omeprazole (PRILOSEC) 20 MG capsule Take 1 capsule (20 mg total) by mouth daily. 90 capsule 3   ONETOUCH VERIO test strip 1 each by Other route 2 (two) times daily.      Polyethyl Glycol-Propyl Glycol (SYSTANE FREE OP) Apply to eye.     pravastatin (PRAVACHOL) 20 MG tablet Take 1 tablet (20 mg total) by mouth daily. 30 tablet 11   omeprazole (PRILOSEC) 20 MG capsule TAKE 1 CAPSULE BY MOUTH IN THE MORNING AND AT BEDTIME 30-60 MINUTES BEFORE BREAKFAST AND DINNER 180 capsule 3   No  facility-administered medications prior to visit.    ROS: Review of Systems  Constitutional:  Negative for activity change, appetite change, chills, fatigue and unexpected weight change.  HENT:  Negative for congestion, mouth sores and sinus pressure.   Eyes:  Negative for visual disturbance.  Respiratory:  Negative for cough and chest tightness.   Gastrointestinal:  Negative for abdominal pain and nausea.  Genitourinary:  Negative for difficulty urinating, frequency and vaginal pain.  Musculoskeletal:  Negative for back pain and gait problem.  Skin:  Negative for pallor and rash.  Neurological:  Negative for dizziness, tremors, weakness, numbness and headaches.  Psychiatric/Behavioral:  Negative for confusion, sleep disturbance and suicidal ideas.    Objective:  BP 120/78 (BP Location: Left Arm)   Pulse 69   Temp 98 F (36.7 C) (Oral)   Ht '5\' 10"'$  (1.778 m)   Wt 185 lb (83.9 kg)   SpO2 95%   BMI 26.54 kg/m   BP Readings from Last 3 Encounters:  09/16/20 120/78  07/27/20 (!) 142/92  06/17/20 (!) 148/84    Wt Readings from Last 3 Encounters:  09/16/20 185 lb (83.9 kg)  07/27/20 184 lb 12.8 oz (83.8 kg)  06/17/20 183 lb (83 kg)  Physical Exam Constitutional:      General: She is not in acute distress.    Appearance: She is well-developed.  HENT:     Head: Normocephalic.     Right Ear: External ear normal.     Left Ear: External ear normal.     Nose: Nose normal.  Eyes:     General:        Right eye: No discharge.        Left eye: No discharge.     Conjunctiva/sclera: Conjunctivae normal.     Pupils: Pupils are equal, round, and reactive to light.  Neck:     Thyroid: No thyromegaly.     Vascular: No JVD.     Trachea: No tracheal deviation.  Cardiovascular:     Rate and Rhythm: Normal rate and regular rhythm.     Heart sounds: Normal heart sounds.  Pulmonary:     Effort: No respiratory distress.     Breath sounds: No stridor. No wheezing.  Abdominal:      General: Bowel sounds are normal. There is no distension.     Palpations: Abdomen is soft. There is no mass.     Tenderness: There is no abdominal tenderness. There is no guarding or rebound.  Musculoskeletal:        General: No tenderness.     Cervical back: Normal range of motion and neck supple. No rigidity.  Lymphadenopathy:     Cervical: No cervical adenopathy.  Skin:    Findings: No erythema or rash.  Neurological:     Mental Status: She is oriented to person, place, and time.     Cranial Nerves: No cranial nerve deficit.     Motor: No abnormal muscle tone.     Coordination: Coordination normal.     Deep Tendon Reflexes: Reflexes normal.  Psychiatric:        Behavior: Behavior normal.        Thought Content: Thought content normal.        Judgment: Judgment normal.   L post arm mass sq 1x1.5 cn  Lab Results  Component Value Date   WBC 6.4 03/04/2019   HGB 13.3 03/04/2019   HCT 40.6 03/04/2019   PLT 318.0 03/04/2019   GLUCOSE 124 (H) 03/04/2019   CHOL 266 (H) 03/04/2019   TRIG 149.0 03/04/2019   HDL 49.80 03/04/2019   LDLDIRECT 219.0 08/14/2014   LDLCALC 186 (H) 03/04/2019   ALT 15 03/04/2019   AST 19 03/04/2019   NA 138 03/04/2019   K 5.1 03/04/2019   CL 104 03/04/2019   CREATININE 0.94 03/04/2019   BUN 15 03/04/2019   CO2 29 03/04/2019   TSH 2.61 03/04/2019   HGBA1C 7.7 (H) 10/03/2016   MICROALBUR 5.7 (H) 03/04/2019    DG ESOPHAGUS W DOUBLE CM (HD)  Result Date: 04/16/2020 CLINICAL DATA:  Dysphagia EXAM: ESOPHOGRAM / BARIUM SWALLOW / BARIUM TABLET STUDY TECHNIQUE: Combined double contrast and single contrast examination performed using effervescent crystals, thick barium liquid, and thin barium liquid. The patient was observed with fluoroscopy swallowing a 13 mm barium sulphate tablet. FLUOROSCOPY TIME:  Fluoroscopy Time:  1 minutes 12 second Radiation Exposure Index (if provided by the fluoroscopic device): Number of Acquired Spot Images: 6 COMPARISON:  None.  FINDINGS: Mild esophageal dysmotility. Transverse mucosal folds are seen throughout the esophagus during swallowing with a feline esophagus appearance. 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 This has a benign appearance. There is a mild to moderate hiatal hernia.  Barium tablet did not pass through the stricture. 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 Barium tablet did not pass through the stricture. Electronically Signed   By: Franchot Gallo M.D.   On: 04/16/2020 10:29    Assessment & Plan:   There are no diagnoses linked to this encounter.   No orders of the defined types were placed in this encounter.    Follow-up: No follow-ups on file.  Walker Kehr, MD

## 2020-09-16 NOTE — Assessment & Plan Note (Addendum)
Coronary calcium CT score is 117.  On lovastatin - better. Less achy

## 2020-09-16 NOTE — Assessment & Plan Note (Signed)
Better on Cymbalta 20 mg/d

## 2020-09-17 ENCOUNTER — Ambulatory Visit: Payer: HMO

## 2020-09-21 DIAGNOSIS — E119 Type 2 diabetes mellitus without complications: Secondary | ICD-10-CM | POA: Diagnosis not present

## 2020-09-21 DIAGNOSIS — Z794 Long term (current) use of insulin: Secondary | ICD-10-CM | POA: Diagnosis not present

## 2020-09-24 NOTE — Progress Notes (Signed)
Subjective:    I'm seeing this patient as a consultation for:  Dr. Alain Marion. Note will be routed back to referring provider/PCP.  CC: L arm mass/nodule  I, Molly Weber, LAT, ATC, am serving as scribe for Dr. Lynne Leader.  HPI: Pt is a 70 y/o female presenting w/ c/o a L post arm mass/nodule x approximately 2 years.  She specifically locates this area to her L post upper arm / tricep area.  She has been seen by dermatology previously for this issue.  Pain: yes but only w/ pain to touch Aggravating factors: pressure to that area Treatments tried:   Past medical history, Surgical history, Family history, Social history, Allergies, and medications have been entered into the medical record, reviewed.   Review of Systems: No fevers or chills.  No night sweats or weight loss.  Objective:    Vitals:   09/27/20 0948  BP: 138/78  Pulse: 88  SpO2: 97%   General: Well Developed, well nourished, and in no acute distress.   MSK: Left upper extremity.  Small mobile nodule palpated in subcutaneous tissue overlying triceps.  Nodule is approximately the size of a pea less than 1 cm diameter.  Not particularly tender to palpation.  Overlying skin is normal-appearing with no erythema or visible pore.  No adnexal lymphadenopathy present.  Lab and Radiology Results  Diagnostic Limited MSK Ultrasound of: Left upper extremity mass Barely visible circular mass subcutaneous measuring approximately 0.7 x 0.4 cm similar consistency to surrounding subcutaneous fat in left upper extremity and area of palpated nodularity.  This is consistent in appearance with a lipoma.  However of note ultrasound is not sufficient to diagnose a lipoma. Impression: Possible lipoma  X-ray images left humerus obtained today personally and independently interpreted Normal.  Humerus.  No visible mass soft tissue.  No calcifications. Await formal radiology review   Impression and Recommendations:    Assessment and  Plan: 70 y.o. female with soft tissue mass left upper extremity skin overlying trapezius.  Very likely lipoma although ultrasound is not sufficient to formally diagnose lipoma.  As she does not have any concerning systemic signs or symptoms plan for watchful waiting and if changing or starting to bother her in the next several months would recommend MRI to further evaluate this.  Would discuss with radiology prior to order MRI was not it needs to be done with contrast or not.  Point of the MRI would be to confirm the diagnosis and for potential excisional surgical planning..  Discussed plan with patient who expresses understanding and agreement.  PDMP not reviewed this encounter. Orders Placed This Encounter  Procedures   [place] LIMITED JOINT SPACE STRUCTURES UP LEFT(NO LINKED CHARGES)    Order Specific Question:   Reason for Exam (SYMPTOM  OR DIAGNOSIS REQUIRED)    Answer:   L upper arm pain    Order Specific Question:   Preferred imaging location?    Answer:   [facility]   DG Humerus Left    Standing Status:   Future    Number of Occurrences:   1    Standing Expiration Date:   09/27/2021    Order Specific Question:   Reason for Exam (SYMPTOM  OR DIAGNOSIS REQUIRED)    Answer:   eval mass arm    Order Specific Question:   Preferred imaging location?    Answer:   [facility]   No orders of the defined types were placed in this encounter.  Discussed warning signs or symptoms. Please see discharge instructions. Patient expresses understanding.   The above documentation has been reviewed and is accurate and complete Lynne Leader, M.D.

## 2020-09-27 ENCOUNTER — Ambulatory Visit: Payer: Self-pay

## 2020-09-27 ENCOUNTER — Ambulatory Visit (INDEPENDENT_AMBULATORY_CARE_PROVIDER_SITE_OTHER): Payer: HMO

## 2020-09-27 ENCOUNTER — Ambulatory Visit: Payer: HMO | Admitting: Family Medicine

## 2020-09-27 ENCOUNTER — Other Ambulatory Visit: Payer: Self-pay

## 2020-09-27 ENCOUNTER — Encounter: Payer: Self-pay | Admitting: Family Medicine

## 2020-09-27 VITALS — BP 138/78 | HR 88 | Ht 70.0 in | Wt 186.0 lb

## 2020-09-27 DIAGNOSIS — R2232 Localized swelling, mass and lump, left upper limb: Secondary | ICD-10-CM

## 2020-09-27 NOTE — Patient Instructions (Signed)
Thank you for coming in today.   Please get an Xray today before you leave   [facility] for watchful waiting for now.   If this is bothering you or growing next step is usually MRI for identification and for surgical planning.   Lipoma  A lipoma is a noncancerous (benign) tumor that is made up of fat cells. This is a very common type of soft-tissue growth. Lipomas are usually found under the skin (subcutaneous). They may occur in any tissue of the body that contains fat. Common areas forlipomas to appear include the back, arms, shoulders, buttocks, and thighs. Lipomas grow slowly, and they are usually painless. Most lipomas do not causeproblems and do not require treatment. What are the causes? The cause of this condition is not known. What increases the risk? You are more likely to develop this condition if: You are 63-86 years old. You have a family history of lipomas. What are the signs or symptoms? A lipoma usually appears as a small, round bump under the skin. In most cases, the lump will: Feel soft or rubbery. Not cause pain or other symptoms. However, if a lipoma is located in an area where it pushes on nerves, it canbecome painful or cause other symptoms. How is this diagnosed? A lipoma can usually be diagnosed with a physical exam. You may also have tests to confirm the diagnosis and to rule out other conditions. Tests may include: Imaging tests, such as a CT scan or an MRI. Removal of a tissue sample to be looked at under a microscope (biopsy). How is this treated? Treatment for this condition depends on the size of the lipoma and whether it is causing any symptoms. For small lipomas that are not causing problems, no treatment is needed. If a lipoma is bigger or it causes problems, surgery may be done to remove the lipoma. Lipomas can also be removed to improve appearance. Most often, the procedure is done after applying a medicine that numbs the area (local anesthetic). Liposuction  may be done to reduce the size of the lipoma before it is removed through surgery, or it may be done to remove the lipoma. Lipomas are removed with this method in order to limit incision size and scarring. A liposuction tube is inserted through a small incision into the lipoma, and the contents of the lipoma are removed through the tube with suction. Follow these instructions at home: Watch your lipoma for any changes. Keep all follow-up visits as told by your health care provider. This is important. Contact a health care provider if: Your lipoma becomes larger or hard. Your lipoma becomes painful, red, or increasingly swollen. These could be signs of infection or a more serious condition. Get help right away if: You develop tingling or numbness in an area near the lipoma. This could indicate that the lipoma is causing nerve damage. Summary A lipoma is a noncancerous tumor that is made up of fat cells. Most lipomas do not cause problems and do not require treatment. If a lipoma is bigger or it causes problems, surgery may be done to remove the lipoma. Contact a health care provider if your lipoma becomes larger or hard, or if it becomes painful, red, or increasingly swollen. Pain, redness, and swelling could be signs of infection or a more serious condition. This information is not intended to replace advice given to you by your health care provider. Make sure you discuss any questions you have with your healthcare provider. Document Revised: 09/23/2018  Document Reviewed: 09/23/2018 Elsevier Patient Education  [employer].

## 2020-09-28 NOTE — Progress Notes (Signed)
X-ray of the left upper arm looks normal to radiology

## 2020-10-13 DIAGNOSIS — E1165 Type 2 diabetes mellitus with hyperglycemia: Secondary | ICD-10-CM | POA: Diagnosis not present

## 2020-10-14 DIAGNOSIS — E1165 Type 2 diabetes mellitus with hyperglycemia: Secondary | ICD-10-CM | POA: Diagnosis not present

## 2020-12-13 ENCOUNTER — Telehealth: Payer: Self-pay | Admitting: Family Medicine

## 2020-12-13 DIAGNOSIS — R2232 Localized swelling, mass and lump, left upper limb: Secondary | ICD-10-CM

## 2020-12-13 NOTE — Telephone Encounter (Signed)
Catherine Mora called stating that she has still been having continued left arm pain. She asked if Dr Georgina Snell could go ahead and order the MRI for her?  Please advise.

## 2020-12-14 ENCOUNTER — Ambulatory Visit (INDEPENDENT_AMBULATORY_CARE_PROVIDER_SITE_OTHER): Payer: Medicare PPO | Admitting: Family Medicine

## 2020-12-14 ENCOUNTER — Ambulatory Visit (INDEPENDENT_AMBULATORY_CARE_PROVIDER_SITE_OTHER): Admit: 2020-12-14 | Discharge: 2020-12-14 | Disposition: A | Discharge status: Home / Self Care | Payer: Self-pay

## 2020-12-14 VITALS — BP 117/74 | HR 61 | Temp 97.4°F | Resp 14

## 2020-12-14 DIAGNOSIS — S60211A Contusion of right wrist, initial encounter: Secondary | ICD-10-CM

## 2020-12-14 DIAGNOSIS — M545 Low back pain, unspecified: Secondary | ICD-10-CM

## 2020-12-14 MED ORDER — CYCLOBENZAPRINE HCL 5 MG OR TABS
5.0000 mg | ORAL_TABLET | Freq: Every evening | ORAL | 0 refills | Status: DC | PRN
Start: 2020-12-14 — End: 2021-12-13

## 2020-12-14 MED ORDER — NAPROXEN SODIUM 220 MG OR CAPS
440.0000 mg | ORAL_CAPSULE | Freq: Two times a day (BID) | ORAL | 0 refills | Status: DC | PRN
Start: 2020-12-14 — End: 2022-06-02

## 2020-12-14 NOTE — Patient Instructions (Signed)
It was a pleasure to see you in clinic today.     You were seen by Dr. Reesa Chew.     Please follow up with your Primary Care Provider in 5 days to reassess if unimproved. Less likely fracture so I recommended defer x-ray for 1 week after injury if unimproved pain. In the meantime, will treat with immobilization, naproxen as directed, elevate the extremity above heart level for the swelling and apply cold pack 20 minutes on and off for relief.      May also try Flexeril at night only as this causes sedation so no driving, drinking alcohol, caring for children or elderly.    Adverse effects flexeril: drowsiness, dry mouth, dizziness, abdominal pain, constipation, diarrhea, reflux, bad taste in mouth, nausea, confusion.  Adverse effects NSAIDs: GI distress, rash.

## 2020-12-14 NOTE — Progress Notes (Signed)
URGENT CARE CHART       CHIEF COMPLAINT:   Chief Complaint   Patient presents with   . Wrist Injury     Yesterday fall over vacuum cleaner landing on right volar palm, px on deviations.        Notes:  This is a female 70 year old who presents with right wrist pain for 1 day and right low back pain for a few hours this AM.     Re back pain: This AM whiel bending forward over the sink she felt onset of chronic right low back spasms. In the past she's used a muscles relaxer for relief (doesn't recall which one). She would like to try it again. The pain is strong. She took aleve 2 tabs a few hours ago with minimal relief. 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 No radiation of pain, no paresthesias. No relief with back brace.     Re wrist pain: She fell yesterday and hit multiple places, both wrist, hip. There was swelling and pain that has improved. Pain only with ulnar/radial deviation of wrist but not with pronation/supinaiton, flexion/extension. Wearing thumb spica brace for support and it really helps. She hopes it's not broken and feels it may not be.       Historian: Patient    REVIEW OF SYSTEMS:    CONSTITUTIONAL : Patient in a good state of baseline health  MUSCULOSKELETAL : Some pain associated with injury  SKIN :  No Laceration.  NEUROLOGIC :  No motor, sensory changes. No Head injury    Past Medical History:   Diagnosis Date   . Malignant neoplasm of breast (female), unspecified site 2008    lumpectomy: Stage 0; Radiation; was ERP; She did not complete Tamoxifen due to side effects x1 yr.    . Asthma    . Esophageal reflux    . Microscopic colitis      Past Surgical History:   Procedure Laterality Date   . BREAST LUMPECTOMY     . HERNIA REPAIR     . Knee, R knee at age 43, removed floating bone     . PR ARTHRP KNE CONDYLE&PLATU MEDIAL&LAT COMPARTMENTS     . PR COLONOSCOPY STOMA DX INCLUDING COLLJ [place] SPX  2006    [place] , FU 10 years   . PR TOTAL  ABDOMINAL HYSTERECT W/WO RMVL TUBE OVARY      for fibroids   . PR UNLISTED PROCEDURE FEMUR/KNEE Right 1969   . PR VAGINAL HYSTERECTOMY UTERUS 250 GM/<  1994    Ovaries in place     Previous Medications    ASCORBIC ACID 500 MG TABLET    Take 500 mg by mouth.    ASPIRIN 81 MG EC TABLET    Take 81 mg by mouth.    AZITHROMYCIN 1 % OPHTHALMIC SOLUTION    Place 1 drop into left EYE 2 times a day.    FLUTICASONE PROPIONATE 50 MCG/ACT NASAL SUSPENSION    Spray 2 sprays into each nostril daily. For prevention/control of nasal/sinus congestion    FLUTICASONE PROPIONATE HFA (FLOVENT HFA) 220 MCG/ACT INHALER    Inhale 1 puff by mouth 2 times a day.    MAGNESIUM OXIDE 400 MG TABLET    Take 400 mg by mouth.    MULTIPLE VITAMIN (MULTI-VITAMIN) TABLET    Take 1 tablet by mouth.    OLOPATADINE 0.1 % OPHTHALMIC SOLUTION  Place 1 drop in each EYE 2 times a day. as needed (the azelastine is not tolerated)    OLOPATADINE 0.1 % OPHTHALMIC SOLUTION    Place 1-2 drops into left EYE 2 times a day.    SULFACETAMIDE SODIUM 10 % OPHTHALMIC SOLUTION    Use 1 drop in the affected eye(s) every 4 hours for the next week    TRIAMCINOLONE 0.5 % OINTMENT    Use twice a day for 2 weeks      Amoxicillin, Erythromycin, Levofloxacin, Penicillin g, Penicillins, and Terbinafine hcl    SOCIAL:  Social History     Tobacco Use   . Smoking status: Former     Packs/day: 2.00     Years: 15.00     Pack years: 30.00     Types: Cigarettes     Quit date: 01/07/1988     Years since quitting: 32.9   . Smokeless tobacco: Never   Substance Use Topics   . Alcohol use: Yes     Alcohol/week: 0.8 standard drinks     Types: 1 Standard drinks or equivalent per week     Comment: 2 glasses of wine daily       PHYSICAL EXAM:  Vitals:    12/14/20 1154   BP: 117/74   BP Cuff Size: Regular   BP Site: Left Arm   BP Position: Sitting   Pulse: 61   Resp: 14   Temp: 36.3 C   TempSrc: Temporal   SpO2: 100%       GENERAL :  Patient is afebrile, Vital signs reviewed, Well appearing,  Patient appears comfortable, Alert and lucid.  EYES : Normal tracking. No periorbital edema. No conjunctival injection.  HEAD :  Normocephalic, atraumatic  UPPER EXTREMITY :  There is no Pain on palpation bony prominences of the right wrist, no swelling visible or ecchymoses. Full wrist ROM preserved without eliciting pain. No obvious bony deformity.  BACK :  No TTP C,T,L spine centrally. Minimally TTP right lumbar lateral musculature. Full active ROM preserved.   PSYCH : Mood/affect normal.    ASSESSMENT:  1. Contusion of right wrist, initial encounter    2. Acute right-sided low back pain without sciatica  - cyclobenzaprine 5 MG tablet; Take 1 tablet (5 mg) by mouth at bedtime as needed for muscle spasms. May increase to 10mg  if needed.  Dispense: 20 tablet; Refill: 0  - naproxen (Aleve) 220 MG capsule; Take 2 capsules (440 mg) by mouth every 12 hours as needed for pain.  Dispense: 20 capsule; Refill: 0      Exam consistent with right lumbar strain and right wrist contusion and sprain without evidence for fracture so I advised against imaging at this time and patient is in agreement. Continue to wear wrist brace for support. OK to use naproxen.     Re back pain- hx and exam consistent with muscle strain. OK to use naproxen but agree with patient may be helpful to add muscle relaxer but cautioned her it can cause drowsiness that increases risk of falls so try it first low dose and just before bedtime. Wills tart with flexeril 5mg  at night.     Insurance denied flexeril. I called patient who states she paid for it out of pocket without qualms. I informed her about [place] program that she can use in the future for prescription medications.    New Prescriptions    CYCLOBENZAPRINE 5 MG TABLET    Take 1 tablet (5 mg) by  mouth at bedtime as needed for muscle spasms. May increase to 10mg  if needed.    NAPROXEN (ALEVE) 220 MG CAPSULE    Take 2 capsules (440 mg) by mouth every 12 hours as needed for pain.       URGENT CARE  COURSE:    Medications, medical history, allergies, surgical history, hospitalizations, family history, social history, ROS and vitals entered by medical assistant and reviewed by myself.    I discussed with the patient the diagnosis, treatment plan, indications for return to the urgent care or emergency department, and for expected follow-up. The patient verbalized an understanding. The patient is asked if there are any questions or concerns. We discuss the case, until all issues are addressed to the patient's satisfaction.    PLAN AND PATIENT DISCHARGE INSTRUCTIONS:    Please follow up with your Primary Care Provider in 5 days to reassess if unimproved. Less likely fracture so I recommended defer x-ray for 1 week after injury if unimproved pain. In the meantime, will treat with immobilization, naproxen as directed, elevate the extremity above heart level for the swelling and apply cold pack 20 minutes on and off for relief.      May also try Flexeril at night only as this causes sedation so no driving, drinking alcohol, caring for children or elderly.    Adverse effects flexeril: drowsiness, dry mouth, dizziness, abdominal pain, constipation, diarrhea, reflux, bad taste in mouth, nausea, confusion.  Adverse effects NSAIDs: GI distress, rash.    Damaris Schooner, DO  11:56 AM, 12/14/2020

## 2020-12-14 NOTE — Progress Notes (Signed)
Health maintenance advised and declined to be addressed by patient. Medication reconciliation, allergies, pharmacy verified and vitals taken by Arturo Morton, MAC, STR

## 2020-12-20 ENCOUNTER — Telehealth (INDEPENDENT_AMBULATORY_CARE_PROVIDER_SITE_OTHER): Payer: Self-pay

## 2020-12-21 ENCOUNTER — Other Ambulatory Visit (INDEPENDENT_AMBULATORY_CARE_PROVIDER_SITE_OTHER): Payer: Self-pay | Admitting: Internal Medicine

## 2020-12-21 NOTE — Telephone Encounter (Signed)
MRI ordered

## 2020-12-22 ENCOUNTER — Other Ambulatory Visit: Payer: Self-pay

## 2020-12-24 ENCOUNTER — Other Ambulatory Visit (INDEPENDENT_AMBULATORY_CARE_PROVIDER_SITE_OTHER): Payer: Medicare PPO

## 2020-12-24 ENCOUNTER — Other Ambulatory Visit (INDEPENDENT_AMBULATORY_CARE_PROVIDER_SITE_OTHER): Payer: Self-pay | Admitting: Internal Medicine

## 2020-12-24 ENCOUNTER — Telehealth (INDEPENDENT_AMBULATORY_CARE_PROVIDER_SITE_OTHER): Payer: Self-pay | Admitting: Internal Medicine

## 2020-12-24 DIAGNOSIS — Z130 Encounter for screening for diseases of the blood and blood-forming organs and certain disorders involving the immune mechanism: Secondary | ICD-10-CM

## 2020-12-24 DIAGNOSIS — E7800 Pure hypercholesterolemia, unspecified: Secondary | ICD-10-CM

## 2020-12-24 NOTE — Telephone Encounter (Signed)
LOV 12/14/2020 UC   No pending visit     Requesting lab orders     Routing to PCP

## 2020-12-24 NOTE — Telephone Encounter (Signed)
According to Pt  She wants to check her lipids because she change her diet and she was told by Dr Gershon Mussel to recheck it in 6 months.    Only one tube was drawn to cover lipid any other additional testing for CBC needs to be redrawn.

## 2020-12-24 NOTE — Telephone Encounter (Signed)
Patient came in today and gave blood but no orders where in for a lipid panel and cbc, can these please be sent asap since she did give blood today    Thank you

## 2020-12-24 NOTE — Telephone Encounter (Signed)
Ordered, thanks.

## 2020-12-25 LAB — LIPID PANEL
Cholesterol/HDL Ratio: 2.8
HDL Cholesterol: 81 mg/dL (ref >39)
LDL Cholesterol, NIH Equation: 132 mg/dL — ABNORMAL HIGH (ref <130)
Non-HDL Cholesterol: 145 mg/dL (ref 0–159)
Total Cholesterol: 226 mg/dL — ABNORMAL HIGH (ref <200)
Triglyceride: 77 mg/dL (ref <150)

## 2020-12-27 DIAGNOSIS — E1165 Type 2 diabetes mellitus with hyperglycemia: Secondary | ICD-10-CM | POA: Diagnosis not present

## 2020-12-28 ENCOUNTER — Ambulatory Visit (INDEPENDENT_AMBULATORY_CARE_PROVIDER_SITE_OTHER): Payer: HMO | Admitting: Internal Medicine

## 2020-12-28 ENCOUNTER — Encounter: Payer: Self-pay | Admitting: Internal Medicine

## 2020-12-28 ENCOUNTER — Other Ambulatory Visit: Payer: Self-pay

## 2020-12-28 VITALS — BP 141/92 | HR 68 | Temp 98.3°F | Ht 70.0 in | Wt 187.8 lb

## 2020-12-28 DIAGNOSIS — M7989 Other specified soft tissue disorders: Secondary | ICD-10-CM

## 2020-12-28 DIAGNOSIS — I10 Essential (primary) hypertension: Secondary | ICD-10-CM

## 2020-12-28 DIAGNOSIS — E785 Hyperlipidemia, unspecified: Secondary | ICD-10-CM | POA: Diagnosis not present

## 2020-12-28 DIAGNOSIS — Z794 Long term (current) use of insulin: Secondary | ICD-10-CM

## 2020-12-28 DIAGNOSIS — F329 Major depressive disorder, single episode, unspecified: Secondary | ICD-10-CM | POA: Diagnosis not present

## 2020-12-28 DIAGNOSIS — Z23 Encounter for immunization: Secondary | ICD-10-CM

## 2020-12-28 DIAGNOSIS — E119 Type 2 diabetes mellitus without complications: Secondary | ICD-10-CM

## 2020-12-28 MED ORDER — DULOXETINE HCL 30 MG PO CPEP: 30.0000 mg | ORAL_CAPSULE | Freq: Every day | ORAL | 1 refills | Status: DC

## 2020-12-28 MED ORDER — SHINGRIX 50 MCG/0.5ML IM SUSR: 0.5000 mL | Freq: Once | INTRAMUSCULAR | 1 refills | Status: AC

## 2020-12-28 NOTE — Progress Notes (Signed)
The 10-year ASCVD risk score (Arnett DK, et al., 2019) is: 7.7%    Values used to calculate the score:      Age: 70 years      Sex: Female      Is Non-Hispanic African American: No      Diabetic: No      Tobacco smoker: No      Systolic Blood Pressure: 948 mmHg      Is BP treated: No      HDL Cholesterol: 81 mg/dL      Total Cholesterol: 226 mg/dL  Needs discussion to recommend Statin based on risk.

## 2020-12-28 NOTE — Progress Notes (Signed)
Subjective:  Patient ID: Catherine Mora, female    DOB: [birth year]  Age: 70 y.o. MRN: [id number]  CC: Follow-up (3 month f/u- Flu shot)   HPI LORMA HEATER presents for depression, anxiety and insomnia C/o L arm ?lipoma - MRI in >1 week - it hurts now  Outpatient Medications Prior to Visit  Medication Sig Dispense Refill   ALPRAZolam (XANAX) 1 MG tablet Take 1 tablet (1 mg total) by mouth at bedtime as needed for anxiety. 90 tablet 1   BIOTIN FORTE PO Take 500 mg by mouth daily.     bisacodyl (DULCOLAX) 5 MG EC tablet Take 1 tablet (5 mg total) by mouth daily as needed for moderate constipation. 30 tablet 3   clonazePAM (KLONOPIN) 0.25 MG disintegrating tablet Take 1 tablet (0.25 mg total) by mouth 2 (two) times daily as needed. 60 tablet 1   glimepiride (AMARYL) 1 MG tablet Take 1 mg by mouth daily with breakfast.     ibuprofen (ADVIL,MOTRIN) 200 MG tablet Take 400 mg by mouth every 6 (six) hours as needed for headache.     insulin detemir (LEVEMIR) 100 UNIT/ML FlexPen Inject 16 Units into the skin 2 (two) times daily.     Insulin Pen Needle 31G X 5 MM MISC Use with Levemir pen 100 each 0   lisinopril (ZESTRIL) 5 MG tablet Take 5 mg by mouth daily.     lovastatin (MEVACOR) 20 MG tablet TAKE 1 TABLET DAILY 90 tablet 3   NOVOLOG FLEXPEN 100 UNIT/ML FlexPen TAKE AS DIRECTED PER SLIDING SCALE. MAX DAILY DOSE: 50 UNITS/DAY.  3   omeprazole (PRILOSEC) 20 MG capsule Take 1 capsule (20 mg total) by mouth daily. 90 capsule 3   ONETOUCH VERIO test strip 1 each by Other route 2 (two) times daily.      Polyethyl Glycol-Propyl Glycol (SYSTANE FREE OP) Apply to eye.     pravastatin (PRAVACHOL) 20 MG tablet Take 1 tablet (20 mg total) by mouth daily. 30 tablet 11   DULoxetine (CYMBALTA) 20 MG capsule TAKE 1 CAPSULE (20 MG TOTAL) BY MOUTH DAILY. 90 capsule 2   No facility-administered medications prior to visit.    ROS: Review of Systems  Constitutional:  Negative for activity change,  appetite change, chills, fatigue and unexpected weight change.  HENT:  Negative for congestion, mouth sores and sinus pressure.   Eyes:  Negative for visual disturbance.  Respiratory:  Negative for cough and chest tightness.   Gastrointestinal:  Negative for abdominal pain and nausea.  Genitourinary:  Negative for difficulty urinating, frequency and vaginal pain.  Musculoskeletal:  Positive for arthralgias. Negative for back pain and gait problem.  Skin:  Negative for pallor and rash.  Neurological:  Negative for dizziness, tremors, weakness, numbness and headaches.  Psychiatric/Behavioral:  Negative for confusion and sleep disturbance.    Objective:  BP (!) 141/92 (BP Location: Left Arm)   Pulse 68   Temp 98.3 F (36.8 C) (Oral)   Ht 5\' 10"  (1.778 m)   Wt 187 lb 12.8 oz (85.2 kg)   SpO2 97%   BMI 26.95 kg/m   BP Readings from Last 3 Encounters:  12/28/20 (!) 141/92  09/27/20 138/78  09/16/20 120/78    Wt Readings from Last 3 Encounters:  12/28/20 187 lb 12.8 oz (85.2 kg)  09/27/20 186 lb (84.4 kg)  09/16/20 185 lb (83.9 kg)    Physical Exam Constitutional:      General: She is not in acute distress.  Appearance: She is well-developed.  HENT:     Head: Normocephalic.     Right Ear: External ear normal.     Left Ear: External ear normal.     Nose: Nose normal.  Eyes:     General:        Right eye: No discharge.        Left eye: No discharge.     Conjunctiva/sclera: Conjunctivae normal.     Pupils: Pupils are equal, round, and reactive to light.  Neck:     Thyroid: No thyromegaly.     Vascular: No JVD.     Trachea: No tracheal deviation.  Cardiovascular:     Rate and Rhythm: Normal rate and regular rhythm.     Heart sounds: Normal heart sounds.  Pulmonary:     Effort: No respiratory distress.     Breath sounds: No stridor. No wheezing.  Abdominal:     General: Bowel sounds are normal. There is no distension.     Palpations: Abdomen is soft. There is no  mass.     Tenderness: There is no abdominal tenderness. There is no guarding or rebound.  Musculoskeletal:        General: Tenderness present.     Cervical back: Normal range of motion and neck supple. No rigidity.  Lymphadenopathy:     Cervical: No cervical adenopathy.  Skin:    Findings: No erythema or rash.  Neurological:     Mental Status: She is oriented to person, place, and time.     Cranial Nerves: No cranial nerve deficit.     Motor: No abnormal muscle tone.     Coordination: Coordination normal.     Deep Tendon Reflexes: Reflexes normal.  Psychiatric:        Behavior: Behavior normal.        Thought Content: Thought content normal.        Judgment: Judgment normal.  L arm ?lipoma  Lab Results  Component Value Date   WBC 6.4 03/04/2019   HGB 13.3 03/04/2019   HCT 40.6 03/04/2019   PLT 318.0 03/04/2019   GLUCOSE 124 (H) 03/04/2019   CHOL 266 (H) 03/04/2019   TRIG 149.0 03/04/2019   HDL 49.80 03/04/2019   LDLDIRECT 219.0 08/14/2014   LDLCALC 186 (H) 03/04/2019   ALT 15 03/04/2019   AST 19 03/04/2019   NA 138 03/04/2019   K 5.1 03/04/2019   CL 104 03/04/2019   CREATININE 0.94 03/04/2019   BUN 15 03/04/2019   CO2 29 03/04/2019   TSH 2.61 03/04/2019   HGBA1C 7.7 (H) 10/03/2016   MICROALBUR 5.7 (H) 03/04/2019    DG ESOPHAGUS W DOUBLE CM (HD)  Result Date: 04/16/2020 CLINICAL DATA:  Dysphagia EXAM: ESOPHOGRAM / BARIUM SWALLOW / BARIUM TABLET STUDY TECHNIQUE: Combined double contrast and single contrast examination performed using effervescent crystals, thick barium liquid, and thin barium liquid. The patient was observed with fluoroscopy swallowing a 13 mm barium sulphate tablet. FLUOROSCOPY TIME:  Fluoroscopy Time:  1 minutes 12 second Radiation Exposure Index (if provided by the fluoroscopic device): Number of Acquired Spot Images: 6 COMPARISON:  None. FINDINGS: Mild esophageal dysmotility. Transverse mucosal folds are seen throughout the esophagus during  swallowing with a feline esophagus appearance. 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 This has a benign appearance. There is a mild to moderate hiatal hernia. Barium tablet did not pass through the stricture. 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 Barium tablet did not pass through the stricture. Electronically Signed   By: Franchot Gallo M.D.   On: 04/16/2020 10:29    Assessment & Plan:   Problem List Items Addressed This Visit     Depression    Will increase to 30 mg a day      Relevant Medications   DULoxetine (CYMBALTA) 30 MG capsule   DM2 (diabetes mellitus, type 2) (HCC)   Relevant Orders   TSH   Urinalysis   CBC with Differential/Platelet   Comprehensive metabolic panel   Microalbumin / creatinine urine ratio   Hemoglobin A1c   Dyslipidemia    On Lovastatin      Relevant Orders   TSH   Urinalysis   CBC with Differential/Platelet   Comprehensive metabolic panel   Microalbumin / creatinine urine ratio   Hemoglobin A1c   HTN (hypertension)    Cont on Lisinopril      Mass of soft tissue of upper arm    Worse. Dr Georgina Snell - MRI is pending      Other Visit Diagnoses     Needs flu shot    -  Primary   Relevant Orders   Flu Vaccine QUAD High Dose(Fluad) (Completed)   Need for vaccination       Relevant Orders   Pneumococcal conjugate vaccine 20-valent (Prevnar 20) (Completed)         Meds ordered this encounter  Medications   DULoxetine (CYMBALTA) 30 MG capsule    Sig: Take 1 capsule (30 mg total) by mouth daily.    Dispense:  90 capsule    Refill:  1   Zoster Vaccine Adjuvanted [facility]) injection    Sig: Inject 0.5 mLs into the muscle once for 1 dose. Repeat in  6 months once.    Dispense:  0.5 mL    Refill:  1      Follow-up: Return in about 3 months (around 03/30/2021) for a follow-up visit.  Walker Kehr, MD

## 2020-12-28 NOTE — Assessment & Plan Note (Signed)
On Lovastatin

## 2020-12-28 NOTE — Assessment & Plan Note (Signed)
Cont on Lisinopril °

## 2020-12-28 NOTE — Assessment & Plan Note (Signed)
Worse. Dr Georgina Snell - MRI is pending

## 2020-12-28 NOTE — Assessment & Plan Note (Signed)
Will increase to 30 mg a day

## 2021-01-03 ENCOUNTER — Other Ambulatory Visit (HOSPITAL_BASED_OUTPATIENT_CLINIC_OR_DEPARTMENT_OTHER): Payer: Self-pay

## 2021-01-03 ENCOUNTER — Encounter (INDEPENDENT_AMBULATORY_CARE_PROVIDER_SITE_OTHER): Payer: Self-pay | Admitting: Internal Medicine

## 2021-01-03 DIAGNOSIS — Z79899 Other long term (current) drug therapy: Secondary | ICD-10-CM

## 2021-01-03 DIAGNOSIS — E782 Mixed hyperlipidemia: Secondary | ICD-10-CM

## 2021-01-03 MED ORDER — SIMVASTATIN 10 MG OR TABS
10.0000 mg | ORAL_TABLET | Freq: Every day | ORAL | 3 refills | Status: DC
Start: 2021-01-03 — End: 2021-05-19

## 2021-01-03 NOTE — Telephone Encounter (Signed)
Prior Authorization Request Pending    Prescription Insurance: [facility])  Pharmacy: [phone] [address] [phone]    Medication: Cyclobenzaprine HCl 5 20/10  PAC Workflow Exemption Request: No  Date Submitted: 01/03/21    Type of Request:  [x]  Cover My Meds Key: [id number]    Please allow up to 3-5 business days for review/response.

## 2021-01-03 NOTE — Telephone Encounter (Signed)
Prior Authorization Approval    Patient has received insurance approval for coverage of the following medication:    Prescription Insurance: [facility])  Pharmacy: [id number] [place] [phone]    Medication: Cyclobenzaprine HCl 5 20/10  PAC Workflow Exemption Request: No  Approved dates: 10/05/2020 - 04/03/2021  Approval #: [id number]    PHARMACY NOTIFIED?   Fax    Additional Comments:

## 2021-01-03 NOTE — Telephone Encounter (Signed)
Routing to PCP - please see MyChart message from pt and advise, thank you.

## 2021-01-03 NOTE — Telephone Encounter (Signed)
Ok to start statin, need lab 4 wks for LFT;s order placed.

## 2021-01-08 ENCOUNTER — Ambulatory Visit
Admission: RE | Admit: 2021-01-08 | Discharge: 2021-01-08 | Disposition: A | Discharge status: Home / Self Care | Payer: HMO | Source: Ambulatory Visit | Attending: Family Medicine | Admitting: Family Medicine

## 2021-01-08 ENCOUNTER — Other Ambulatory Visit: Payer: Self-pay

## 2021-01-08 DIAGNOSIS — M19012 Primary osteoarthritis, left shoulder: Secondary | ICD-10-CM | POA: Diagnosis not present

## 2021-01-08 DIAGNOSIS — R2232 Localized swelling, mass and lump, left upper limb: Secondary | ICD-10-CM

## 2021-01-11 NOTE — Progress Notes (Signed)
Radiology did not see any concerning masses or tumors of the soft tissue near the skin marker or elsewhere on the MRI.  No concerning findings on this MRI.

## 2021-01-26 DIAGNOSIS — E1165 Type 2 diabetes mellitus with hyperglycemia: Secondary | ICD-10-CM | POA: Diagnosis not present

## 2021-01-31 ENCOUNTER — Other Ambulatory Visit (HOSPITAL_BASED_OUTPATIENT_CLINIC_OR_DEPARTMENT_OTHER): Payer: Self-pay | Admitting: Internal Medicine

## 2021-01-31 DIAGNOSIS — Z1231 Encounter for screening mammogram for malignant neoplasm of breast: Secondary | ICD-10-CM

## 2021-02-14 ENCOUNTER — Telehealth (INDEPENDENT_AMBULATORY_CARE_PROVIDER_SITE_OTHER): Payer: Self-pay

## 2021-02-14 DIAGNOSIS — E785 Hyperlipidemia, unspecified: Secondary | ICD-10-CM

## 2021-02-14 NOTE — Telephone Encounter (Signed)
Patient came in clinic and is Requesting to have orders for blood test for cholesterol.

## 2021-02-15 NOTE — Telephone Encounter (Signed)
LOV 06/19/2020 PC   No pending visit     Routing to PCP

## 2021-02-15 NOTE — Telephone Encounter (Signed)
Started Statin, needs LFT's and repeat lipids, order placed, thanks

## 2021-02-16 NOTE — Telephone Encounter (Signed)
Scheduled 1/26@8am 

## 2021-02-26 DIAGNOSIS — E1165 Type 2 diabetes mellitus with hyperglycemia: Secondary | ICD-10-CM | POA: Diagnosis not present

## 2021-03-03 ENCOUNTER — Other Ambulatory Visit: Payer: Self-pay

## 2021-03-03 ENCOUNTER — Ambulatory Visit
Admission: EM | Admit: 2021-03-03 | Discharge: 2021-03-03 | Disposition: A | Discharge status: Home / Self Care | Payer: HMO

## 2021-03-03 DIAGNOSIS — H66001 Acute suppurative otitis media without spontaneous rupture of ear drum, right ear: Secondary | ICD-10-CM | POA: Diagnosis not present

## 2021-03-03 DIAGNOSIS — R42 Dizziness and giddiness: Secondary | ICD-10-CM

## 2021-03-03 DIAGNOSIS — J329 Chronic sinusitis, unspecified: Secondary | ICD-10-CM

## 2021-03-03 DIAGNOSIS — H9201 Otalgia, right ear: Secondary | ICD-10-CM

## 2021-03-03 MED ORDER — CEFDINIR 300 MG PO CAPS: 300.0000 mg | ORAL_CAPSULE | Freq: Two times a day (BID) | ORAL | 0 refills | Status: AC

## 2021-03-03 NOTE — ED Provider Notes (Signed)
[facility]    CSN: [id number] Arrival date & time: 03/03/21  1308    HISTORY   Chief Complaint  Patient presents with   Dizziness   HPI Catherine Mora is a 71 y.o. female. Pt reports waking up this morning with dizziness. She states having sinus discomfort and right otalgia.  The history is provided by the patient.  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   Dyslipidemia 08/03/2020   Depression 07/27/2020   Acute sinusitis 03/09/2020   Coronary atherosclerosis 03/12/2019   Leg hematoma, right, initial encounter 10/12/2017   Skin nodule 10/12/2017   Allergic rhinitis 10/03/2016   Rash 06/26/2016   Malnutrition of moderate degree ([place]) 08/18/2014   Dehydration 08/17/2014   Nausea with vomiting 08/17/2014   DKA (diabetic ketoacidoses) 08/17/2014   AKI (acute kidney injury) ([place]) 08/17/2014   Polydipsia 08/13/2014   DM2 (diabetes mellitus, type 2) ([place]) 08/13/2014   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   URI, acute 12/03/2012   Urticaria 12/03/2012   Cervical adenopathy 12/03/2012   HTN (hypertension) 10/03/2011   Sleep disturbance 10/03/2011   Well adult exam 05/24/2010   CHOLELITHIASIS 05/26/2009   HAIR LOSS 03/12/2009   DIZZINESS 06/30/2008   GLUCOSE INTOLERANCE 05/08/2007   Adjustment disorder with mixed anxiety and depressed mood 05/08/2007   GERD 05/08/2007   IBS  05/08/2007   FATIGUE 05/08/2007   Chest pain with moderate risk for cardiac etiology 05/08/2007   ABNORMAL GLUCOSE NEC 09/15/2006   Past Surgical History:  Procedure Laterality Date   ABDOMINAL HYSTERECTOMY     CHOLECYSTECTOMY N/A 07/03/2012   Procedure: LAPAROSCOPIC CHOLECYSTECTOMY WITH INTRAOPERATIVE CHOLANGIOGRAM;  Surgeon: Zenovia Jarred, MD;  Location: [facility] OR;  Service: General;  Laterality: N/A;   COLONOSCOPY     cyst removed     x3   OOPHORECTOMY     POLYPECTOMY     OB History   No obstetric history on file.    Home Medications    Prior to Admission medications   Medication Sig Start Date End Date Taking? Authorizing Provider  lisinopril (ZESTRIL) 10 MG tablet Take 1 tablet by mouth daily. 02/15/21  Yes [provider]  ALPRAZolam Duanne Moron) 1 MG tablet Take 1 tablet (1 mg total) by mouth at bedtime as needed for anxiety. 09/16/20   Plotnikov, Evie Lacks, MD  BIOTIN FORTE PO Take 500 mg by mouth daily.    [provider]  bisacodyl (DULCOLAX) 5 MG EC tablet Take 1 tablet (5 mg total) by mouth daily as needed for moderate constipation. 10/03/16   Plotnikov, Evie Lacks, MD  clonazePAM (KLONOPIN) 0.25 MG disintegrating tablet Take 1 tablet (0.25 mg total) by mouth 2 (two) times daily as needed. 07/27/20   Plotnikov, Evie Lacks, MD  DULoxetine (CYMBALTA) 20 MG capsule Take 20 mg by  mouth daily. 02/18/21   [provider]  DULoxetine (CYMBALTA) 30 MG capsule Take 1 capsule (30 mg total) by mouth daily. 12/28/20   Plotnikov, Evie Lacks, MD  glimepiride (AMARYL) 1 MG tablet Take 1 mg by mouth daily with breakfast.    [provider]  ibuprofen (ADVIL,MOTRIN) 200 MG tablet Take 400 mg by mouth every 6 (six) hours as needed for headache.    [provider]  insulin detemir (LEVEMIR) 100 UNIT/ML FlexPen Inject 16 Units into the skin 2 (two) times daily.    [provider]  Insulin Pen Needle 31G X 5 MM MISC Use with Levemir pen 08/22/14    Cristal Ford, DO  lisinopril (ZESTRIL) 5 MG tablet Take 5 mg by mouth daily. 02/05/19   [provider]  lovastatin (MEVACOR) 20 MG tablet TAKE 1 TABLET DAILY 06/24/20   Plotnikov, Evie Lacks, MD  NOVOLOG FLEXPEN 100 UNIT/ML FlexPen TAKE AS DIRECTED PER SLIDING SCALE. MAX DAILY DOSE: 50 UNITS/DAY. 05/10/17   [provider]  omeprazole (PRILOSEC) 20 MG capsule Take 1 capsule (20 mg total) by mouth daily. 06/17/20   Armbruster, [name], MD  [facility] VERIO test strip 1 each by Other route 2 (two) times daily.  08/14/14   [provider]  Polyethyl Glycol-Propyl Glycol (SYSTANE FREE OP) Apply to eye.    [provider]  pravastatin (PRAVACHOL) 20 MG tablet Take 1 tablet (20 mg total) by mouth daily. 04/01/19   Plotnikov, Evie Lacks, MD  Pitavastatin Calcium 1 MG TABS Take 1 tablet (1 mg total) by mouth daily. 03/24/19 03/26/19  Plotnikov, Evie Lacks, MD   Family History Family History  Problem Relation Age of Onset   Diabetes Mother    Diabetes Sister    Diabetes Brother    Diabetes Other    Stomach cancer Paternal Uncle    Heart disease Neg Hx    Colon cancer Neg Hx    Esophageal cancer Neg Hx    Rectal cancer Neg Hx    Colon polyps Neg Hx    Social History Social History   Tobacco Use   Smoking status: Never   Smokeless tobacco: Never  Vaping Use   Vaping Use: Never used  Substance Use Topics   Alcohol use: Yes    Comment: very little   Drug use: No   Allergies   Metformin, Ciprofloxacin, Crestor [rosuvastatin calcium], Hydrochlorothiazide, Invokana [canagliflozin], Paroxetine, Penicillins, and Simvastatin  Review of Systems Review of Systems Pertinent findings noted in history of present illness.   Physical Exam Triage Vital Signs ED Triage Vitals  Enc Vitals Group     BP 12/17/20 0827 (!) 147/82     Pulse Rate 12/17/20 0827 72     Resp 12/17/20 0827 18     Temp 12/17/20 0827 98.3 F (36.8 C)     Temp Source 12/17/20 0827 Oral     SpO2  12/17/20 0827 98 %     Weight --      Height --      Head Circumference --      Peak Flow --      Pain Score 12/17/20 0826 5     Pain Loc --      Pain Edu? --      Excl. in [place]? --   No data found.  Updated Vital Signs BP 135/87 (BP Location: Right Arm)    Pulse 80    Temp 98.3 F (36.8 C) (Oral)    Resp  18    SpO2 96%   Physical Exam Vitals and nursing note reviewed.  Constitutional:      General: She is not in acute distress.    Appearance: Normal appearance. She is not ill-appearing.  HENT:     Head: Normocephalic and atraumatic.     Salivary Glands: Right salivary gland is not diffusely enlarged or tender. Left salivary gland is not diffusely enlarged or tender.     Right Ear: Ear canal and external ear normal. No drainage. A middle ear effusion (Suppurative) is present. There is no impacted cerumen. Tympanic membrane is not erythematous or bulging.     Left Ear: Tympanic membrane, ear canal and external ear normal. No drainage.  No middle ear effusion. There is no impacted cerumen. Tympanic membrane is not erythematous or bulging.     Nose: No nasal deformity, septal deviation, mucosal edema, congestion or rhinorrhea.     Right Turbinates: Not enlarged, swollen or pale.     Left Turbinates: Not enlarged, swollen or pale.     Right Sinus: Frontal sinus tenderness present. No maxillary sinus tenderness.     Left Sinus: No maxillary sinus tenderness or frontal sinus tenderness.     Mouth/Throat:     Lips: Pink. No lesions.     Mouth: Mucous membranes are moist. No oral lesions.     Pharynx: Oropharynx is clear. Uvula midline. Posterior oropharyngeal erythema present. No uvula swelling.     Tonsils: No tonsillar exudate. 1+ on the right. 0 on the left.  Eyes:     General: Lids are normal.        Right eye: No discharge.        Left eye: No discharge.     Extraocular Movements: Extraocular movements intact.     Conjunctiva/sclera: Conjunctivae normal.     Right eye: Right  conjunctiva is not injected.     Left eye: Left conjunctiva is not injected.  Neck:     Trachea: Trachea and phonation normal.  Cardiovascular:     Rate and Rhythm: Normal rate and regular rhythm.     Pulses: Normal pulses.     Heart sounds: Normal heart sounds. No murmur heard.   No friction rub. No gallop.  Pulmonary:     Effort: Pulmonary effort is normal. No accessory muscle usage, prolonged expiration or respiratory distress.     Breath sounds: Normal breath sounds. No stridor, decreased air movement or transmitted upper airway sounds. No decreased breath sounds, wheezing, rhonchi or rales.  Chest:     Chest wall: No tenderness.  Musculoskeletal:        General: Normal range of motion.     Cervical back: Full passive range of motion without pain, normal range of motion and neck supple. Normal range of motion.  Lymphadenopathy:     Cervical: Cervical adenopathy present.     Right cervical: Deep cervical adenopathy present. No superficial or posterior cervical adenopathy.    Left cervical: No deep or posterior cervical adenopathy.  Skin:    General: Skin is warm and dry.     Findings: No erythema or rash.  Neurological:     General: No focal deficit present.     Mental Status: She is alert and oriented to person, place, and time.  Psychiatric:        Mood and Affect: Mood normal.        Behavior: Behavior normal.    Visual Acuity Right Eye Distance:   Left Eye Distance:  Bilateral Distance:    Right Eye Near:   Left Eye Near:    Bilateral Near:     [facility] Couse / Diagnostics / Procedures:    EKG  Radiology No results found.  Procedures Procedures (including critical care time)  UC Diagnoses / Final Clinical Impressions(s)   I have reviewed the triage vital signs and the nursing notes.  Pertinent labs & imaging results that were available during my care of the patient were reviewed by me and considered in my medical decision making (see chart for details).    Final diagnoses:  DIZZINESS  Otalgia of right ear  Sinusitis, unspecified chronicity, unspecified location  Acute suppurative otitis media of right ear   Acute suppurative right otitis media.  Begin cefdinir.  COVID/flu testing performed given presentation with dizziness and sinus pain.  Return precautions advised.  ED Prescriptions     Medication Sig Dispense Auth. Provider   cefdinir (OMNICEF) 300 MG capsule Take 1 capsule (300 mg total) by mouth 2 (two) times daily for 10 days. 20 capsule Lynden Oxford Scales, PA-C      PDMP not reviewed this encounter.  Pending results:  Labs Reviewed  COVID-19, FLU A+B NAA    Medications Ordered in UC: Medications - No data to display  Disposition Upon Discharge:  Condition: stable for discharge home Home: take medications as prescribed; routine discharge instructions as discussed; follow up as advised.  Patient presented with an acute illness with associated systemic symptoms and significant discomfort requiring urgent management. In my opinion, this is a condition that a prudent lay person (someone who possesses an average knowledge of health and medicine) may potentially expect to result in complications if not addressed urgently such as respiratory distress, impairment of bodily function or dysfunction of bodily organs.   Routine symptom specific, illness specific and/or disease specific instructions were discussed with the patient and/or caregiver at length.   As such, the patient has been evaluated and assessed, work-up was performed and treatment was provided in alignment with urgent care protocols and evidence based medicine.  Patient/parent/caregiver has been advised that the patient may require follow up for further testing and treatment if the symptoms continue in spite of treatment, as clinically indicated and appropriate.  If the patient was tested for COVID-19, Influenza and/or RSV, then the patient/parent/guardian was  advised to isolate at home pending the results of his/her diagnostic coronavirus test and potentially longer if theyre positive. I have also advised pt that if his/her COVID-19 test returns positive, it's recommended to self-isolate for at least 10 days after symptoms first appeared AND until fever-free for 24 hours without fever reducer AND other symptoms have improved or resolved. Discussed self-isolation recommendations as well as instructions for household member/close contacts as per the [facility] and [place] DHHS, and also gave patient the [place] packet with this information.  Patient/parent/caregiver has been advised to return to the [facility] or PCP in 3-5 days if no better; to PCP or the Emergency Department if new signs and symptoms develop, or if the current signs or symptoms continue to change or worsen for further workup, evaluation and treatment as clinically indicated and appropriate  The patient will follow up with their current PCP if and as advised. If the patient does not currently have a PCP we will assist them in obtaining one.   The patient may need specialty follow up if the symptoms continue, in spite of conservative treatment and management, for further workup, evaluation, consultation and treatment as clinically  indicated and appropriate.  Patient/parent/caregiver verbalized understanding and agreement of plan as discussed.  All questions were addressed during visit.  Please see discharge instructions below for further details of plan.  Discharge Instructions:   Discharge Instructions      Begin cefdinir 1 capsule twice daily for the next 10 days for acute suppurative right otitis media.  If you have not had complete relief of your symptoms after finishing full 10 days of cefdinir, please follow-up with your primary care provider for repeat evaluation.  You will be notified of the results of your COVID test via your MyChart, if there is a positive result, you will be contacted by  phone.      This office note has been dictated using Museum/gallery curator.  Unfortunately, and despite my best efforts, this method of dictation can sometimes lead to occasional typographical or grammatical errors.  I apologize in advance if this occurs.     Lynden Oxford Scales, PA-C 03/03/21 1349

## 2021-03-03 NOTE — Discharge Instructions (Signed)
Begin cefdinir 1 capsule twice daily for the next 10 days for acute suppurative right otitis media.  If you have not had complete relief of your symptoms after finishing full 10 days of cefdinir, please follow-up with your primary care provider for repeat evaluation.  You will be notified of the results of your COVID test via your MyChart, if there is a positive result, you will be contacted by phone.

## 2021-03-03 NOTE — ED Triage Notes (Signed)
Pt reports waking up this morning with dizziness. She states having sinus discomfort and ear pain (right sided).

## 2021-03-04 ENCOUNTER — Ambulatory Visit: Payer: HMO | Admitting: Internal Medicine

## 2021-03-04 ENCOUNTER — Ambulatory Visit: Payer: HMO | Admitting: Family Medicine

## 2021-03-04 LAB — COVID-19, FLU A+B NAA
Influenza A, NAA: NOT DETECTED
Influenza B, NAA: NOT DETECTED
SARS-CoV-2, NAA: NOT DETECTED

## 2021-03-17 ENCOUNTER — Other Ambulatory Visit (INDEPENDENT_AMBULATORY_CARE_PROVIDER_SITE_OTHER): Payer: Medicare PPO

## 2021-03-17 ENCOUNTER — Other Ambulatory Visit: Payer: Self-pay

## 2021-03-17 DIAGNOSIS — E785 Hyperlipidemia, unspecified: Secondary | ICD-10-CM

## 2021-03-17 DIAGNOSIS — Z79899 Other long term (current) drug therapy: Secondary | ICD-10-CM

## 2021-03-17 LAB — HEPATIC FUNCTION PANEL
ALT (GPT): 14 U/L (ref 7–33)
AST (GOT): 19 U/L (ref 9–38)
Albumin: 4.2 g/dL (ref 3.5–5.2)
Alkaline Phosphatase (Total): 49 U/L (ref 38–172)
Bilirubin (Direct): 0.1 mg/dL (ref 0.0–0.3)
Bilirubin (Total): 0.6 mg/dL (ref 0.2–1.3)
Protein (Total): 7 g/dL (ref 6.0–8.2)

## 2021-03-17 LAB — LIPID PANEL
Cholesterol/HDL Ratio: 2.4
HDL Cholesterol: 77 mg/dL (ref >39)
LDL Cholesterol, NIH Equation: 93 mg/dL (ref <130)
Non-HDL Cholesterol: 105 mg/dL (ref 0–159)
Total Cholesterol: 182 mg/dL (ref <200)
Triglyceride: 66 mg/dL (ref <150)

## 2021-03-18 NOTE — Result Encounter Note (Signed)
Reviewed results, all within normal limits.

## 2021-03-25 ENCOUNTER — Other Ambulatory Visit: Payer: Self-pay | Admitting: Internal Medicine

## 2021-03-25 DIAGNOSIS — E119 Type 2 diabetes mellitus without complications: Secondary | ICD-10-CM | POA: Diagnosis not present

## 2021-03-25 DIAGNOSIS — Z794 Long term (current) use of insulin: Secondary | ICD-10-CM | POA: Diagnosis not present

## 2021-03-25 DIAGNOSIS — Z7984 Long term (current) use of oral hypoglycemic drugs: Secondary | ICD-10-CM | POA: Diagnosis not present

## 2021-03-28 ENCOUNTER — Other Ambulatory Visit (INDEPENDENT_AMBULATORY_CARE_PROVIDER_SITE_OTHER): Payer: HMO

## 2021-03-28 DIAGNOSIS — E119 Type 2 diabetes mellitus without complications: Secondary | ICD-10-CM | POA: Diagnosis not present

## 2021-03-28 DIAGNOSIS — Z794 Long term (current) use of insulin: Secondary | ICD-10-CM

## 2021-03-28 DIAGNOSIS — E785 Hyperlipidemia, unspecified: Secondary | ICD-10-CM | POA: Diagnosis not present

## 2021-03-28 LAB — URINALYSIS, ROUTINE W REFLEX MICROSCOPIC
Bilirubin Urine: NEGATIVE
Ketones, ur: NEGATIVE
Nitrite: NEGATIVE
Specific Gravity, Urine: 1.02 (ref 1.000–1.030)
Total Protein, Urine: NEGATIVE
Urine Glucose: NEGATIVE
Urobilinogen, UA: 0.2 (ref 0.0–1.0)
pH: 6 (ref 5.0–8.0)

## 2021-03-28 LAB — CBC WITH DIFFERENTIAL/PLATELET
Basophils Absolute: 0.1 10*3/uL (ref 0.0–0.1)
Basophils Relative: 2.1 % (ref 0.0–3.0)
Eosinophils Absolute: 0.3 10*3/uL (ref 0.0–0.7)
Eosinophils Relative: 5.3 % — ABNORMAL HIGH (ref 0.0–5.0)
HCT: 38.4 % (ref 36.0–46.0)
Hemoglobin: 12.5 g/dL (ref 12.0–15.0)
Lymphocytes Relative: 35.4 % (ref 12.0–46.0)
Lymphs Abs: 2.2 10*3/uL (ref 0.7–4.0)
MCHC: 32.5 g/dL (ref 30.0–36.0)
MCV: 84.7 fl (ref 78.0–100.0)
Monocytes Absolute: 0.4 10*3/uL (ref 0.1–1.0)
Monocytes Relative: 6.7 % (ref 3.0–12.0)
Neutro Abs: 3.2 10*3/uL (ref 1.4–7.7)
Neutrophils Relative %: 50.5 % (ref 43.0–77.0)
Platelets: 285 10*3/uL (ref 150.0–400.0)
RBC: 4.53 Mil/uL (ref 3.87–5.11)
RDW: 13.5 % (ref 11.5–15.5)
WBC: 6.3 10*3/uL (ref 4.0–10.5)

## 2021-03-28 LAB — COMPREHENSIVE METABOLIC PANEL
ALT: 16 U/L (ref 0–35)
AST: 19 U/L (ref 0–37)
Albumin: 4.1 g/dL (ref 3.5–5.2)
Alkaline Phosphatase: 67 U/L (ref 39–117)
BUN: 24 mg/dL — ABNORMAL HIGH (ref 6–23)
CO2: 32 mEq/L (ref 19–32)
Calcium: 9.4 mg/dL (ref 8.4–10.5)
Chloride: 104 mEq/L (ref 96–112)
Creatinine, Ser: 1.01 mg/dL (ref 0.40–1.20)
GFR: 56.22 mL/min — ABNORMAL LOW (ref >60.00)
Glucose, Bld: 119 mg/dL — ABNORMAL HIGH (ref 70–99)
Potassium: 4.8 mEq/L (ref 3.5–5.1)
Sodium: 141 mEq/L (ref 135–145)
Total Bilirubin: 0.3 mg/dL (ref 0.2–1.2)
Total Protein: 7.3 g/dL (ref 6.0–8.3)

## 2021-03-28 LAB — MICROALBUMIN / CREATININE URINE RATIO
Creatinine,U: 125.4 mg/dL
Microalb Creat Ratio: 1.5 mg/g (ref 0.0–30.0)
Microalb, Ur: 1.9 mg/dL (ref 0.0–1.9)

## 2021-03-28 LAB — HEMOGLOBIN A1C: Hgb A1c MFr Bld: 6.9 % — ABNORMAL HIGH (ref 4.6–6.5)

## 2021-03-28 LAB — TSH: TSH: 2.83 u[IU]/mL (ref 0.35–5.50)

## 2021-03-30 ENCOUNTER — Ambulatory Visit (INDEPENDENT_AMBULATORY_CARE_PROVIDER_SITE_OTHER): Payer: HMO | Admitting: Internal Medicine

## 2021-03-30 ENCOUNTER — Other Ambulatory Visit: Payer: Self-pay

## 2021-03-30 ENCOUNTER — Encounter: Payer: Self-pay | Admitting: Internal Medicine

## 2021-03-30 ENCOUNTER — Ambulatory Visit: Payer: HMO | Admitting: Internal Medicine

## 2021-03-30 DIAGNOSIS — E119 Type 2 diabetes mellitus without complications: Secondary | ICD-10-CM

## 2021-03-30 DIAGNOSIS — Z794 Long term (current) use of insulin: Secondary | ICD-10-CM | POA: Diagnosis not present

## 2021-03-30 DIAGNOSIS — I10 Essential (primary) hypertension: Secondary | ICD-10-CM | POA: Diagnosis not present

## 2021-03-30 DIAGNOSIS — F4323 Adjustment disorder with mixed anxiety and depressed mood: Secondary | ICD-10-CM

## 2021-03-30 DIAGNOSIS — F329 Major depressive disorder, single episode, unspecified: Secondary | ICD-10-CM

## 2021-03-30 DIAGNOSIS — E785 Hyperlipidemia, unspecified: Secondary | ICD-10-CM | POA: Diagnosis not present

## 2021-03-30 MED ORDER — ALPRAZOLAM 1 MG PO TABS: 1.0000 mg | ORAL_TABLET | Freq: Every evening | ORAL | 1 refills | Status: DC | PRN

## 2021-03-30 NOTE — Progress Notes (Signed)
Subjective:  Patient ID: Catherine Mora, female    DOB: April 06, 1950  Age: 71 y.o. MRN: [id number]  CC: No chief complaint on file.   HPI Catherine Mora presents for DM, HTN, anxiety    Outpatient Medications Prior to Visit  Medication Sig Dispense Refill   BIOTIN FORTE PO Take 500 mg by mouth daily.     bisacodyl (DULCOLAX) 5 MG EC tablet Take 1 tablet (5 mg total) by mouth daily as needed for moderate constipation. 30 tablet 3   DULoxetine (CYMBALTA) 30 MG capsule Take 1 capsule (30 mg total) by mouth daily. 90 capsule 1   glimepiride (AMARYL) 1 MG tablet Take 1 mg by mouth daily with breakfast.     ibuprofen (ADVIL,MOTRIN) 200 MG tablet Take 400 mg by mouth every 6 (six) hours as needed for headache.     insulin detemir (LEVEMIR) 100 UNIT/ML FlexPen Inject 16 Units into the skin 2 (two) times daily.     Insulin Pen Needle 31G X 5 MM MISC Use with Levemir pen 100 each 0   lisinopril (ZESTRIL) 10 MG tablet Take 1 tablet by mouth daily.     lovastatin (MEVACOR) 20 MG tablet TAKE 1 TABLET DAILY 90 tablet 3   NOVOLOG FLEXPEN 100 UNIT/ML FlexPen TAKE AS DIRECTED PER SLIDING SCALE. MAX DAILY DOSE: 50 UNITS/DAY.  3   omeprazole (PRILOSEC) 20 MG capsule Take 1 capsule (20 mg total) by mouth daily. 90 capsule 3   ONETOUCH VERIO test strip 1 each by Other route 2 (two) times daily.      Polyethyl Glycol-Propyl Glycol (SYSTANE FREE OP) Apply to eye.     ALPRAZolam (XANAX) 1 MG tablet Take 1 tablet (1 mg total) by mouth at bedtime as needed for anxiety. 90 tablet 1   clonazePAM (KLONOPIN) 0.25 MG disintegrating tablet Take 1 tablet (0.25 mg total) by mouth 2 (two) times daily as needed. 60 tablet 1   DULoxetine (CYMBALTA) 20 MG capsule TAKE 1 CAPSULE BY MOUTH EVERY DAY 90 capsule 1   lisinopril (ZESTRIL) 5 MG tablet Take 5 mg by mouth daily.     pravastatin (PRAVACHOL) 20 MG tablet Take 1 tablet (20 mg total) by mouth daily. 30 tablet 11   No facility-administered  medications prior to visit.    ROS: Review of Systems  Constitutional:  Negative for activity change, appetite change, chills, fatigue and unexpected weight change.  HENT:  Negative for congestion, mouth sores and sinus pressure.   Eyes:  Negative for visual disturbance.  Respiratory:  Negative for cough and chest tightness.   Gastrointestinal:  Negative for abdominal pain and nausea.  Genitourinary:  Negative for difficulty urinating, frequency and vaginal pain.  Musculoskeletal:  Negative for back pain and gait problem.  Skin:  Negative for pallor and rash.  Neurological:  Negative for dizziness, tremors, weakness, numbness and headaches.  Psychiatric/Behavioral:  Negative for confusion, sleep disturbance and suicidal ideas. The patient is nervous/anxious.    Objective:  BP (!) 146/80 (BP Location: Left Arm, Patient Position: Sitting, Cuff Size: Normal)    Pulse 76    Ht 5\' 10"  (1.778 m)    Wt 191 lb 12.8 oz (87 kg)    SpO2 99%    BMI 27.52 kg/m   BP Readings from Last 3 Encounters:  03/30/21 (!) 146/80  03/03/21 135/87  12/28/20 (!) 141/92    Wt Readings from Last 3 Encounters:  03/30/21 191 lb 12.8 oz (87 kg)  12/28/20 187 lb 12.8  oz (85.2 kg)  09/27/20 186 lb (84.4 kg)    Physical Exam Constitutional:      General: She is not in acute distress.    Appearance: She is well-developed.  HENT:     Head: Normocephalic.     Right Ear: External ear normal.     Left Ear: External ear normal.     Nose: Nose normal.  Eyes:     General:        Right eye: No discharge.        Left eye: No discharge.     Conjunctiva/sclera: Conjunctivae normal.     Pupils: Pupils are equal, round, and reactive to light.  Neck:     Thyroid: No thyromegaly.     Vascular: No JVD.     Trachea: No tracheal deviation.  Cardiovascular:     Rate and Rhythm: Normal rate and regular rhythm.     Heart sounds: Normal heart sounds.  Pulmonary:     Effort: No respiratory distress.     Breath sounds:  No stridor. No wheezing.  Abdominal:     General: Bowel sounds are normal. There is no distension.     Palpations: Abdomen is soft. There is no mass.     Tenderness: There is no abdominal tenderness. There is no guarding or rebound.  Musculoskeletal:        General: No tenderness.     Cervical back: Normal range of motion and neck supple. No rigidity.  Lymphadenopathy:     Cervical: No cervical adenopathy.  Skin:    Findings: No erythema or rash.  Neurological:     Cranial Nerves: No cranial nerve deficit.     Motor: No abnormal muscle tone.     Coordination: Coordination normal.     Deep Tendon Reflexes: Reflexes normal.  Psychiatric:        Behavior: Behavior normal.        Thought Content: Thought content normal.        Judgment: Judgment normal.    Lab Results  Component Value Date   WBC 6.3 03/28/2021   HGB 12.5 03/28/2021   HCT 38.4 03/28/2021   PLT 285.0 03/28/2021   GLUCOSE 119 (H) 03/28/2021   CHOL 266 (H) 03/04/2019   TRIG 149.0 03/04/2019   HDL 49.80 03/04/2019   LDLDIRECT 219.0 08/14/2014   LDLCALC 186 (H) 03/04/2019   ALT 16 03/28/2021   AST 19 03/28/2021   NA 141 03/28/2021   K 4.8 03/28/2021   CL 104 03/28/2021   CREATININE 1.01 03/28/2021   BUN 24 (H) 03/28/2021   CO2 32 03/28/2021   TSH 2.83 03/28/2021   HGBA1C 6.9 (H) 03/28/2021   MICROALBUR 1.9 03/28/2021    No results found.  Assessment & Plan:   Problem List Items Addressed This Visit     Adjustment disorder with mixed anxiety and depressed mood     Potential benefits of a long term benzodiazepines  use as well as potential risks  and complications were explained to the patient and were aknowledged. Xanax prn, Lexapro      Depression    Cont on Cymbalta 30 mg/d      Hypertensive disorder    Cont on Lisinopril Check BP at home       Type 2 diabetes mellitus without complication (HCC)    Cont on Levimir and Novolog prn, Amaryl         No orders of the defined types were  placed in this encounter.  Follow-up: No follow-ups on file.  Walker Kehr, MD

## 2021-03-30 NOTE — Assessment & Plan Note (Signed)
Cont on Lisinopril Check BP at home

## 2021-03-30 NOTE — Assessment & Plan Note (Addendum)
Potential benefits of a long term benzodiazepines  use as well as potential risks  and complications were explained to the patient and were aknowledged. Cont on Xanax prn, Cymbalta

## 2021-03-30 NOTE — Assessment & Plan Note (Signed)
Coronary calcium CT score is 117.  On lovastatin - better. Less achy

## 2021-03-30 NOTE — Assessment & Plan Note (Signed)
Cont on Cymbalta 30 mg/d

## 2021-03-30 NOTE — Assessment & Plan Note (Signed)
Cont on Levimir and Novolog prn, Amaryl 

## 2021-04-12 ENCOUNTER — Encounter (HOSPITAL_BASED_OUTPATIENT_CLINIC_OR_DEPARTMENT_OTHER): Payer: Self-pay

## 2021-04-14 ENCOUNTER — Ambulatory Visit
Admission: RE | Admit: 2021-04-14 | Discharge: 2021-04-14 | Disposition: A | Discharge status: Home / Self Care | Payer: Medicare PPO | Attending: Diagnostic Radiology | Admitting: Diagnostic Radiology

## 2021-04-14 ENCOUNTER — Encounter (HOSPITAL_BASED_OUTPATIENT_CLINIC_OR_DEPARTMENT_OTHER): Payer: Self-pay

## 2021-04-14 DIAGNOSIS — Z1231 Encounter for screening mammogram for malignant neoplasm of breast: Secondary | ICD-10-CM | POA: Insufficient documentation

## 2021-04-14 HISTORY — DX: Personal history of irradiation: Z92.3

## 2021-05-09 ENCOUNTER — Ambulatory Visit: Payer: HMO

## 2021-05-19 ENCOUNTER — Ambulatory Visit (INDEPENDENT_AMBULATORY_CARE_PROVIDER_SITE_OTHER): Payer: Medicare PPO | Admitting: Internal Medicine

## 2021-05-19 ENCOUNTER — Other Ambulatory Visit: Payer: Self-pay

## 2021-05-19 VITALS — BP 100/65 | HR 82 | Temp 97.4°F | Wt 110.0 lb

## 2021-05-19 DIAGNOSIS — Z0000 Encounter for general adult medical examination without abnormal findings: Secondary | ICD-10-CM

## 2021-05-19 DIAGNOSIS — E782 Mixed hyperlipidemia: Secondary | ICD-10-CM

## 2021-05-19 DIAGNOSIS — J4520 Mild intermittent asthma, uncomplicated: Secondary | ICD-10-CM

## 2021-05-19 DIAGNOSIS — R350 Frequency of micturition: Secondary | ICD-10-CM

## 2021-05-19 DIAGNOSIS — Z170 Estrogen receptor positive status [ER+]: Secondary | ICD-10-CM

## 2021-05-19 DIAGNOSIS — Z1329 Encounter for screening for other suspected endocrine disorder: Secondary | ICD-10-CM

## 2021-05-19 DIAGNOSIS — C50911 Malignant neoplasm of unspecified site of right female breast: Secondary | ICD-10-CM

## 2021-05-19 DIAGNOSIS — F331 Major depressive disorder, recurrent, moderate: Secondary | ICD-10-CM

## 2021-05-19 LAB — COMPREHENSIVE METABOLIC PANEL
ALT (GPT): 10 U/L (ref 7–33)
AST (GOT): 16 U/L (ref 9–38)
Albumin: 4.2 g/dL (ref 3.5–5.2)
Alkaline Phosphatase (Total): 49 U/L (ref 38–172)
Anion Gap: 6 (ref 4–12)
Bilirubin (Total): 0.5 mg/dL (ref 0.2–1.3)
Calcium: 9.6 mg/dL (ref 8.9–10.2)
Carbon Dioxide, Total: 31 meq/L (ref 22–32)
Chloride: 103 meq/L (ref 98–108)
Creatinine: 0.64 mg/dL (ref 0.38–1.02)
Glucose: 86 mg/dL (ref 62–125)
Potassium: 4.4 meq/L (ref 3.6–5.2)
Protein (Total): 6.7 g/dL (ref 6.0–8.2)
Sodium: 140 meq/L (ref 135–145)
Urea Nitrogen: 20 mg/dL (ref 8–21)
eGFR by CKD-EPI 2021: >60 mL/min/{1.73_m2} (ref >59)

## 2021-05-19 LAB — CBC, DIFF
% Basophils: 1 %
% Eosinophils: 8 %
% Immature Granulocytes: 0 %
% Lymphocytes: 19 %
% Monocytes: 9 %
% Neutrophils: 63 %
% Nucleated RBC: 0 %
Absolute Eosinophil Count: 0.56 10*3/uL — ABNORMAL HIGH (ref 0.00–0.50)
Absolute Lymphocyte Count: 1.43 10*3/uL (ref 1.00–4.80)
Basophils: 0.05 10*3/uL (ref 0.00–0.20)
Hematocrit: 42 % (ref 36.0–45.0)
Hemoglobin: 13.6 g/dL (ref 11.5–15.5)
Immature Granulocytes: 0.03 10*3/uL (ref 0.00–0.05)
MCH: 32 pg (ref 27.3–33.6)
MCHC: 32.2 g/dL (ref 32.2–36.5)
MCV: 99 fL — ABNORMAL HIGH (ref 81–98)
Monocytes: 0.64 10*3/uL (ref 0.00–0.80)
Neutrophils: 4.65 10*3/uL (ref 1.80–7.00)
Nucleated RBC: 0 10*3/uL
Platelet Count: 209 10*3/uL (ref 150–400)
RBC: 4.25 10*6/uL (ref 3.80–5.00)
RDW-CV: 12.2 % (ref 11.0–14.5)
WBC: 7.36 10*3/uL (ref 4.3–10.0)

## 2021-05-19 LAB — U/A AUTO DIPSTICK ONLY, ONSITE
Bilirubin, Urine: NEGATIVE
Glucose, Urine: NEGATIVE mg/dL
Ketones, URN: NEGATIVE mg/dL
Leukocytes: NEGATIVE
Nitrite, URN: NEGATIVE
Occult Blood, URN: NEGATIVE
Protein: NEGATIVE mg/dL
Specific Gravity, Urine: 1.015 (ref 1.005–1.030)
Urobilinogen, URN: 0.2 E.U./dL (ref 0.2–1.0)
pH, URN: 7.5 (ref 5.0–8.0)

## 2021-05-19 LAB — TSH WITH REFLEXIVE FREE T4: TSH with Reflexive Free T4: 2.446 u[IU]/mL (ref 0.400–5.000)

## 2021-05-19 MED ORDER — SIMVASTATIN 10 MG OR TABS
5.0000 mg | ORAL_TABLET | Freq: Every day | ORAL | Status: DC
Start: 2021-05-19 — End: 2021-09-01

## 2021-05-19 MED ORDER — ALBUTEROL SULFATE HFA 108 (90 BASE) MCG/ACT IN AERS
2.0000 | INHALATION_SPRAY | Freq: Three times a day (TID) | RESPIRATORY_TRACT | 0 refills | Status: AC | PRN
Start: 2021-05-19 — End: ?

## 2021-05-19 NOTE — Patient Instructions (Addendum)
POLST   POA   Living Will         Based on today's evaluation, I recommend the following ways to improve your health or functioning.  1- Weight Control   2- Physical activity   3- Social activity   4- mental activity     You have the following risk factors and/or medical conditions for which there are recommended ways (included in this list) to help you stay as healthy as possible:  Hyperlipidemia

## 2021-05-19 NOTE — Progress Notes (Signed)
ANNUAL WELLNESS VISIT  Paula Bowman is a 71 year old female who presents for a subsequent Annual Wellness Visit.    Frequency of urination , no other symptoms  The patient denied any urgency or nocturia, also denied any burning urination or blood in urine, he denied any flank or lower abdominal pain.    No nausea or vomiting     History of mild asthma for which she uses Flovent as needed , no related symptoms today and she uses very infrequently , her RX likely expired     History of breast cancer , 13 years ago , she gets her mammogram on regular basis     Is this a Telemedicine or a Phone visit?  No       INFORMATION GATHERING:    The following areas were confirmed with patient/caregiver and/or updated in Epic at this visit:   Past Medical, Surgical, Family, and Social History  Current medications and supplements  Allergies  All of the above components have been reviewed and updated: yes    Patient is taking care of ADL no need for help   Hearing is Good   Fall risk is low   Diet on regular basis   Physically active daily   patient reads and watch diet and exercise on regular basis     Opioid Use: Patient not taking opioids   I have reviewed the patient's risk factors for other substance use disorders.  If appropriate, I've referred the patient for treatment.    Please see today's HRA for review of patient's functional ability and level of safety. Concerns are addressed below in the Personalized Prevention Plan section.    For list of current providers and suppliers, see Care Team Section, EHR encounters, and/or HRA questionnaire for [facility] Medicine providers involved in care    Other providers and suppliers outside [place]:   No outside providers identified      Depression screening:   PHQ-2: 3  PHQ-9 (if done): 4     EXAM:  BP 100/65   Pulse 82   Temp 36.3 C (Temporal)   Wt 49.9 kg (110 lb)   SpO2 93%   BMI 16.73 kg/m    Physical Exam  Vitals reviewed.   Constitutional:       Appearance:  Normal appearance.   Eyes:      General: No scleral icterus.        Right eye: No discharge.         Left eye: No discharge.   Skin:     General: Skin is warm and dry.   Neurological:      General: No focal deficit present.      Mental Status: She is alert and oriented to person, place, and time.   Psychiatric:         Mood and Affect: Mood normal.         Behavior: Behavior normal.          GAIT:   Normal, stable, independent    COGNITION:     Intact       ADVANCE CARE PLANNING (ACP)       Advance care planning:  Offered, patient accepted.  Time spent on ACP: 1-3    Explained & discussed advance directives at this visit    ASSESSMENT:   [name] was seen for her Annual Wellness Visit, including identification of risk factors & conditions that may affect her health and function in  the future.   1. Encounter for Medicare annual wellness exam      2. Frequency of urination  Check UA and treat as needed ,   - POC Urine Dipstick, Automated    3. Moderate episode of recurrent major depressive disorder ([place])  Resolved   No need for medications     4. Malignant neoplasm of right breast in female, estrogen receptor positive, unspecified site of breast ([place])      5. Mixed hyperlipidemia  Reviewed last lipid profile was within normal limits   Continue same   - simvastatin 10 MG tablet; Take 0.5 tablets (5 mg) by mouth daily.  - CBC with Diff; Future  - Comprehensive Metabolic Panel; Future  - Comprehensive Metabolic Panel  - CBC with Diff    6. Mild intermittent extrinsic asthma without complication  Stop Flovent   Start Albuterol , , discussed about the medicine with the patient mechanism of action, possible side effect and how to take the medicine also advised to report any side effects .   - albuterol HFA (ProAir HFA) 108 (90 Base) MCG/ACT inhaler; Inhale 2 puffs by mouth 3 times a day as needed for shortness of breath/wheezing.  Dispense: 8.5 g; Refill: 0    7. Screening for thyroid disorder  - TSH  w/Reflexive Free T4; Future  - TSH w/Reflexive Free T4   Actions at this visit:    Established or updated a written schedule of screening and prevention measures recommended and appropriate for [name] for the next 5-10 years  Established or updated a list of her risk factors and conditions for which lifestyle or medical interventions are recommended or underway, including mental health risks and conditions, and including risks/benefits of treatment  Furnished personalized health advice and, as appropriate, referrals to health education or preventive counseling services or programs (such as fall prevention, tobacco cessation, physical activity, nutrition, cognition, weight loss)    All of the above components have been reviewed and updated. yes    Personalized Prevention Plan:      Based on today's evaluation, I recommend the following ways to improve your health or functioning.  1- Weight Control   2- Physical activity   3- Social activity   4- mental activity     You have the following risk factors and/or medical conditions for which there are recommended ways (included in this list) to help you stay as healthy as possible:   Hyperlipidemia     Here are screening & prevention measures recommended for you:  Health Maintenance   Topic Date Due   . Zoster Vaccine (2 of 3) 07/08/2012   . Influenza Vaccine (1) 11/20/2020   . Depression Monitoring (PHQ-9)  05/20/2022   . Medicare Annual Wellness Visit  05/20/2022   . Breast Cancer Screening  04/15/2023   . DTaP, Tdap, and Td Vaccines (2 - Td or Tdap) 11/28/2023   . Colorectal Cancer Screening  09/14/2024   . Lipid Disorders Screening  03/17/2026   . Osteoporosis Screening  Completed   . Pneumococcal Vaccine: 65+ years  Completed   . COVID-19 Vaccine  Completed   . Hepatitis C Screening  Addressed   . Hepatitis A Vaccine  Aged Out   . Hepatitis B Vaccine  Aged Out       Please plan to have a Subsequent Annual Wellness Visit in 1 year.

## 2021-05-20 ENCOUNTER — Encounter (INDEPENDENT_AMBULATORY_CARE_PROVIDER_SITE_OTHER): Payer: Self-pay | Admitting: Internal Medicine

## 2021-06-03 ENCOUNTER — Ambulatory Visit: Payer: HMO

## 2021-06-23 ENCOUNTER — Other Ambulatory Visit: Payer: Self-pay | Admitting: Internal Medicine

## 2021-06-27 ENCOUNTER — Emergency Department (HOSPITAL_BASED_OUTPATIENT_CLINIC_OR_DEPARTMENT_OTHER)
Admission: EM | Admit: 2021-06-27 | Discharge: 2021-06-28 | Disposition: A | Discharge status: Home / Self Care | Payer: HMO | Attending: Emergency Medicine | Admitting: Emergency Medicine

## 2021-06-27 ENCOUNTER — Other Ambulatory Visit: Payer: Self-pay

## 2021-06-27 ENCOUNTER — Other Ambulatory Visit: Payer: Self-pay | Admitting: Gastroenterology

## 2021-06-27 ENCOUNTER — Encounter (HOSPITAL_BASED_OUTPATIENT_CLINIC_OR_DEPARTMENT_OTHER): Payer: Self-pay | Admitting: Urology

## 2021-06-27 DIAGNOSIS — Z79899 Other long term (current) drug therapy: Secondary | ICD-10-CM | POA: Insufficient documentation

## 2021-06-27 DIAGNOSIS — Z794 Long term (current) use of insulin: Secondary | ICD-10-CM | POA: Insufficient documentation

## 2021-06-27 DIAGNOSIS — R42 Dizziness and giddiness: Secondary | ICD-10-CM | POA: Diagnosis not present

## 2021-06-27 DIAGNOSIS — R519 Headache, unspecified: Secondary | ICD-10-CM | POA: Diagnosis not present

## 2021-06-27 DIAGNOSIS — H9319 Tinnitus, unspecified ear: Secondary | ICD-10-CM | POA: Diagnosis not present

## 2021-06-27 DIAGNOSIS — E119 Type 2 diabetes mellitus without complications: Secondary | ICD-10-CM | POA: Diagnosis not present

## 2021-06-27 DIAGNOSIS — R110 Nausea: Secondary | ICD-10-CM | POA: Diagnosis not present

## 2021-06-27 DIAGNOSIS — Z7984 Long term (current) use of oral hypoglycemic drugs: Secondary | ICD-10-CM | POA: Insufficient documentation

## 2021-06-27 DIAGNOSIS — I10 Essential (primary) hypertension: Secondary | ICD-10-CM | POA: Diagnosis not present

## 2021-06-27 DIAGNOSIS — F19939 Other psychoactive substance use, unspecified with withdrawal, unspecified: Secondary | ICD-10-CM | POA: Insufficient documentation

## 2021-06-27 NOTE — ED Notes (Signed)
Pt on monitor 

## 2021-06-27 NOTE — ED Triage Notes (Signed)
Pt states blood pressure up and is dizzy ?Stopped cymbalta without tapering, has not had any in 2 days, decided to stop on own due to having crazy dreams, been taking it for 4 months ?Denies any chest pain ?States BP 180/97 at home ?

## 2021-06-28 ENCOUNTER — Other Ambulatory Visit: Payer: Self-pay

## 2021-06-28 DIAGNOSIS — E119 Type 2 diabetes mellitus without complications: Secondary | ICD-10-CM

## 2021-06-28 DIAGNOSIS — I10 Essential (primary) hypertension: Secondary | ICD-10-CM

## 2021-06-28 NOTE — ED Provider Notes (Signed)
? ?[facility] ?Provider Note: Georgena Spurling, MD, FACEP ? ?CSN: [id number] ?MRN: [id number] ?ARRIVAL: 06/27/21 at 2250 ?ROOM: MH11/MH11 ? ? ?CHIEF COMPLAINT  ?Dizziness ? ? ?HISTORY OF PRESENT ILLNESS  ?06/28/21 12:47 AM ?Catherine Mora is a 71 y.o. female who abruptly stopped taking Cymbalta 3 days ago due to dizziness which she describes as vertigo-like.  She had been taking it for 4 months.  Since discontinuing the Cymbalta she has noticed her blood pressure has higher than usual (167/98 at one point).  She has had one bad dream since stopping the Cymbalta.  She has also had intermittent headache, lightheadedness, mild ear ringing, nausea, fatigue and increased anxiety.  She has not had shocklike sensations, vomiting or muscle cramps.  ? ? ?Past Medical History:  ?Diagnosis Date  ? Allergy   ? Anxiety   ? Cataract   ? starting, no surgery yet  ? Cholelithiasis 2011  ? Depression   ? Diabetes mellitus without complication ([place])   ? DKA (diabetic ketoacidoses)   ? several years ago and in hospital x 1 week   ? GERD (gastroesophageal reflux disease)   ? Glucose intolerance (impaired glucose tolerance)   ? HTN (hypertension)   ? Hyperlipidemia   ? IBS (irritable bowel syndrome)   ? PONV (postoperative nausea and vomiting)   ? ? ?Past Surgical History:  ?Procedure Laterality Date  ? ABDOMINAL HYSTERECTOMY    ? CHOLECYSTECTOMY N/A 07/03/2012  ? Procedure: LAPAROSCOPIC CHOLECYSTECTOMY WITH INTRAOPERATIVE CHOLANGIOGRAM;  Surgeon: Zenovia Jarred, MD;  Location: [place];  Service: General;  Laterality: N/A;  ? COLONOSCOPY    ? cyst removed    ? x3  ? OOPHORECTOMY    ? POLYPECTOMY    ? ? ?Family History  ?Problem Relation Age of Onset  ? Diabetes Mother   ? Diabetes Sister   ? Diabetes Brother   ? Diabetes Other   ? Stomach cancer Paternal Uncle   ? Heart disease Neg Hx   ? Colon cancer Neg Hx   ? Esophageal cancer Neg Hx   ? Rectal cancer Neg Hx   ? Colon polyps Neg Hx   ? ? ?Social History  ? ?Tobacco Use  ?  Smoking status: Never  ? Smokeless tobacco: Never  ?Vaping Use  ? Vaping Use: Never used  ?Substance Use Topics  ? Alcohol use: Yes  ?  Comment: very little  ? Drug use: No  ? ? ?Prior to Admission medications   ?Medication Sig Start Date End Date Taking? Authorizing Provider  ?ALPRAZolam (XANAX) 1 MG tablet Take 1 tablet (1 mg total) by mouth at bedtime as needed for anxiety. 03/30/21   Plotnikov, Evie Lacks, MD  ?BIOTIN FORTE PO Take 500 mg by mouth daily.    [provider]  ?bisacodyl (DULCOLAX) 5 MG EC tablet Take 1 tablet (5 mg total) by mouth daily as needed for moderate constipation. 10/03/16   Plotnikov, Evie Lacks, MD  ?DULoxetine (CYMBALTA) 30 MG capsule TAKE 1 CAPSULE BY MOUTH EVERY DAY 06/23/21   Plotnikov, Evie Lacks, MD  ?glimepiride (AMARYL) 1 MG tablet Take 1 mg by mouth daily with breakfast.    [provider]  ?ibuprofen (ADVIL,MOTRIN) 200 MG tablet Take 400 mg by mouth every 6 (six) hours as needed for headache.    [provider]  ?insulin detemir (LEVEMIR) 100 UNIT/ML FlexPen Inject 16 Units into the skin 2 (two) times daily.    [provider]  ?Insulin Pen Needle  31G X 5 MM MISC Use with Levemir pen 08/22/14   Cristal Ford, DO  ?lisinopril (ZESTRIL) 10 MG tablet Take 1 tablet by mouth daily. 02/15/21   [provider]  ?lovastatin (MEVACOR) 20 MG tablet TAKE 1 TABLET BY MOUTH EVERY DAY 06/23/21   Plotnikov, Evie Lacks, MD  ?NOVOLOG FLEXPEN 100 UNIT/ML FlexPen TAKE AS DIRECTED PER SLIDING SCALE. MAX DAILY DOSE: 50 UNITS/DAY. 05/10/17   [provider]  ?omeprazole (PRILOSEC) 20 MG capsule Take 1 capsule (20 mg total) by mouth daily. Please schedule an yearly office visit for further refills. Thank you 06/27/21   Armbruster, [name], MD  ?Roma Schanz test strip 1 each by Other route 2 (two) times daily.  08/14/14   [provider]  ?Polyethyl Glycol-Propyl Glycol (SYSTANE FREE OP) Apply to eye.    [provider]  ?Pitavastatin  Calcium 1 MG TABS Take 1 tablet (1 mg total) by mouth daily. 03/24/19 03/26/19  Plotnikov, Evie Lacks, MD  ? ? ?Allergies ?Metformin, Ciprofloxacin, Crestor [rosuvastatin calcium], Hydrochlorothiazide, Invokana [canagliflozin], Paroxetine, Penicillins, and Simvastatin ? ? ?REVIEW OF SYSTEMS  ?Negative except as noted here or in the History of Present Illness. ? ? ?PHYSICAL EXAMINATION  ?Initial Vital Signs ?Blood pressure (!) 157/96, pulse (!) 104, temperature 98.3 ?F (36.8 ?C), temperature source Oral, resp. rate 18, height '5\' 10"'$  (1.778 m), weight 86.2 kg, SpO2 100 %. ? ?Examination ?General: Well-developed, well-nourished female in no acute distress; appearance consistent with age of record ?HENT: normocephalic; atraumatic ?Eyes: pupils equal, round and reactive to light; extraocular muscles intact ?Neck: supple ?Heart: regular rate and rhythm ?Lungs: clear to auscultation bilaterally ?Abdomen: soft; nondistended; nontender; bowel sounds present ?Extremities: No deformity; full range of motion; pulses normal ?Neurologic: Awake, alert and oriented; motor function intact in all extremities and symmetric; no facial droop ?Skin: Warm and dry ?Psychiatric: Normal mood and affect ? ? ?RESULTS  ?Summary of this visit's results, reviewed and interpreted by myself: ? ? EKG Interpretation ? ?Date/Time:  Tuesday Jun 28 2021 00:12:14 EDT ?Ventricular Rate:  88 ?PR Interval:  159 ?QRS Duration: 92 ?QT Interval:  378 ?QTC Calculation: 458 ?R Axis:   152 ?Text Interpretation: Sinus rhythm Probable anterolateral infarct, old No significant change was found Confirmed by Shanon Rosser [phone]) on 06/28/2021 12:22:18 AM ?  ? ?  ? ?Laboratory Studies: ?No results found for this or any previous visit (from the past 24 hour(s)). ?Imaging Studies: ?No results found. ? ?ED COURSE and MDM  ?Nursing notes, initial and subsequent vitals signs, including pulse oximetry, reviewed and interpreted by myself. ? ?Vitals:  ? 06/27/21 2258 06/27/21 2259  06/27/21 2345  ?BP:  (!) 150/97 (!) 157/96  ?Pulse:  (!) 114 (!) 104  ?Resp:  18 18  ?Temp:  98.3 ?F (36.8 ?C)   ?TempSrc:  Oral   ?SpO2:  98% 100%  ?Weight: 86.2 kg    ?Height: '5\' 10"'$  (1.778 m)    ? ?Medications - No data to display ? ?The patient's symptoms are most likely due to Cymbalta withdrawal.  She has many of the documented symptoms.  Cymbalta can cause dizziness/vertigo and it is reasonable that she discontinue it and consider an alternative but she was advised that she will need to taper off the Cymbalta slowly in order to avoid the withdrawal symptoms.  She was advised that the capsules can be opened and the time-released pills inside can be divided up into progressively smaller daily doses.  She should also contact her  prescriber for additional instructions. ? ?PROCEDURES  ?Procedures ? ? ?ED DIAGNOSES  ? ?  ICD-10-CM   ?1. 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  ? ? ? ?  ?Shanon Rosser, MD ?06/28/21 0103 ? ?

## 2021-06-28 NOTE — ED Notes (Signed)
Pt NAD, a/ox4. Pt verbalizes understanding of all DC and f/u instructions. All questions answered. Pt walks with steady gait to lobby at DC.  ? ?

## 2021-06-28 NOTE — Patient Outreach (Signed)
Received a Nurse call center notification for Catherine Mora . ?I have sent a referral to the [place] team to assign for follow up.   ?  ?[place] Care, CBCS, CMAA ?[place] Management Assistant ?[place] Management ?[phone]   ?

## 2021-06-29 ENCOUNTER — Telehealth: Payer: Self-pay | Admitting: *Deleted

## 2021-06-29 ENCOUNTER — Encounter: Payer: Self-pay | Admitting: Internal Medicine

## 2021-06-29 NOTE — Chronic Care Management (AMB) (Signed)
?  Chronic Care Management ?Note ? ?06/29/2021 ?Name: Catherine Mora MRN: [id number] DOB: [birth year] ? ?Catherine Mora is a 71 y.o. year old female who is a primary care patient of Plotnikov, Evie Lacks, MD and is actively engaged with the care management team. I reached out to Devonne Doughty by phone today to assist with scheduling an initial visit with the RN Case Manager ? ?Follow up plan: ?Telephone appointment with care management team member scheduled for:07/05/21 ? ?Catherine Mora  ?Care Guide, Embedded Care Coordination ?[place]  Care Management  ?Direct Dial: [phone] ? ?

## 2021-06-29 NOTE — Progress Notes (Signed)
? ? ? ? ?Subjective:  ? ? Patient ID: Catherine Mora, female    DOB: 1950/09/13, 71 y.o.   MRN: [id number] ? ? ? ? ?HPI ?Catherine Mora is here for follow up from the ED. ? ?She was taking cymbalta - started it  several months ago.  Dose was increased a few months ago.   ? ?Two months ago had ear infection with vertigo.  She went to urgent care and took abx.  She still had the dizziness after completing the abx and was concerned it may be from the cymbalta.  That occurred in 02/2021. ? ?She stopped the cymbalta because she thought it was causing the vertigo.   She took the cymbalta QOD for a few days and then stopped.  She had elevated BP, lightheadedness, ear ringing, nausea and fatigue.  Work up in the ED was negative.  She went back on the cymbalta and she feels better.  It is helping the depression, but she is still anxious.  ? ? ?Still has some dizziness now - it is mild.  The past couple of days it has been very mild.  She still has sinus pressure in ethmoid area.  The dizziness comes with changes in head movmeents.  She thinks she still has some residual infection.  ? ? ?Medications and allergies reviewed with patient and updated if appropriate. ? ?Current Outpatient Medications on File Prior to Visit  ?Medication Sig Dispense Refill  ? ALPRAZolam (XANAX) 1 MG tablet Take 1 tablet (1 mg total) by mouth at bedtime as needed for anxiety. 90 tablet 1  ? BIOTIN FORTE PO Take 500 mg by mouth daily.    ? bisacodyl (DULCOLAX) 5 MG EC tablet Take 1 tablet (5 mg total) by mouth daily as needed for moderate constipation. 30 tablet 3  ? DULoxetine (CYMBALTA) 30 MG capsule TAKE 1 CAPSULE BY MOUTH EVERY DAY 90 capsule 1  ? glimepiride (AMARYL) 1 MG tablet Take 1 mg by mouth daily with breakfast.    ? glimepiride (AMARYL) 2 MG tablet Take 2 mg by mouth every morning.    ? ibuprofen (ADVIL,MOTRIN) 200 MG tablet Take 400 mg by mouth every 6 (six) hours as needed for headache.    ? insulin detemir (LEVEMIR) 100 UNIT/ML FlexPen Inject  16 Units into the skin 2 (two) times daily.    ? Insulin Pen Needle 31G X 5 MM MISC Use with Levemir pen 100 each 0  ? LANTUS SOLOSTAR 100 UNIT/ML Solostar Pen SMARTSIG:16 Unit(s) SUB-Q Twice Daily    ? lisinopril (ZESTRIL) 10 MG tablet Take 1 tablet by mouth daily.    ? lovastatin (MEVACOR) 20 MG tablet TAKE 1 TABLET BY MOUTH EVERY DAY 90 tablet 1  ? NOVOLOG FLEXPEN 100 UNIT/ML FlexPen TAKE AS DIRECTED PER SLIDING SCALE. MAX DAILY DOSE: 50 UNITS/DAY.  3  ? omeprazole (PRILOSEC) 20 MG capsule Take 1 capsule (20 mg total) by mouth daily. Please schedule an yearly office visit for further refills. Thank you 90 capsule 0  ? ONETOUCH VERIO test strip 1 each by Other route 2 (two) times daily.     ? Polyethyl Glycol-Propyl Glycol (SYSTANE FREE OP) Apply to eye.    ? [DISCONTINUED] Pitavastatin Calcium 1 MG TABS Take 1 tablet (1 mg total) by mouth daily. 30 tablet 11  ? ?No current facility-administered medications on file prior to visit.  ? ? ? ?Review of Systems  ?Constitutional:  Negative for fever.  ?HENT:  Positive for ear pain (pressure,  popping), postnasal drip, sinus pressure, sore throat (last night) and tinnitus. Negative for congestion.   ?Respiratory:  Positive for shortness of breath. Negative for cough and wheezing.   ?Cardiovascular:  Positive for palpitations (occ). Negative for chest pain.  ?Neurological:  Positive for dizziness (mild, with head movements) and headaches.  ? ?   ?Objective:  ? ?Vitals:  ? 06/30/21 0920  ?BP: 126/64  ?Pulse: 92  ?Temp: 97.9 ?F (36.6 ?C)  ?SpO2: 98%  ? ?BP Readings from Last 3 Encounters:  ?06/30/21 126/64  ?06/28/21 (!) 148/79  ?03/30/21 (!) 146/80  ? ?Wt Readings from Last 3 Encounters:  ?06/30/21 189 lb 6.4 oz (85.9 kg)  ?06/27/21 190 lb (86.2 kg)  ?03/30/21 191 lb 12.8 oz (87 kg)  ? ?Body mass index is 27.18 kg/m?. ? ?  ?Physical Exam ?Constitutional:   ?   General: She is not in acute distress. ?   Appearance: Normal appearance. She is not ill-appearing.  ?HENT:  ?    Head: Normocephalic and atraumatic.  ?   Right Ear: Tympanic membrane, ear canal and external ear normal.  ?   Left Ear: Tympanic membrane, ear canal and external ear normal.  ?   Mouth/Throat:  ?   Mouth: Mucous membranes are moist.  ?   Pharynx: No oropharyngeal exudate or posterior oropharyngeal erythema.  ?Eyes:  ?   Conjunctiva/sclera: Conjunctivae normal.  ?Cardiovascular:  ?   Rate and Rhythm: Normal rate and regular rhythm.  ?Pulmonary:  ?   Effort: Pulmonary effort is normal. No respiratory distress.  ?   Breath sounds: Normal breath sounds. No wheezing or rales.  ?Musculoskeletal:  ?   Cervical back: Neck supple. No tenderness.  ?Lymphadenopathy:  ?   Cervical: No cervical adenopathy.  ?Skin: ?   General: Skin is warm and dry.  ?Neurological:  ?   Mental Status: She is alert.  ?Psychiatric:  ?   Comments: Mildly anxious  ? ?   ? ?Lab Results  ?Component Value Date  ? WBC 6.3 03/28/2021  ? HGB 12.5 03/28/2021  ? HCT 38.4 03/28/2021  ? PLT 285.0 03/28/2021  ? GLUCOSE 119 (H) 03/28/2021  ? CHOL 266 (H) 03/04/2019  ? TRIG 149.0 03/04/2019  ? HDL 49.80 03/04/2019  ? LDLDIRECT 219.0 08/14/2014  ? LDLCALC 186 (H) 03/04/2019  ? ALT 16 03/28/2021  ? AST 19 03/28/2021  ? NA 141 03/28/2021  ? K 4.8 03/28/2021  ? CL 104 03/28/2021  ? CREATININE 1.01 03/28/2021  ? BUN 24 (H) 03/28/2021  ? CO2 32 03/28/2021  ? TSH 2.83 03/28/2021  ? HGBA1C 6.9 (H) 03/28/2021  ? MICROALBUR 1.9 03/28/2021  ? ? ? ?Assessment & Plan:  ? ? ?Dizziness, sinus infection: ?Subacute ?Sill having some mild dizziness but it is improving - sounds like BPPV or labyrinthitis ?? Related to sinus infection that was partially treated in Jan ?Will try doxycyline mg bid x 10 days ? ?Anxiety, depression: ?Chronic ?Cymbalta is helping ?Still with some anxiety ?She would like to increase to 40 mg and take 20 mg bid - it does make her a little drowsy and is hoping that will help ?Start cymbalta 20 mg bid ?Start xanax 0.5 mg in am prn - encouraged her not to  take this regularly ? ?Insomnia: ?Chronic taking xanax 1 mg nightly  ?Needs a refill ?It is working well ?Refilled today ?

## 2021-06-30 ENCOUNTER — Ambulatory Visit (INDEPENDENT_AMBULATORY_CARE_PROVIDER_SITE_OTHER): Payer: HMO | Admitting: Internal Medicine

## 2021-06-30 VITALS — BP 126/64 | HR 92 | Temp 97.9°F | Ht 70.0 in | Wt 189.4 lb

## 2021-06-30 DIAGNOSIS — R42 Dizziness and giddiness: Secondary | ICD-10-CM | POA: Diagnosis not present

## 2021-06-30 DIAGNOSIS — F329 Major depressive disorder, single episode, unspecified: Secondary | ICD-10-CM

## 2021-06-30 DIAGNOSIS — J019 Acute sinusitis, unspecified: Secondary | ICD-10-CM | POA: Diagnosis not present

## 2021-06-30 DIAGNOSIS — F419 Anxiety disorder, unspecified: Secondary | ICD-10-CM | POA: Diagnosis not present

## 2021-06-30 DIAGNOSIS — G4700 Insomnia, unspecified: Secondary | ICD-10-CM

## 2021-06-30 MED ORDER — ALPRAZOLAM 1 MG PO TABS: ORAL_TABLET | ORAL | 0 refills | Status: DC

## 2021-06-30 MED ORDER — DULOXETINE HCL 20 MG PO CPEP: 20.0000 mg | ORAL_CAPSULE | Freq: Two times a day (BID) | ORAL | 3 refills | Status: DC

## 2021-06-30 MED ORDER — DOXYCYCLINE HYCLATE 100 MG PO TABS: 100.0000 mg | ORAL_TABLET | Freq: Two times a day (BID) | ORAL | 0 refills | Status: AC

## 2021-06-30 NOTE — Patient Instructions (Addendum)
? ? ? ? ?  Medications changes include :   cymbalta 20 mg twice a day.  Xanax 1/2 pill in morning, 1 pill at bedtime, doxycyline twice daily for 10 days ? ? ?Your prescription(s) have been sent to your pharmacy.  ? ? ? ?Return for follow up as scheduled. ? ?

## 2021-07-05 ENCOUNTER — Ambulatory Visit (INDEPENDENT_AMBULATORY_CARE_PROVIDER_SITE_OTHER): Payer: HMO | Admitting: *Deleted

## 2021-07-05 DIAGNOSIS — E119 Type 2 diabetes mellitus without complications: Secondary | ICD-10-CM

## 2021-07-05 DIAGNOSIS — I10 Essential (primary) hypertension: Secondary | ICD-10-CM

## 2021-07-05 NOTE — Patient Instructions (Signed)
Visit Information  ? ?Catherine Mora, thank you for taking time to talk with me today. Please don't hesitate to contact me if I can be of assistance to you before our next scheduled telephone appointment ? ?Below are the goals we discussed today:  ?Patient Self-Care Activities: ?Patient Catherine Mora will: ?Take medications as prescribed ?Attend all scheduled provider appointments ?Call pharmacy for medication refills ?Call provider office for new concerns or questions ?Continue to check fasting (first thing in the morning, before eating) blood sugars at home- the blood sugars from your recent home monitoring all looked great- keep up the great work! ?Continue to follow heart healthy, low salt, low cholesterol, carbohydrate-modified, low sugar diet ?Keep up the great work preventing falls! ?Review enclosed educational material  ? ?Our next scheduled telephone follow up visit/ appointment is scheduled on: Monday, October 03, 2021 at 2:15 pm- This is a PHONE [place] appointment ? ?If you need to cancel or re-schedule our visit, please call [phone] and our care guide team will be happy to assist you. ?  ?I look forward to hearing about your progress. ?  ?[name], RN, BSN, CCRN Alumnus ?[place] ?([phone]: direct office ? ?If you are experiencing a Mental Health or [place] or need someone to talk to, please  ?call the Suicide and Crisis Lifeline: 988 ?call the [place] National Suicide Prevention Lifeline: (726) 718-0058 or TTY: (830) 780-4118 TTY 207-641-1190) to talk to a trained counselor ?call 1-800-273-TALK (toll free, 24 hour hotline) ?go to [facility] [address] [place] [phone]) ?call 911  ? ?Probiotics ?Probiotics are the good bacteria and yeasts that live in your body and keep your digestive system healthy. Probiotics also help your body's defense system (immune system) and protect your body against the  growth of harmful bacteria. Your health care provider may recommend taking a probiotic if you are taking antibiotic medicine or have certain medical conditions, such as: ?Diarrhea. ?Constipation. ?Irritable bowel syndrome. ?Lung infections. ?Yeast infections. ?Acne, eczema, and other skin conditions. ?Frequent urinary tract infections. ?What affects the balance of bacteria in my body? ?The balance of good bacteria in your body can be affected by: ?Antibiotics. These medicines treat infections caused by bacteria. Unfortunately, they may kill the good bacteria in your body as well as the bad bacteria. ?Certain medical conditions. Conditions related to an imbalance of bacteria include: ?Stomach and intestine (gastrointestinal) infections. ?Lung infections. ?Skin infections. ?Vaginal infections. ?Inflammatory bowel diseases. ?Stomach ulcers (gastric ulcers). ?Tooth decay and gum disease (periodontal disease). ?Stress. ?A low-fiber diet. ?What type of probiotic is right for me? ?Probiotics contain different types of bacteria (strains). Strains commonly found in probiotics include: ?Lactobacillus. ?Saccharomyces. ?Bifidobacterium. ?Specific strains have been shown to be more effective for certain health conditions. Ask your health care provider which strain or strains you should use and how often. ?Probiotics come in many different forms, strain combinations, and strengths. Some may need to be refrigerated. Always read the label for storage and usage instructions. ?Certain foods, such as yogurt, contain probiotics. Probiotics can also be bought as a supplement at a pharmacy, health food store, or grocery store. Talk to your health care provider before starting any supplement. ?What are the side effects of probiotics? ?Some people have side effects when taking probiotics. Side effects are usually temporary and may include: ?Gas. ?Bloating. ?Cramping. ?Serious side effects are rare. ?Follow these instructions at  home: ? ?Eat foods high in fiber, such as whole  grains, beans, and vegetables. These foods can help good bacteria grow. ?Avoid certain foods as told by your health care provider. ?If you are taking probiotics with antibiotic medicine, take your probiotics as told by your health care provider. You may have to take probiotics for several weeks. This is to help the growth of good bacteria in your gut. ?Where to find more information ?International Scientific Association for Probiotics and Prebiotics: isappscience.org ?Summary ?Probiotics are the good bacteria and yeasts that live in your body and keep you and your digestive system healthy. ?Certain foods, such as yogurt, contain probiotics. ?Probiotics can be taken as supplements. They can be bought at a pharmacy, health food store, or grocery store. They come in many different forms, strain combinations, and strengths. ?Be sure to talk with your health care provider before taking a probiotic supplement. ?This information is not intended to replace advice given to you by your health care provider. Make sure you discuss any questions you have with your health care provider. ?Document Revised: 11/24/2020 Document Reviewed: 11/24/2020 ?Elsevier Patient Education ? [place]. ? ?Following is a copy of your full care plan:  ?Care Plan : [place] of Care  ?Updates made by Knox Royalty, RN since 07/05/2021 12:00 AM  ?  ? ?Problem: Chronic Disease Management Needs   ?  ? ?Long-Range Goal: Development of plan of care for long term chronic disease management   ?Start Date: 07/05/2021  ?Expected End Date: 07/06/2022  ?Priority: High  ?Note:   ?Current Barriers:  ?Chronic Disease Management support and education needs related to HTN and DMII ? ?RNCM Clinical Goal(s):  ?Patient will demonstrate ongoing health management independence as evidenced by adherence to plan of care for DMII; HTN        through collaboration with RN Care manager, provider, and care team.   ? ?Interventions: ?1:1 collaboration with primary care provider regarding development and update of comprehensive plan of care as evidenced by provider attestation and co-signature ?Inter-disciplinary care team collaboration (see longitudinal plan of care) ?Evaluation of current treatment plan related to  self management and patient's adherence to plan as established by provider ?CCM RN CM Initial assessment completed 07/05/21 ?Review of patient status, including review of consultants reports, relevant laboratory and other test results, and medications completed ?SDOH assessment completed: no unmet concerns identified ?Depression screening completed: no concerns identified ?Pain assessment completed: denies pain ?Falls assessment updated: denies recent falls x 12 months- currently does not use assistive devices;  positive reinforcement provided with encouragement to continue efforts at fall prevention; education/ discussion around fall risks/ prevention provided ?Medications discussed: reports independently self-manages all aspects of medication administration; uses pill box; denies current concerns/ issues/ questions around medications; endorses adherence to taking all medications as prescribed ?Reviewed recent ED visit 06/27/21-- patient verbalizes very good understanding of post- discharge instructions and tells me she is doing "much better" after recent ED visit ?Reviewed recent PCP (Dr. Quay Burow covering for Dr. Alain Marion) office visit 06/30/21: she verbalizes a very good understanding of post-office visit instructions and denies questions ?Confirms taking antibiotics as prescribed BID; denies GI side effects; discussed benefits of adding pro-biotic as indicated: she will consider ?Confirms taking newly prescribed dose of Cymbalta-- states taking 20 mg BID is "much better;" she reports previous concerns/ side effects form taking daily cymbalta have now resolved ?Reviewed upcoming scheduled provider appointments:  09/28/21- PCP; patient confirms is aware of all and has plans to attend as scheduled ?Discussed plans with patient  for ongoing care management follow up and provided patient with direct contact information for care

## 2021-07-05 NOTE — Chronic Care Management (AMB) (Addendum)
Chronic Care Management   CCM RN Visit Note  07/05/2021 Name: Catherine Mora MRN: [id number] DOB: 03-07-1950  Subjective: Catherine Mora is a 71 y.o. year old female who is a primary care patient of Plotnikov, Evie Lacks, MD. The care management team was consulted for assistance with disease management and care coordination needs.    Engaged with patient by telephone for initial visit in response to provider referral for case management and/or care coordination services.   Consent to Services:  The patient was given information about Chronic Care Management services, agreed to services, and gave verbal consent 07/02/20 prior to initiation of services.  Please see initial visit note for detailed documentation.  Patient agreed to services and verbal consent obtained.   Assessment: Review of patient past medical history, allergies, medications, health status, including review of consultants reports, laboratory and other test data, was performed as part of comprehensive evaluation and provision of chronic care management services.   SDOH (Social Determinants of Health) assessments and interventions performed:  SDOH Interventions    Flowsheet Row Most Recent Value  SDOH Interventions   Food Insecurity Interventions Intervention Not Indicated  [denies food insecurity]  Housing Interventions Intervention Not Indicated  [lives with spouse in 2 level single family home x 10 years,  denies concerns around housing]  Transportation Interventions Intervention Not Indicated  [Patient drives self]     CCM Care Plan  Allergies  Allergen Reactions   Metformin Other (See Comments)    Hair loss   Ciprofloxacin Hives    REACTION: itching   Crestor [Rosuvastatin Calcium] Other (See Comments)    arthralgia   Hydrochlorothiazide Other (See Comments)    REACTION: hair loss   Invokana [Canagliflozin] Other (See Comments)    ketoacidosis   Paroxetine Other (See Comments)    REACTION: living in glass    Penicillins Hives   Simvastatin Other (See Comments)    REACTION: aches with statins   Outpatient Encounter Medications as of 07/05/2021  Medication Sig Note   ALPRAZolam (XANAX) 1 MG tablet Take 0.5 mg in morning and 1 mg at night prn    BIOTIN FORTE PO Take 500 mg by mouth daily.    bisacodyl (DULCOLAX) 5 MG EC tablet Take 1 tablet (5 mg total) by mouth daily as needed for moderate constipation.    doxycycline (VIBRA-TABS) 100 MG tablet Take 1 tablet (100 mg total) by mouth 2 (two) times daily for 10 days.    DULoxetine (CYMBALTA) 20 MG capsule Take 1 capsule (20 mg total) by mouth 2 (two) times daily.    glimepiride (AMARYL) 1 MG tablet Take 1 mg by mouth daily with breakfast. (Patient not taking: Reported on 07/05/2021)    glimepiride (AMARYL) 2 MG tablet Take 2 mg by mouth every morning.    ibuprofen (ADVIL,MOTRIN) 200 MG tablet Take 400 mg by mouth every 6 (six) hours as needed for headache.    insulin detemir (LEVEMIR) 100 UNIT/ML FlexPen Inject 16 Units into the skin 2 (two) times daily.    Insulin Pen Needle 31G X 5 MM MISC Use with Levemir pen    LANTUS SOLOSTAR 100 UNIT/ML Solostar Pen SMARTSIG:16 Unit(s) SUB-Q Twice Daily    lisinopril (ZESTRIL) 10 MG tablet Take 1 tablet by mouth daily.    lovastatin (MEVACOR) 20 MG tablet TAKE 1 TABLET BY MOUTH EVERY DAY    NOVOLOG FLEXPEN 100 UNIT/ML FlexPen TAKE AS DIRECTED PER SLIDING SCALE. MAX DAILY DOSE: 50 UNITS/DAY.    omeprazole (PRILOSEC)  20 MG capsule Take 1 capsule (20 mg total) by mouth daily. Please schedule an yearly office visit for further refills. Thank you    ONETOUCH VERIO test strip 1 each by Other route 2 (two) times daily.  08/27/2014: Received from: External Pharmacy Received Sig:    Polyethyl Glycol-Propyl Glycol (SYSTANE FREE OP) Apply to eye.    [DISCONTINUED] Pitavastatin Calcium 1 MG TABS Take 1 tablet (1 mg total) by mouth daily.    No facility-administered encounter medications on file as of 07/05/2021.   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   Dyslipidemia 08/03/2020   Depression 07/27/2020   Pharyngoesophageal dysphagia 04/08/2020   Throat soreness 04/08/2020   Acute sinusitis 03/09/2020   Coronary atherosclerosis 03/12/2019   Leg hematoma, right, initial encounter 10/12/2017   Skin nodule 10/12/2017   Allergic rhinitis 10/03/2016   Rash 06/26/2016   Long-term insulin use ([place]) 10/05/2015   Overweight (BMI 25.0-29.9) 06/18/2015   Malnutrition of moderate degree ([place]) 08/18/2014   Dehydration 08/17/2014   Nausea with vomiting 08/17/2014   DKA (diabetic ketoacidoses) 08/17/2014   AKI (acute kidney injury) ([place]) 08/17/2014   Polydipsia 08/13/2014   Type 2 diabetes mellitus without complication ([place]) 79/03/4095   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   URI, acute 12/03/2012   Urticaria 12/03/2012   Cervical adenopathy 12/03/2012   Hypertensive disorder 10/03/2011   Sleep disturbance 10/03/2011   Well adult exam 05/24/2010   CHOLELITHIASIS 05/26/2009   HAIR LOSS 03/12/2009   DIZZINESS 06/30/2008   GLUCOSE INTOLERANCE 05/08/2007   Adjustment disorder with mixed anxiety and depressed mood 05/08/2007   Esophageal stricture 05/08/2007   IBS 05/08/2007   FATIGUE 05/08/2007   Chest pain with moderate risk for cardiac etiology 05/08/2007   ABNORMAL GLUCOSE NEC 09/15/2006   Conditions to be addressed/monitored:  HTN and DMII  Care Plan : RN Care Manager Plan of Care  Updates made by Knox Royalty, RN since 07/05/2021 12:00 AM     Problem: Chronic Disease Management Needs      Long-Range Goal: Development of plan of care for long term chronic disease management   Start Date: 07/05/2021  Expected End Date: 07/06/2022  Priority: High  Note:   Current Barriers:  Chronic Disease Management support and education needs related to HTN and DMII  RNCM Clinical Goal(s):  Patient will  demonstrate ongoing health management independence as evidenced by adherence to plan of care for DMII; HTN        through collaboration with RN Care manager, provider, and care team.   Interventions: 1:1 collaboration with primary care provider regarding development and update of comprehensive plan of care as evidenced by provider attestation and co-signature Inter-disciplinary care team collaboration (see longitudinal plan of care) Evaluation of current treatment plan related to  self management and patient's adherence to plan as established by provider CCM RN CM Initial assessment completed 07/05/21 Review of patient status, including review of consultants reports, relevant laboratory and other test results, and medications completed SDOH assessment completed: no unmet concerns identified Depression screening completed: no concerns identified Pain assessment completed: denies pain Falls assessment updated: denies recent falls x 12 months- currently does not use assistive devices;  positive reinforcement provided with encouragement to continue efforts at fall prevention; education/ discussion around fall risks/ prevention provided Medications discussed: reports independently self-manages all aspects of medication administration; uses pill box; denies current concerns/  issues/ questions around medications; endorses adherence to taking all medications as prescribed Reviewed recent ED visit 06/27/21-- patient verbalizes very good understanding of post- discharge instructions and tells me she is doing "much better" after recent ED visit Reviewed recent PCP (Dr. Quay Burow covering for Dr. Alain Marion) office visit 06/30/21: she verbalizes a very good understanding of post-office visit instructions and denies questions Confirms taking antibiotics as prescribed BID; denies GI side effects; discussed benefits of adding pro-biotic as indicated: she will consider Confirms taking newly prescribed dose of Cymbalta--  states taking 20 mg BID is "much better;" she reports previous concerns/ side effects form taking daily cymbalta have now resolved Reviewed upcoming scheduled provider appointments: 09/28/21- PCP; patient confirms is aware of all and has plans to attend as scheduled Discussed plans with patient for ongoing care management follow up and provided patient with direct contact information for care management team     Diabetes:  (Status: 07/05/21: New goal.) Long Term Goal   Lab Results  Component Value Date   HGBA1C 6.9 (H) 03/28/2021    Provided education to patient about basic DM disease process; Counseled on importance of regular laboratory monitoring as prescribed;        Confirmed patient consistently monitoring fasting blood sugars at home Reviewed recent blood sugars from home monitoring: she reports consistent values between 81-110; she denies significant lows or highs Confirmed patient aware of signs/ symptoms low blood sugar (has had in past, not recently) along with corresponding action plan Confirmed patient verbalizes good understanding of prescribed diet and endorses adherence to diet Assessed patient's understanding of meaning/ significance of A1-C values: good baseline understanding of same- Reviewed individual historical A1-C trends and provided education around correlation of A1-C value to blood sugar levels at home over 3 months Confirmed patient has good understanding of medications for DMII- she administers her own insulin QD and only takes sliding scale "rarely;" states she monitors "extra" and takes as indicated if she "feels" as if her sugars may be high and/ or if they are high during morning check-- which she reports is "rare;" reports has not needed sliding scale insulin "in quite a long time"  Hypertension: (Status: 07/05/21 New goal.) Long Term Goal  Last practice recorded BP readings:  BP Readings from Last 3 Encounters:  06/30/21 126/64  06/28/21 (!) 148/79  03/30/21  (!) 146/80  Most recent eGFR/CrCl: No results found for: EGFR  No components found for: CRCL  Evaluation of current treatment plan related to hypertension self management and patient's adherence to plan as established by provider;   Reviewed prescribed diet heart healthy, low cholesterol, low salt, carbohydrate modified, low sugar Counseled on the importance of exercise goals with target of 150 minutes per week Discussed complications of poorly controlled blood pressure such as heart disease, stroke, circulatory complications, vision complications, kidney impairment, sexual dysfunction;  Confirms patient does not currently monitor blood pressures at home Confirmed patient is active at baseline: she walks daily; positive reinforcement provided with encouragement to continue efforts  Patient Goals/Self-Care Activities: As evidenced by review of EHR, collaboration with care team, and patient reporting during CCM RN CM outreach,  Patient Catherine Mora will: Take medications as prescribed Attend all scheduled provider appointments Call pharmacy for medication refills Call provider office for new concerns or questions Continue to check fasting (first thing in the morning, before eating) blood sugars at home- the blood sugars from your recent home monitoring all looked great- keep up the great work! Continue to follow heart healthy,  low salt, low cholesterol, carbohydrate-modified, low sugar diet Keep up the great work preventing falls! Review enclosed educational material        Plan: Telephone follow up appointment with care management team member scheduled for: Monday, October 03, 2021 at 2:15 pm The patient has been provided with contact information for the care management team and has been advised to call with any health related questions or concerns   [name], RN, BSN, [place] [phone]: direct office  Medical screening  examination/treatment/procedure(s) were performed by non-physician practitioner and as supervising physician I was immediately available for consultation/collaboration.  I agree with above. Lew Dawes, MD

## 2021-07-20 DIAGNOSIS — Z794 Long term (current) use of insulin: Secondary | ICD-10-CM | POA: Diagnosis not present

## 2021-07-20 DIAGNOSIS — E1159 Type 2 diabetes mellitus with other circulatory complications: Secondary | ICD-10-CM

## 2021-07-20 DIAGNOSIS — I10 Essential (primary) hypertension: Secondary | ICD-10-CM | POA: Diagnosis not present

## 2021-07-23 ENCOUNTER — Other Ambulatory Visit: Payer: Self-pay | Admitting: Internal Medicine

## 2021-08-15 ENCOUNTER — Ambulatory Visit (INDEPENDENT_AMBULATORY_CARE_PROVIDER_SITE_OTHER): Admit: 2021-08-15 | Discharge: 2021-08-15 | Disposition: A | Discharge status: Home / Self Care | Payer: Medicare PPO

## 2021-08-15 ENCOUNTER — Ambulatory Visit (INDEPENDENT_AMBULATORY_CARE_PROVIDER_SITE_OTHER): Payer: Medicare PPO | Admitting: Physician Assistant

## 2021-08-15 ENCOUNTER — Other Ambulatory Visit: Payer: Self-pay

## 2021-08-15 VITALS — BP 101/52 | HR 77 | Temp 97.7°F | Resp 15

## 2021-08-15 DIAGNOSIS — M79645 Pain in left finger(s): Secondary | ICD-10-CM

## 2021-08-15 DIAGNOSIS — M778 Other enthesopathies, not elsewhere classified: Secondary | ICD-10-CM

## 2021-08-15 DIAGNOSIS — M65242 Calcific tendinitis, left hand: Secondary | ICD-10-CM

## 2021-08-15 NOTE — Patient Instructions (Signed)
-   My initial review of your X-rays showed no fractures. There are, however, small calcium deposits in the extensor tendon of the left middle finger  ALL X-rays are also reviewed by a Board Certified Radiologist and you will receive a call if there are any findings noted by the radiologist that were not discussed with you at the time of your visit.     - Please wear your Finger Brace when active and while asleep. You may remove it to bathe    - Please begin the Home PT exercises in 3-4 days. You may do them at home for 20-30 mins at a time, once or twice a day. Please do not exercise past the point of pain, but it is IMPORTANT that you continue to move and exercise the finger to both rehabilitate the injury and to strengthen the muscles around the finger joint to prevent future injuries.    - You may use OTC diclofenac gel (Voltaren), following package dosing directions    - You may use 220 mg of naproxen every 12-24 hours as needed for pain, you may also alternate with 325 mg of acetaminophen every 4-6 hours, in between doses of naproxen as needed. DO NOT exceed 3000 mg of acetaminophen in a 24 hr period.    For most musculoskeletal (bone/joint) injuries, the P.R.I.C.E. Protocol is your best "go-to" home treatment, especially within the first 24-48 hrs after an injury:  P: Protect from re-injury   R: Rest the affected area  I: apply Ice over a barrier (thin towel/cloth) to reduce swelling (do not freeze the area)  C: gentle Compression can be used as tolerated to reduce swelling in addition to  E: Elevation of the affected area, at or above the level of the heart.    - Please FOLLOW-UP with your Ortho-Hand provider in the next 1-2 weeks    - If -at any point- you notice increased swelling or pain not well controlled with the medications, joint redness/warmth, calf pain, chest pain/pressure, shortness of breath, cough, coughing-up blood, dizziness/lightheadedness, or loss of consciousness, please call 911 and/or seek  IMMEDIATE attention in the ER

## 2021-08-15 NOTE — Progress Notes (Signed)
Health maintenance advised and declined to be addressed by PCP. Medication reconciliation, allergies, pharmacy verified and vitals taken by Arturo Morton, MAC, STR

## 2021-08-15 NOTE — Progress Notes (Signed)
[facility] NOTE  10:21 AM     Chief Complaint   Patient presents with    Finger Pain     Yesterday lifting a dog dish onset px left 3rd IP, poss sublux tendon.      SUBJECTIVE:  History of Present Illness:  Paula Bowman is a 71 year old RHD female who presents c/o L 3rd finger pain after picking up a heavy dog dish x 1 day.    She noted a pain to the dorsal aspect of the PIPJ of the 3rd finger, worse w/ movement, improved w/ rest and using buddy-tape    She reports her wife is a PT- wonders if tendon subluxation could be the source of her pain. She notes that she could "see something moving beneath the skin" at the dorsum of the PIPJ while flexing, but notes that this has never caused pain before.    She has a h/o OA- has voltaren gel but has not ysed it on her finger as of yet.    She is also due to have R hand surgery- is followed by Ortho Hand.    Pt denies n/t/w, inability to bear weight w/ the LUE, hx of chronic liver or kidney dz    Review of Systems:  General: negative  Skin:  negative  Neuro: negative  Rheum: negative   MSK: see HPI    Social History:  Social History     Tobacco Use    Smoking status: Former     Packs/day: 2.00     Years: 15.00     Pack years: 30.00     Types: Cigarettes     Quit date: 01/07/1988     Years since quitting: 33.6    Smokeless tobacco: Never   Substance Use Topics    Alcohol use: Yes     Alcohol/week: 0.8 standard drinks     Types: 1 Standard drinks or equivalent per week     Comment: 2 glasses of wine daily    Drug use: No     Past Medical History:  Past Medical History:   Diagnosis Date    Malignant neoplasm of breast (female), unspecified site 2008    lumpectomy: Stage 0; Radiation; was ERP; She did not complete Tamoxifen due to side effects x1 yr.     Asthma     Esophageal reflux     History of radiation therapy     Microscopic colitis       Allergies:  Review of patient's allergies indicates:  Allergies   Allergen Reactions    Amoxicillin Skin: Rash     Erythromycin     Levofloxacin Skin: Itching     Redness up arm from IV dose    Penicillin G Skin: Rash    Penicillins Skin: Rash    Terbinafine Hcl      Loss of sense of taste     Surgical History:  Past Surgical History:   Procedure Laterality Date    BREAST BIOPSY Right     BREAST LUMPECTOMY Right     HERNIA REPAIR      Knee, R knee at age 28, removed floating bone      PR ARTHRP KNE CONDYLE&PLATU MEDIAL&LAT COMPARTMENTS      PR COLONOSCOPY STOMA DX INCLUDING COLLJ SPEC SPX  2006    [place] , FU 10 years    PR TOTAL ABDOMINAL HYSTERECT W/WO RMVL TUBE OVARY      for fibroids  PR UNLISTED PROCEDURE FEMUR/KNEE Right 1969    PR VAGINAL HYSTERECTOMY UTERUS 250 GM/<  1994    Ovaries in place     Family History:  Family History       Problem (# of Occurrences) Relation (Name,Age of Onset)    Breast Cancer (1) Sister (38)           Negative family history of: Colon Cancer, Ovarian Cancer          Medications Currently Taking:  Current Outpatient Medications   Medication Sig Dispense Refill    albuterol HFA (ProAir HFA) 108 (90 Base) MCG/ACT inhaler Inhale 2 puffs by mouth 3 times a day as needed for shortness of breath/wheezing. 8.5 g 0    ascorbic acid 500 MG tablet Take 1 tablet (500 mg) by mouth.      cyclobenzaprine 5 MG tablet Take 1 tablet (5 mg) by mouth at bedtime as needed for muscle spasms. May increase to '10mg'$  if needed. 20 tablet 0    famotidine 10 MG tablet Take by mouth.      magnesium oxide 400 MG tablet Take 1 tablet (400 mg) by mouth.      Multiple Vitamin (Multi-Vitamin) tablet Take 1 tablet by mouth.      naproxen (Aleve) 220 MG capsule Take 2 capsules (440 mg) by mouth every 12 hours as needed for pain. 20 capsule 0    simvastatin 10 MG tablet Take 0.5 tablets (5 mg) by mouth daily.      triamcinolone 0.5 % ointment Use twice a day for 2 weeks 80 g 0     No current facility-administered medications for this visit.      VITALS:  BP (!) 101/52   Pulse 77   Temp 36.5 C (Temporal)   Resp 15   SpO2  100%     PHYSICAL EXAM:  GENERAL APPEARANCE: WD/WN, non-toxic appearing, alert, in NAD, and cooperative.  HEAD: NC/AT.  NECK: FROM  CHEST: Respirations even & unlabored ORA.   CV: RRR, 2+ L radial pulse  SKIN: Warm, dry  EXTREMITIES: No edema, no cyanosis.   MSK: steady gait, coordination normal.     Physical Exam  Musculoskeletal:        Hands:      X-RAY:  My review of 3-view L FINGER XR indicates no acute fractures, subluxations, or dislocations. Calcific tendinosis noted over the DIPJ and DIPJ on the lateral film.  Bone spurs noted at the lateral aspect of the dorsal DIPJ incidentally, likely sequela of OA.   Radiology Read is PENDING    ASSESSMENT AND PLAN  Paula Bowman is a 71 year old female presenting to clinic today with the following medical problems:     1. Tendonitis of finger  - XR Finger 2+ View Left; Future  - My initial review of your X-rays showed no fractures. There are, however, small calcium deposits in the extensor tendon of the left middle finger  ALL X-rays are also reviewed by a Board Certified Radiologist and you will receive a call if there are any findings noted by the radiologist that were not discussed with you at the time of your visit.     - Please wear your Finger Brace when active and while asleep. You may remove it to bathe    - Please begin the Home PT exercises in 3-4 days. You may do them at home for 20-30 mins at a time, once or twice a day. Please do not exercise  past the point of pain, but it is IMPORTANT that you continue to move and exercise the finger to both rehabilitate the injury and to strengthen the muscles around the finger joint to prevent future injuries.    - You may use OTC diclofenac gel (Voltaren), following package dosing directions    - You may use 220 mg of naproxen every 12-24 hours as needed for pain, you may also alternate with 325 mg of acetaminophen every 4-6 hours, in between doses of naproxen as needed. DO NOT exceed 3000 mg of acetaminophen  in a 24 hr period.    For most musculoskeletal (bone/joint) injuries, the P.R.I.C.E. Protocol is your best "go-to" home treatment, especially within the first 24-48 hrs after an injury:  P: Protect from re-injury   R: Rest the affected area  I: apply Ice over a barrier (thin towel/cloth) to reduce swelling (do not freeze the area)  C: gentle Compression can be used as tolerated to reduce swelling in addition to  E: Elevation of the affected area, at or above the level of the heart.    - Please FOLLOW-UP with your Ortho-Hand provider in the next 1-2 weeks    - If -at any point- you notice increased swelling or pain not well controlled with the medications, joint redness/warmth, calf pain, chest pain/pressure, shortness of breath, cough, coughing-up blood, dizziness/lightheadedness, or loss of consciousness, please call 911 and/or seek IMMEDIATE attention in the ER     2. Calcific tendinitis of hand, left  - AS ABOVE    Charlett Blake, PA-C  Physician Assistant  [facility]    This document was generated in part using voice recognition software, occasional wrong-word or "sound-alike" substitutions may have occurred due to the inherent limitations of voice recognition software. Read the chart carefully and recognize, using context, where these substitutions have occurred.

## 2021-09-01 ENCOUNTER — Telehealth (INDEPENDENT_AMBULATORY_CARE_PROVIDER_SITE_OTHER): Payer: Self-pay | Admitting: Internal Medicine

## 2021-09-01 DIAGNOSIS — E782 Mixed hyperlipidemia: Secondary | ICD-10-CM

## 2021-09-01 MED ORDER — SIMVASTATIN 10 MG OR TABS
5.0000 mg | ORAL_TABLET | Freq: Every day | ORAL | 2 refills | Status: DC
Start: 2021-09-01 — End: 2022-06-02

## 2021-09-01 NOTE — Telephone Encounter (Signed)
RETURN CALL: Voicemail - Detailed Message      SUBJECT:  Refill Request     NAME OF MEDICATION(S): Simvastatin '10mg'$   DATE NEEDED BY: end of July  PRESCRIBING PROVIDER: Dr. Gershon Mussel  PHARMACY NAME/LOCATION: see below  ADDITIONAL INFORMATION:     Patient calling in to request refill on the simvastatin drug. Can Dr. Gershon Mussel please authorize a refill? Patient states it is working well.      Pharmacy    [phone] [address] [address] Phone: [phone] Fax: [phone] DEA #: [id number]

## 2021-09-19 ENCOUNTER — Ambulatory Visit (INDEPENDENT_AMBULATORY_CARE_PROVIDER_SITE_OTHER): Payer: PPO

## 2021-09-19 DIAGNOSIS — Z780 Asymptomatic menopausal state: Secondary | ICD-10-CM | POA: Diagnosis not present

## 2021-09-19 DIAGNOSIS — Z0000 Encounter for general adult medical examination without abnormal findings: Secondary | ICD-10-CM | POA: Diagnosis not present

## 2021-09-19 DIAGNOSIS — Z1231 Encounter for screening mammogram for malignant neoplasm of breast: Secondary | ICD-10-CM | POA: Diagnosis not present

## 2021-09-19 NOTE — Patient Instructions (Signed)
Catherine Mora , Thank you for taking time to come for your Medicare Wellness Visit. I appreciate your ongoing commitment to your health goals. Please review the following plan we discussed and let me know if I can assist you in the future.   Screening recommendations/referrals: Colonoscopy: 020/05/2019 Mammogram: referral 09/19/2021 Bone Density: referral 09/19/2021 Recommended yearly ophthalmology/optometry visit for glaucoma screening and checkup Recommended yearly dental visit for hygiene and checkup  Vaccinations: Influenza vaccine: completed  Pneumococcal vaccine: completed  Tdap vaccine: 10/10/2012 Shingles vaccine: will consider     Advanced directives: yes   Conditions/risks identified: none   Next appointment: none    Preventive Care 55 Years and Older, Female Preventive care refers to lifestyle choices and visits with your health care provider that can promote health and wellness. What does preventive care include? A yearly physical exam. This is also called an annual well check. Dental exams once or twice a year. Routine eye exams. Ask your health care provider how often you should have your eyes checked. Personal lifestyle choices, including: Daily care of your teeth and gums. Regular physical activity. Eating a healthy diet. Avoiding tobacco and drug use. Limiting alcohol use. Practicing safe sex. Taking low-dose aspirin every day. Taking vitamin and mineral supplements as recommended by your health care provider. What happens during an annual well check? The services and screenings done by your health care provider during your annual well check will depend on your age, overall health, lifestyle risk factors, and family history of disease. Counseling  Your health care provider may ask you questions about your: Alcohol use. Tobacco use. Drug use. Emotional well-being. Home and relationship well-being. Sexual activity. Eating habits. History of  falls. Memory and ability to understand (cognition). Work and work Statistician. Reproductive health. Screening  You may have the following tests or measurements: Height, weight, and BMI. Blood pressure. Lipid and cholesterol levels. These may be checked every 5 years, or more frequently if you are over 30 years old. Skin check. Lung cancer screening. You may have this screening every year starting at age 72 if you have a 30-pack-year history of smoking and currently smoke or have quit within the past 15 years. Fecal occult blood test (FOBT) of the stool. You may have this test every year starting at age 10. Flexible sigmoidoscopy or colonoscopy. You may have a sigmoidoscopy every 5 years or a colonoscopy every 10 years starting at age 60. Hepatitis C blood test. Hepatitis B blood test. Sexually transmitted disease (STD) testing. Diabetes screening. This is done by checking your blood sugar (glucose) after you have not eaten for a while (fasting). You may have this done every 1-3 years. Bone density scan. This is done to screen for osteoporosis. You may have this done starting at age 71. Mammogram. This may be done every 1-2 years. Talk to your health care provider about how often you should have regular mammograms. Talk with your health care provider about your test results, treatment options, and if necessary, the need for more tests. Vaccines  Your health care provider may recommend certain vaccines, such as: Influenza vaccine. This is recommended every year. Tetanus, diphtheria, and acellular pertussis (Tdap, Td) vaccine. You may need a Td booster every 10 years. Zoster vaccine. You may need this after age 68. Pneumococcal 13-valent conjugate (PCV13) vaccine. One dose is recommended after age 23. Pneumococcal polysaccharide (PPSV23) vaccine. One dose is recommended after age 56. Talk to your health care provider about which screenings and vaccines you need and  how often you need  them. This information is not intended to replace advice given to you by your health care provider. Make sure you discuss any questions you have with your health care provider. Document Released: 03/05/2015 Document Revised: 10/27/2015 Document Reviewed: 12/08/2014 Elsevier Interactive Patient Education  2017 [place] Prevention in the Home Falls can cause injuries. They can happen to people of all ages. There are many things you can do to make your home safe and to help prevent falls. What can I do on the outside of my home? Regularly fix the edges of walkways and driveways and fix any cracks. Remove anything that might make you trip as you walk through a door, such as a raised step or threshold. Trim any bushes or trees on the path to your home. Use bright outdoor lighting. Clear any walking paths of anything that might make someone trip, such as rocks or tools. Regularly check to see if handrails are loose or broken. Make sure that both sides of any steps have handrails. Any raised decks and porches should have guardrails on the edges. Have any leaves, snow, or ice cleared regularly. Use sand or salt on walking paths during winter. Clean up any spills in your garage right away. This includes oil or grease spills. What can I do in the bathroom? Use night lights. Install grab bars by the toilet and in the tub and shower. Do not use towel bars as grab bars. Use non-skid mats or decals in the tub or shower. If you need to sit down in the shower, use a plastic, non-slip stool. Keep the floor dry. Clean up any water that spills on the floor as soon as it happens. Remove soap buildup in the tub or shower regularly. Attach bath mats securely with double-sided non-slip rug tape. Do not have throw rugs and other things on the floor that can make you trip. What can I do in the bedroom? Use night lights. Make sure that you have a light by your bed that is easy to reach. Do not use  any sheets or blankets that are too big for your bed. They should not hang down onto the floor. Have a firm chair that has side arms. You can use this for support while you get dressed. Do not have throw rugs and other things on the floor that can make you trip. What can I do in the kitchen? Clean up any spills right away. Avoid walking on wet floors. Keep items that you use a lot in easy-to-reach places. If you need to reach something above you, use a strong step stool that has a grab bar. Keep electrical cords out of the way. Do not use floor polish or wax that makes floors slippery. If you must use wax, use non-skid floor wax. Do not have throw rugs and other things on the floor that can make you trip. What can I do with my stairs? Do not leave any items on the stairs. Make sure that there are handrails on both sides of the stairs and use them. Fix handrails that are broken or loose. Make sure that handrails are as long as the stairways. Check any carpeting to make sure that it is firmly attached to the stairs. Fix any carpet that is loose or worn. Avoid having throw rugs at the top or bottom of the stairs. If you do have throw rugs, attach them to the floor with carpet tape. Make sure that you have  a light switch at the top of the stairs and the bottom of the stairs. If you do not have them, ask someone to add them for you. What else can I do to help prevent falls? Wear shoes that: Do not have high heels. Have rubber bottoms. Are comfortable and fit you well. Are closed at the toe. Do not wear sandals. If you use a stepladder: Make sure that it is fully opened. Do not climb a closed stepladder. Make sure that both sides of the stepladder are locked into place. Ask someone to hold it for you, if possible. Clearly mark and make sure that you can see: Any grab bars or handrails. First and last steps. Where the edge of each step is. Use tools that help you move around (mobility aids)  if they are needed. These include: Canes. Walkers. Scooters. Crutches. Turn on the lights when you go into a dark area. Replace any light bulbs as soon as they burn out. Set up your furniture so you have a clear path. Avoid moving your furniture around. If any of your floors are uneven, fix them. If there are any pets around you, be aware of where they are. Review your medicines with your doctor. Some medicines can make you feel dizzy. This can increase your chance of falling. Ask your doctor what other things that you can do to help prevent falls. This information is not intended to replace advice given to you by your health care provider. Make sure you discuss any questions you have with your health care provider. Document Released: 12/03/2008 Document Revised: 07/15/2015 Document Reviewed: 03/13/2014 Elsevier Interactive Patient Education  2017 [employer].

## 2021-09-19 NOTE — Progress Notes (Addendum)
Subjective:   Catherine Mora is a 71 y.o. female who presents for Medicare Annual (Subsequent) preventive examination.   I connected with Catherine Mora  today by telephone and verified that I am speaking with the correct person using two identifiers. Location patient: home Location provider: work Persons participating in the virtual visit: patient, provider.   I discussed the limitations, risks, security and privacy concerns of performing an evaluation and management service by telephone and the availability of in person appointments. I also discussed with the patient that there may be a patient responsible charge related to this service. The patient expressed understanding and verbally consented to this telephonic visit.    Interactive audio and video telecommunications were attempted between this provider and patient, however failed, due to patient having technical difficulties OR patient did not have access to video capability.  We continued and completed visit with audio only.    Review of Systems     Cardiac Risk Factors include: advanced age (>80mn, >>16women)     Objective:    Today's Vitals   There is no height or weight on file to calculate BMI.     09/19/2021    3:14 PM 06/27/2021   10:59 PM 04/21/2020    1:49 PM 01/19/2016   10:34 AM 11/16/2015    1:15 PM 08/17/2014    3:09 PM 07/05/2012    1:15 PM  Advanced Directives  Does Patient Have a Medical Advance Directive? No No No No No No   Would patient like information on creating a medical advance directive? No - Patient declined  No - Patient declined   No - patient declined information   Pre-existing out of facility DNR order (yellow form or pink MOST form)       No    Current Medications (verified) Outpatient Encounter Medications as of 09/19/2021  Medication Sig   ALPRAZolam (XANAX) 1 MG tablet Take 0.5 mg in morning and 1 mg at night prn   BIOTIN FORTE PO Take 500 mg by mouth daily.   bisacodyl (DULCOLAX) 5  MG EC tablet Take 1 tablet (5 mg total) by mouth daily as needed for moderate constipation.   DULoxetine (CYMBALTA) 20 MG capsule TAKE 1 CAPSULE BY MOUTH TWICE A DAY   glimepiride (AMARYL) 2 MG tablet Take 2 mg by mouth every morning.   ibuprofen (ADVIL,MOTRIN) 200 MG tablet Take 400 mg by mouth every 6 (six) hours as needed for headache.   insulin detemir (LEVEMIR) 100 UNIT/ML FlexPen Inject 16 Units into the skin 2 (two) times daily.   Insulin Pen Needle 31G X 5 MM MISC Use with Levemir pen   LANTUS SOLOSTAR 100 UNIT/ML Solostar Pen SMARTSIG:16 Unit(s) SUB-Q Twice Daily   lisinopril (ZESTRIL) 10 MG tablet Take 1 tablet by mouth daily.   lovastatin (MEVACOR) 20 MG tablet TAKE 1 TABLET BY MOUTH EVERY DAY   NOVOLOG FLEXPEN 100 UNIT/ML FlexPen TAKE AS DIRECTED PER SLIDING SCALE. MAX DAILY DOSE: 50 UNITS/DAY.   omeprazole (PRILOSEC) 20 MG capsule Take 1 capsule (20 mg total) by mouth daily. Please schedule an yearly office visit for further refills. Thank you   ONETOUCH VERIO test strip 1 each by Other route 2 (two) times daily.    Polyethyl Glycol-Propyl Glycol (SYSTANE FREE OP) Apply to eye.   glimepiride (AMARYL) 1 MG tablet Take 1 mg by mouth daily with breakfast. (Patient not taking: Reported on 09/19/2021)   [DISCONTINUED] Pitavastatin Calcium 1 MG TABS Take 1 tablet (1  mg total) by mouth daily.   No facility-administered encounter medications on file as of 09/19/2021.    Allergies (verified) Metformin, Ciprofloxacin, Crestor [rosuvastatin calcium], Hydrochlorothiazide, Invokana [canagliflozin], Paroxetine, Penicillins, and Simvastatin   History: Past Medical History:  Diagnosis Date   Allergy    Anxiety    Cataract    starting, no surgery yet   Cholelithiasis 2011   Depression    Diabetes mellitus without complication (HCC)    DKA (diabetic ketoacidoses)    several years ago and in hospital x 1 week    GERD (gastroesophageal reflux disease)    Glucose intolerance (impaired  glucose tolerance)    HTN (hypertension)    Hyperlipidemia    IBS (irritable bowel syndrome)    PONV (postoperative nausea and vomiting)    Past Surgical History:  Procedure Laterality Date   ABDOMINAL HYSTERECTOMY     CHOLECYSTECTOMY N/A 07/03/2012   Procedure: LAPAROSCOPIC CHOLECYSTECTOMY WITH INTRAOPERATIVE CHOLANGIOGRAM;  Surgeon: Zenovia Jarred, MD;  Location: [facility] OR;  Service: General;  Laterality: N/A;   COLONOSCOPY     cyst removed     x3   OOPHORECTOMY     POLYPECTOMY     Family History  Problem Relation Age of Onset   Diabetes Mother    Diabetes Sister    Diabetes Brother    Diabetes Other    Stomach cancer Paternal Uncle    Heart disease Neg Hx    Colon cancer Neg Hx    Esophageal cancer Neg Hx    Rectal cancer Neg Hx    Colon polyps Neg Hx    Social History   Socioeconomic History   Marital status: Married    Spouse name: Not on file   Number of children: 1   Years of education: Not on file   Highest education level: Not on file  Occupational History   Occupation: [employer]    Comment: Wks w/husband  Tobacco Use   Smoking status: Never   Smokeless tobacco: Never  Vaping Use   Vaping Use: Never used  Substance and Sexual Activity   Alcohol use: Yes    Comment: very little   Drug use: No   Sexual activity: Yes  Other Topics Concern   Not on file  Social History Narrative   Not on file   Social Determinants of Health   Financial Resource Strain: Low Risk  (09/19/2021)   Overall Financial Resource Strain (CARDIA)    Difficulty of Paying Living Expenses: Not hard at all  Food Insecurity: No Food Insecurity (09/19/2021)   Hunger Vital Sign    Worried About Running Out of Food in the Last Year: Never true    Ran Out of Food in the Last Year: Never true  Transportation Needs: No Transportation Needs (09/19/2021)   PRAPARE - Hydrologist (Medical): No    Lack of Transportation (Non-Medical): No  Physical Activity:  Insufficiently Active (09/19/2021)   Exercise Vital Sign    Days of Exercise per Week: 3 days    Minutes of Exercise per Session: 30 min  Stress: No Stress Concern Present (09/19/2021)   [place]    Feeling of Stress : Not at all  Social Connections: Moderately Isolated (09/19/2021)   Social Connection and Isolation Panel [NHANES]    Frequency of Communication with Friends and Family: Three times a week    Frequency of Social Gatherings with Friends and Family: Three times  a week    Attends Religious Services: Never    Active Member of Clubs or Organizations: No    Attends Music therapist: Never    Marital Status: Married    Tobacco Counseling Counseling given: Not Answered   Clinical Intake:  Pre-visit preparation completed: Yes  Pain : No/denies pain     Nutritional Risks: None Diabetes: Yes CBG done?: No Did pt. bring in CBG monitor from home?: No  How often do you need to have someone help you when you read instructions, pamphlets, or other written materials from your doctor or pharmacy?: 1 - Never What is the last grade level you completed in school?: College  Diabetic?yes Nutrition Risk Assessment:  Has the patient had any N/V/D within the last 2 months?  No  Does the patient have any non-healing wounds?  No  Has the patient had any unintentional weight loss or weight gain?  No   Diabetes:  Is the patient diabetic?  Yes  If diabetic, was a CBG obtained today?  No  Did the patient bring in their glucometer from home?  No  How often do you monitor your CBG's? Daily .   Financial Strains and Diabetes Management:  Are you having any financial strains with the device, your supplies or your medication? No .  Does the patient want to be seen by Chronic Care Management for management of their diabetes?  No  Would the patient like to be referred to a Nutritionist or for Diabetic  Management?  No   Diabetic Exams:  Diabetic Eye Exam: Completed 09/2021 Diabetic Foot Exam: Overdue, Pt has been advised about the importance in completing this exam. Pt is scheduled for diabetic foot exam on next office visit .   Interpreter Needed?: No  Information entered by :: L.Wilson,LPN   Activities of Daily Living    09/19/2021    3:20 PM  In your present state of health, do you have any difficulty performing the following activities:  Hearing? 0  Vision? 0  Difficulty concentrating or making decisions? 0  Walking or climbing stairs? 0  Dressing or bathing? 0  Doing errands, shopping? 0  Preparing Food and eating ? N  Using the Toilet? N  In the past six months, have you accidently leaked urine? N  Do you have problems with loss of bowel control? N  Managing your Medications? N  Managing your Finances? N  Housekeeping or managing your Housekeeping? N    Patient Care Team: Plotnikov, Evie Lacks, MD as PCP - General Louretta Shorten, MD as Obstetrician (Obstetrics and Gynecology) Amalia Greenhouse, MD as Referring Physician (Endocrinology) Lady Gary, [facility] as Consulting Physician (Obstetrics and Gynecology) Delice Bison, Darnelle Maffucci, [facility] (Inactive) (Pharmacist) Szabat, Darnelle Maffucci, [facility] (Inactive) as Pharmacist (Pharmacist) Knox Royalty, RN as Case Manager  Indicate any recent Medical Services you may have received from other than [facility] providers in the past year (date may be approximate).     Assessment:   This is a routine wellness examination for Catherine Mora.  Hearing/Vision screen Vision Screening - Comments:: Annual eye exams   Dietary issues and exercise activities discussed: Current Exercise Habits: Home exercise routine, Type of exercise: walking, Time (Minutes): 30, Frequency (Times/Week): 5, Weekly Exercise (Minutes/Week): 150, Intensity: Mild, Exercise limited by: None identified   Goals Addressed   None    Depression Screen    09/19/2021    3:15 PM  09/19/2021    3:13 PM 07/05/2021    1:15  PM 06/30/2021    4:28 PM 03/30/2021   11:28 AM 03/30/2021   11:27 AM 12/28/2020   11:33 AM  PHQ 2/9 Scores  PHQ - 2 Score 0 0 1 2 0 0 0  PHQ- 9 Score    9 0  0    Fall Risk    09/19/2021    3:15 PM 07/05/2021    1:15 PM 06/30/2021    4:28 PM 03/30/2021   11:27 AM 07/27/2020    2:45 PM  Fall Risk   Falls in the past year? 0 0 0  0  Comment  denies recent falls x last 12 months- not currently using assistive devices     Number falls in past yr: 0 0 0 0 0  Injury with Fall? 0 0 0 0 0  Comment  N/A- no falls reported x 12 months     Risk for fall due to :  Medication side effect No Fall Risks  No Fall Risks  Follow up Falls evaluation completed;Education provided Falls prevention discussed Falls evaluation completed      FALL RISK PREVENTION PERTAINING TO THE HOME:  Any stairs in or around the home? Yes  If so, are there any without handrails? Yes  Home free of loose throw rugs in walkways, pet beds, electrical cords, etc? Yes  Adequate lighting in your home to reduce risk of falls? Yes   ASSISTIVE DEVICES UTILIZED TO PREVENT FALLS:  Life alert? No  Use of a cane, walker or w/c? No  Grab bars in the bathroom? No  Shower chair or bench in shower? No  Elevated toilet seat or a handicapped toilet? No     Cognitive Function:  Normal cognitive status assessed by telephone conversation  by this Nurse Health Advisor. No abnormalities found.        09/19/2021    3:21 PM  6CIT Screen  What Year? 0 points  What month? 0 points  What time? 0 points  Count back from 20 0 points  Months in reverse 0 points  Repeat phrase 0 points  Total Score 0 points    Immunizations Immunization History  Administered Date(s) Administered   Fluad Quad(high Dose 65+) 03/09/2020, 12/28/2020   Influenza, High Dose Seasonal PF 12/24/2017, 10/14/2018   PNEUMOCOCCAL CONJUGATE-20 12/28/2020   Pneumococcal Conjugate-13 09/28/2015   Pneumococcal Polysaccharide-23  10/03/2016   Tdap 10/10/2012    TDAP status: Up to date  Flu Vaccine status: Up to date  Pneumococcal vaccine status: Up to date  Covid-19 vaccine status: Completed vaccines  Qualifies for Shingles Vaccine? Yes   Zostavax completed No   Shingrix Completed?: No.    Education has been provided regarding the importance of this vaccine. Patient has been advised to call insurance company to determine out of pocket expense if they have not yet received this vaccine. Advised may also receive vaccine at local pharmacy or Health Dept. Verbalized acceptance and understanding.  Screening Tests Health Maintenance  Topic Date Due   FOOT EXAM  Never done   OPHTHALMOLOGY EXAM  Never done   Zoster Vaccines- Shingrix (1 of 2) Never done   DEXA SCAN  Never done   MAMMOGRAM  11/13/2020   INFLUENZA VACCINE  09/20/2021   HEMOGLOBIN A1C  09/25/2021   Diabetic kidney evaluation - GFR measurement  03/28/2022   Diabetic kidney evaluation - Urine ACR  03/28/2022   TETANUS/TDAP  10/11/2022   COLONOSCOPY (Pts 45-13yr Insurance coverage will need to be confirmed)  03/26/2026  Pneumonia Vaccine 33+ Years old  Completed   Hepatitis C Screening  Completed   HPV VACCINES  Aged Out   COVID-19 Vaccine  Discontinued    Health Maintenance  Health Maintenance Due  Topic Date Due   FOOT EXAM  Never done   OPHTHALMOLOGY EXAM  Never done   Zoster Vaccines- Shingrix (1 of 2) Never done   DEXA SCAN  Never done   MAMMOGRAM  11/13/2020    Colorectal cancer screening: Type of screening: Colonoscopy. Completed 03/27/2019. Repeat every 7 years  Mammogram status: Ordered 09/19/2021. Pt provided with contact info and advised to call to schedule appt.   Bone Density status: Ordered 09/19/2021. Pt provided with contact info and advised to call to schedule appt.  Lung Cancer Screening: (Low Dose CT Chest recommended if Age 75-80 years, 30 pack-year currently smoking OR have quit w/in 15years.) does not qualify.    Lung Cancer Screening Referral: n/a  Additional Screening:  Hepatitis C Screening: does not qualify;   Vision Screening: Recommended annual ophthalmology exams for early detection of glaucoma and other disorders of the eye. Is the patient up to date with their annual eye exam?  Yes  Who is the provider or what is the name of the office in which the patient attends annual eye exams? [facility]  If pt is not established with a provider, would they like to be referred to a provider to establish care? No .   Dental Screening: Recommended annual dental exams for proper oral hygiene  Community Resource Referral / Chronic Care Management: CRR required this visit?  No   CCM required this visit?  No      Plan:     I have personally reviewed and noted the following in the patient's chart:   Medical and social history Use of alcohol, tobacco or illicit drugs  Current medications and supplements including opioid prescriptions.  Functional ability and status Nutritional status Physical activity Advanced directives List of other physicians Hospitalizations, surgeries, and ER visits in previous 12 months Vitals Screenings to include cognitive, depression, and falls Referrals and appointments  In addition, I have reviewed and discussed with patient certain preventive protocols, quality metrics, and best practice recommendations. A written personalized care plan for preventive services as well as general preventive health recommendations were provided to patient.     Daphane Shepherd, LPN   06/13/5359   Nurse Notes: none     Medical screening examination/treatment/procedure(s) were performed by non-physician practitioner and as supervising physician I was immediately available for consultation/collaboration.  I agree with above. Lew Dawes, MD

## 2021-09-22 DIAGNOSIS — Z794 Long term (current) use of insulin: Secondary | ICD-10-CM | POA: Diagnosis not present

## 2021-09-22 DIAGNOSIS — Z7984 Long term (current) use of oral hypoglycemic drugs: Secondary | ICD-10-CM | POA: Diagnosis not present

## 2021-09-22 DIAGNOSIS — E119 Type 2 diabetes mellitus without complications: Secondary | ICD-10-CM | POA: Diagnosis not present

## 2021-09-28 ENCOUNTER — Encounter: Payer: Self-pay | Admitting: Internal Medicine

## 2021-09-28 ENCOUNTER — Ambulatory Visit (INDEPENDENT_AMBULATORY_CARE_PROVIDER_SITE_OTHER): Payer: HMO | Admitting: Internal Medicine

## 2021-09-28 VITALS — BP 116/82 | HR 74 | Temp 97.9°F | Ht 70.0 in | Wt 186.6 lb

## 2021-09-28 DIAGNOSIS — Z0000 Encounter for general adult medical examination without abnormal findings: Secondary | ICD-10-CM | POA: Diagnosis not present

## 2021-09-28 DIAGNOSIS — Z794 Long term (current) use of insulin: Secondary | ICD-10-CM

## 2021-09-28 DIAGNOSIS — F4323 Adjustment disorder with mixed anxiety and depressed mood: Secondary | ICD-10-CM | POA: Diagnosis not present

## 2021-09-28 DIAGNOSIS — E119 Type 2 diabetes mellitus without complications: Secondary | ICD-10-CM | POA: Diagnosis not present

## 2021-09-28 DIAGNOSIS — F329 Major depressive disorder, single episode, unspecified: Secondary | ICD-10-CM

## 2021-09-28 DIAGNOSIS — I10 Essential (primary) hypertension: Secondary | ICD-10-CM

## 2021-09-28 MED ORDER — ALPRAZOLAM 1 MG PO TABS: ORAL_TABLET | ORAL | 1 refills | Status: DC

## 2021-09-28 MED ORDER — DULOXETINE HCL 20 MG PO CPEP
20.0000 mg | ORAL_CAPSULE | Freq: Two times a day (BID) | ORAL | 3 refills | Status: DC
Start: 2021-09-28 — End: 2022-09-27

## 2021-09-28 NOTE — Assessment & Plan Note (Addendum)
Cont on Cymbalta 20 mg bid

## 2021-09-28 NOTE — Assessment & Plan Note (Signed)
Cont on Lisinopril °

## 2021-09-28 NOTE — Progress Notes (Signed)
Subjective:  Patient ID: Catherine Mora, female    DOB: 11/11/1950  Age: 71 y.o. MRN: [id number]  CC: 6 month f/u (No concerns. She did mentioned that her A1C was at 8.2) and Health Maintenance (Eye exam has not been scheduled yet )   HPI Catherine Mora presents for anxiety, stress  Outpatient Medications Prior to Visit  Medication Sig Dispense Refill   BIOTIN FORTE PO Take 500 mg by mouth daily.     bisacodyl (DULCOLAX) 5 MG EC tablet Take 1 tablet (5 mg total) by mouth daily as needed for moderate constipation. 30 tablet 3   glimepiride (AMARYL) 2 MG tablet Take 2 mg by mouth every morning.     ibuprofen (ADVIL,MOTRIN) 200 MG tablet Take 400 mg by mouth every 6 (six) hours as needed for headache.     insulin detemir (LEVEMIR) 100 UNIT/ML FlexPen Inject 16 Units into the skin 2 (two) times daily.     Insulin Pen Needle 31G X 5 MM MISC Use with Levemir pen 100 each 0   lisinopril (ZESTRIL) 10 MG tablet Take 1 tablet by mouth daily.     lovastatin (MEVACOR) 20 MG tablet TAKE 1 TABLET BY MOUTH EVERY DAY 90 tablet 1   NOVOLOG FLEXPEN 100 UNIT/ML FlexPen TAKE AS DIRECTED PER SLIDING SCALE. MAX DAILY DOSE: 50 UNITS/DAY.  3   omeprazole (PRILOSEC) 20 MG capsule Take 1 capsule (20 mg total) by mouth daily. Please schedule an yearly office visit for further refills. Thank you 90 capsule 0   ONETOUCH VERIO test strip 1 each by Other route 2 (two) times daily.      Polyethyl Glycol-Propyl Glycol (SYSTANE FREE OP) Apply to eye.     ALPRAZolam (XANAX) 1 MG tablet Take 0.5 mg in morning and 1 mg at night prn 135 tablet 0   DULoxetine (CYMBALTA) 20 MG capsule TAKE 1 CAPSULE BY MOUTH TWICE A DAY 180 capsule 2   glimepiride (AMARYL) 1 MG tablet Take 1 mg by mouth daily with breakfast. (Patient not taking: Reported on 09/19/2021)     LANTUS SOLOSTAR 100 UNIT/ML Solostar Pen SMARTSIG:16 Unit(s) SUB-Q Twice Daily     No facility-administered medications prior to visit.    ROS: Review of Systems   Constitutional:  Negative for activity change, appetite change, chills, fatigue and unexpected weight change.  HENT:  Negative for congestion, mouth sores and sinus pressure.   Eyes:  Negative for visual disturbance.  Respiratory:  Negative for cough and chest tightness.   Gastrointestinal:  Negative for abdominal pain and nausea.  Genitourinary:  Negative for difficulty urinating, frequency and vaginal pain.  Musculoskeletal:  Negative for back pain and gait problem.  Skin:  Negative for pallor and rash.  Neurological:  Negative for dizziness, tremors, weakness, numbness and headaches.  Psychiatric/Behavioral:  Negative for confusion, sleep disturbance and suicidal ideas. The patient is nervous/anxious.     Objective:  BP 116/82   Pulse 74   Temp 97.9 F (36.6 C) (Oral)   Ht '5\' 10"'$  (1.778 m)   Wt 186 lb 9.6 oz (84.6 kg)   SpO2 96%   BMI 26.77 kg/m   BP Readings from Last 3 Encounters:  09/28/21 116/82  06/30/21 126/64  06/28/21 (!) 148/79    Wt Readings from Last 3 Encounters:  09/28/21 186 lb 9.6 oz (84.6 kg)  06/30/21 189 lb 6.4 oz (85.9 kg)  06/27/21 190 lb (86.2 kg)    Physical Exam Constitutional:  General: She is not in acute distress.    Appearance: Normal appearance. She is well-developed.  HENT:     Head: Normocephalic.     Right Ear: External ear normal.     Left Ear: External ear normal.     Nose: Nose normal.  Eyes:     General:        Right eye: No discharge.        Left eye: No discharge.     Conjunctiva/sclera: Conjunctivae normal.     Pupils: Pupils are equal, round, and reactive to light.  Neck:     Thyroid: No thyromegaly.     Vascular: No JVD.     Trachea: No tracheal deviation.  Cardiovascular:     Rate and Rhythm: Normal rate and regular rhythm.     Heart sounds: Normal heart sounds.  Pulmonary:     Effort: No respiratory distress.     Breath sounds: No stridor. No wheezing.  Abdominal:     General: Bowel sounds are normal.  There is no distension.     Palpations: Abdomen is soft. There is no mass.     Tenderness: There is no abdominal tenderness. There is no guarding or rebound.  Musculoskeletal:        General: No tenderness.     Cervical back: Normal range of motion and neck supple. No rigidity.  Lymphadenopathy:     Cervical: No cervical adenopathy.  Skin:    Findings: No erythema or rash.  Neurological:     Cranial Nerves: No cranial nerve deficit.     Motor: No abnormal muscle tone.     Coordination: Coordination normal.     Deep Tendon Reflexes: Reflexes normal.  Psychiatric:        Behavior: Behavior normal.        Thought Content: Thought content normal.        Judgment: Judgment normal.     Lab Results  Component Value Date   WBC 6.3 03/28/2021   HGB 12.5 03/28/2021   HCT 38.4 03/28/2021   PLT 285.0 03/28/2021   GLUCOSE 119 (H) 03/28/2021   CHOL 266 (H) 03/04/2019   TRIG 149.0 03/04/2019   HDL 49.80 03/04/2019   LDLDIRECT 219.0 08/14/2014   LDLCALC 186 (H) 03/04/2019   ALT 16 03/28/2021   AST 19 03/28/2021   NA 141 03/28/2021   K 4.8 03/28/2021   CL 104 03/28/2021   CREATININE 1.01 03/28/2021   BUN 24 (H) 03/28/2021   CO2 32 03/28/2021   TSH 2.83 03/28/2021   HGBA1C 6.9 (H) 03/28/2021   MICROALBUR 1.9 03/28/2021    No results found.  Assessment & Plan:   Problem List Items Addressed This Visit     Adjustment disorder with mixed anxiety and depressed mood     Potential benefits of a long term benzodiazepines  use as well as potential risks  and complications were explained to the patient and were aknowledged. Cont on Xanax prn, Cymbalta 20 mg bid      Depression    Cont on Cymbalta 20 mg bid      Relevant Medications   ALPRAZolam (XANAX) 1 MG tablet   DULoxetine (CYMBALTA) 20 MG capsule   Hypertensive disorder    Cont on Lisinopril      Type 2 diabetes mellitus without complication (HCC)    Cont on Levimir and Novolog prn, Amaryl      Relevant Orders    Comprehensive metabolic panel   Well adult exam -  Primary   Relevant Orders   TSH   Urinalysis   CBC with Differential/Platelet   Lipid panel   Comprehensive metabolic panel      Meds ordered this encounter  Medications   ALPRAZolam (XANAX) 1 MG tablet    Sig: Take 0.5 mg in morning and 1 mg at night prn    Dispense:  135 tablet    Refill:  1   DULoxetine (CYMBALTA) 20 MG capsule    Sig: Take 1 capsule (20 mg total) by mouth 2 (two) times daily.    Dispense:  180 capsule    Refill:  3      Follow-up: Return in about 6 months (around 03/31/2022) for Wellness Exam.  Walker Kehr, MD

## 2021-09-28 NOTE — Assessment & Plan Note (Addendum)
Potential benefits of a long term benzodiazepines  use as well as potential risks  and complications were explained to the patient and were aknowledged. Cont on Xanax prn, Cymbalta 20 mg bid

## 2021-09-28 NOTE — Assessment & Plan Note (Signed)
Cont on Levimir and Novolog prn, Amaryl

## 2021-10-03 ENCOUNTER — Encounter: Payer: Self-pay | Admitting: *Deleted

## 2021-10-03 ENCOUNTER — Telehealth: Payer: PPO

## 2021-10-03 ENCOUNTER — Telehealth: Payer: Self-pay | Admitting: *Deleted

## 2021-10-03 NOTE — Telephone Encounter (Signed)
  Care Management   Follow Up Note   10/03/2021 Name: Catherine Mora MRN: [id number] DOB: 14-May-1950  Referred by: Cassandria Anger, MD Reason for referral : Care Coordination (Clinic RN CM Follow Up Telephone Visit; HTN; DMII; Unsuccessful Outreach)  An unsuccessful telephone outreach was attempted today. The patient was referred to the case management team for assistance with care management and care coordination.   Follow Up Plan:  A HIPPA compliant phone message was left for the patient providing contact information and requesting a return call Will place another telephone outreach to patient within 7 business days for follow up if I do not hear back from patient by end of day  [name], RN, BSN, [place] [phone]: direct office

## 2021-10-06 ENCOUNTER — Encounter: Payer: Self-pay | Admitting: *Deleted

## 2021-10-06 ENCOUNTER — Telehealth: Payer: Self-pay | Admitting: *Deleted

## 2021-10-06 NOTE — Telephone Encounter (Signed)
  Care Management   Follow Up Note   10/06/2021 Name: Catherine Mora MRN: [id number] DOB: [birth year]  Referred by: Cassandria Anger, MD Reason for referral : Care Coordination (Clinic RN CM Follow Up Telephone outreach attempt # 2- unsuccessful)  A second unsuccessful telephone outreach was attempted today. The patient was referred to the case management team for assistance with care management and care coordination.   Follow Up Plan:  A HIPPA compliant phone message was left for the patient providing contact information and requesting a return call Will place third/ final telephone outreach attempt to contact patient for RN CM follow up within 7 business days if I not hear back from patient by end of day  [name], RN, BSN, [place] [phone]: direct office

## 2021-10-11 ENCOUNTER — Encounter: Payer: Self-pay | Admitting: *Deleted

## 2021-10-11 ENCOUNTER — Ambulatory Visit: Payer: Self-pay | Admitting: *Deleted

## 2021-10-11 DIAGNOSIS — I10 Essential (primary) hypertension: Secondary | ICD-10-CM

## 2021-10-11 DIAGNOSIS — E119 Type 2 diabetes mellitus without complications: Secondary | ICD-10-CM

## 2021-10-11 NOTE — Chronic Care Management (AMB) (Signed)
Care Management    RN Visit Note  10/11/2021 Name: Catherine Mora MRN: [id number] DOB: [birth year]  Subjective: Catherine Mora is a 71 y.o. year old female who is a primary care patient of Plotnikov, Evie Lacks, MD. The care management team was consulted for assistance with disease management and care coordination needs.    Engaged with patient by telephone for follow up visit in response to provider referral for case management and/or care coordination services.   Consent to Services:   [name] was given information about Care Management services 07/02/20 including:  Care Management services includes personalized support from designated clinical staff supervised by her physician, including individualized plan of care and coordination with other care providers 24/7 contact phone numbers for assistance for urgent and routine care needs. The patient may stop case management services at any time by phone call to the office staff.  Patient agreed to services and consent obtained.   Assessment: Review of patient past medical history, allergies, medications, health status, including review of consultants reports, laboratory and other test data, was performed as part of comprehensive evaluation and provision of chronic care management services.   SDOH (Social Determinants of Health) assessments and interventions performed:  SDOH Interventions    Flowsheet Row Most Recent Value  SDOH Interventions   Food Insecurity Interventions Intervention Not Indicated  Transportation Interventions Intervention Not Indicated  [continues to drive self]     Care Plan  Allergies  Allergen Reactions   Metformin Other (See Comments)    Hair loss   Ciprofloxacin Hives    REACTION: itching   Crestor [Rosuvastatin Calcium] Other (See Comments)    arthralgia   Hydrochlorothiazide Other (See Comments)    REACTION: hair loss   Invokana [Canagliflozin] Other (See Comments)    ketoacidosis    Paroxetine Other (See Comments)    REACTION: living in glass   Penicillins Hives   Simvastatin Other (See Comments)    REACTION: aches with statins   Outpatient Encounter Medications as of 10/11/2021  Medication Sig Note   ALPRAZolam (XANAX) 1 MG tablet Take 0.5 mg in morning and 1 mg at night prn    BIOTIN FORTE PO Take 500 mg by mouth daily.    bisacodyl (DULCOLAX) 5 MG EC tablet Take 1 tablet (5 mg total) by mouth daily as needed for moderate constipation.    DULoxetine (CYMBALTA) 20 MG capsule Take 1 capsule (20 mg total) by mouth 2 (two) times daily.    glimepiride (AMARYL) 2 MG tablet Take 2 mg by mouth every morning.    ibuprofen (ADVIL,MOTRIN) 200 MG tablet Take 400 mg by mouth every 6 (six) hours as needed for headache.    insulin detemir (LEVEMIR) 100 UNIT/ML FlexPen Inject 16 Units into the skin 2 (two) times daily.    Insulin Pen Needle 31G X 5 MM MISC Use with Levemir pen    lisinopril (ZESTRIL) 10 MG tablet Take 1 tablet by mouth daily.    lovastatin (MEVACOR) 20 MG tablet TAKE 1 TABLET BY MOUTH EVERY DAY    NOVOLOG FLEXPEN 100 UNIT/ML FlexPen TAKE AS DIRECTED PER SLIDING SCALE. MAX DAILY DOSE: 50 UNITS/DAY.    omeprazole (PRILOSEC) 20 MG capsule Take 1 capsule (20 mg total) by mouth daily. Please schedule an yearly office visit for further refills. Thank you    ONETOUCH VERIO test strip 1 each by Other route 2 (two) times daily.  08/27/2014: Received from: External Pharmacy Received Sig:    Polyethyl Glycol-Propyl  Glycol (SYSTANE FREE OP) Apply to eye.    [DISCONTINUED] Pitavastatin Calcium 1 MG TABS Take 1 tablet (1 mg total) by mouth daily.    No facility-administered encounter medications on file as of 10/11/2021.   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) 08/18/2014   Dehydration 08/17/2014   Nausea with vomiting 08/17/2014   DKA (diabetic ketoacidoses) 08/17/2014   AKI (acute kidney injury) ([place]) 08/17/2014   Polydipsia 08/13/2014   Type 2 diabetes mellitus without complication ([place]) 43/32/9518   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   URI, acute 12/03/2012   Urticaria 12/03/2012   Cervical adenopathy 12/03/2012   Hypertensive disorder 10/03/2011   Sleep disturbance 10/03/2011   Well adult exam 05/24/2010   CHOLELITHIASIS 05/26/2009   HAIR LOSS 03/12/2009   DIZZINESS 06/30/2008   GLUCOSE INTOLERANCE 05/08/2007   Adjustment disorder with mixed anxiety and depressed mood 05/08/2007   Esophageal stricture 05/08/2007   IBS 05/08/2007   FATIGUE 05/08/2007   Chest pain with moderate risk for cardiac etiology 05/08/2007   ABNORMAL GLUCOSE NEC 09/15/2006   Conditions to be addressed/monitored: HTN and DMII  Care Plan : RN Care Manager Plan of Care  Updates made by Knox Royalty, RN since 10/11/2021 12:00 AM     Problem: Chronic Disease Management Needs      Long-Range Goal: Development of plan of care for long term chronic disease management   Start Date: 07/05/2021  Expected End Date: 07/06/2022  Priority: High  Note:   Current Barriers:  Chronic Disease Management support and education needs related to HTN and DMII  RNCM Clinical Goal(s):  Patient will demonstrate ongoing health management independence as evidenced by adherence to plan of care for DMII; HTN        through collaboration with RN Care manager, provider, and care team.   Interventions: 1:1 collaboration with primary care provider regarding  development and update of comprehensive plan of care as evidenced by provider attestation and co-signature Inter-disciplinary care team collaboration (see longitudinal plan of care) Evaluation of current treatment plan related to  self management and patient's adherence to plan as established by provider CCM RN CM Initial assessment completed 07/05/21 Review of patient status, including review of consultants reports, relevant laboratory and other test results, and medications completed SDOH assessment updated: no unmet concerns identified Pain assessment completed: denies pain Falls assessment updated: denies recent falls x 12 months- currently does not use assistive devices;  positive reinforcement provided with encouragement to continue efforts at fall prevention; education/ discussion around fall risks/ prevention provided Medications discussed: reports independently self-manages all aspects of medication administration; uses pill box; denies current concerns/ issues/ questions around medications; endorses adherence to taking all medications as prescribed; reviewed recent changes made to dosing of glimeperide at PCP office visit 09/28/21- patient verbalizes good understanding of same and confirms she is taking increased dose of glimepiride as instructed; she confirms she took antibiotic as prescribed in May and has no further concerns/ issues around dizziness/  inner ear symptoms Reviewed upcoming scheduled provider appointments: 10/27/21- mammogram/ dexa bone density; patient confirms is aware of all and has plans to attend as scheduled Discussed plans with patient for ongoing care management follow up- patient denies current care coordination/ care management needs and declines ongoing RN CM outreach; she is agreeable to RN CM case closure today; verbalizes understanding to contact PCP or other care providers for any needs that arise in the future, and confirms he has contact information for all care  providers     Diabetes:  (Status: 10/11/21: Goal Met. Patient declined further engagement on this goal.) Long Term Goal   Lab Results  Component Value Date   HGBA1C 6.9 (H) 03/28/2021    Provided education to patient about basic DM disease process; Counseled on importance of regular laboratory monitoring as prescribed;        Confirmed patient has not been consistently monitoring fasting blood sugars at home recently-- she has been on vacation; declines review of blood sugars at home- states, "I am nnot hardly monitoring much at all, I have been too busy;" she was encouraged to resume blood sugar monitoring at home, she states she will consider Confirmed patient aware of signs/ symptoms low blood sugar (has had in past, not recently) along with corresponding action plan; denies recent signs/ symptoms hypoglycemia Confirmed patient verbalizes good understanding of prescribed diet and endorses adherence to diet Confirmed patient has good ongoing understanding of medications for DMII- she administers her own insulin QD and continues using sliding scale "rarely;" again reports has not needed sliding scale insulin "in quite a long time;" I encouraged her to not use without monitoring blood sugars at home, prior to administering  Hypertension: (Status: 10/11/21 Goal Met. Patient declined further engagement on this goal.) Long Term Goal  Last practice recorded BP readings:  BP Readings from Last 3 Encounters:  09/28/21 116/82  06/30/21 126/64  06/28/21 (!) 148/79  Most recent eGFR/CrCl: No results found for: EGFR  No components found for: CRCL  Evaluation of current treatment plan related to hypertension self management and patient's adherence to plan as established by provider;   Reviewed prescribed diet heart healthy, low cholesterol, low salt, carbohydrate modified, low sugar Counseled on the importance of exercise goals with target of 150 minutes per week Discussed complications of poorly  controlled blood pressure such as heart disease, stroke, circulatory complications, vision complications, kidney impairment, sexual dysfunction;  Confirms patient does not currently monitor blood pressures at home Confirmed patient is active at baseline: she walks daily; positive reinforcement provided with encouragement to continue efforts     Plan:  No further follow up required: patient denies current care coordination/ care management needs and declines ongoing RN CM follow up; and is agreeable to RN CM case closure today; RN CM case closure accordingly  [name], RN, BSN, [place] [phone]: direct office

## 2021-10-13 ENCOUNTER — Other Ambulatory Visit: Payer: Self-pay | Admitting: Gastroenterology

## 2021-10-19 ENCOUNTER — Other Ambulatory Visit: Payer: Self-pay | Admitting: Internal Medicine

## 2021-10-27 ENCOUNTER — Inpatient Hospital Stay: Admission: RE | Admit: 2021-10-27 | Payer: PPO | Source: Ambulatory Visit

## 2021-11-06 ENCOUNTER — Telehealth: Payer: Self-pay | Admitting: Family

## 2021-11-06 ENCOUNTER — Telehealth: Payer: Medicare PPO | Admitting: Family

## 2021-11-06 DIAGNOSIS — R5383 Other fatigue: Secondary | ICD-10-CM

## 2021-11-06 DIAGNOSIS — I499 Cardiac arrhythmia, unspecified: Secondary | ICD-10-CM

## 2021-11-06 NOTE — Progress Notes (Signed)
Distant Site Telemedicine Encounter  I conducted this encounter via secure, live, face-to-face video conference with the patient. I reviewed the risks and benefits of telemedicine as pertinent to this visit and the patient agreed to proceed.    Provider Location: Off-site location (home, non-[place] location)  Patient Location: At home  Present with patient: No one else present     Confirmed patient name and DOB.     Chief Complaint   Patient presents with    Irregular Heart Beat        Subjective:     Paula Bowman is a 71 year old female who presents on 11/06/2021.    Paula Bowman is here with concerns for her smart watch notices that indicate she has had an irregular heart beat. She notes this has happened a few times. She is wondering how concerned she should be about this. The smart watch has been consistently telling her that her heart rate is irregular. She tried it on her wife and her wife has a regular heart rate. She has done it 3 times but each time it is telling her she has an irregular heart rate. This has been consistent for the past 2 days. She notes she has been feeling "off" for over a year. She has little energy, wakes up in the morning and doesn't feel good in the morning. She is doing a treadmill daily for about 2 miles and she reports being tired and napping daily for about 40 minutes. She has been in to the [facility] clinic and had a physical but has not had an EKG. She notes the physical indicates no concern.         Objective:    Vitals: There were no vitals taken for this visit.  General: well appearing, no acute distress  HEENT: normocephalic/atraumatic, EOMI, sclerae clear  Pulmonary: speaking in full sentences  CV: well perfused  Psych: mood normal, behavior normal  Neuro: alert and oriented x 3, normal speech  MSK: moving all extremities grossly         Assessment and Plan:    Chika was seen today for irregular heart beat.    Irregular heart rate  -     EKG 12-Lead; Future  -     Patch Monitor - 14  Day; Future    Fatigue, unspecified type  -     EKG 12-Lead; Future  -     Patch Monitor - 14 Day; Future      Ordered EKG to be done at [facility].  Ordered 14 day patch monitor from [facility].   Advised Paula Bowman to start baby aspirin again.

## 2021-11-06 NOTE — Patient Instructions (Addendum)
How to get EKG done?   An EKG test has been ordered for you. Our staff will reach out to help you schedule a lab appointment at one of our primary care clinics. For urgent labs, we will reach out to you within 2 hours, for all other labs we will contact you within 24 hours (M-F) to assist you in scheduling.    You may also call us to schedule at [phone].    If you live outside of [facility] or cannot easily get to one of our  locations, please let us know where else you would like to complete these tests.      How to connect with the virtual care team:   If you need to reach your Virtual Primary Care Team, please login to MyChart and send [place] a message. For more timely needs, please call us at [phone]. Our team is available to assist you from 8:00 am to 6:00 pm Monday through Friday.

## 2021-11-06 NOTE — Telephone Encounter (Signed)
Please call [facility] clinic on Monday to arrange for EKG test which has been ordered for patient for irregular heart rate.     Thank you!

## 2021-11-07 ENCOUNTER — Telehealth (INDEPENDENT_AMBULATORY_CARE_PROVIDER_SITE_OTHER): Payer: Self-pay | Admitting: Internal Medicine

## 2021-11-07 ENCOUNTER — Ambulatory Visit (INDEPENDENT_AMBULATORY_CARE_PROVIDER_SITE_OTHER): Payer: Medicare PPO

## 2021-11-07 DIAGNOSIS — R5383 Other fatigue: Secondary | ICD-10-CM

## 2021-11-07 DIAGNOSIS — I499 Cardiac arrhythmia, unspecified: Secondary | ICD-10-CM

## 2021-11-07 NOTE — Telephone Encounter (Signed)
Patient was seen yesterday at the [facility] , she was seen with irregular heart beats for few months also fatigue   EKG done showed NSR with PACs  She was referred to have Holter   Recommend : follow up with me and get her Holter done   Appointment will be scheduled

## 2021-11-07 NOTE — Telephone Encounter (Signed)
Pt notified to contact the clinic as they can use hold slots that I cannot use. Pt will need to see a provider. Also notified about the hospitals doing walk in EKGs

## 2021-11-07 NOTE — Progress Notes (Signed)
EKG completed and reviewed by [facility]

## 2021-11-09 LAB — EKG 12 LEAD
Atrial Rate: 77 {beats}/min
P Axis: 58 degrees
P-R Interval: 130 ms
Q-T Interval: 376 ms
QRS Duration: 86 ms
QTC Calculation: 425 ms
R Axis: 60 degrees
T Axis: 69 degrees
Ventricular Rate: 77 {beats}/min

## 2021-11-10 ENCOUNTER — Ambulatory Visit
Admission: RE | Admit: 2021-11-10 | Discharge: 2021-11-10 | Disposition: A | Discharge status: Home / Self Care | Payer: Medicare PPO | Attending: Family | Admitting: Family

## 2021-11-10 DIAGNOSIS — I499 Cardiac arrhythmia, unspecified: Secondary | ICD-10-CM | POA: Insufficient documentation

## 2021-11-10 DIAGNOSIS — R5383 Other fatigue: Secondary | ICD-10-CM | POA: Insufficient documentation

## 2021-11-10 NOTE — Procedure Nursing Note (Signed)
14 DAY CAM MONITOR MAILED TO PATIENT, INSTRUCTIONS INCLUDED. MESSAGE SENT TO PATIENT VIA MYCHART. RETURN TRACKING # ENDING IN [phone]

## 2021-11-23 ENCOUNTER — Ambulatory Visit
Admission: EM | Admit: 2021-11-23 | Discharge: 2021-11-23 | Disposition: A | Discharge status: Home / Self Care | Payer: HMO | Attending: Urgent Care | Admitting: Urgent Care

## 2021-11-23 DIAGNOSIS — H00015 Hordeolum externum left lower eyelid: Secondary | ICD-10-CM

## 2021-11-23 DIAGNOSIS — Z794 Long term (current) use of insulin: Secondary | ICD-10-CM | POA: Diagnosis not present

## 2021-11-23 DIAGNOSIS — H5712 Ocular pain, left eye: Secondary | ICD-10-CM | POA: Diagnosis not present

## 2021-11-23 DIAGNOSIS — E119 Type 2 diabetes mellitus without complications: Secondary | ICD-10-CM

## 2021-11-23 MED ORDER — ERYTHROMYCIN 5 MG/GM OP OINT: TOPICAL_OINTMENT | Freq: Three times a day (TID) | OPHTHALMIC | 0 refills | Status: DC

## 2021-11-23 NOTE — ED Provider Notes (Signed)
[facility] - URGENT [facility]ARE [facility]  Note:  This document was prepared using Systems analyst and may include unintentional dictation errors.  MRN: [id number] DOB: 09/25/1950  Subjective:   Catherine Mora is a 71 y.o. female presenting for 3-day history of persistent left eye stye with pain.  Has been using warm compresses consistently.  No vision changes, eye involvement.  Has type 2 diabetes treated with insulin.  No current facility-administered medications for this encounter.  Current Outpatient Medications:    ALPRAZolam (XANAX) 1 MG tablet, Take 0.5 mg in morning and 1 mg at night prn, Disp: 135 tablet, Rfl: 1   BIOTIN FORTE PO, Take 500 mg by mouth daily., Disp: , Rfl:    bisacodyl (DULCOLAX) 5 MG EC tablet, Take 1 tablet (5 mg total) by mouth daily as needed for moderate constipation., Disp: 30 tablet, Rfl: 3   DULoxetine (CYMBALTA) 20 MG capsule, Take 1 capsule (20 mg total) by mouth 2 (two) times daily., Disp: 180 capsule, Rfl: 3   DULoxetine (CYMBALTA) 30 MG capsule, TAKE 1 CAPSULE BY MOUTH EVERY DAY, Disp: 90 capsule, Rfl: 1   glimepiride (AMARYL) 2 MG tablet, Take 2 mg by mouth every morning., Disp: , Rfl:    ibuprofen (ADVIL,MOTRIN) 200 MG tablet, Take 400 mg by mouth every 6 (six) hours as needed for headache., Disp: , Rfl:    insulin detemir (LEVEMIR) 100 UNIT/ML FlexPen, Inject 16 Units into the skin 2 (two) times daily., Disp: , Rfl:    Insulin Pen Needle 31G X 5 MM MISC, Use with Levemir pen, Disp: 100 each, Rfl: 0   lisinopril (ZESTRIL) 10 MG tablet, Take 1 tablet by mouth daily., Disp: , Rfl:    lovastatin (MEVACOR) 20 MG tablet, TAKE 1 TABLET BY MOUTH EVERY DAY, Disp: 90 tablet, Rfl: 1   NOVOLOG FLEXPEN 100 UNIT/ML FlexPen, TAKE AS DIRECTED PER SLIDING SCALE. MAX DAILY DOSE: 50 UNITS/DAY., Disp: , Rfl: 3   omeprazole (PRILOSEC) 20 MG capsule, Take 1 capsule (20 mg total) by mouth daily. Please schedule an yearly office visit for further refills.  Thank you, Disp: 90 capsule, Rfl: 0   ONETOUCH VERIO test strip, 1 each by Other route 2 (two) times daily. , Disp: , Rfl:    Polyethyl Glycol-Propyl Glycol (SYSTANE FREE OP), Apply to eye., Disp: , Rfl:    Allergies  Allergen Reactions   Metformin Other (See Comments)    Hair loss   Ciprofloxacin Hives    REACTION: itching   Crestor [Rosuvastatin Calcium] Other (See Comments)    arthralgia   Hydrochlorothiazide Other (See Comments)    REACTION: hair loss   Invokana [Canagliflozin] Other (See Comments)    ketoacidosis   Paroxetine Other (See Comments)    REACTION: living in glass   Penicillins Hives   Simvastatin Other (See Comments)    REACTION: aches with statins    Past Medical History:  Diagnosis Date   Allergy    Anxiety    Cataract    starting, no surgery yet   Cholelithiasis 2011   Depression    Diabetes mellitus without complication (HCC)    DKA (diabetic ketoacidoses)    several years ago and in hospital x 1 week    GERD (gastroesophageal reflux disease)    Glucose intolerance (impaired glucose tolerance)    HTN (hypertension)    Hyperlipidemia    IBS (irritable bowel syndrome)    PONV (postoperative nausea and vomiting)      Past Surgical  History:  Procedure Laterality Date   ABDOMINAL HYSTERECTOMY     CHOLECYSTECTOMY N/A 07/03/2012   Procedure: LAPAROSCOPIC CHOLECYSTECTOMY WITH INTRAOPERATIVE CHOLANGIOGRAM;  Surgeon: Zenovia Jarred, MD;  Location: [place];  Service: General;  Laterality: N/A;   COLONOSCOPY     cyst removed     x3   OOPHORECTOMY     POLYPECTOMY      Family History  Problem Relation Age of Onset   Diabetes Mother    Diabetes Sister    Diabetes Brother    Diabetes Other    Stomach cancer Paternal Uncle    Heart disease Neg Hx    Colon cancer Neg Hx    Esophageal cancer Neg Hx    Rectal cancer Neg Hx    Colon polyps Neg Hx     Social History   Tobacco Use   Smoking status: Never   Smokeless tobacco: Never  Vaping Use    Vaping Use: Never used  Substance Use Topics   Alcohol use: Yes    Comment: very little   Drug use: No    ROS   Objective:   Vitals: BP 116/79 (BP Location: Right Arm)   Pulse 81   Temp 98.1 F (36.7 C) (Oral)   Resp 16   SpO2 94%   Physical Exam Constitutional:      General: She is not in acute distress.    Appearance: Normal appearance. She is well-developed. She is not ill-appearing, toxic-appearing or diaphoretic.  HENT:     Head: Normocephalic and atraumatic.     Nose: Nose normal.     Mouth/Throat:     Mouth: Mucous membranes are moist.  Eyes:     General: Lids are normal. Lids are everted, no foreign bodies appreciated. Vision grossly intact. No scleral icterus.       Right eye: No foreign body, discharge or hordeolum.        Left eye: Hordeolum (over area outlined with tenderness; no signs of preseptal cellulitis) present.No foreign body or discharge.     Extraocular Movements: Extraocular movements intact.     Right eye: Normal extraocular motion.     Left eye: Normal extraocular motion and no nystagmus.     Conjunctiva/sclera:     Right eye: Right conjunctiva is not injected. No chemosis, exudate or hemorrhage.    Left eye: Left conjunctiva is not injected. No chemosis, exudate or hemorrhage.  Cardiovascular:     Rate and Rhythm: Normal rate.  Pulmonary:     Effort: Pulmonary effort is normal.  Skin:    General: Skin is warm and dry.  Neurological:     General: No focal deficit present.     Mental Status: She is alert and oriented to person, place, and time.  Psychiatric:        Mood and Affect: Mood normal.        Behavior: Behavior normal.     Assessment and Plan :   PDMP not reviewed this encounter.  1. Hordeolum externum of left lower eyelid   2. Left eye pain   3. Type 2 diabetes mellitus treated with insulin ([place])     Advised continued warm compresses.  Offered rapamycin ophthalmic ointment. Counseled patient on potential for adverse  effects with medications prescribed/recommended today, ER and return-to-clinic precautions discussed, patient verbalized understanding.    Jaynee Eagles, [place] 11/23/21 1437

## 2021-11-23 NOTE — ED Triage Notes (Signed)
Pt states sty to left eye for the past 3 days. State it is painful and not getting any better.

## 2021-12-08 ENCOUNTER — Other Ambulatory Visit: Payer: Self-pay

## 2021-12-10 ENCOUNTER — Encounter (HOSPITAL_BASED_OUTPATIENT_CLINIC_OR_DEPARTMENT_OTHER): Payer: Self-pay | Admitting: Emergency Medicine

## 2021-12-10 ENCOUNTER — Other Ambulatory Visit: Payer: Self-pay

## 2021-12-10 ENCOUNTER — Emergency Department (HOSPITAL_BASED_OUTPATIENT_CLINIC_OR_DEPARTMENT_OTHER)
Admission: EM | Admit: 2021-12-10 | Discharge: 2021-12-10 | Disposition: A | Discharge status: Home / Self Care | Payer: HMO | Attending: Emergency Medicine | Admitting: Emergency Medicine

## 2021-12-10 DIAGNOSIS — Z79899 Other long term (current) drug therapy: Secondary | ICD-10-CM | POA: Insufficient documentation

## 2021-12-10 DIAGNOSIS — Z794 Long term (current) use of insulin: Secondary | ICD-10-CM | POA: Diagnosis not present

## 2021-12-10 DIAGNOSIS — Z7984 Long term (current) use of oral hypoglycemic drugs: Secondary | ICD-10-CM | POA: Insufficient documentation

## 2021-12-10 DIAGNOSIS — I10 Essential (primary) hypertension: Secondary | ICD-10-CM | POA: Diagnosis not present

## 2021-12-10 DIAGNOSIS — H00012 Hordeolum externum right lower eyelid: Secondary | ICD-10-CM | POA: Insufficient documentation

## 2021-12-10 DIAGNOSIS — E119 Type 2 diabetes mellitus without complications: Secondary | ICD-10-CM | POA: Insufficient documentation

## 2021-12-10 DIAGNOSIS — H00015 Hordeolum externum left lower eyelid: Secondary | ICD-10-CM | POA: Insufficient documentation

## 2021-12-10 MED ORDER — ERYTHROMYCIN 5 MG/GM OP OINT: TOPICAL_OINTMENT | Freq: Three times a day (TID) | OPHTHALMIC | 0 refills | Status: DC

## 2021-12-10 NOTE — Discharge Instructions (Signed)
Continue to use the erythromycin ointment.  Both eyes.  Continue use the warm compresses.  Call Dr. Samul Dada office on Monday for follow-up.

## 2021-12-10 NOTE — ED Notes (Signed)
Pt discharged to home. Discharge instructions have been discussed with patient and/or family members. Pt verbally acknowledges understanding d/c instructions, and endorses comprehension to checkout at registration before leaving.  °

## 2021-12-10 NOTE — ED Provider Notes (Signed)
[facility]Y DEPARTMENT Provider Note   CSN: [id number] Arrival date & time: 12/10/21  1037     History  Chief Complaint  Patient presents with   Eye Problem    Catherine Mora is a 71 y.o. female.  Patient seen October 4 for stye to the left lower eyelid.  Patient now has 1 in the right lower eyelid.  Patient's been using erythromycin ointment.  Patient has an optometrist but cannot get an appointment until November.  The styes are not going away.  Patient does not wear contacts.  Past medical history significant for her having diabetes hyperlipidemia hypertension patient had her gallbladder removed in 2014 patient had abdominal hysterectomy in the past.  Patient is never used tobacco products.       Home Medications Prior to Admission medications   Medication Sig Start Date End Date Taking? Authorizing Provider  ALPRAZolam Duanne Moron) 1 MG tablet Take 0.5 mg in morning and 1 mg at night prn 09/28/21   Plotnikov, Evie Lacks, MD  BIOTIN FORTE PO Take 500 mg by mouth daily.    [provider]  bisacodyl (DULCOLAX) 5 MG EC tablet Take 1 tablet (5 mg total) by mouth daily as needed for moderate constipation. 10/03/16   Plotnikov, Evie Lacks, MD  DULoxetine (CYMBALTA) 20 MG capsule Take 1 capsule (20 mg total) by mouth 2 (two) times daily. 09/28/21   Plotnikov, Evie Lacks, MD  DULoxetine (CYMBALTA) 30 MG capsule TAKE 1 CAPSULE BY MOUTH EVERY DAY 10/19/21   Plotnikov, Evie Lacks, MD  erythromycin ophthalmic ointment Place into both eyes 3 (three) times daily. 12/10/21   Fredia Sorrow, MD  glimepiride (AMARYL) 2 MG tablet Take 2 mg by mouth every morning. 06/13/21   [provider]  ibuprofen (ADVIL,MOTRIN) 200 MG tablet Take 400 mg by mouth every 6 (six) hours as needed for headache.    [provider]  insulin detemir (LEVEMIR) 100 UNIT/ML FlexPen Inject 16 Units into the skin 2 (two) times daily.    [provider]  Insulin Pen Needle 31G  X 5 MM MISC Use with Levemir pen 08/22/14   Cristal Ford, DO  lisinopril (ZESTRIL) 10 MG tablet Take 1 tablet by mouth daily. 02/15/21   [provider]  lovastatin (MEVACOR) 20 MG tablet TAKE 1 TABLET BY MOUTH EVERY DAY 10/19/21   Plotnikov, Evie Lacks, MD  NOVOLOG FLEXPEN 100 UNIT/ML FlexPen TAKE AS DIRECTED PER SLIDING SCALE. MAX DAILY DOSE: 50 UNITS/DAY. 05/10/17   [provider]  omeprazole (PRILOSEC) 20 MG capsule Take 1 capsule (20 mg total) by mouth daily. Please schedule an yearly office visit for further refills. Thank you 06/27/21   Armbruster, [name], MD  [facility] VERIO test strip 1 each by Other route 2 (two) times daily.  08/14/14   [provider]  Polyethyl Glycol-Propyl Glycol (SYSTANE FREE OP) Apply to eye.    [provider]  Pitavastatin Calcium 1 MG TABS Take 1 tablet (1 mg total) by mouth daily. 03/24/19 03/26/19  Plotnikov, Evie Lacks, MD      Allergies    Metformin, Ciprofloxacin, Crestor [rosuvastatin calcium], Hydrochlorothiazide, Invokana [canagliflozin], Paroxetine, Penicillins, and Simvastatin    Review of Systems   Review of Systems  Constitutional:  Negative for chills and fever.  HENT:  Negative for rhinorrhea and sore throat.   Eyes:  Positive for pain and redness. Negative for visual disturbance.  Respiratory:  Negative for cough and shortness of breath.   Cardiovascular:  Negative for chest pain and leg swelling.  Gastrointestinal:  Negative for abdominal pain, diarrhea, nausea and vomiting.  Genitourinary:  Negative for dysuria.  Musculoskeletal:  Negative for back pain and neck pain.  Skin:  Negative for rash.  Neurological:  Negative for dizziness, light-headedness and headaches.  Hematological:  Does not bruise/bleed easily.  Psychiatric/Behavioral:  Negative for confusion.     Physical Exam Updated Vital Signs BP 125/88 (BP Location: Left Arm)   Pulse 80   Temp 98.6 F (37 C) (Oral)   Resp 18   Ht 1.778 m (5'  10")   Wt 83.9 kg   SpO2 94%   BMI 26.54 kg/m  Physical Exam Vitals and nursing note reviewed.  Constitutional:      General: She is not in acute distress.    Appearance: Normal appearance. She is well-developed.  HENT:     Head: Normocephalic and atraumatic.  Eyes:     General:        Right eye: No discharge.        Left eye: No discharge.     Extraocular Movements: Extraocular movements intact.     Conjunctiva/sclera: Conjunctivae normal.     Pupils: Pupils are equal, round, and reactive to light.     Comments: Bilateral lower lid styes.  Both fairly small in size.  Sclera itself is clear.  Anterior chamber clear.  Cardiovascular:     Rate and Rhythm: Normal rate and regular rhythm.     Heart sounds: No murmur heard. Pulmonary:     Effort: Pulmonary effort is normal. No respiratory distress.     Breath sounds: Normal breath sounds.  Abdominal:     Palpations: Abdomen is soft.     Tenderness: There is no abdominal tenderness.  Musculoskeletal:        General: No swelling.     Cervical back: Normal range of motion and neck supple.  Skin:    General: Skin is warm and dry.     Capillary Refill: Capillary refill takes less than 2 seconds.  Neurological:     General: No focal deficit present.     Mental Status: She is alert and oriented to person, place, and time.  Psychiatric:        Mood and Affect: Mood normal.     ED Results / Procedures / Treatments   Labs (all labs ordered are listed, but only abnormal results are displayed) Labs Reviewed - No data to display  EKG None  Radiology No results found.  Procedures Procedures    Medications Ordered in ED Medications - No data to display  ED Course/ Medical Decision Making/ A&P                           Medical Decision Making Risk Prescription drug management.   Findings consistent with bilateral lower lid eye styes.  We will continue the erythromycin ointment have her follow-up with ophthalmology  since they are persistent.  But suspect they just may need longer time to heal.  Note no evidence of any acute complication.   Final Clinical Impression(s) / ED Diagnoses Final diagnoses:  Hordeolum of right lower eyelid, unspecified hordeolum type  Hordeolum of left lower eyelid, unspecified hordeolum type    Rx / DC Orders ED Discharge Orders          Ordered    erythromycin ophthalmic ointment  3 times daily        12/10/21  [place], MD 12/10/21 [phone]

## 2021-12-10 NOTE — ED Triage Notes (Signed)
Patient c/o stye to bilateral eyes x 3 weeks. Patient seen at urgent care 2 weeks ago for same. Patient requesting stronger medication for eye. Patient denies change in vision.

## 2021-12-11 DIAGNOSIS — I499 Cardiac arrhythmia, unspecified: Secondary | ICD-10-CM

## 2021-12-11 DIAGNOSIS — R5383 Other fatigue: Secondary | ICD-10-CM

## 2021-12-11 NOTE — Result Encounter Note (Signed)
Paula Bowman,    Your heart monitor report indicates you are having atrial tachycardia where the top portion of your heart is beating at over 100 beats per minute. You are also having premature atrial contractions where the top part of your heart contracts an extra beat before the bottom part of your heart. You have occasional premature ventricular contractions where the bottom part contracts a couple times before the top part.     Given this, I have copied your PCP and would recommend a referral to cardiology, as well as discuss some possible medications that may help slow your heart rate. Your blood pressure was low at the last reading which is why I think close monitoring by your PCP would be important before starting any medications.

## 2021-12-13 ENCOUNTER — Telehealth (INDEPENDENT_AMBULATORY_CARE_PROVIDER_SITE_OTHER): Payer: Self-pay | Admitting: Internal Medicine

## 2021-12-13 ENCOUNTER — Telehealth (INDEPENDENT_AMBULATORY_CARE_PROVIDER_SITE_OTHER): Payer: Self-pay | Admitting: Medical

## 2021-12-13 ENCOUNTER — Encounter (INDEPENDENT_AMBULATORY_CARE_PROVIDER_SITE_OTHER): Payer: Self-pay | Admitting: Medical

## 2021-12-13 ENCOUNTER — Emergency Department: Payer: Self-pay

## 2021-12-13 ENCOUNTER — Other Ambulatory Visit (INDEPENDENT_AMBULATORY_CARE_PROVIDER_SITE_OTHER): Payer: Self-pay | Admitting: Medical

## 2021-12-13 DIAGNOSIS — I4719 Other supraventricular tachycardia: Secondary | ICD-10-CM

## 2021-12-13 NOTE — Telephone Encounter (Addendum)
Called and spoke to [facility] about patch monitor results rec'd today, reading 12/11/21.   Reading Physician Reading Date Result Priority   Nyra Jabs, MD  [phone] 12/11/2021      Result Text  - Predominant rhythm: NSR,   - Atrial Tachycardia (AT) 1500 episodes, Longest 20 beats @ Avg 109 bpm up to 140 bpm, Fastest 6 beats @ Avg 191 bpm up to  223 bpm,   - PAC 1.6 %,   - PVC 0.2 %    Recommend cardiology consult, possible medication management but symptomatic low BP.   Charitie reports 1 yr hx/o frequent episodes of weakness, heart pounding and progressive fatigue. She also reports chronic low BP with lightheadedness with position changes.     Discussed with Dr Gershon Mussel advised to go to ER for evaluation and intervention with monitoring.     Called pt back and advised above. Pt verbalized understanding and going by POV to [place].

## 2021-12-13 NOTE — Telephone Encounter (Signed)
Patient is at Emergency room and doctor Mohhammed Alzawad would like to speak to PCP.  Please call on      [phone]

## 2021-12-13 NOTE — Telephone Encounter (Signed)
Returned call, Dr states attempted to call several times w/o call back so discharged the pt, hung up.

## 2021-12-13 NOTE — Telephone Encounter (Signed)
RETURN CALL: Voicemail - Not Available      SUBJECT:  Medical Concerns/Information - Medical Concerns/Information      MESSAGE: Valary Nurse from [facility] calling in regards to previous encounter requesting call back from patient PCP today. Hospital doctor Mohhammed Alzawad needs to speak with patients pcp as soon as possible please call back

## 2021-12-13 NOTE — Progress Notes (Signed)
PT sent to ER for symptomatic atrial tachycardia, [facility] triage notified.

## 2021-12-13 NOTE — Telephone Encounter (Signed)
RETURN CALL: Voicemail - Not Available      SUBJECT:  Medical Concerns/Information - Medical Concerns/Information      MESSAGE:  Valary the nurse from [facility] requested a call from PCP as soon as possible today.   Patient is at Emergency room and doctor Mohhammed Alzawad would like to speak to PCP.  Please call on      [phone]

## 2021-12-14 ENCOUNTER — Telehealth (INDEPENDENT_AMBULATORY_CARE_PROVIDER_SITE_OTHER): Payer: Self-pay | Admitting: Internal Medicine

## 2021-12-14 ENCOUNTER — Encounter (INDEPENDENT_AMBULATORY_CARE_PROVIDER_SITE_OTHER): Payer: Self-pay | Admitting: Internal Medicine

## 2021-12-14 NOTE — Telephone Encounter (Signed)
Urgent cardiology referral was processed and faxed yesterday.  I was able to see it uploaded into their system via CE.  I called and spoke with patient.  I let her know above and that she can call right now to scheudle her appt.  She said she will do it right now.    [facility]   [address]   [place] [address]  [phone])  [phone])

## 2021-12-14 NOTE — Telephone Encounter (Signed)
See TE dated 12/14/21. Duplicated request.

## 2021-12-14 NOTE — Telephone Encounter (Signed)
Pt has reached out to [place] by Telephone requesting a new cardiology referral for this patient's Atrial tachycardia, paroxysmal (See CareEverywhere 12/13/21) to be sent to [facility].      They were last seen in this clinic on 11/07/2021.      Referral is pended routing to provider.      *When responding, please route back to P [facility] REFERRAL POOL*    Please see message from pt, requesting a call back from [facility] .

## 2021-12-14 NOTE — Telephone Encounter (Signed)
FWD message to referral pool for a referral location revision - open referral patient looking to find Cardiologist who can possibly see patient sooner.  Thank you!

## 2021-12-14 NOTE — Telephone Encounter (Signed)
RETURN CALL: Voicemail - Detailed Message      SUBJECT:  Referral Request/Questions - Outgoing     REASON FOR REFERRAL: recent visit to the ER   NAME OF CLINIC/SPECIALTY: cardiologist - [facility]   PROVIDER:   PHONE:   FAX:   ADDITIONAL INFORMATION: Patient called in upset due to not being able to speak with someone in the [facility] [facility] clinic. Patient mentioned she was instructed by Tharon Aquas to go to the ER after receiving some bad results. Due to being unable to make it to a [facility] ER, patient went to [place]. As a result of being seen at [facility] ER, patient received a referral to a cardiologist within [facility] system. Patient is requesting an urgent referral to a cardiologist within the [facility] system instead. Patient is also requesting a call back from Tharon Aquas in order to discuss concerns and questions.

## 2021-12-14 NOTE — Telephone Encounter (Signed)
Referral was already sent yesterday

## 2021-12-14 NOTE — Telephone Encounter (Signed)
Called and spoke with pt. Very upset about condition, having panic attack prior to ER visit and being sent home without intervention, referred to their cardiologist. Appt [place] 11/8.   Requests [facility] provider (referral sent yesterday) phone # provided to her but referral may not be processed.   Reassurance EKG, labs unremarkable.   C/o increase fatigue, weakness, palpitations 6-8/day, states can't catch her breath, anxious.     Advised to report new sx, pt verballized understanding.

## 2021-12-15 NOTE — Telephone Encounter (Signed)
Referral processed  ecare message sent to patient to inform and gave scheduling info

## 2021-12-20 NOTE — Progress Notes (Signed)
Went to see neurologist due to lightheadedness;     2-3 times over the past 2-3 years, sitting, and suddenly feels lightheaded.     Fahter with 2V CABG in his 57s.     3/4 bootle wine for 25yr.

## 2021-12-20 NOTE — Progress Notes (Signed)
CARDIAC ELECTROPHYSIOLOGY CONSULTATION    DOS: 12/20/2021    ID/CC: Paula Bowman is a(n) 71 y.o. female reffered by Dr. Drusilla Bowman for consideration of atrial tachycardia. My assessment and recommendations follow immediately, with the relevant history, examination, and diagnostic study findings below.     IMPRESSION/RECOMMENDATIONS:   Paroxysmal atrial tachycardia  Fatigue    71 year old female with minimally symptomatic episodes of paroxysmal atrial tachycardia.  She is quite anxious about these more so than she is truly symptomatic.  I reviewed with her the pathophysiology of various forms of supraventricular tachycardia focusing on atrial tachycardia.  Her episodes are very short-lived, the longest just out to 20 beats duration; she does describe some episodes with seem to last longer, perhaps as long as 5 minutes which may represent simply longer episodes of AT versus another form of tachyarrhythmia.  Nonetheless, her symptoms are not overtly alarming and I tried to reassure her of this today.    In the meantime, we will obtain some more information; specifically request tracings from the [facility] system-specifically ECG and CAM patch tracings.  I think it is worthwhile to obtain her resting transthoracic echocardiogram to ensure that she does not have any valvular disease, substantial chamber enlargement-particularly in light of her fatigability.    Reasonable to initiate an attempt at suppression by taking metoprolol succinate starting with one half tab daily for a few days and then if tolerating this she should increase to 1 whole tab of 25 mg succinate once daily.    She does feel fatigued which is her primary complaint and notes that she does not awaken feeling very rested.  It is possible she is experiencing sleep apnea although does not note any history of snoring; she does not appear to be obese.  Her partner notes that she does slow her breathing down and sometimes becomes quite quiet  for several minutes at a time but is never had any overt clear apneic episodes.  Nonetheless, sleep study may be helpful here and will refer her to pulmonary medicine for sleep evaluation.  Right atrial dilatation and atrial tachycardia could be a manifestation of chronic sleep apnea though in her case would be somewhat unexpected.    Thank you for the opportunity to participate in the care of this patient. Please feel free to contact me directly with any questions or concerns.     FLU VACCINATION: Patient counseled on the benefits of flu vaccination.    SMOKING CESSATION: Nonsmoker    Paula Pinto, MD  Cardiac Electrophysiology    ------------------------------------------------------------------------------------      HISTORY OF PRESENT ILLNESS:   71yo F referred by Dr. Drusilla Bowman for palpitations. She presented to an outside ED on 12/13/21 with palpitations.    She has a smart watch which is demonstrated irregular heartbeats.  This was an incidental finding though the patient does note fatigue which has been ongoing for the past year.  This is much worse fatigue than she had 1 year prior.  She finds that she has a harder time finding motivation to get "get up and go" in the mornings.  She also tires more easily than she would expect.    She followed up with her primary care provider Dr. Eulah Bowman on 11/06/2021.  Had noted feeling "off" for roughly 1 year.  An EKG was ordered which demonstrated sinus rhythm with sinus arrhythmia; subsequent patch monitor (I believe is 14 days) demonstrated multiple salvos of atrial tachycardia, the longest out to just 20 beats  duration. Monitor resulted on 12/09/2021 which describes 1500 episodes of atrial tachycardia the longest that the 20 beats to rates up to 223 bpm, typically averaging around 110 to 140 bpm; 1.6% PAC burden, 0.2% PVC burden.  Outside electrocardiogram on 12/13/2021 shows sinus rhythm with sinus arrhythmia with a rate around 70 (report only); prior EKG on  11/07/2021 shows sinus rhythm with PACs.    On finding the results of this patch monitor, the patient was instructed to go to the nearest emergency department due to the infrequency of her episodes of atrial tachycardia (1500 episodes).  She was driving herself to the emergency department when she would begin with become extremely anxious felt some tightness in her chest and shortness of breath.  She presented to the ED in today [facility] in [facility] where she was evaluated by Dr. Haze Bowman reassured her.  Her acute symptoms were thought attributable to acute anxiety.  Her work-up was otherwise benign.  Found to be in sinus rhythm by electrocardiogram.  Laboratory studies were unremarkable.    Paula Bowman herself reports feeling generally fatigued for the past year or so.  She describes this as a general sense of lack of motivation but also feeling that she fatigues very easily on exertion.  She notes that she does not feel rested when awakening in the morning and has to really motivate herself to get out of bed.  This is unusual for her and unchanged from prior years.    He occasionally experiences a sense that her heart skips or feels a fullness in her central chest.  Sometimes she will feel that this is happening very frequently over the course of 5 minutes or so, more commonly she experiences a sense of her heart skipping quite rarely.    She used to be an avid runner, she has never had any known cardiovascular disorder.  No known history of congenital heart disease, has always been able to keep up with her peers throughout her life.  Denies any history of syncope, no near syncope.  Denies overt orthopnea, no paroxysmal nocturnal dyspnea, no early satiety, no lower extremity edema.    REVIEW OF SYSTEMS:  Review of Systems   Constitutional: Negative.  Negative for chills, fever, malaise/fatigue and weight loss.   HENT: Negative.  Negative for tinnitus.    Eyes: Negative.    Respiratory: Negative.   Negative for shortness of breath.    Cardiovascular: Negative.  Negative for chest pain, palpitations, orthopnea, leg swelling and PND.   Gastrointestinal: Negative.    Genitourinary: Negative.    Musculoskeletal: Negative.  Negative for joint pain.   Skin: Negative.    Neurological: Negative.  Negative for tremors, focal weakness, seizures and loss of consciousness.   Endo/Heme/Allergies: Negative.  Does not bruise/bleed easily.   Psychiatric/Behavioral: Negative.     All other systems reviewed and are negative.    The patient's medical, social, and family histories were otherwise reviewed in Epic and updated as appropriate, based upon review and discussion with the patient today. Pertinent information is included below.     PRIMARY CARE PROVIDER: Kathee Polite, MD  PRIMARY CARDIOLOGIST: None    CARDIOVASCULAR HISTORY:   PAT (as above)    PERTINENT PAST MEDICAL HISTORY:   Anxiety  GERD  Environmental allergies    ALLERGIES:   Allergies   Allergen Reactions   . Amoxicillin Rash   . Levaquin [Levofloxacin] Itching     Redness up arm from IV dose   .  Pcn [Penicillins] Rash       MEDICATIONS:    Current Outpatient Medications:   .  ascorbic acid (VITAMIN C) 500 MG tablet, Take 500 mg by mouth daily., Disp: , Rfl:   .  aspirin 81 MG EC tablet, Take 81 mg by mouth daily., Disp: , Rfl:   .  b complex vitamins capsule, Take 1 capsule by mouth daily., Disp: , Rfl:   .  cholecalciferol, vitamin D3, (D3 DOTS) 2,000 unit Tab, Take by mouth., Disp: , Rfl:   .  dicyclomine (BENTYL) 10 MG capsule, Take 1 capsule (10 mg total) by mouth 3 (three) times daily as needed., Disp: 90 capsule, Rfl: 1  .  docusate sodium (COLACE) 100 MG capsule, Take 100 mg by mouth 2 (two) times daily., Disp: , Rfl:   .  esomeprazole (NEXIUM) 20 MG capsule, Take 20 mg by mouth every morning before breakfast., Disp: , Rfl:   .  ESOMEPRAZOLE MAGNESIUM (NEXIUM ORAL), Take by mouth every other day., Disp: , Rfl:   .  fluticasone (FLONASE) 50 mcg/actuation  nasal spray, 1 spray by Nasal route daily. Indications: Allergic Rhinitis, Disp: , Rfl:   .  HYDROCODONE-ACETAMINOPHEN ORAL, Take by mouth., Disp: , Rfl:   .  magnesium oxide (MAGOX) 400 mg tablet, Take 400 mg by mouth daily., Disp: , Rfl:   .  multivitamin (THERAGRAN) per tablet, Take 1 tablet by mouth daily., Disp: , Rfl:   .  oxyCODONE-acetaminophen (PERCOCET) 5-325 mg per tablet, Take 1 tablet by mouth every 6 (six) hours as needed for pain., Disp: 5 tablet, Rfl: 0  .  peg-electrolyte soln 420 gram SolR, Per [facility] Instructions, Disp: 1 Bottle, Rfl: 0    FAMILY HISTORY:   Her family history includes Breast cancer in her paternal aunt; Breast cancer (age of onset: 35) in her sister. . Otherwise, no known family history of arrhythmia syndrome, sudden death, drowning, or unexplained death. No known family history of cardiomyopathy, or early coronary artery disease.     SOCIAL HISTORY:   He is a retired Financial controller. Otherwise,  reports that she quit smoking about 35 years ago. Her smoking use included cigarettes. She has a 13.00 pack-year smoking history. She has never used smokeless tobacco. She reports current alcohol use of about 2.0 standard drinks per week. She reports that she does not use drugs.     PHYSICAL EXAMINATION:   LMP  (LMP Unknown)   Physical Exam   Constitutional: She appears healthy. No distress.   Thin, neatly dressed; outwardly anxious.   HENT:   Mouth/Throat: No dental caries. Oropharynx is clear.   Eyes: Pupils are equal, round, and reactive to light. Conjunctivae are normal.   Neck: Thyroid normal. No neck adenopathy.   Cardiovascular: Regular rhythm, S1 normal, S2 normal and normal pulses. PMI is not displaced. Exam reveals no gallop.   No murmur heard.  Pulses:       Carotid pulses are 2+ on the right side and 2+ on the left side.       Radial pulses are 2+ on the right side and 2+ on the left side.   Pulmonary/Chest: Effort normal and breath sounds normal. No stridor. She  has no wheezes. She has no rales. She exhibits no tenderness.   Abdominal: Soft. Bowel sounds are normal. She exhibits no distension.   Musculoskeletal:         General: Normal range of motion.      Cervical back: Normal range  of motion and neck supple.   Neurological: She is alert and oriented to person, place, and time. She has intact cranial nerves. Gait normal.   Skin: Skin is warm and dry. No rash noted.   The following diagnostic examinations and imaging studies were reviewed by myself in relation to this visit:     ECG:   12/20/21: sinus rhythm at 64 bpm; normal QRS, duration 95ms. Qtc .     14d CAM patch 12/11/21:   "- Predominant rhythm: NSR,   - Atrial Tachycardia (AT) 1500 episodes, Longest 20 beats @ Avg 109 bpm up to 140 bpm, Fastest 6 beats @ Avg 191 bpm up to  223 bpm,   - PAC 1.6 %,   - PVC 0.2 %     ATTENDING ADDENDUM:   Agree with preliminary report above. There were 71 patient activations, which were associated with SR with or without ectopy,  PACs, PVCs and AT. "    ECHOCARDIOGRAM:   None available.     IMAGING STUDIES:   View chest x-ray, 04/02/2019: No acute intrathoracic abnormality; no cardiomegaly.    LABORATORY STUDIES:   Reviewed in Epic.     12/15/2021: Potassium 3.9, creatinine 0.83.  TSH mildly elevated at 5.478, free T40.8.  Hemoglobin is 14.3, platelets are 200.

## 2021-12-23 NOTE — Progress Notes (Signed)
A user error has taken place: encounter opened in error, closed for administrative reasons.

## 2021-12-27 ENCOUNTER — Ambulatory Visit: Payer: Self-pay

## 2021-12-27 ENCOUNTER — Encounter: Payer: Self-pay | Admitting: Internal Medicine

## 2021-12-27 ENCOUNTER — Ambulatory Visit (INDEPENDENT_AMBULATORY_CARE_PROVIDER_SITE_OTHER): Payer: HMO | Admitting: Internal Medicine

## 2021-12-27 ENCOUNTER — Ambulatory Visit (INDEPENDENT_AMBULATORY_CARE_PROVIDER_SITE_OTHER): Payer: HMO | Admitting: Family Medicine

## 2021-12-27 VITALS — BP 118/78 | HR 76 | Temp 98.1°F | Ht 70.0 in | Wt 191.0 lb

## 2021-12-27 VITALS — BP 118/78 | HR 76 | Ht 70.0 in | Wt 191.0 lb

## 2021-12-27 DIAGNOSIS — Z23 Encounter for immunization: Secondary | ICD-10-CM | POA: Diagnosis not present

## 2021-12-27 DIAGNOSIS — R2232 Localized swelling, mass and lump, left upper limb: Secondary | ICD-10-CM

## 2021-12-27 DIAGNOSIS — F419 Anxiety disorder, unspecified: Secondary | ICD-10-CM | POA: Diagnosis not present

## 2021-12-27 DIAGNOSIS — M7989 Other specified soft tissue disorders: Secondary | ICD-10-CM

## 2021-12-27 MED ORDER — ALPRAZOLAM 1 MG PO TABS: ORAL_TABLET | ORAL | 1 refills | Status: DC

## 2021-12-27 NOTE — Assessment & Plan Note (Signed)
Cont w/prn Xanax  Potential benefits of a long term benzodiazepines  use as well as potential risks  and complications were explained to the patient and were aknowledged.

## 2021-12-27 NOTE — Progress Notes (Signed)
   I, Peterson Lombard, LAT, ATC acting as a scribe for Lynne Leader, MD.  Catherine Mora is a 71 y.o. female who presents to [place] at [facility] today for soft tissue mass on her L arm.  Patient was last seen by Dr. Georgina Snell on 09/27/2020 for a different soft tissue mass on her L upper arm. Pt called the office on 12/13/20 reporting cont'd L arm pain/mass and a humeral MRI was ordered Today, patient reports she has 2 new nodules on her L arm. Pt locates mass to the each side of the cubital fossa. Pt notes that as she gets older she really worries about these types of things.  Her son was recently diagnosed with prostate cancer.  Dx imaging: 01/08/21 L humerus MRI  09/27/20 L humerus XR  Pertinent review of systems: No fevers or chills  Relevant historical information: Hypertension   Exam:  BP 118/78   Pulse 76   Ht '5\' 10"'$  (1.778 m)   Wt 191 lb (86.6 kg)   SpO2 98%   BMI 27.41 kg/m  General: Well Developed, well nourished, and in no acute distress.   MSK: Elbow is normal-appearing 2 palpable subcutaneous masses left antecubital fossa 1 medial and 1 more lateral.  Nontender.  Normal elbow motion and strength.    Lab and Radiology Results  Diagnostic Limited MSK Ultrasound of: Left anterior elbow The more lateral mass was visualized first and is consistent in appearance with the surrounding subcutaneous tissue and a lipoma.  No increased vascular activity in the subcutaneous tissue. The medial mass was visualized secondary.  This is more difficult to see on ultrasound.  The mass is more palpable than it is visible but the subcutaneous fat is slightly more prominent this area which could be a lipoma.  It does overlie an artery which could be part of the palpable prominence.  Again no vascular activity in the subcutaneous tissue. 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     Assessment and Plan: 71 y.o. female with elbow masses thought to be lipomas.  Plan  for watchful waiting for now.  Recheck back as needed.  Could proceed to MRI if needed.   PDMP not reviewed this encounter. Orders Placed This Encounter  Procedures   [place] LIMITED JOINT SPACE STRUCTURES UP LEFT(NO LINKED CHARGES)    Order Specific Question:   Reason for Exam (SYMPTOM  OR DIAGNOSIS REQUIRED)    Answer:   left arm pain    Order Specific Question:   Preferred imaging location?    Answer:   [facility]   No orders of the defined types were placed in this encounter.    Discussed warning signs or symptoms. Please see discharge instructions. Patient expresses understanding.   The above documentation has been reviewed and is accurate and complete Lynne Leader, M.D.

## 2021-12-27 NOTE — Progress Notes (Signed)
Subjective:  Patient ID: Catherine Mora, female    DOB: Dec 29, 1950  Age: 71 y.o. MRN: [id number]  CC: Mass (On (L) fore arm- Flu shot)   HPI Catherine Mora presents for lumps on the L arm x 2 C/o a nodule on the L palm  C/o anxiety, stress - son was dx'd w/prostate cancer  Outpatient Medications Prior to Visit  Medication Sig Dispense Refill   BIOTIN FORTE PO Take 500 mg by mouth daily.     bisacodyl (DULCOLAX) 5 MG EC tablet Take 1 tablet (5 mg total) by mouth daily as needed for moderate constipation. 30 tablet 3   DULoxetine (CYMBALTA) 20 MG capsule Take 1 capsule (20 mg total) by mouth 2 (two) times daily. 180 capsule 3   DULoxetine (CYMBALTA) 30 MG capsule TAKE 1 CAPSULE BY MOUTH EVERY DAY 90 capsule 1   erythromycin ophthalmic ointment Place into both eyes 3 (three) times daily. 3.5 g 0   glimepiride (AMARYL) 2 MG tablet Take 2 mg by mouth every morning.     ibuprofen (ADVIL,MOTRIN) 200 MG tablet Take 400 mg by mouth every 6 (six) hours as needed for headache.     insulin detemir (LEVEMIR) 100 UNIT/ML FlexPen Inject 16 Units into the skin 2 (two) times daily.     Insulin Pen Needle 31G X 5 MM MISC Use with Levemir pen 100 each 0   lisinopril (ZESTRIL) 10 MG tablet Take 1 tablet by mouth daily.     lovastatin (MEVACOR) 20 MG tablet TAKE 1 TABLET BY MOUTH EVERY DAY 90 tablet 1   NOVOLOG FLEXPEN 100 UNIT/ML FlexPen TAKE AS DIRECTED PER SLIDING SCALE. MAX DAILY DOSE: 50 UNITS/DAY.  3   omeprazole (PRILOSEC) 20 MG capsule Take 1 capsule (20 mg total) by mouth daily. Please schedule an yearly office visit for further refills. Thank you 90 capsule 0   ONETOUCH VERIO test strip 1 each by Other route 2 (two) times daily.      Polyethyl Glycol-Propyl Glycol (SYSTANE FREE OP) Apply to eye.     ALPRAZolam (XANAX) 1 MG tablet Take 0.5 mg in morning and 1 mg at night prn 135 tablet 1   No facility-administered medications prior to visit.    ROS: Review of Systems  Constitutional:   Negative for activity change, appetite change, chills, fatigue and unexpected weight change.  HENT:  Negative for congestion, mouth sores and sinus pressure.   Eyes:  Negative for visual disturbance.  Respiratory:  Negative for cough and chest tightness.   Gastrointestinal:  Negative for abdominal pain and nausea.  Genitourinary:  Negative for difficulty urinating, frequency and vaginal pain.  Musculoskeletal:  Negative for arthralgias, back pain and gait problem.  Skin:  Negative for pallor and rash.  Neurological:  Negative for dizziness, tremors, weakness, numbness and headaches.  Psychiatric/Behavioral:  Negative for confusion and sleep disturbance. The patient is nervous/anxious.     Objective:  BP 118/78 (BP Location: Left Arm)   Pulse 76   Temp 98.1 F (36.7 C) (Oral)   Ht '5\' 10"'$  (1.778 m)   Wt 191 lb (86.6 kg)   SpO2 97%   BMI 27.41 kg/m   BP Readings from Last 3 Encounters:  12/27/21 118/78  12/10/21 125/88  11/23/21 116/79    Wt Readings from Last 3 Encounters:  12/27/21 191 lb (86.6 kg)  12/10/21 185 lb (83.9 kg)  09/28/21 186 lb 9.6 oz (84.6 kg)    Physical Exam Constitutional:  General: She is not in acute distress.    Appearance: She is well-developed.  HENT:     Head: Normocephalic.     Right Ear: External ear normal.     Left Ear: External ear normal.     Nose: Nose normal.  Eyes:     General:        Right eye: No discharge.        Left eye: No discharge.     Conjunctiva/sclera: Conjunctivae normal.     Pupils: Pupils are equal, round, and reactive to light.  Neck:     Thyroid: No thyromegaly.     Vascular: No JVD.     Trachea: No tracheal deviation.  Cardiovascular:     Rate and Rhythm: Normal rate and regular rhythm.     Heart sounds: Normal heart sounds.  Pulmonary:     Effort: No respiratory distress.     Breath sounds: No stridor. No wheezing.  Abdominal:     General: Bowel sounds are normal. There is no distension.      Palpations: Abdomen is soft. There is no mass.     Tenderness: There is no abdominal tenderness. There is no guarding or rebound.  Musculoskeletal:        General: No tenderness.     Cervical back: Normal range of motion and neck supple. No rigidity.  Lymphadenopathy:     Cervical: No cervical adenopathy.  Skin:    Findings: No erythema or rash.  Neurological:     Mental Status: She is oriented to person, place, and time.     Cranial Nerves: No cranial nerve deficit.     Motor: No abnormal muscle tone.     Coordination: Coordination normal.     Deep Tendon Reflexes: Reflexes normal.  Psychiatric:        Behavior: Behavior normal.        Thought Content: Thought content normal.        Judgment: Judgment normal.   New sq ?lipomas x2 on the L elbow/elbow fossa, NT, soft   Lab Results  Component Value Date   WBC 6.3 03/28/2021   HGB 12.5 03/28/2021   HCT 38.4 03/28/2021   PLT 285.0 03/28/2021   GLUCOSE 119 (H) 03/28/2021   CHOL 266 (H) 03/04/2019   TRIG 149.0 03/04/2019   HDL 49.80 03/04/2019   LDLDIRECT 219.0 08/14/2014   LDLCALC 186 (H) 03/04/2019   ALT 16 03/28/2021   AST 19 03/28/2021   NA 141 03/28/2021   K 4.8 03/28/2021   CL 104 03/28/2021   CREATININE 1.01 03/28/2021   BUN 24 (H) 03/28/2021   CO2 32 03/28/2021   TSH 2.83 03/28/2021   HGBA1C 6.9 (H) 03/28/2021   MICROALBUR 1.9 03/28/2021    No results found.  Assessment & Plan:   Problem List Items Addressed This Visit     Mass of soft tissue of upper arm - Primary    New ?lipomas x2 on the L elbow/elbow fossa Ref to Dr Georgina Snell for a consultation/MSK [place]      Relevant Orders   Ambulatory referral to Sports Medicine   Anxiety    Cont w/prn Xanax  Potential benefits of a long term benzodiazepines  use as well as potential risks  and complications were explained to the patient and were aknowledged.      Relevant Medications   ALPRAZolam (XANAX) 1 MG tablet   Other Visit Diagnoses     Needs flu shot  Relevant Orders   Flu Vaccine QUAD High Dose(Fluad) (Completed)         Meds ordered this encounter  Medications   ALPRAZolam (XANAX) 1 MG tablet    Sig: Take 0.5 mg in morning and 1 mg at night prn    Dispense:  135 tablet    Refill:  1      Follow-up: No follow-ups on file.  Walker Kehr, MD

## 2021-12-27 NOTE — Patient Instructions (Signed)
Thank you for coming in today.   I think these are lipomas.  Ok to do some watchful waiting.  If needed next step would be xray and MRI.   Let me know.

## 2021-12-27 NOTE — Assessment & Plan Note (Signed)
New ?lipomas x2 on the L elbow/elbow fossa Ref to Dr Georgina Snell for a consultation/MSK [place]

## 2022-01-09 ENCOUNTER — Other Ambulatory Visit: Payer: Self-pay | Admitting: Gastroenterology

## 2022-01-16 ENCOUNTER — Telehealth: Payer: HMO | Admitting: Nurse Practitioner

## 2022-01-16 DIAGNOSIS — J309 Allergic rhinitis, unspecified: Secondary | ICD-10-CM

## 2022-01-16 NOTE — Progress Notes (Signed)
E visit for Allergic Rhinitis We are sorry that you are not feeling well.  Here is how we plan to help!  Based on what you have shared with me it looks like you have Allergic Rhinitis.  Rhinitis is when a reaction occurs that causes nasal congestion, runny nose, sneezing, and itching.  Most types of rhinitis are caused by an inflammation and are associated with symptoms in the eyes ears or throat. There are several types of rhinitis.  The most common are acute rhinitis, which is usually caused by a viral illness, allergic or seasonal rhinitis, and nonallergic or year-round rhinitis.  Nasal allergies occur certain times of the year.  Allergic rhinitis is caused when allergens in the air trigger the release of histamine in the body.  Histamine causes itching, swelling, and fluid to build up in the fragile linings of the nasal passages, sinuses and eyelids.  An itchy nose and clear discharge are common.  I recommend the following over the counter treatments: Switch from Claritin to: Allegra 60 mg twice daily  I also would recommend a nasal spray: Saline 1 spray into each nostril as needed This is over the counter in the allergy area, this will help keep your nasal passages moisturized and prevent the bloody noses.  You may also put a little Vaseline inside your nasal passages for comfort and consider putting a humidifier in your bedroom    HOME CARE:  You can use an over-the-counter saline nasal spray as needed Avoid areas where there is heavy dust, mites, or molds Stay indoors on windy days during the pollen season Keep windows closed in home, at least in bedroom; use air conditioner. Use high-efficiency house air filter Keep windows closed in car, turn AC on re-circulate Avoid playing out with dog during pollen season  GET HELP RIGHT AWAY IF:  If your symptoms do not improve within 10 days You become short of breath You develop yellow or green discharge from your nose for over 3 days You  have coughing fits  MAKE SURE YOU:  Understand these instructions Will watch your condition Will get help right away if you are not doing well or get worse  Thank you for choosing an e-visit. Your e-visit answers were reviewed by a board certified advanced clinical practitioner to complete your personal care plan. Depending upon the condition, your plan could have included both over the counter or prescription medications. Please review your pharmacy choice. Be sure that the pharmacy you have chosen is open so that you can pick up your prescription now.  If there is a problem you may message your provider in [place] to have the prescription routed to another pharmacy. Your safety is important to [place]. If you have drug allergies check your prescription carefully.  For the next 24 hours, you can use MyChart to ask questions about today's visit, request a non-urgent call back, or ask for a work or school excuse from your e-visit provider. You will get an email in the next two days asking about your experience. I hope that your e-visit has been valuable and will speed your recovery.  I spent approximately 5 minutes reviewing the patient's history, current symptoms and coordinating their plan of care today.

## 2022-01-18 NOTE — Progress Notes (Signed)
History of Present Illness:     Patient seeing cardiology for atrial tachycardia noting daytime fatigue prompting sleep apnea evaluation.  Denies any longtime sleep concerns or snoring.  Has been tracking data on smart watch and noted to have episodes of sleep-related hypoxia and tachycardia.  Otherwise unaware of any snoring or breathing concerns during sleep.  Denies any significant daytime sleepiness concerns.  ESS 5.    Now retired.  Estimates getting 78 hours of sleep per night.  Typically in bed by 10-11 PM and gets up by 7 AM.  Wakes 3-4 times at night typically because of overactive bladder which has developed and now sleep less restorative over the last year.  Also his history of colitis that she attributes to her daytime fatigue though this has been doing a bit better in the last year.  She takes naps fairly often which helps with daytime tiredness and fatigue.  Symptoms have been a bit better since starting on metoprolol after seeing cardiology clinic.  Worse dry mouth and mild RLS in the  last year.  Leg cramping better since starting on Mg.  Aside from information noted on her watch she would otherwise not appreciating concerns related to her sleep.  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    Patient's wife on CPAP for OSA and medication to treat RLS.      No cataplexy, hypnogogic hallucinations, sleep paralysis. No unusual sleep behaviors.     Family history: No known sleep apnea concerns.      Pertinent problems:   Sleep-related hypoxemia and atrial tachycardia  Mild daytime sleepiness      Physical Exam:     Vitals:    01/19/22 0950   BP: 102/54   Pulse: 62   Resp: 18   Temp: 35.6 C (96.1 F)   TempSrc: Temporal   SpO2: 96%   Weight: 49 kg (108 lb)   Height: 5\' 8"  (172.7 cm)     Body mass index is 16.42 kg/m.    Exam:  Gen:  Alert  HEENT:   Mallampati 3, pronounced tonsillar folds.  Neck: Supple. Neck circumference (in): 11 inches  Resp:   Normal effort.   Card:  S1S2 RRR      Data Reviewed:   Recent cardiology notes reviewed.        Vaccinations:         Assessment:     1. Nocturnal oxygen desaturation    2. PAT (paroxysmal atrial tachycardia)    3. Non-restorative sleep          Assessment/Plan:   71 y.o. female with history of allergic rhinitis, anxiety, arthritis, GERD, hyperlipidemia who presents with symptoms of sleep-related hypoxemia, tachycardia, mild daytime sleepiness and nonrestorative sleep with high Mallampati score that may correlate with untreated sleep apnea only modest concern.  Certainly reasonable to do an HST to exclude any significant underlying apnea that could impact her overall health and explain symptoms.  Reviewed the pathophysiology of OSA and discussed associated sleep and health concerns.  Patient expresses some reluctance to do additional testing but agreeable to baseline HST to exclude underlying sleep disorder contributing to concerns.  If negative unlikely to have any clinically significant sleep disorder contributing to smart watch findings.      -Schedule home sleep study.      Return for review sleep study.    Total time spend counseling and charting 30 minutes  Historical Data:         Final Medication List:     Outpatient Encounter Medications as of 01/19/2022   Medication Sig Dispense Refill   . ascorbic acid (VITAMIN C) 500 MG tablet Take 500 mg by mouth daily.     Marland Kitchen aspirin 81 MG EC tablet Take 81 mg by mouth daily.     Marland Kitchen b complex vitamins capsule Take 1 capsule by mouth daily.     . cholecalciferol, vitamin D3, 50 mcg (2,000 unit) Tablet Take by mouth.     . dicyclomine (BENTYL) 10 MG capsule Take 1 capsule (10 mg total) by mouth 3 (three) times daily as needed. 90 capsule 1   . docusate sodium (COLACE) 100 MG capsule Take 100 mg by mouth 2 (two) times daily.     Marland Kitchen esomeprazole (NEXIUM) 20 MG capsule Take 20 mg by mouth every morning before breakfast.     . ESOMEPRAZOLE MAGNESIUM (NEXIUM ORAL) Take by  mouth every other day.     . famotidine (PEPCID) 10 MG tablet Take by mouth     . fluticasone propionate (FLONASE) 50 mcg/actuation nasal spray 1 spray by Nasal route daily. Indications: Allergic Rhinitis     . HYDROCODONE-ACETAMINOPHEN ORAL Take by mouth.     . magnesium oxide (MAGOX) 400 mg tablet Take 400 mg by mouth daily.     . metoprolol succinate ER (TOPROL XL) 25 MG extended release 24 hr tablet Take 1 tablet (25 mg total) by mouth daily 30 tablet 11   . multivitamin (THERAGRAN) per tablet Take 1 tablet by mouth daily.     Marland Kitchen oxyCODONE-acetaminophen (PERCOCET) 5-325 mg per tablet Take 1 tablet by mouth every 6 (six) hours as needed for pain. 5 tablet 0   . peg-electrolyte soln 420 gram SolR Per [facility] Instructions 1 Bottle 0   . simvastatin (ZOCOR) 10 MG tablet TAKE 0.5 TABLETS (5 MG) BY MOUTH DAILY. CURRENT DOSE FROM 05/19/21       No facility-administered encounter medications on file as of 01/19/2022.         Portions of this chart may have been created with speech recognition software.  Occasional wrong word or "sound-alike" substitutions may have occurred due to the inherent limitations of speech recognition software.  Please read the chart carefully and recognize, using context, where the substitutions may have occurred.  If any portion of this note does not make sense, please call the office for clarification.      Signed by  Theodore Demark,, PA-C

## 2022-01-26 ENCOUNTER — Other Ambulatory Visit (INDEPENDENT_AMBULATORY_CARE_PROVIDER_SITE_OTHER): Payer: HMO

## 2022-01-26 ENCOUNTER — Telehealth: Payer: HMO | Admitting: Physician Assistant

## 2022-01-26 ENCOUNTER — Telehealth: Payer: Self-pay | Admitting: Internal Medicine

## 2022-01-26 DIAGNOSIS — R319 Hematuria, unspecified: Secondary | ICD-10-CM

## 2022-01-26 DIAGNOSIS — E119 Type 2 diabetes mellitus without complications: Secondary | ICD-10-CM | POA: Diagnosis not present

## 2022-01-26 DIAGNOSIS — Z794 Long term (current) use of insulin: Secondary | ICD-10-CM

## 2022-01-26 DIAGNOSIS — Z0000 Encounter for general adult medical examination without abnormal findings: Secondary | ICD-10-CM

## 2022-01-26 LAB — CBC WITH DIFFERENTIAL/PLATELET
Basophils Absolute: 0.1 10*3/uL (ref 0.0–0.1)
Basophils Relative: 1.2 % (ref 0.0–3.0)
Eosinophils Absolute: 0.3 10*3/uL (ref 0.0–0.7)
Eosinophils Relative: 4.7 % (ref 0.0–5.0)
HCT: 38.9 % (ref 36.0–46.0)
Hemoglobin: 13 g/dL (ref 12.0–15.0)
Lymphocytes Relative: 35.3 % (ref 12.0–46.0)
Lymphs Abs: 2.2 10*3/uL (ref 0.7–4.0)
MCHC: 33.5 g/dL (ref 30.0–36.0)
MCV: 84.5 fl (ref 78.0–100.0)
Monocytes Absolute: 0.4 10*3/uL (ref 0.1–1.0)
Monocytes Relative: 6.1 % (ref 3.0–12.0)
Neutro Abs: 3.3 10*3/uL (ref 1.4–7.7)
Neutrophils Relative %: 52.7 % (ref 43.0–77.0)
Platelets: 270 10*3/uL (ref 150.0–400.0)
RBC: 4.6 Mil/uL (ref 3.87–5.11)
RDW: 13.4 % (ref 11.5–15.5)
WBC: 6.2 10*3/uL (ref 4.0–10.5)

## 2022-01-26 LAB — TSH: TSH: 3.4 u[IU]/mL (ref 0.35–5.50)

## 2022-01-26 LAB — COMPREHENSIVE METABOLIC PANEL WITH GFR
ALT: 14 U/L (ref 0–35)
AST: 18 U/L (ref 0–37)
Albumin: 4.2 g/dL (ref 3.5–5.2)
Alkaline Phosphatase: 70 U/L (ref 39–117)
BUN: 31 mg/dL — ABNORMAL HIGH (ref 6–23)
CO2: 26 meq/L (ref 19–32)
Calcium: 9.4 mg/dL (ref 8.4–10.5)
Chloride: 102 meq/L (ref 96–112)
Creatinine, Ser: 1.3 mg/dL — ABNORMAL HIGH (ref 0.40–1.20)
GFR: 41.29 mL/min — ABNORMAL LOW
Glucose, Bld: 153 mg/dL — ABNORMAL HIGH (ref 70–99)
Potassium: 4.4 meq/L (ref 3.5–5.1)
Sodium: 139 meq/L (ref 135–145)
Total Bilirubin: 0.4 mg/dL (ref 0.2–1.2)
Total Protein: 7.6 g/dL (ref 6.0–8.3)

## 2022-01-26 LAB — LIPID PANEL
Cholesterol: 221 mg/dL — ABNORMAL HIGH (ref 0–200)
HDL: 47.6 mg/dL (ref >39.00)
LDL Cholesterol: 142 mg/dL — ABNORMAL HIGH (ref 0–99)
NonHDL: 173.81
Total CHOL/HDL Ratio: 5
Triglycerides: 159 mg/dL — ABNORMAL HIGH (ref 0.0–149.0)
VLDL: 31.8 mg/dL (ref 0.0–40.0)

## 2022-01-26 LAB — URINALYSIS, ROUTINE W REFLEX MICROSCOPIC
Bilirubin Urine: NEGATIVE
Nitrite: POSITIVE — AB
Specific Gravity, Urine: 1.02 (ref 1.000–1.030)
Total Protein, Urine: NEGATIVE
Urine Glucose: NEGATIVE
Urobilinogen, UA: 0.2 (ref 0.0–1.0)
pH: 5.5 (ref 5.0–8.0)

## 2022-01-26 NOTE — Telephone Encounter (Signed)
Patient called - she had labs done this morning down stairs - patient is having blood in her urine - a urinalysis was done this morning.  Patient wants to know if something can be called in for her from these lab results.  Please call patient and let her know.  Patient:  [phone]  Pharmacy:  [facility] on [address] [facility] in [place]

## 2022-01-26 NOTE — Progress Notes (Signed)
Because you have pending urinalysis, etc. from your primary care provider, that would further check for possible infection, we recommend that you follow-up with them regarding your results and proper treatment.  NOTE: You will NOT be charged for this eVisit.  If you do not have a PCP, [facility] offers a free physician referral service available at [phone]. Our trained staff has the experience, knowledge and resources to put you in touch with a physician who is right for you.    If you are having a true medical emergency please call 911.   Your e-visit answers were reviewed by a board certified advanced clinical practitioner to complete your personal care plan.  Thank you for using e-Visits.

## 2022-01-27 ENCOUNTER — Ambulatory Visit: Payer: HMO | Admitting: Family Medicine

## 2022-01-27 MED ORDER — NITROFURANTOIN MONOHYD MACRO 100 MG PO CAPS: 100.0000 mg | ORAL_CAPSULE | Freq: Two times a day (BID) | ORAL | 1 refills | Status: DC

## 2022-01-27 NOTE — Telephone Encounter (Signed)
Notified pt w/MD response.../lmb 

## 2022-01-27 NOTE — Telephone Encounter (Signed)
MD out of the office pls advise on msg below...Johny Chess

## 2022-01-27 NOTE — Telephone Encounter (Signed)
Catherine Mora sent.  Thank you

## 2022-01-27 NOTE — Telephone Encounter (Signed)
Patient called back to follow up on this, she said her labs came back positive for a UTI and she was advised to call us to see if something could be called in for her. Please advise

## 2022-02-03 ENCOUNTER — Other Ambulatory Visit: Payer: Self-pay

## 2022-02-03 ENCOUNTER — Encounter (HOSPITAL_BASED_OUTPATIENT_CLINIC_OR_DEPARTMENT_OTHER): Payer: Self-pay

## 2022-02-03 ENCOUNTER — Emergency Department (HOSPITAL_BASED_OUTPATIENT_CLINIC_OR_DEPARTMENT_OTHER)
Admission: EM | Admit: 2022-02-03 | Discharge: 2022-02-03 | Disposition: A | Discharge status: Home / Self Care | Payer: HMO | Attending: Emergency Medicine | Admitting: Emergency Medicine

## 2022-02-03 ENCOUNTER — Emergency Department (HOSPITAL_BASED_OUTPATIENT_CLINIC_OR_DEPARTMENT_OTHER): Payer: HMO

## 2022-02-03 DIAGNOSIS — R1030 Lower abdominal pain, unspecified: Secondary | ICD-10-CM | POA: Diagnosis present

## 2022-02-03 DIAGNOSIS — N3001 Acute cystitis with hematuria: Secondary | ICD-10-CM | POA: Diagnosis not present

## 2022-02-03 LAB — URINALYSIS, ROUTINE W REFLEX MICROSCOPIC
Glucose, UA: 100 mg/dL — AB
Ketones, ur: NEGATIVE mg/dL
Leukocytes,Ua: NEGATIVE
Nitrite: NEGATIVE
Protein, ur: 100 mg/dL — AB
Specific Gravity, Urine: <=1.005 (ref 1.005–1.030)
pH: 5 (ref 5.0–8.0)

## 2022-02-03 LAB — URINALYSIS, MICROSCOPIC (REFLEX)

## 2022-02-03 MED ORDER — SULFAMETHOXAZOLE-TRIMETHOPRIM 800-160 MG PO TABS: 1.0000 | ORAL_TABLET | Freq: Two times a day (BID) | ORAL | 0 refills | Status: AC

## 2022-02-03 NOTE — ED Triage Notes (Incomplete)
Pt reports taking antibiotics for UTI for a week. Started passing blood clots in urine last night.Worsening last night  Pain described as excruciating. Took azo last night

## 2022-02-03 NOTE — ED Provider Notes (Signed)
[facility] EMERGENCY DEPARTMENT Provider Note   CSN: [id number] Arrival date & time: 02/03/22  0935     History  Chief Complaint  Patient presents with   Abdominal Pain    Catherine Mora is a 71 y.o. female who presents to the emergency department with concerns for suprapubic abdominal pain onset last night.  Patient notes that she was recently treated with Macrobid for UTI and notes that her symptoms were resolving until last night.  Has associated hematuria, frequency, urgency.  No additional medications tried.  Denies past medical history of kidney stones.  Denies nausea, vomiting, fever, dysuria.   The history is provided by the patient. No language interpreter was used.       Home Medications Prior to Admission medications   Medication Sig Start Date End Date Taking? Authorizing Provider  sulfamethoxazole-trimethoprim (BACTRIM DS) 800-160 MG tablet Take 1 tablet by mouth 2 (two) times daily for 5 days. 02/03/22 02/08/22 Yes Krista Godsil A, PA-C  ALPRAZolam (XANAX) 1 MG tablet Take 0.5 mg in morning and 1 mg at night prn 12/27/21   Plotnikov, Evie Lacks, MD  BIOTIN FORTE PO Take 500 mg by mouth daily.    [provider]  bisacodyl (DULCOLAX) 5 MG EC tablet Take 1 tablet (5 mg total) by mouth daily as needed for moderate constipation. 10/03/16   Plotnikov, Evie Lacks, MD  DULoxetine (CYMBALTA) 20 MG capsule Take 1 capsule (20 mg total) by mouth 2 (two) times daily. 09/28/21   Plotnikov, Evie Lacks, MD  DULoxetine (CYMBALTA) 30 MG capsule TAKE 1 CAPSULE BY MOUTH EVERY DAY 10/19/21   Plotnikov, Evie Lacks, MD  erythromycin ophthalmic ointment Place into both eyes 3 (three) times daily. 12/10/21   Fredia Sorrow, MD  glimepiride (AMARYL) 2 MG tablet Take 2 mg by mouth every morning. 06/13/21   [provider]  ibuprofen (ADVIL,MOTRIN) 200 MG tablet Take 400 mg by mouth every 6 (six) hours as needed for headache.    [provider]  insulin detemir  (LEVEMIR) 100 UNIT/ML FlexPen Inject 16 Units into the skin 2 (two) times daily.    [provider]  Insulin Pen Needle 31G X 5 MM MISC Use with Levemir pen 08/22/14   Cristal Ford, DO  lisinopril (ZESTRIL) 10 MG tablet Take 1 tablet by mouth daily. 02/15/21   [provider]  lovastatin (MEVACOR) 20 MG tablet TAKE 1 TABLET BY MOUTH EVERY DAY 10/19/21   Plotnikov, Evie Lacks, MD  nitrofurantoin, macrocrystal-monohydrate, (MACROBID) 100 MG capsule Take 1 capsule (100 mg total) by mouth 2 (two) times daily. 01/27/22   Plotnikov, Evie Lacks, MD  NOVOLOG FLEXPEN 100 UNIT/ML FlexPen TAKE AS DIRECTED PER SLIDING SCALE. MAX DAILY DOSE: 50 UNITS/DAY. 05/10/17   [provider]  omeprazole (PRILOSEC) 20 MG capsule Take 1 capsule (20 mg total) by mouth daily. Please schedule an yearly office visit for further refills. Thank you 06/27/21   Armbruster, [name], MD  [facility] VERIO test strip 1 each by Other route 2 (two) times daily.  08/14/14   [provider]  Polyethyl Glycol-Propyl Glycol (SYSTANE FREE OP) Apply to eye.    [provider]  Pitavastatin Calcium 1 MG TABS Take 1 tablet (1 mg total) by mouth daily. 03/24/19 03/26/19  Plotnikov, Evie Lacks, MD      Allergies    Metformin, Ciprofloxacin, Crestor [rosuvastatin calcium], Hydrochlorothiazide, Invokana [canagliflozin], Paroxetine, Penicillins, and Simvastatin    Review of Systems   Review of Systems  All other systems reviewed and are negative.   Physical Exam Updated Vital Signs BP 131/78   Pulse 67   Temp 97.8 F (36.6 C)   Resp 16   Ht '5\' 10"'$  (1.778 m)   Wt 84.8 kg   SpO2 93%   BMI 26.83 kg/m  Physical Exam Vitals and nursing note reviewed.  Constitutional:      General: She is not in acute distress.    Appearance: She is not diaphoretic.  HENT:     Head: Normocephalic and atraumatic.     Mouth/Throat:     Pharynx: No oropharyngeal exudate.  Eyes:     General: No scleral icterus.     Conjunctiva/sclera: Conjunctivae normal.  Cardiovascular:     Rate and Rhythm: Normal rate and regular rhythm.     Pulses: Normal pulses.     Heart sounds: Normal heart sounds.  Pulmonary:     Effort: Pulmonary effort is normal. No respiratory distress.     Breath sounds: Normal breath sounds. No wheezing.  Abdominal:     General: Bowel sounds are normal.     Palpations: Abdomen is soft. There is no mass.     Tenderness: There is abdominal tenderness in the suprapubic area. There is no right CVA tenderness, left CVA tenderness, guarding or rebound.     Comments: Suprapubic abdominal tenderness to palpation.  No CVA tenderness to palpation noted bilaterally.  Musculoskeletal:        General: Normal range of motion.     Cervical back: Normal range of motion and neck supple.  Skin:    General: Skin is warm and dry.  Neurological:     Mental Status: She is alert.  Psychiatric:        Behavior: Behavior normal.     ED Results / Procedures / Treatments   Labs (all labs ordered are listed, but only abnormal results are displayed) Labs Reviewed  URINALYSIS, ROUTINE W REFLEX MICROSCOPIC - Abnormal; Notable for the following components:      Result Value   Color, Urine ORANGE (*)    Glucose, UA 100 (*)    Hgb urine dipstick MODERATE (*)    Bilirubin Urine SMALL (*)    Protein, ur 100 (*)    All other components within normal limits  URINALYSIS, MICROSCOPIC (REFLEX) - Abnormal; Notable for the following components:   Bacteria, UA MANY (*)    All other components within normal limits  URINE CULTURE    EKG None  Radiology CT Renal Stone Study  Result Date: 02/03/2022 CLINICAL DATA:  Abdominal/flank pain, stone suspected. Urinary tract infection for 10 days with hematuria. Laterality not specified. EXAM: CT ABDOMEN AND PELVIS WITHOUT CONTRAST TECHNIQUE: Multidetector CT imaging of the abdomen and pelvis was performed following the standard protocol without IV contrast. RADIATION  DOSE REDUCTION: This exam was performed according to the departmental dose-optimization program which includes automated exposure control, adjustment of the mA and/or kV according to patient size and/or use of iterative reconstruction technique. COMPARISON:  Abdominopelvic CT 08/17/2014. FINDINGS: Lower chest: Mild linear atelectasis or scarring in the right middle lobe and both lung bases. Scattered mild coronary artery calcifications. Hepatobiliary: The liver appears unremarkable as imaged in the noncontrast state. No evidence of significant biliary dilatation status post cholecystectomy. Pancreas: Unremarkable. No pancreatic ductal dilatation or surrounding inflammatory changes. Spleen: Normal in size without focal abnormality. Adrenals/Urinary Tract: Both adrenal glands appear normal. No evidence of urinary tract calculus, suspicious renal lesion or hydronephrosis. The bladder  appears unremarkable for its degree of distention. Stomach/Bowel: No enteric contrast administered. The stomach appears unremarkable for its degree of distension. No evidence of bowel wall thickening, distention or surrounding inflammatory change. Suspected previous appendectomy. Mildly prominent stool throughout the colon. Vascular/Lymphatic: There are no enlarged abdominal or pelvic lymph nodes. Mild aortoiliac atherosclerosis. Reproductive: Status post hysterectomy. No evidence of adnexal mass. Other: Soft tissue stranding in the subcutaneous fat of the low anterior abdominal wall which may relate to subcutaneous injections or trauma. No focal fluid collection, hernia or ascites. Musculoskeletal: No acute or significant osseous findings. Multilevel lumbar spondylosis with chronic osseous foraminal narrowing at L5-S1. IMPRESSION: 1. No acute findings or explanation for the patient's symptoms. Specifically, no evidence of urinary tract calculus or hydronephrosis. 2. Mildly prominent stool throughout the colon. 3. Soft tissue stranding in  the subcutaneous fat of the low anterior abdominal wall which may relate to subcutaneous injections or trauma. No focal fluid collection, hernia or ascites. 4. Multilevel lumbar spondylosis with chronic osseous foraminal narrowing at L5-S1. 5.  Aortic Atherosclerosis (ICD10-I70.0). Electronically Signed   By: Richardean Sale M.D.   On: 02/03/2022 11:16    Procedures Procedures    Medications Ordered in ED Medications - No data to display  ED Course/ Medical Decision Making/ A&P                           Medical Decision Making Amount and/or Complexity of Data Reviewed Labs: ordered. Radiology: ordered.    Pt presents with concerns for hematuria and suprapubic abdominal pain onset last night.  Recently completed her Macrobid prescription and took Azo last night.  Patient afebrile.  On exam patient with mild suprapubic abdominal tenderness to palpation.  No CVA tenderness to palpation noted bilaterally. No acute cardiovascular, respiratory exam findings. Differential diagnosis includes acute cystitis, nephrolithiasis, pyelonephritis.  Additional history obtained:  External records from outside source obtained and reviewed including: Patient had a televisit with her primary care provider office on 01/26/2022 for similar symptoms.  At that time she had Macrobid started for her symptoms.  Labs:  I ordered, and personally interpreted labs.  The pertinent results include:   Bladder scan completed in the ED with 0 mL x 3 Urinalysis orange with moderate leukocytes, nitrate and leukocyte negative.  Patient took Azo today. Urine cultures ordered with results pending at time of discharge.  Imaging: I ordered imaging studies including CT renal stone I independently visualized and interpreted imaging which showed:  1. No acute findings or explanation for the patient's symptoms.  Specifically, no evidence of urinary tract calculus or  hydronephrosis.  2. Mildly prominent stool throughout the  colon.  3. Soft tissue stranding in the subcutaneous fat of the low anterior  abdominal wall which may relate to subcutaneous injections or  trauma. No focal fluid collection, hernia or ascites.  4. Multilevel lumbar spondylosis with chronic osseous foraminal  narrowing at L5-S1.  5.  Aortic Atherosclerosis (ICD10-I70.0).   I agree with the radiologist interpretation   Disposition: Presenting suspicious for acute cystitis with hematuria.  Doubt pyelonephritis or nephrolithiasis at this time.  Doubt obstructive uropathy as cause of patient's symptoms. After consideration of the diagnostic results and the patients response to treatment, I feel that the patient would benefit from Discharge home.  Patient will be discharged home with a prescription for Bactrim and instructed to follow-up with her primary care provider regarding today's ED visit.  Supportive care measures and strict  return precautions discussed with patient at bedside. Pt acknowledges and verbalizes understanding. Pt appears safe for discharge. Follow up as indicated in discharge paperwork.    This chart was dictated using voice recognition software, Dragon. Despite the best efforts of this provider to proofread and correct errors, errors may still occur which can change documentation meaning.  Final Clinical Impression(s) / ED Diagnoses Final diagnoses:  Acute cystitis with hematuria    Rx / DC Orders ED Discharge Orders          Ordered    sulfamethoxazole-trimethoprim (BACTRIM DS) 800-160 MG tablet  2 times daily        02/03/22 1127              Heywood Tokunaga A, PA-C 02/03/22 1136    Lennice Sites, DO 02/03/22 1240

## 2022-02-03 NOTE — Discharge Instructions (Addendum)
It was a pleasure taking care of you today!   Your CT scan didn't show any concerning findings today. Your urine was notable for a UTI. You will be sent a prescription for Bactrim, take as prescribed. Ensure that you complete the entire course of antibiotic. Ensure to maintain fluid intake.  You may follow-up with your primary care provider as needed.  Return to the emergency department if you are experiencing increasing or worsening abdominal pain, urinary symptoms, fever, or decreased fluid intake.

## 2022-02-04 LAB — URINE CULTURE

## 2022-02-07 ENCOUNTER — Encounter: Payer: Self-pay | Admitting: Internal Medicine

## 2022-02-07 NOTE — Telephone Encounter (Signed)
Per chart pt was seen 02/03/22 at ED for cystitis.Is this ok??./lmb

## 2022-02-09 ENCOUNTER — Other Ambulatory Visit: Payer: Self-pay | Admitting: Internal Medicine

## 2022-02-09 DIAGNOSIS — N3090 Cystitis, unspecified without hematuria: Secondary | ICD-10-CM

## 2022-03-06 DIAGNOSIS — E119 Type 2 diabetes mellitus without complications: Secondary | ICD-10-CM | POA: Diagnosis not present

## 2022-03-20 ENCOUNTER — Other Ambulatory Visit (HOSPITAL_BASED_OUTPATIENT_CLINIC_OR_DEPARTMENT_OTHER): Payer: Self-pay | Admitting: Internal Medicine

## 2022-03-20 DIAGNOSIS — Z1231 Encounter for screening mammogram for malignant neoplasm of breast: Secondary | ICD-10-CM

## 2022-03-29 ENCOUNTER — Ambulatory Visit (INDEPENDENT_AMBULATORY_CARE_PROVIDER_SITE_OTHER): Payer: PPO | Admitting: Internal Medicine

## 2022-03-29 ENCOUNTER — Encounter: Payer: Self-pay | Admitting: Internal Medicine

## 2022-03-29 VITALS — BP 120/80 | HR 75 | Temp 98.3°F | Ht 70.0 in | Wt 187.0 lb

## 2022-03-29 DIAGNOSIS — G4700 Insomnia, unspecified: Secondary | ICD-10-CM | POA: Diagnosis not present

## 2022-03-29 DIAGNOSIS — F419 Anxiety disorder, unspecified: Secondary | ICD-10-CM | POA: Diagnosis not present

## 2022-03-29 DIAGNOSIS — Z794 Long term (current) use of insulin: Secondary | ICD-10-CM | POA: Diagnosis not present

## 2022-03-29 DIAGNOSIS — E119 Type 2 diabetes mellitus without complications: Secondary | ICD-10-CM | POA: Diagnosis not present

## 2022-03-29 DIAGNOSIS — M2550 Pain in unspecified joint: Secondary | ICD-10-CM | POA: Diagnosis not present

## 2022-03-29 DIAGNOSIS — R0989 Other specified symptoms and signs involving the circulatory and respiratory systems: Secondary | ICD-10-CM | POA: Diagnosis not present

## 2022-03-29 DIAGNOSIS — Z0000 Encounter for general adult medical examination without abnormal findings: Secondary | ICD-10-CM

## 2022-03-29 MED ORDER — ALPRAZOLAM 1 MG PO TABS: ORAL_TABLET | ORAL | 1 refills | Status: DC

## 2022-03-29 NOTE — Progress Notes (Signed)
Subjective:  Patient ID: Catherine Mora, female    DOB: Jul 12, 1950  Age: 72 y.o. MRN: [id number]  CC: Annual Exam   HPI Catherine Mora presents for anxiety, DM, arthralgia Well exam ER notes reviewed    Outpatient Medications Prior to Visit  Medication Sig Dispense Refill   BIOTIN FORTE PO Take 500 mg by mouth daily.     bisacodyl (DULCOLAX) 5 MG EC tablet Take 1 tablet (5 mg total) by mouth daily as needed for moderate constipation. 30 tablet 3   Continuous Blood Gluc Sensor (DEXCOM G6 SENSOR) MISC Inject 1 sensor to the skin every 10 days for continuous glucose monitoring.     Continuous Blood Gluc Transmit (DEXCOM G6 TRANSMITTER) MISC Use as directed for continuous glucose monitoring. Reuse transmitter for 90 days then discard and replace.     DULoxetine (CYMBALTA) 20 MG capsule Take 1 capsule (20 mg total) by mouth 2 (two) times daily. 180 capsule 3   erythromycin ophthalmic ointment Place into both eyes 3 (three) times daily. 3.5 g 0   glimepiride (AMARYL) 2 MG tablet Take 2 mg by mouth every morning.     ibuprofen (ADVIL,MOTRIN) 200 MG tablet Take 400 mg by mouth every 6 (six) hours as needed for headache.     insulin detemir (LEVEMIR) 100 UNIT/ML FlexPen Inject 16 Units into the skin 2 (two) times daily.     Insulin Disposable Pump (OMNIPOD DASH PODS, GEN 4,) MISC SMARTSIG:SUB-Q Every 3 Days     Insulin Pen Needle 31G X 5 MM MISC Use with Levemir pen 100 each 0   lisinopril (ZESTRIL) 10 MG tablet Take 1 tablet by mouth daily.     lovastatin (MEVACOR) 20 MG tablet TAKE 1 TABLET BY MOUTH EVERY DAY 90 tablet 1   nitrofurantoin, macrocrystal-monohydrate, (MACROBID) 100 MG capsule Take 1 capsule (100 mg total) by mouth 2 (two) times daily. 14 capsule 1   NOVOLOG FLEXPEN 100 UNIT/ML FlexPen TAKE AS DIRECTED PER SLIDING SCALE. MAX DAILY DOSE: 50 UNITS/DAY.  3   omeprazole (PRILOSEC) 20 MG capsule Take 1 capsule (20 mg total) by mouth daily. Please schedule an yearly office visit  for further refills. Thank you 90 capsule 0   ONETOUCH VERIO test strip 1 each by Other route 2 (two) times daily.      Polyethyl Glycol-Propyl Glycol (SYSTANE FREE OP) Apply to eye.     ALPRAZolam (XANAX) 1 MG tablet Take 0.5 mg in morning and 1 mg at night prn 135 tablet 1   DULoxetine (CYMBALTA) 30 MG capsule TAKE 1 CAPSULE BY MOUTH EVERY DAY 90 capsule 1   No facility-administered medications prior to visit.    ROS: Review of Systems  Constitutional:  Negative for activity change, appetite change, chills, fatigue and unexpected weight change.  HENT:  Negative for congestion, mouth sores and sinus pressure.   Eyes:  Negative for visual disturbance.  Respiratory:  Negative for cough and chest tightness.   Gastrointestinal:  Negative for abdominal pain and nausea.  Genitourinary:  Negative for difficulty urinating, frequency and vaginal pain.  Musculoskeletal:  Positive for arthralgias. Negative for back pain and gait problem.  Skin:  Negative for pallor and rash.  Neurological:  Negative for dizziness, tremors, weakness, numbness and headaches.  Psychiatric/Behavioral:  Negative for confusion and sleep disturbance.     Objective:  BP 120/80 (BP Location: Right Arm, Patient Position: Sitting, Cuff Size: Normal)   Pulse 75   Temp 98.3 F (36.8 C) (Oral)  Ht '5\' 10"'$  (1.778 m)   Wt 187 lb (84.8 kg)   SpO2 96%   BMI 26.83 kg/m   BP Readings from Last 3 Encounters:  03/29/22 120/80  02/03/22 131/78  12/27/21 118/78    Wt Readings from Last 3 Encounters:  03/29/22 187 lb (84.8 kg)  02/03/22 187 lb (84.8 kg)  12/27/21 191 lb (86.6 kg)    Physical Exam Constitutional:      General: She is not in acute distress.    Appearance: Normal appearance. She is well-developed.  HENT:     Head: Normocephalic.     Right Ear: External ear normal.     Left Ear: External ear normal.     Nose: Nose normal.  Eyes:     General:        Right eye: No discharge.        Left eye: No  discharge.     Conjunctiva/sclera: Conjunctivae normal.     Pupils: Pupils are equal, round, and reactive to light.  Neck:     Thyroid: No thyromegaly.     Vascular: No JVD.     Trachea: No tracheal deviation.  Cardiovascular:     Rate and Rhythm: Normal rate and regular rhythm.     Heart sounds: Normal heart sounds.  Pulmonary:     Effort: No respiratory distress.     Breath sounds: No stridor. No wheezing.  Abdominal:     General: Bowel sounds are normal. There is no distension.     Palpations: Abdomen is soft. There is no mass.     Tenderness: There is no abdominal tenderness. There is no guarding or rebound.  Musculoskeletal:        General: No tenderness.     Cervical back: Normal range of motion and neck supple. No rigidity.  Lymphadenopathy:     Cervical: No cervical adenopathy.  Skin:    Findings: No erythema or rash.  Neurological:     Cranial Nerves: No cranial nerve deficit.     Motor: No abnormal muscle tone.     Coordination: Coordination normal.     Deep Tendon Reflexes: Reflexes normal.  Psychiatric:        Behavior: Behavior normal.        Thought Content: Thought content normal.        Judgment: Judgment normal.    Mild bruit B   Lab Results  Component Value Date   WBC 6.2 01/26/2022   HGB 13.0 01/26/2022   HCT 38.9 01/26/2022   PLT 270.0 01/26/2022   GLUCOSE 153 (H) 01/26/2022   CHOL 221 (H) 01/26/2022   TRIG 159.0 (H) 01/26/2022   HDL 47.60 01/26/2022   LDLDIRECT 219.0 08/14/2014   LDLCALC 142 (H) 01/26/2022   ALT 14 01/26/2022   AST 18 01/26/2022   NA 139 01/26/2022   K 4.4 01/26/2022   CL 102 01/26/2022   CREATININE 1.30 (H) 01/26/2022   BUN 31 (H) 01/26/2022   CO2 26 01/26/2022   TSH 3.40 01/26/2022   HGBA1C 6.9 (H) 03/28/2021   MICROALBUR 1.9 03/28/2021    CT Renal Stone Study  Result Date: 02/03/2022 CLINICAL DATA:  Abdominal/flank pain, stone suspected. Urinary tract infection for 10 days with hematuria. Laterality not  specified. EXAM: CT ABDOMEN AND PELVIS WITHOUT CONTRAST TECHNIQUE: Multidetector CT imaging of the abdomen and pelvis was performed following the standard protocol without IV contrast. RADIATION DOSE REDUCTION: This exam was performed according to the departmental dose-optimization program which includes automated  exposure control, adjustment of the mA and/or kV according to patient size and/or use of iterative reconstruction technique. COMPARISON:  Abdominopelvic CT 08/17/2014. FINDINGS: Lower chest: Mild linear atelectasis or scarring in the right middle lobe and both lung bases. Scattered mild coronary artery calcifications. Hepatobiliary: The liver appears unremarkable as imaged in the noncontrast state. No evidence of significant biliary dilatation status post cholecystectomy. Pancreas: Unremarkable. No pancreatic ductal dilatation or surrounding inflammatory changes. Spleen: Normal in size without focal abnormality. Adrenals/Urinary Tract: Both adrenal glands appear normal. No evidence of urinary tract calculus, suspicious renal lesion or hydronephrosis. The bladder appears unremarkable for its degree of distention. Stomach/Bowel: No enteric contrast administered. The stomach appears unremarkable for its degree of distension. No evidence of bowel wall thickening, distention or surrounding inflammatory change. Suspected previous appendectomy. Mildly prominent stool throughout the colon. Vascular/Lymphatic: There are no enlarged abdominal or pelvic lymph nodes. Mild aortoiliac atherosclerosis. Reproductive: Status post hysterectomy. No evidence of adnexal mass. Other: Soft tissue stranding in the subcutaneous fat of the low anterior abdominal wall which may relate to subcutaneous injections or trauma. No focal fluid collection, hernia or ascites. Musculoskeletal: No acute or significant osseous findings. Multilevel lumbar spondylosis with chronic osseous foraminal narrowing at L5-S1. IMPRESSION: 1. No acute  findings or explanation for the patient's symptoms. Specifically, no evidence of urinary tract calculus or hydronephrosis. 2. Mildly prominent stool throughout the colon. 3. Soft tissue stranding in the subcutaneous fat of the low anterior abdominal wall which may relate to subcutaneous injections or trauma. No focal fluid collection, hernia or ascites. 4. Multilevel lumbar spondylosis with chronic osseous foraminal narrowing at L5-S1. 5.  Aortic Atherosclerosis (ICD10-I70.0). Electronically Signed   By: Richardean Sale M.D.   On: 02/03/2022 11:16    Assessment & Plan:   Problem List Items Addressed This Visit       Endocrine   Type 2 diabetes mellitus without complication ([place]) - Primary    Getting Insulin pump/Dexcom 7        Other   Well adult exam    We discussed age appropriate health related issues, including available/recomended screening tests and vaccinations. Labs were ordered to be later reviewed . All questions were answered. We discussed one or more of the following - seat belt use, use of sunscreen/sun exposure exercise, fall risk reduction, second hand smoke exposure, firearm use and storage, seat belt use, a need for adhering to healthy diet and exercise. Labs were ordered.  All questions were answered. Sch mammo Ophth q 12 mo, PAP q 12 mo Colon 2017      Insomnia disorder    Xanax at hs  Potential benefits of a long term benzodiazepines  use as well as potential risks  and complications were explained to the patient and were aknowledged.      Carotid bruit    Mid B Check duplex      Arthralgia    ?etiology Hold Lovastatin x 1 week       Anxiety    Cont w/prn Xanax  Potential benefits of a long term benzodiazepines  use as well as potential risks  and complications were explained to the patient and were aknowledged.      Relevant Medications   ALPRAZolam (XANAX) 1 MG tablet      Meds ordered this encounter  Medications   ALPRAZolam (XANAX) 1 MG  tablet    Sig: Take 0.5 mg in morning and 1 mg at night prn    Dispense:  135 tablet  Refill:  1      Follow-up: Return in about 6 months (around 09/27/2022) for a follow-up visit.  Walker Kehr, MD

## 2022-03-29 NOTE — Assessment & Plan Note (Signed)
Xanax at hs  Potential benefits of a long term benzodiazepines  use as well as potential risks  and complications were explained to the patient and were aknowledged.

## 2022-03-29 NOTE — Assessment & Plan Note (Signed)
Mid B Check duplex

## 2022-03-29 NOTE — Assessment & Plan Note (Signed)
?  etiology Hold Lovastatin x 1 week

## 2022-03-29 NOTE — Assessment & Plan Note (Signed)
Cont w/prn Xanax  Potential benefits of a long term benzodiazepines  use as well as potential risks  and complications were explained to the patient and were aknowledged.

## 2022-03-29 NOTE — Assessment & Plan Note (Signed)
Getting Insulin pump/Dexcom 7

## 2022-03-29 NOTE — Assessment & Plan Note (Signed)
We discussed age appropriate health related issues, including available/recomended screening tests and vaccinations. Labs were ordered to be later reviewed . All questions were answered. We discussed one or more of the following - seat belt use, use of sunscreen/sun exposure exercise, fall risk reduction, second hand smoke exposure, firearm use and storage, seat belt use, a need for adhering to healthy diet and exercise. Labs were ordered.  All questions were answered. Sch mammo Ophth q 12 mo, PAP q 12 mo Colon 2017

## 2022-04-06 DIAGNOSIS — E119 Type 2 diabetes mellitus without complications: Secondary | ICD-10-CM | POA: Diagnosis not present

## 2022-04-07 ENCOUNTER — Other Ambulatory Visit: Payer: Self-pay | Admitting: Gastroenterology

## 2022-04-07 ENCOUNTER — Other Ambulatory Visit: Payer: Self-pay | Admitting: Internal Medicine

## 2022-04-07 ENCOUNTER — Encounter: Payer: Self-pay | Admitting: Nurse Practitioner

## 2022-04-07 ENCOUNTER — Telehealth (INDEPENDENT_AMBULATORY_CARE_PROVIDER_SITE_OTHER): Payer: PPO | Admitting: Nurse Practitioner

## 2022-04-07 ENCOUNTER — Encounter (INDEPENDENT_AMBULATORY_CARE_PROVIDER_SITE_OTHER): Payer: Self-pay

## 2022-04-07 VITALS — Ht 70.0 in | Wt 185.0 lb

## 2022-04-07 DIAGNOSIS — U071 COVID-19: Secondary | ICD-10-CM | POA: Diagnosis not present

## 2022-04-07 MED ORDER — NIRMATRELVIR/RITONAVIR (PAXLOVID) TABLET (RENAL DOSING): 2.0000 | ORAL_TABLET | Freq: Two times a day (BID) | ORAL | 0 refills | Status: AC

## 2022-04-07 NOTE — Progress Notes (Signed)
MyChart Video Visit    Virtual Visit via Video Note   This visit type was conducted due to national recommendations for restrictions regarding the COVID-19 Pandemic (e.g. social distancing) in an effort to limit this patient's exposure and mitigate transmission in our community. This patient is at least at moderate risk for complications without adequate follow up. This format is felt to be most appropriate for this patient at this time. Physical exam was limited by quality of the video and audio technology used for the visit. CMA was able to get the patient set up on a video visit.  Patient location: Home. Patient and provider in visit Provider location: Office  I discussed the limitations of evaluation and management by telemedicine and the availability of in person appointments. The patient expressed understanding and agreed to proceed.  Visit Date: 04/07/2022  Today's healthcare provider: Tomasita Morrow, NP     Subjective:    Patient ID: Catherine Mora, female    DOB: 08-Oct-1950, 72 y.o.   MRN: [id number]  Chief Complaint  Patient presents with   Covid Positive    HPI Patient reports testing positive for COVID at home yesterday. Her symptoms started on Tuesday afternoon and have been worsening. She is interested in treatment with Paxlovid.   Respiratory illness:  Cough- Yes, productive.   Congestion-    Sinus- Yes   Chest- Yes  Post nasal drip- yes  Sore throat- Yes  Shortness of breath- No  Fever- Low grade initially, has resolved  Fatigue/Myalgia- Yes Headache- Yes Nausea/Vomiting- No Taste disturbance- No  Smell disturbance- No  Covid exposure- No  Covid vaccination- Never  Flu vaccination- UTD  Medications- Advil   Past Medical History:  Diagnosis Date   Allergy    Anxiety    Cataract    starting, no surgery yet   Cholelithiasis 2011   Depression    Diabetes mellitus without complication (HCC)    DKA (diabetic ketoacidoses)    several years ago  and in hospital x 1 week    GERD (gastroesophageal reflux disease)    Glucose intolerance (impaired glucose tolerance)    HTN (hypertension)    Hyperlipidemia    IBS (irritable bowel syndrome)    PONV (postoperative nausea and vomiting)     Past Surgical History:  Procedure Laterality Date   ABDOMINAL HYSTERECTOMY     CHOLECYSTECTOMY N/A 07/03/2012   Procedure: LAPAROSCOPIC CHOLECYSTECTOMY WITH INTRAOPERATIVE CHOLANGIOGRAM;  Surgeon: Zenovia Jarred, MD;  Location: [facility] OR;  Service: General;  Laterality: N/A;   COLONOSCOPY     cyst removed     x3   OOPHORECTOMY     POLYPECTOMY      Family History  Problem Relation Age of Onset   Diabetes Mother    Diabetes Sister    Diabetes Brother    Diabetes Other    Stomach cancer Paternal Uncle    Heart disease Neg Hx    Colon cancer Neg Hx    Esophageal cancer Neg Hx    Rectal cancer Neg Hx    Colon polyps Neg Hx     Social History   Socioeconomic History   Marital status: Married    Spouse name: Not on file   Number of children: 1   Years of education: Not on file   Highest education level: Not on file  Occupational History   Occupation: [employer]    Comment: Wks w/husband  Tobacco Use   Smoking status: Never   Smokeless  tobacco: Never  Vaping Use   Vaping Use: Never used  Substance and Sexual Activity   Alcohol use: Yes    Comment: very little   Drug use: No   Sexual activity: Yes  Other Topics Concern   Not on file  Social History Narrative   Not on file   Social Determinants of Health   Financial Resource Strain: Low Risk  (09/19/2021)   Overall Financial Resource Strain (CARDIA)    Difficulty of Paying Living Expenses: Not hard at all  Food Insecurity: No Food Insecurity (10/11/2021)   Hunger Vital Sign    Worried About Running Out of Food in the Last Year: Never true    Ran Out of Food in the Last Year: Never true  Transportation Needs: No Transportation Needs (10/11/2021)   PRAPARE - Armed forces logistics/support/administrative officer (Medical): No    Lack of Transportation (Non-Medical): No  Physical Activity: Insufficiently Active (09/19/2021)   Exercise Vital Sign    Days of Exercise per Week: 3 days    Minutes of Exercise per Session: 30 min  Stress: No Stress Concern Present (09/19/2021)   [place]    Feeling of Stress : Not at all  Social Connections: Moderately Isolated (09/19/2021)   Social Connection and Isolation Panel [NHANES]    Frequency of Communication with Friends and Family: Three times a week    Frequency of Social Gatherings with Friends and Family: Three times a week    Attends Religious Services: Never    Active Member of Clubs or Organizations: No    Attends Archivist Meetings: Never    Marital Status: Married  Human resources officer Violence: Not At Risk (09/19/2021)   Humiliation, Afraid, Rape, and Kick questionnaire    Fear of Current or Ex-Partner: No    Emotionally Abused: No    Physically Abused: No    Sexually Abused: No    Outpatient Medications Prior to Visit  Medication Sig Dispense Refill   ALPRAZolam (XANAX) 1 MG tablet Take 0.5 mg in morning and 1 mg at night prn 135 tablet 1   bisacodyl (DULCOLAX) 5 MG EC tablet Take 1 tablet (5 mg total) by mouth daily as needed for moderate constipation. 30 tablet 3   DULoxetine (CYMBALTA) 20 MG capsule Take 1 capsule (20 mg total) by mouth 2 (two) times daily. 180 capsule 3   ibuprofen (ADVIL,MOTRIN) 200 MG tablet Take 400 mg by mouth every 6 (six) hours as needed for headache.     insulin detemir (LEVEMIR) 100 UNIT/ML FlexPen Inject 16 Units into the skin 2 (two) times daily.     lisinopril (ZESTRIL) 10 MG tablet Take 1 tablet by mouth daily.     lovastatin (MEVACOR) 20 MG tablet TAKE 1 TABLET BY MOUTH EVERY DAY 90 tablet 1   NOVOLOG FLEXPEN 100 UNIT/ML FlexPen TAKE AS DIRECTED PER SLIDING SCALE. MAX DAILY DOSE: 50 UNITS/DAY.  3   omeprazole  (PRILOSEC) 20 MG capsule Take 1 capsule (20 mg total) by mouth daily. Please schedule an yearly office visit for further refills. Thank you 90 capsule 0   BIOTIN FORTE PO Take 500 mg by mouth daily. (Patient not taking: Reported on 04/07/2022)     Continuous Blood Gluc Sensor (DEXCOM G6 SENSOR) MISC Inject 1 sensor to the skin every 10 days for continuous glucose monitoring.     Continuous Blood Gluc Transmit (DEXCOM G6 TRANSMITTER) MISC Use as directed for continuous  glucose monitoring. Reuse transmitter for 90 days then discard and replace.     erythromycin ophthalmic ointment Place into both eyes 3 (three) times daily. (Patient not taking: Reported on 04/07/2022) 3.5 g 0   glimepiride (AMARYL) 2 MG tablet Take 2 mg by mouth every morning.     Insulin Disposable Pump (OMNIPOD DASH PODS, GEN 4,) MISC SMARTSIG:SUB-Q Every 3 Days     Insulin Pen Needle 31G X 5 MM MISC Use with Levemir pen 100 each 0   nitrofurantoin, macrocrystal-monohydrate, (MACROBID) 100 MG capsule Take 1 capsule (100 mg total) by mouth 2 (two) times daily. 14 capsule 1   ONETOUCH VERIO test strip 1 each by Other route 2 (two) times daily.      Polyethyl Glycol-Propyl Glycol (SYSTANE FREE OP) Apply to eye.     No facility-administered medications prior to visit.    Allergies  Allergen Reactions   Metformin Other (See Comments)    Hair loss   Ciprofloxacin Hives    REACTION: itching   Crestor [Rosuvastatin Calcium] Other (See Comments)    arthralgia   Hydrochlorothiazide Other (See Comments)    REACTION: hair loss   Invokana [Canagliflozin] Other (See Comments)    ketoacidosis   Paroxetine Other (See Comments)    REACTION: living in glass   Penicillins Hives   Simvastatin Other (See Comments)    REACTION: aches with statins    ROS     Objective:    Physical Exam  Ht 5' 10"$  (1.778 m)   Wt 185 lb (83.9 kg) Comment: Pt reported  BMI 26.54 kg/m  Wt Readings from Last 3 Encounters:  04/07/22 185 lb (83.9 kg)   03/29/22 187 lb (84.8 kg)  02/03/22 187 lb (84.8 kg)   GENERAL: alert, oriented, appears well and in no acute distress   HEENT: atraumatic, conjunttiva clear, no obvious abnormalities on inspection of external nose and ears   NECK: normal movements of the head and neck   LUNGS: on inspection no signs of respiratory distress, breathing rate appears normal, no obvious gross SOB, gasping or wheezing   CV: no obvious cyanosis   MS: moves all visible extremities without noticeable abnormality   PSYCH/NEURO: pleasant and cooperative, no obvious depression or anxiety, speech and thought processing grossly intact    Assessment & Plan:   Problem List Items Addressed This Visit       Other   COVID-19 - Primary    Treating with Paxlovid renal dosing. Discussed with patient need for renal dosing due to decreased kidney function. GFR in December- 41.2. Counseled patient on risks and benefits of antiviral therapy, counseled on side effects of medication. Advised to stop medication if adverse reaction occurs. Encouraged adequate hydration. Advised patient to stop her Lovastatin while taking Paxlovid and not to resume for 5 days after completing medication. Counseled on quarantine protocol. Return precautions discussed with patient.       Relevant Medications   nirmatrelvir/ritonavir, renal dosing, (PAXLOVID) 10 x 150 MG & 10 x 100MG TABS    I am having Catherine Mora. Opfer start on nirmatrelvir/ritonavir (renal dosing). I am also having her maintain her ibuprofen, Insulin Pen Needle, OneTouch Verio, BIOTIN FORTE PO, bisacodyl, NovoLOG FlexPen, Polyethyl Glycol-Propyl Glycol (SYSTANE FREE OP), insulin detemir, lisinopril, omeprazole, glimepiride, DULoxetine, lovastatin, erythromycin, nitrofurantoin (macrocrystal-monohydrate), Dexcom G6 Sensor, Dexcom G6 Transmitter, Omnipod DASH Pods (Gen 4), and ALPRAZolam.  Meds ordered this encounter  Medications   nirmatrelvir/ritonavir, renal dosing,  (PAXLOVID) 10 x 150 MG & 10  x 100MG TABS    Sig: Take 2 tablets by mouth 2 (two) times daily for 5 days. (Take nirmatrelvir 150 mg one tablet twice daily for 5 days and ritonavir 100 mg one tablet twice daily for 5 days) Patient GFR is 41.2    Dispense:  20 tablet    Refill:  0    Order Specific Question:   Supervising Provider    Answer:   Caryl Bis, ERIC G [[id number]]    I discussed the assessment and treatment plan with the patient. The patient was provided an opportunity to ask questions and all were answered. The patient agreed with the plan and demonstrated an understanding of the instructions.   The patient was advised to call back or seek an in-person evaluation if the symptoms worsen or if the condition fails to improve as anticipated.  Tomasita Morrow, NP [place] at [facility] [phone] (phone) [phone] (fax)  [place]

## 2022-04-07 NOTE — Assessment & Plan Note (Signed)
Treating with Paxlovid renal dosing. Discussed with patient need for renal dosing due to decreased kidney function. GFR in December- 41.2. Counseled patient on risks and benefits of antiviral therapy, counseled on side effects of medication. Advised to stop medication if adverse reaction occurs. Encouraged adequate hydration. Advised patient to stop her Lovastatin while taking Paxlovid and not to resume for 5 days after completing medication. Counseled on quarantine protocol. Return precautions discussed with patient.

## 2022-04-11 DIAGNOSIS — Z794 Long term (current) use of insulin: Secondary | ICD-10-CM | POA: Diagnosis not present

## 2022-04-11 DIAGNOSIS — E119 Type 2 diabetes mellitus without complications: Secondary | ICD-10-CM | POA: Diagnosis not present

## 2022-04-14 DIAGNOSIS — E1165 Type 2 diabetes mellitus with hyperglycemia: Secondary | ICD-10-CM | POA: Diagnosis not present

## 2022-04-14 DIAGNOSIS — Z794 Long term (current) use of insulin: Secondary | ICD-10-CM | POA: Diagnosis not present

## 2022-04-25 DIAGNOSIS — Z9641 Presence of insulin pump (external) (internal): Secondary | ICD-10-CM | POA: Diagnosis not present

## 2022-04-25 DIAGNOSIS — E119 Type 2 diabetes mellitus without complications: Secondary | ICD-10-CM | POA: Diagnosis not present

## 2022-04-25 DIAGNOSIS — Z794 Long term (current) use of insulin: Secondary | ICD-10-CM | POA: Diagnosis not present

## 2022-04-25 DIAGNOSIS — Z7984 Long term (current) use of oral hypoglycemic drugs: Secondary | ICD-10-CM | POA: Diagnosis not present

## 2022-05-04 DIAGNOSIS — E119 Type 2 diabetes mellitus without complications: Secondary | ICD-10-CM | POA: Diagnosis not present

## 2022-05-04 DIAGNOSIS — Z794 Long term (current) use of insulin: Secondary | ICD-10-CM | POA: Diagnosis not present

## 2022-05-05 ENCOUNTER — Other Ambulatory Visit: Payer: Self-pay | Admitting: Internal Medicine

## 2022-05-05 DIAGNOSIS — E119 Type 2 diabetes mellitus without complications: Secondary | ICD-10-CM | POA: Diagnosis not present

## 2022-05-09 ENCOUNTER — Encounter (INDEPENDENT_AMBULATORY_CARE_PROVIDER_SITE_OTHER): Payer: Self-pay | Admitting: Physician Assistant

## 2022-05-09 ENCOUNTER — Ambulatory Visit (INDEPENDENT_AMBULATORY_CARE_PROVIDER_SITE_OTHER): Payer: Medicare PPO | Admitting: Physician Assistant

## 2022-05-09 VITALS — BP 124/84 | HR 80 | Temp 98.0°F | Resp 16 | Ht 68.0 in | Wt 108.0 lb

## 2022-05-09 DIAGNOSIS — R42 Dizziness and giddiness: Secondary | ICD-10-CM

## 2022-05-09 LAB — CBC, DIFF
% Basophils: 0 %
% Eosinophils: 5 %
% Immature Granulocytes: 0 %
% Lymphocytes: 31 %
% Monocytes: 10 %
% Neutrophils: 54 %
% Nucleated RBC: 0 %
Absolute Eosinophil Count: 0.26 10*3/uL (ref 0.00–0.50)
Absolute Lymphocyte Count: 1.6 10*3/uL (ref 1.00–4.80)
Basophils: 0.02 10*3/uL (ref 0.00–0.20)
Hematocrit: 41 % (ref 36.0–45.0)
Hemoglobin: 13.5 g/dL (ref 11.5–15.5)
Immature Granulocytes: 0.01 10*3/uL (ref 0.00–0.05)
MCH: 31.8 pg (ref 27.3–33.6)
MCHC: 32.6 g/dL (ref 32.2–36.5)
MCV: 98 fL (ref 81–98)
Monocytes: 0.54 10*3/uL (ref 0.00–0.80)
Neutrophils: 2.75 10*3/uL (ref 1.80–7.00)
Nucleated RBC: 0 10*3/uL
Platelet Count: 206 10*3/uL (ref 150–400)
RBC: 4.24 10*6/uL (ref 3.80–5.00)
RDW-CV: 12.2 % (ref 11.0–14.5)
WBC: 5.18 10*3/uL (ref 4.3–10.0)

## 2022-05-09 NOTE — Progress Notes (Signed)
[facility] OUTPATIENT CL[facility]IC NOTE    Interpreter needed? No    Subjective:     Chief Complaint   Patient presents with    Blood Pressure     Patient is light-headed x 1-2 month, has gotten worse in the last few days, but lightens up as day goes on.     Paula Bowman is a 72 year old female w/ a h/o Atrial Tachycardia (followed by [facility]), Breast CA (CA-free x ~14 yrs), and Brain Aneurysm (followed be [facility], stable on MRIs, per pt) who presents c/o:    # Lightheadedness x 1-2 mo, worse over the last 3-4 days    Pt notes episodic lightheadedness "like my head is stuffed-up," most noticeable in the early AM after waking up. Denies dizziness. Episodes of lightheadedness last 2-3 hrs.  Episodes are not a/w CP/SOB  She reports she has had this evaluated numerous times over the years w/ various specialists "who haven't been able to find anything."   Feels better after having a cup of caffeinated tea.  Pt walks 2 miles on her treadmill daily, denies DOE or lightheadedness a/w exercises  Saw her Neurosurgeon yesterday via [facility], was advised to check her BP daily  Checked her BP yesterday AM when she first woke up  This AM's BP was 86/52 mmHg, 86/42 mmHg after getting off her treadmill. Of note, she reports she feels no lightheadedness while on her treadmill  Her average is 90-110/50's mmHg  She was previously on Metoprolol for PACs, but felt quite depressed while on it, and self D/C'd ~1 mo ago  She was evaluated in the ER in October '23- at that TSH was ~5.5, which was double her previous baseline (which ranged 1.7-2.4). T4 was nl.    I have reviewed the patient's recorded medical history, medications, problem list, and allergies    ROS: Per HPI    Objective:   BP 124/84   Pulse 80   Temp 36.7 C   Resp 16   Ht  (1.727 m)   Wt 49 kg (108 lb)   SpO2 100%   BMI 16.42 kg/m     ORTHOSTATICS:  Supine: 145/90 mmHg  Sitting: 138/87 mmHg  Standing: 124/84 mmHg    GEN: No acute distress, alert.    HEENT: Normocephalic, atraumatic. MMM/AS.    B/L TMs w/o erythema, edema, or effusions.  NEURO: CN II-XII grossly intact w/o focal deficits    HINTS exam:  - Head Impulse: corrective nystagmus is noted  - Nystagmus: smooth ocular pursuit is noted b/l, w/o saccadic pursuit b/l  no direction-changing nystagmus is noted b/l  - Test of Skew: no vertical gave noted on cover/uncover test to either eye    - Dix-Hallpike Maneuver: no dizziness or nystagmus is elicited with the head tilted b/l.  - Epley Maneuver: not attempted    CV: RRR, S1S2 without m/r/g. No JVD.  CHEST: Nml respiratory rate and effort. LCTAB without r/r/w.    NEC: FROM, supple. No thyromegaly/masses. No palpable nodules noted. No bruit.  EXT: No cyanosis or edema.  SKIN: Warm, dry and intact.     MSK: No gross range of motion deficits. Ambulating w/o assistance.     EKG  My read indicates NSR at 67 bpm, normal axis, without ST segment or T wave changes.  Good anterior R wave progression. Normal intervals. When compared to previous EKG from 11/07/21:  PACs are no longer present.    Assessment and Plan:   #  72 year old female with:    1. Lightheadedness   VSS, afebrile. HD Stable. EKG w/o arrhythmia or e/o ischemia. No CP/SOB. No focal neuro deficits. Given markedly elevated TSH (compared to her baseline) from October, Hypothyroidism would be reasonable to R/O. Will check TSH w/ reflex T4 today- possible subclinical hypothyroidism. Will also check Anti-TG Ab for r/o Hashimoto's. Will also check CBC, CMP to r/o electrolyte derangement.   - EKG 12-Lead; Future  - Comprehensive Metabolic Panel  - Anti Thyroglobulin  - CBC with Diff  - TSH w/Reflexive Free T4    - We will contact you in 12-24 hours with your lab results    - Please FOLLOW-UP with your PCP on 06/02/22, as plannned    Please MONITOR your symptoms closely. If you or a loved one notice you begin acting "not quite yourself," or you have sudden, severe headache, new/worsening difficulty speaking, or  coordinating movements, sudden, severe sleepiness, or vomiting more than twice, or another head injury over the next 24 hrs, please proceed DIRECTLY to the ER for further evaluation and management     - Lynnell Chad, MSPA, PA-C  [facility]    Patient Active Problem List   Diagnosis    Anxiety state    Other chronic allergic conjunctivitis    GERD (gastroesophageal reflux disease)    Eczema    Allergic rhinitis    Onychomycosis    Back pain    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     Neck pain    Need for prophylactic vaccination with combined diphtheria-tetanus-pertussis (DTP) vaccine    Radiculopathy of cervical region    Heat exhaustion    Dry eye    Malignant neoplasm of breast (HCC)    Malignant neoplasm of skin    Microscopic colitis    Brain aneurysm    Atrial tachycardia, paroxysmal     Current Outpatient Medications   Medication Instructions    albuterol HFA (ProAir HFA) 108 (90 Base) MCG/ACT inhaler 2 puffs, Inhalation, 3 times daily PRN    ascorbic acid (VITAMIN C) 500 mg, Oral    famotidine 10 MG tablet Oral    magnesium oxide (MAG-OX) 400 mg, Oral    Multiple Vitamin (Multi-Vitamin) tablet 1 tablet, Oral    naproxen (ALEVE) 440 mg, Oral, Every 12 hours PRN    simvastatin (ZOCOR) 5 mg, Oral, Daily, Current dose from 05/19/21    triamcinolone 0.5 % ointment Use twice a day for 2 weeks    VITAMIN D OR 1 tab(s), PO, Daily, # 30 tab(s)     I spent a total time of 40 minutes face-to-face with the patient, of which more than 50% was spent counseling and coordinating care as outlined in this note.

## 2022-05-09 NOTE — Patient Instructions (Addendum)
-   We will contact you in 12-24 hours with your lab results    - Please FOLLOW-UP with your PCP on 06/02/22, as plannned    Please MONITOR your symptoms closely. If you or a loved one notice you begin acting "not quite yourself," or you have sudden, severe headache, new/worsening difficulty speaking, or coordinating movements, sudden, severe sleepiness, or vomiting more than twice, or another head injury over the next 24 hrs, please proceed DIRECTLY to the ER for further evaluation and management

## 2022-05-09 NOTE — Progress Notes (Signed)
Medications, allergies, vitals verified by Aneesha Holloran/CMA  Declines HM

## 2022-05-10 LAB — EKG 12 LEAD
Atrial Rate: 67 {beats}/min
P Axis: 79 degrees
P-R Interval: 112 ms
Q-T Interval: 390 ms
QRS Duration: 88 ms
QTC Calculation: 412 ms
R Axis: 47 degrees
T Axis: 72 degrees
Ventricular Rate: 67 {beats}/min

## 2022-05-10 LAB — TSH WITH REFLEXIVE FREE T4: Thyroid Stimulating Hormone: 3.483 u[IU]/mL (ref 0.400–5.000)

## 2022-05-10 LAB — COMPREHENSIVE METABOLIC PANEL
ALT (GPT): 13 U/L (ref 7–33)
AST (GOT): 20 U/L (ref 9–38)
Albumin: 4.5 g/dL (ref 3.5–5.2)
Alkaline Phosphatase (Total): 45 U/L (ref 38–172)
Anion Gap: 10 (ref 4–12)
Bilirubin (Total): 0.7 mg/dL (ref 0.2–1.3)
Calcium: 10.2 mg/dL (ref 8.9–10.2)
Carbon Dioxide, Total: 27 meq/L (ref 22–32)
Chloride: 104 meq/L (ref 98–108)
Creatinine: 0.73 mg/dL (ref 0.38–1.02)
Glucose: 86 mg/dL (ref 62–125)
Potassium: 4.8 meq/L (ref 3.6–5.2)
Protein (Total): 7.1 g/dL (ref 6.0–8.2)
Sodium: 141 meq/L (ref 135–145)
Urea Nitrogen: 20 mg/dL (ref 8–21)
eGFR by CKD-EPI 2021: >60 mL/min/{1.73_m2} (ref >59)

## 2022-05-10 LAB — THYROGLOBULIN ANTIBODY (AUTOIMMUNE HYPOTHYROIDISM): Anti thyroglobulin: <1 [IU]/mL (ref 0.0–3.9)

## 2022-05-10 NOTE — Result Encounter Note (Signed)
Hey Dr Roxanna Mew and Cordelia Poche Drusilla Kanner,    I saw a mutual pt in UC yesterday who had an elevated TSH in October (~5.5) and ongoing s/sx of fatigue/lightheadedness, which is why we opted to repeat TSH. It is WNL (3.48) but elevated relative to her prior baseline. Per guidelines- it doesn't look like she'd meet criteria for subclinical hypothyroidism, but wanted to loop you in and see if either of you might have availability to F/U w/ her for further W/U? She is eager to get to the bottom of her lightheadedness as it has worsened over the last few weeks.    Thanks!   - Lynnell Chad, MSPA, PA-C

## 2022-05-15 ENCOUNTER — Telehealth (INDEPENDENT_AMBULATORY_CARE_PROVIDER_SITE_OTHER): Payer: Self-pay | Admitting: Internal Medicine

## 2022-05-15 NOTE — Telephone Encounter (Signed)
Please call the patient and inform , I reviewed her UC visit and labs and we will need to have follow up appointment   Please help  scheduling with me or Lorinda Creed, MD

## 2022-05-15 NOTE — Telephone Encounter (Signed)
Pt have upcoming app in April 12 th so she will keep that app and will discuss her issues at the same time with Renee. Closing TE

## 2022-05-25 ENCOUNTER — Ambulatory Visit
Admission: RE | Admit: 2022-05-25 | Discharge: 2022-05-25 | Disposition: A | Discharge status: Home / Self Care | Payer: Medicare PPO | Attending: Diagnostic Radiology | Admitting: Diagnostic Radiology

## 2022-05-25 ENCOUNTER — Encounter (HOSPITAL_BASED_OUTPATIENT_CLINIC_OR_DEPARTMENT_OTHER): Payer: Self-pay

## 2022-05-25 DIAGNOSIS — Z1231 Encounter for screening mammogram for malignant neoplasm of breast: Secondary | ICD-10-CM | POA: Insufficient documentation

## 2022-05-31 ENCOUNTER — Other Ambulatory Visit (INDEPENDENT_AMBULATORY_CARE_PROVIDER_SITE_OTHER): Payer: Self-pay | Admitting: Medical

## 2022-05-31 DIAGNOSIS — E782 Mixed hyperlipidemia: Secondary | ICD-10-CM

## 2022-06-02 ENCOUNTER — Encounter (INDEPENDENT_AMBULATORY_CARE_PROVIDER_SITE_OTHER): Payer: Self-pay | Admitting: Medical

## 2022-06-02 ENCOUNTER — Ambulatory Visit (INDEPENDENT_AMBULATORY_CARE_PROVIDER_SITE_OTHER): Payer: Medicare PPO | Admitting: Medical

## 2022-06-02 VITALS — BP 113/76 | HR 84 | Temp 97.6°F | Resp 16 | Ht 68.0 in | Wt 111.0 lb

## 2022-06-02 DIAGNOSIS — R42 Dizziness and giddiness: Secondary | ICD-10-CM

## 2022-06-02 DIAGNOSIS — I671 Cerebral aneurysm, nonruptured: Secondary | ICD-10-CM

## 2022-06-02 DIAGNOSIS — M1610 Unilateral primary osteoarthritis, unspecified hip: Secondary | ICD-10-CM | POA: Insufficient documentation

## 2022-06-02 DIAGNOSIS — Z0001 Encounter for general adult medical examination with abnormal findings: Secondary | ICD-10-CM

## 2022-06-02 DIAGNOSIS — I4719 Other supraventricular tachycardia: Secondary | ICD-10-CM

## 2022-06-02 DIAGNOSIS — E7800 Pure hypercholesterolemia, unspecified: Secondary | ICD-10-CM

## 2022-06-02 MED ORDER — SIMVASTATIN 10 MG OR TABS
5.0000 mg | ORAL_TABLET | Freq: Every day | ORAL | 3 refills | Status: DC
Start: 2022-06-02 — End: 2022-10-05

## 2022-06-02 NOTE — Progress Notes (Signed)
Patient roomed by Virgel Paling, CMA     History, medications & allergies reviewed    [place]/Care Everywhere/[facility] have been reconciled in Epic: YES    Reviewed eCare status with Patient:  YES    Reason for visit:   - Wellness   Medicare      HEALTH MAINTENANCE:  Has the patient had any of these since their last visit?          HM Due:   Health Maintenance   Topic Date Due    Medicare Annual Wellness Visit  05/20/2022    Depression Screening (PHQ-2)  06/01/2023    DTaP, Tdap and Td Vaccines (2 - Td or Tdap) 11/28/2023    Breast Cancer Screening  05/24/2024    Colorectal Cancer Screening  09/14/2024    Lipid Disorders Screening  03/17/2026    Osteoporosis Screening  Completed    Pneumococcal Vaccine: 65+ years  Completed    Zoster Vaccine  Completed    Influenza Vaccine  Completed    COVID-19 Vaccine  Completed    Hepatitis C Screening  Addressed    Hepatitis A Vaccine  Aged Out    Hepatitis B Vaccine  Aged Out    HPV Vaccine  Aged Out         Answers submitted by the patient for this visit:  Health Screening: Depression (Submitted on 06/01/2022)  PHQ2 Score: 0

## 2022-06-02 NOTE — Patient Instructions (Addendum)
?   CTA head and neck for lightheadedness.     Based on today's evaluation, I recommend the following ways to improve your health or functioning:    Continue  Eating well balanced meals, high in fiber, low in cholesterol and low in fat, limiting simple sugars.   2.   Moderate exercise for 30 minutes 5 days a week. Examples brisk walking, swimming or bicycling.   3.   Getting plenty of rest 7-8 hours per night.   4.   Drinking 64oz of water per day.   5.   Keeping regular appointments for Eye, Dental and Wellness exams.   6.   Wear sunscreen or UV protective clothing and hats.

## 2022-06-02 NOTE — Progress Notes (Signed)
ANNUAL WELLNESS VISIT        Paula Bowman is a 72 year old female who presents for a subsequent Annual Wellness Visit.    Is this a Telemedicine or a Phone visit?  No       INFORMATION GATHERING:      The following areas were confirmed with patient/caregiver and/or updated in Epic at this visit:   Past Medical, Surgical, Family, and Social History  Current medications and supplements  Allergies  All of the above components have been reviewed and updated: yes    Opioid Use:   Patient not taking opioids       I have not reviewed the patient's risk factors for other substance use disorders.  If appropriate, I've referred the patient for treatment.      Please see today's HRA for review of patient's functional ability and level of safety. Concerns are addressed below in the Personalized Prevention Plan section.    For list of current providers and suppliers, see Care Team Section, EHR encounters, and/or HRA questionnaire for [facility] providers involved in care    Other providers and suppliers outside [facility]:   Identified outside providers not included in records above (please specify): cardiology  Dr Carleene Mains at [facility] for AT, started metoprolol , states not working, increase depression, stopped.     C/o lightheadedness persists, getting up like truck ran over her, has cup of tea, walks on treadmill for couple of miles but feeling like passing out.   Taking daily nap, feeling better afterward.     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   Wrist surgery planned for OA.       Depression screening:    PHQ-2: 0  PHQ-9 (if done):       EXAM:      BP 113/76   Pulse 84   Temp 36.4 C (Temporal)   Resp 16   Ht  (1.727 m)   Wt 50.3 kg (111 lb)   SpO2 98%   BMI 16.88 kg/m       Alert and oriented in NAD but upset (states with PCP care)  Neuro CN II-XII intact and symmetric, strength and sensation to light touch intact and symmetric in all 4 extremities  Rhomberg negative.      GAIT:   Normal, stable, independent    COGNITION:       Intact          ADVANCE CARE PLANNING (ACP)       Advance care planning:  Offered, patient accepted.  Time spent on ACP: 10 min  POLST form filled out, pt to fill in rest and return for scanning.     Explained & discussed advance directives at this visit  Completed advance care planning form(s) at this visit    ASSESSMENT:       Paula Bowman was seen for her Annual Wellness Visit, including identification of risk factors & conditions that may affect her health and function in the future.         Bricelyn was seen today for wellness.    Encounter for well adult exam with abnormal findings    Hypercholesteremia  -     Lipid Panel; Future    Atrial tachycardia, paroxysmal (HCC)  Comments:  palp at hs mostly, not assoc with lightheadedness  Orders:  -     TSH with Reflexive Free T4; Future    Lightheadedness  Comments:  CTA head/neck, caution  with movement, f/u neuro  Orders:  -     Magnesium  -     Folate and Vitamin B12; Future    Cerebral aneurysm    Episodic lightheadedness  -     CTA Head and Neck Angio; Future      Actions at this visit:      Established or updated a written schedule of screening and prevention measures recommended and appropriatLahari Suttles Magan for the next 5-10 years  Established or updated a list of her risk factors and conditions for which lifestyle or medical interventions are recommended or underway, including mental health risks and conditions, and including risks/benefits of treatment  Furnished personalized health advice and, as appropriate, referrals to health education or preventive counseling services or programs (such as fall prevention, tobacco cessation, physical activity, nutrition, cognition, weight loss)    All of the above components have been reviewed and updated. yes    Personalized Prevention Plan:      Based on today's evaluation, I recommend the following ways to improve your health or  functioning.    You have the following risk factors and/or medical conditions for which there are recommended ways (included in this list) to help you stay as healthy as possible:  Based on today's evaluation, I recommend the following ways to improve your health or functioning:    Continue  Eating well balanced meals, high in fiber, low in cholesterol and low in fat, limiting simple sugars.   2.   Moderate exercise for 30 minutes 5 days a week. Examples brisk walking, swimming or bicycling.   3.   Getting plenty of rest 7-8 hours per night.   4.   Drinking 64oz of water per day.   5.   Keeping regular appointments for Eye, Dental and Wellness exams.   6.   Wear sunscreen or UV protective clothing and hats.       Here are screening & prevention measures recommended for you:  Health Maintenance   Topic Date Due    Depression Screening (PHQ-2)  06/01/2023    Medicare Annual Wellness Visit  06/02/2023    DTaP, Tdap and Td Vaccines (2 - Td or Tdap) 11/28/2023    Breast Cancer Screening  05/24/2024    Colorectal Cancer Screening  09/14/2024    Lipid Disorders Screening  03/17/2026    Osteoporosis Screening  Completed    Pneumococcal Vaccine: 65+ years  Completed    Zoster Vaccine  Completed    Influenza Vaccine  Completed    COVID-19 Vaccine  Completed    Hepatitis C Screening  Addressed    Hepatitis A Vaccine  Aged Out    Hepatitis B Vaccine  Aged Out    HPV Vaccine  Aged Out       Please plan to have a Subsequent Annual Wellness Visit in 1 year.    Answers submitted by the patient for this visit:  Health Screening: Depression (Submitted on 06/01/2022)  PHQ2 Score: 0    Clovis Pu, PA-C

## 2022-06-05 DIAGNOSIS — E119 Type 2 diabetes mellitus without complications: Secondary | ICD-10-CM | POA: Diagnosis not present

## 2022-06-09 ENCOUNTER — Other Ambulatory Visit (INDEPENDENT_AMBULATORY_CARE_PROVIDER_SITE_OTHER): Payer: Medicare PPO

## 2022-06-12 ENCOUNTER — Other Ambulatory Visit: Payer: Self-pay | Admitting: Internal Medicine

## 2022-06-13 ENCOUNTER — Other Ambulatory Visit (INDEPENDENT_AMBULATORY_CARE_PROVIDER_SITE_OTHER): Payer: Medicare PPO

## 2022-06-13 DIAGNOSIS — I4719 Other supraventricular tachycardia: Secondary | ICD-10-CM

## 2022-06-13 DIAGNOSIS — E7800 Pure hypercholesterolemia, unspecified: Secondary | ICD-10-CM

## 2022-06-13 DIAGNOSIS — R42 Dizziness and giddiness: Secondary | ICD-10-CM

## 2022-06-13 LAB — MAGNESIUM: Magnesium: 2 mg/dL (ref 1.8–2.4)

## 2022-06-13 LAB — LIPID PANEL
Cholesterol/HDL Ratio: 2.3
HDL Cholesterol: 81 mg/dL (ref >39)
LDL Cholesterol, NIH Equation: 92 mg/dL (ref <130)
Non-HDL Cholesterol: 103 mg/dL (ref 0–159)
Total Cholesterol: 184 mg/dL (ref <200)
Triglyceride: 60 mg/dL (ref <150)

## 2022-06-13 LAB — TSH WITH REFLEXIVE FREE T4: Thyroid Stimulating Hormone: 3.019 u[IU]/mL (ref 0.400–5.000)

## 2022-06-13 LAB — FOLATE & VITAMIN B12
Folate, SRM: >22 ng/mL (ref >5.8)
Vitamin B12 (Cobalamin): 919 pg/mL — ABNORMAL HIGH (ref 180–914)

## 2022-06-14 ENCOUNTER — Encounter (INDEPENDENT_AMBULATORY_CARE_PROVIDER_SITE_OTHER): Payer: Self-pay | Admitting: Medical

## 2022-06-14 NOTE — Result Encounter Note (Signed)
Reviewed results, all normal limits.

## 2022-06-14 NOTE — Result Encounter Note (Signed)
Reviewed results, mag normal limits.

## 2022-06-15 NOTE — Telephone Encounter (Signed)
Relayed patient's lipid results for some reason these results weren't included in patient message from [place].  Patient gave her understanding and Nothing further to do with this TE, closing

## 2022-07-05 DIAGNOSIS — E119 Type 2 diabetes mellitus without complications: Secondary | ICD-10-CM | POA: Diagnosis not present

## 2022-07-13 ENCOUNTER — Other Ambulatory Visit: Payer: Self-pay | Admitting: Gastroenterology

## 2022-07-13 ENCOUNTER — Other Ambulatory Visit: Payer: Self-pay | Admitting: Internal Medicine

## 2022-07-14 ENCOUNTER — Encounter (INDEPENDENT_AMBULATORY_CARE_PROVIDER_SITE_OTHER): Payer: Self-pay | Admitting: Medical

## 2022-07-14 NOTE — Progress Notes (Signed)
ELECTROPHYSIOLOGY CLINIC FOLLOW-UP    PRIMARY CARE PHYSICIAN: Avie Echevaria, MD  EP cardiologist: Dr. Jarvis Newcomer   Cardiologist:   CHIEF COMPLAINT: Atrial tachycardia    IDENTIFYING DATA: This is 72 y.o. female patient with problems listed below.     Subjective  Paula Bowman is here for a follow up due to concerns.   She is having ongoing lightheadedness mostly when she is recovering from exercise on treadmill. She has not checked her heart rate at that time. She is not aware of heart rate at the time of lightheadedness.     She requested ?CT angiogram to her PCP due to ongoing lightheadedness to evaluate for stenosis. She had the test done on Monday and now is waiting for the result.     She also is having a surgery in July to address her brain aneurysm. This is going to be a percutaneous clipping.     She stopped taking metoprolol succinate 25mg . She used to take 12.5mg  then it was increased to once a day. But it made her feel worse. She had depression and became very moody.       Problem List Items Addressed This Visit       PAT (paroxysmal atrial tachycardia) (CMS-HCC) - Primary     Other Visit Diagnoses       Atrial tachycardia (CMS-HCC)              Impression: it is possible that she is having AT during her recovery from exercise, given fastest heart rate with AT >200 bpm on her CAM. But without HR measurement or rhythm strips, cannot be confirmed. ECG today shows sinus rhythm with normal interval and no PAC or AT.    Encouraged to start metoprolol succinate 12.5mg  once a day as she had side effects with 25mg  daily.     Overall, much reassurance was provided.     Plans:   - continue metoprolol succinate 12.5mg  once daily   - ok with percutaneous clipping of aneurysm in 08/2022 (normal ECG, normal heart structure by echo)     Flu    Follow-Up: Return for 6 month follow up with Dr. Jarvis Newcomer .    ---------------------------------------------------------------------------------------  Objective  Interval Hx: Dr.  Jarvis Newcomer was consulted initially 12/20/21 for lightheadedness/off feeling and found to have paroxysmal AT. An ambulatory monitor showed 1500 episodes of AT up to 20 beats 110-140 bpm up to 223 bpm. Managed with metoprolol succinate.  Normal heart structure by echo 02/2022.     Last seen 03/15/22 by Dr. Jarvis Newcomer. Doing relatively well on metoprolol.     Other relevant medical hx     Family history:  family history includes Breast cancer in her paternal aunt; Breast cancer (age of onset: 56) in her sister.    Allergy/Intolerances:     ALLERGIES   Allergen Reactions   . Amoxicillin Rash   . Levaquin [levofloxacin] Itching     Redness up arm from IV dose   . Pcn [penicillins] Rash       The patient is  height is 5\' 8"  (172.7 cm) and weight is 51.3 kg (113 lb). Her temporal temperature is 36.3 C (97.3 F). Her blood pressure is 109/53 and her pulse is 63. Her respiration is 16 and oxygen saturation is 100%.  The Body mass index is 17.18 kg/m.Marland Kitchen   BP Readings from Last 3 Encounters:   07/14/22 109/53   03/15/22 92/54   01/19/22 102/54     Wt Readings from  Last 5 Encounters:   07/14/22 51.3 kg (113 lb)   03/15/22 51.1 kg (112 lb 11.2 oz)   01/19/22 49 kg (108 lb)   12/20/21 50.8 kg (112 lb 1.6 oz)   03/20/19 51.7 kg (114 lb)       General: Patient is well appearing, interactive and appropriate with fluent speech.  Lung: Normal respiratory effort and clear bilaterally.  Chest/Cardiovascular: Rate normal, regular rhythm. Normal s1 and s2. No murmurs. No s3/s4. No lifts/heaves. Non-displaced PMI.  Abdomen: Soft, non-tender, no hepatosplenomegaly, masses or ascites.  Extremities: Warm, 2+ distal pulses, no LE edema.  Neurologic: Alert and appropriate without focal deficit noted.    Data Reviewed:     EKG  03/16/22 sinus 59 bpm, PR , QRSd 93ms, Qtc , normal axis.   07/14/22 sinus 63 bpm, PR , QRSd 93ms, Qtc , normal axis.     Cardiac monitor   Bear 11/2021 sinus, AT x1500 episodes, up to 20 beats at 109 bpm up  to 140, fastest 6 beats at 191 bpm up to 223 bpm, PAC 1.6, PVC 0.2%.     TTE   03/14/22 LVEF 55-60%, nl RV, nl atria, no significant valvular dysfunction. LVIDd 4.2, IVSd 0.66, LVPWd 0.75    ETT     Results for orders placed or performed during the hospital encounter of 06/11/12   Basic Metabolic Panel   Result Value Ref Range    Sodium 138 135 - 145 MMOL/L    Potassium 4.3 3.5 - 5.0 MMOL/L    Chloride 106 99 - 109 MMOL/L    CO2 28 23 - 33 MMOL/L    Anion Gap 4 (L) 5 - 16 MMOL/L    Glucose 95 65 - 99 MG/DL    Calcium 9.2 8.5 - 81.1 MG/DL    BUN 29 (H) 8 - 25 MG/DL    Creatinine 9.14 7.82 - 1.00 MG/DL    BUN/Creatinine Ratio 51.0 (H) 10.0 - 24.0 RATIO    EGFR (Calc) >60 >60 mL/min/1.95m2   , No results found for this or any previous visit., No results found for this or any previous visit.,   No results found for this or any previous visit., No results found for this or any previous visit. and No results found for this or any previous visit.      Cardiac   No results found for: "BNP", "DIGOXIN"         Orders:   Orders Placed This Encounter   Procedures   . ECG 12 Lead ([facility])       Final Medication list:   Outpatient Encounter Medications as of 07/14/2022   Medication Sig Dispense Refill   . ascorbic acid (VITAMIN C) 500 MG tablet Take 500 mg by mouth daily.     Marland Kitchen aspirin 81 MG EC tablet Take 81 mg by mouth daily.     Marland Kitchen b complex vitamins capsule Take 1 capsule by mouth daily.     . cholecalciferol, vitamin D3, 50 mcg (2,000 unit) Tablet Take by mouth.     . dicyclomine (BENTYL) 10 MG capsule Take 1 capsule (10 mg total) by mouth 3 (three) times daily as needed. 90 capsule 1   . docusate sodium (COLACE) 100 MG capsule Take 100 mg by mouth 2 (two) times daily.     Marland Kitchen esomeprazole (NEXIUM) 20 MG capsule Take 20 mg by mouth every morning before breakfast.     . ESOMEPRAZOLE MAGNESIUM (NEXIUM ORAL) Take by mouth every other  day.     . famotidine (PEPCID) 10 MG tablet Take by mouth     . fluticasone propionate (FLONASE)  50 mcg/actuation nasal spray 1 spray by Nasal route daily. Indications: Allergic Rhinitis     . HYDROCODONE-ACETAMINOPHEN ORAL Take by mouth.     . magnesium oxide (MAGOX) 400 mg tablet Take 400 mg by mouth daily.     . metoprolol succinate ER (TOPROL XL) 25 MG extended release 24 hr tablet Take 0.5 tablets (12.5 mg total) by mouth daily     . multivitamin (THERAGRAN) per tablet Take 1 tablet by mouth daily.     Marland Kitchen oxyCODONE-acetaminophen (PERCOCET) 5-325 mg per tablet Take 1 tablet by mouth every 6 (six) hours as needed for pain. 5 tablet 0   . peg-electrolyte soln 420 gram SolR Per [facility] Instructions 1 Bottle 0   . simvastatin (ZOCOR) 10 MG tablet TAKE 0.5 TABLETS (5 MG) BY MOUTH DAILY. CURRENT DOSE FROM 05/19/21     . [DISCONTINUED] metoprolol succinate ER (TOPROL XL) 25 MG extended release 24 hr tablet Take 1 tablet (25 mg total) by mouth daily 30 tablet 11     No facility-administered encounter medications on file as of 07/14/2022.       Signed by  Rosezella Florida  07/14/2022  1:13 PM    I have reviewed labs and studies personally.

## 2022-08-01 DIAGNOSIS — Z794 Long term (current) use of insulin: Secondary | ICD-10-CM | POA: Diagnosis not present

## 2022-08-01 DIAGNOSIS — E119 Type 2 diabetes mellitus without complications: Secondary | ICD-10-CM | POA: Diagnosis not present

## 2022-08-05 ENCOUNTER — Encounter (INDEPENDENT_AMBULATORY_CARE_PROVIDER_SITE_OTHER): Payer: Self-pay

## 2022-08-05 ENCOUNTER — Ambulatory Visit (INDEPENDENT_AMBULATORY_CARE_PROVIDER_SITE_OTHER): Payer: Medicare PPO | Admitting: Family

## 2022-08-05 VITALS — BP 118/74 | HR 61 | Temp 97.8°F | Wt 110.0 lb

## 2022-08-05 DIAGNOSIS — E119 Type 2 diabetes mellitus without complications: Secondary | ICD-10-CM | POA: Diagnosis not present

## 2022-08-05 DIAGNOSIS — R350 Frequency of micturition: Secondary | ICD-10-CM

## 2022-08-05 DIAGNOSIS — N3281 Overactive bladder: Secondary | ICD-10-CM

## 2022-08-05 LAB — U/A AUTO DIPSTICK ONLY, ONSITE
Bilirubin, Urine: NEGATIVE
Glucose, Urine: NEGATIVE mg/dL
Ketones, URN: NEGATIVE mg/dL
Leukocytes: NEGATIVE
Nitrite, URN: NEGATIVE
Occult Blood, URN: NEGATIVE
Protein: NEGATIVE mg/dL
Specific Gravity, Urine: 1.015 (ref 1.005–1.030)
Urobilinogen, URN: 0.2 E.U./dL (ref 0.2–1.0)
pH, URN: 7 (ref 5.0–8.0)

## 2022-08-05 NOTE — Progress Notes (Signed)
Pt was roomed by Annaleese Guier   HM DUE:

## 2022-08-05 NOTE — Progress Notes (Signed)
Chief Complaint   Patient presents with    Urinary Tract Infection     Pt states was Dx w/ overactive bladder but yesterday was having to go every hour and 5x last night so concern that has an infection, fulls full, pressure           Subjective:     Paula Bowman is a 72 year old female who presents on 08/05/2022.    Pt presents She is followed by urology at [facility]. Diagnosed with overactive bladder. She has chronic frequent urination but worse since yesterday afternoon. She had increased night urination x 5. Urine volume is normal. No changes in beverages or diet. She had a spicy chili x 2 over the past 2 days.     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   Cardiovascular:      Rate and Rhythm: Normal rate and regular rhythm.   Pulmonary:      Effort: Pulmonary effort is normal.      Breath sounds: Normal breath sounds.   Abdominal:      General: Abdomen is flat. Bowel sounds are normal.      Palpations: Abdomen is soft.      Tenderness: There is no right CVA tenderness or left CVA tenderness.       Results for orders placed or performed in visit on 08/05/22   POC Urine Dipstick, Automated   Result Value Ref Range    Color, Urine YELLOW      Clarity, URN CLEAR     Glucose, Urine NEGATIVE NEG mg/dL    Bilirubin, Urine NEGATIVE NEG    Ketones, URN NEGATIVE NEG mg/dL    Specific Gravity, Urine 1.015 1.005 - 1.030    Occult Blood, URN NEGATIVE NEG    pH, URN 7.0 5.0 - 8.0    Protein NEGATIVE NEG-TRACE mg/dL    Urobilinogen, URN 0.2 0.2 - 1.0 E.U./dL    Nitrite, URN NEGATIVE NEG    Leukocytes NEGATIVE NEG            Assessment and Plan:      Paula Bowman was seen today for urinary tract infection.    Frequent urination  With hx of overactive bladder.   UA negative. Urine culture pending  Will await final urine culture and if positive will treat.   Advised of red flags  Follow up for worsening symptoms.   -  POC Urine Dipstick, Automated  -     Culture Urine, Bact; Future    Overactive bladder  Followed by urology at [facility]  Considered in above decision making      Other orders  -     ZEBRA LABELS

## 2022-08-07 LAB — URINE C/S: Culture: NO GROWTH

## 2022-08-07 NOTE — Result Encounter Note (Signed)
Final urine result confirms no bacterial infection  Symptoms may be from overactive bladder  Please follow up with your Urologist and/or Primary Care provider for more help addressing this

## 2022-08-29 ENCOUNTER — Telehealth (INDEPENDENT_AMBULATORY_CARE_PROVIDER_SITE_OTHER): Payer: Self-pay

## 2022-08-29 NOTE — Telephone Encounter (Signed)
RETURN CALL: Voicemail - Detailed Message      SUBJECT:  Appointment Request     REASON FOR VISIT: Pre-op appointment.  Surgery at [facility]  Date of surgery: 09/04/2022  PREFERRED DATE/TIME: ASAP  REASON UNABLE TO APPOINT: No appointments before 7/15.    Sheri from [place] called to schedule. Please call her back to schedule pre-op.

## 2022-08-29 NOTE — Telephone Encounter (Signed)
LVM for Sheri with [facility] [phone] - I asked that Lavonna Rua give [place] a CB to give additional information regarding a preop visit.

## 2022-09-01 ENCOUNTER — Encounter (INDEPENDENT_AMBULATORY_CARE_PROVIDER_SITE_OTHER): Payer: Medicare PPO | Admitting: Registered Nurse

## 2022-09-04 ENCOUNTER — Inpatient Hospital Stay: Payer: Self-pay

## 2022-09-04 DIAGNOSIS — E119 Type 2 diabetes mellitus without complications: Secondary | ICD-10-CM | POA: Diagnosis not present

## 2022-09-07 ENCOUNTER — Telehealth: Payer: Self-pay

## 2022-09-07 NOTE — Telephone Encounter (Signed)
Patient declined to schedule appointment for hosp follow up. Patient has doctor with [facility].   Closing TE

## 2022-09-07 NOTE — Telephone Encounter (Signed)
Transitional Care Management (TCM) - Post-Discharge Communication within 2 business days    Hospital Name/SNF:  [facility] Admission/SNF date:  09/04/22  Hospital Discharge/SNF date:  09/06/22  Reason for Hospitalization/SNF:  incidentally found 5mm AComm saccular aneurysm admitted to the hospital for [facility] and intervention  Discharge Provider:  Alycia Rossetti MD, Ann Maki  Discharge Diagnosis:  Brain aneurysm, 09/06/2022    Contact within two business days of discharge date:  YES  Contact type:  Telephone  Schedule patient within 7 days (high complexity condition such as stroke or MI), within 14 days (low to medium complexity) with provider:  YES .    Appointment Date:  Declined scheduling with primary care provider at this time. States was last with Dr. Roxanna Mew as PCP and would like transfer to Dr. Drusilla Kanner or other provider     Scheduled Appointments  Friday Aug. 9, 2024 12:00 PM PDT  With: Alycia Rossetti MD, Ann Maki  Where: [facility]    Review of Discharge Instructions: YES   Patient/Caregiver understands instructions: YES  Referrals, home health or community services planned on discharge:  NO.    Services received or scheduled:  N/A  Medications on discharge from hospital/snf:    Discharge Medication List  What How Much When Instructions  Changed clopidogrel (clopidogrel 75 mg oral tablet)  1 Tabs Oral Daily    Unchanged   ascorbic acid (VITAMIN C) Daily    aspirin (aspirin 325 mg oral delayed release tablet)  1 Tabs Oral Daily   Please take 162mg  aspirin daily (2 baby aspirin)    fluticasone nasal (FLONASE) NASAL Daily    ipratropium nasal (ipratropium 21 mcg/ inh  (0.03%) nasal spray)  NASAL Three Times Daily    magnesium aspartate Oral Twice Daily    multivitamin (Multi Vitamin+)  multivitamin (Vitamin B Complex oral capsule)  1 Capsules Oral Daily    multivitamin with minerals (Vitamin D with Minerals oral tablet)  1 Tabs Oral Daily    potassium acetate    simvastatin (simvastatin 5 mg oral tablet) Oral  Every Bedtime    tiZANidine (Zanaflex 4 mg oral tablet)  1 Tabs Oral Every Evening    triamcinolone topical (triamcinolone 0.5% topical ointment)    Taking medications as prescribed:  YES. Declined full review. Reports no questions  Any side effects of medication reported:  NO  Medication Adherence of statins: YES NO NA: YES  ED/IP/1 year 3+: NO  Risk Hospital Admission/ ED Visit (30-80%)  2%    Patient/Caregiver reports patient is able to care for self:  YES  Caregiver involved: NO.    Patient/Caregiver reports concerns:  NO. States has f/u with surgeon and specialists. Reports goal to return to [facility] with new PCP but declined assistance to schedule at this time.  Patient/Caregiver advised to call clinic if any concerns noted prior to follow-up appointment with provider.      RNCM CCM, HRCM opportunity  Further patient care as coordination, education, and support needed between provider visits.  Care Management offered:  NO  Patient agreed to Care Management enrollment support:  N/A  Complete Episode of Care and follow-up with patient timely if yes    Emilee Hero, RN  Contact made by: RN

## 2022-09-27 ENCOUNTER — Encounter: Payer: Self-pay | Admitting: Internal Medicine

## 2022-09-27 ENCOUNTER — Ambulatory Visit (INDEPENDENT_AMBULATORY_CARE_PROVIDER_SITE_OTHER): Payer: PPO

## 2022-09-27 ENCOUNTER — Ambulatory Visit (INDEPENDENT_AMBULATORY_CARE_PROVIDER_SITE_OTHER): Payer: PPO | Admitting: Internal Medicine

## 2022-09-27 ENCOUNTER — Other Ambulatory Visit: Payer: Self-pay | Admitting: Internal Medicine

## 2022-09-27 VITALS — BP 110/70 | HR 78 | Temp 98.6°F | Ht 70.0 in | Wt 179.0 lb

## 2022-09-27 DIAGNOSIS — F4323 Adjustment disorder with mixed anxiety and depressed mood: Secondary | ICD-10-CM

## 2022-09-27 DIAGNOSIS — Z794 Long term (current) use of insulin: Secondary | ICD-10-CM

## 2022-09-27 DIAGNOSIS — E119 Type 2 diabetes mellitus without complications: Secondary | ICD-10-CM

## 2022-09-27 DIAGNOSIS — Z0000 Encounter for general adult medical examination without abnormal findings: Secondary | ICD-10-CM | POA: Diagnosis not present

## 2022-09-27 DIAGNOSIS — E785 Hyperlipidemia, unspecified: Secondary | ICD-10-CM | POA: Diagnosis not present

## 2022-09-27 DIAGNOSIS — F4321 Adjustment disorder with depressed mood: Secondary | ICD-10-CM

## 2022-09-27 MED ORDER — ALPRAZOLAM 1 MG PO TABS: ORAL_TABLET | ORAL | 1 refills | Status: DC

## 2022-09-27 MED ORDER — PANTOPRAZOLE SODIUM 40 MG PO TBEC: 40.0000 mg | DELAYED_RELEASE_TABLET | Freq: Every day | ORAL | 3 refills | Status: DC

## 2022-09-27 MED ORDER — DULOXETINE HCL 20 MG PO CPEP: 20.0000 mg | ORAL_CAPSULE | Freq: Two times a day (BID) | ORAL | 3 refills | Status: DC

## 2022-09-27 NOTE — Progress Notes (Signed)
Subjective:  Patient ID: Catherine Mora, female    DOB: 09-24-1950  Age: 72 y.o. MRN: [id number]  CC: No chief complaint on file.   HPI Catherine Mora presents for DM, GERD - worse Her husband died in 09/04/2022 - grieving  Outpatient Medications Prior to Visit  Medication Sig Dispense Refill   BIOTIN FORTE PO Take 500 mg by mouth daily. (Patient not taking: Reported on 04/07/2022)     bisacodyl (DULCOLAX) 5 MG EC tablet Take 1 tablet (5 mg total) by mouth daily as needed for moderate constipation. 30 tablet 3   Continuous Blood Gluc Sensor (DEXCOM G6 SENSOR) MISC Inject 1 sensor to the skin every 10 days for continuous glucose monitoring.     Continuous Blood Gluc Transmit (DEXCOM G6 TRANSMITTER) MISC Use as directed for continuous glucose monitoring. Reuse transmitter for 90 days then discard and replace.     erythromycin ophthalmic ointment Place into both eyes 3 (three) times daily. (Patient not taking: Reported on 04/07/2022) 3.5 g 0   glimepiride (AMARYL) 2 MG tablet Take 2 mg by mouth every morning.     ibuprofen (ADVIL,MOTRIN) 200 MG tablet Take 400 mg by mouth every 6 (six) hours as needed for headache.     insulin detemir (LEVEMIR) 100 UNIT/ML FlexPen Inject 16 Units into the skin 2 (two) times daily.     Insulin Disposable Pump (OMNIPOD DASH PODS, GEN 4,) MISC SMARTSIG:SUB-Q Every 3 Days     Insulin Pen Needle 31G X 5 MM MISC Use with Levemir pen 100 each 0   lisinopril (ZESTRIL) 10 MG tablet Take 1 tablet by mouth daily.     lovastatin (MEVACOR) 20 MG tablet TAKE 1 TABLET BY MOUTH EVERY DAY 90 tablet 3   nitrofurantoin, macrocrystal-monohydrate, (MACROBID) 100 MG capsule Take 1 capsule (100 mg total) by mouth 2 (two) times daily. 14 capsule 1   NOVOLOG FLEXPEN 100 UNIT/ML FlexPen TAKE AS DIRECTED PER SLIDING SCALE. MAX DAILY DOSE: 50 UNITS/DAY.  3   ONETOUCH VERIO test strip 1 each by Other route 2 (two) times daily.      Polyethyl Glycol-Propyl Glycol (SYSTANE FREE OP)  Apply to eye.     ALPRAZolam (XANAX) 1 MG tablet Take 0.5 mg in morning and 1 mg at night prn 135 tablet 1   DULoxetine (CYMBALTA) 20 MG capsule Take 1 capsule (20 mg total) by mouth 2 (two) times daily. 180 capsule 3   omeprazole (PRILOSEC) 20 MG capsule Take 1 capsule (20 mg total) by mouth daily. Please schedule an yearly office visit for further refills. Thank you 90 capsule 0   No facility-administered medications prior to visit.    ROS: Review of Systems  Constitutional:  Negative for activity change, appetite change, chills, fatigue and unexpected weight change.  HENT:  Negative for congestion, mouth sores and sinus pressure.   Eyes:  Negative for visual disturbance.  Respiratory:  Negative for cough and chest tightness.   Gastrointestinal:  Negative for abdominal pain and nausea.  Genitourinary:  Negative for difficulty urinating, frequency and vaginal pain.  Musculoskeletal:  Negative for back pain and gait problem.  Skin:  Negative for pallor and rash.  Neurological:  Negative for dizziness, tremors, weakness, numbness and headaches.  Hematological:  Does not bruise/bleed easily.  Psychiatric/Behavioral:  Negative for confusion, sleep disturbance and suicidal ideas. The patient is nervous/anxious.     Objective:  BP 110/70 (BP Location: Left Arm, Patient Position: Sitting, Cuff Size: Normal)  Pulse 78   Temp 98.6 F (37 C) (Oral)   Ht 5\' 10"  (1.778 m)   Wt 179 lb (81.2 kg)   SpO2 94%   BMI 25.68 kg/m   BP Readings from Last 3 Encounters:  09/27/22 110/70  09/27/22 110/70  03/29/22 120/80    Wt Readings from Last 3 Encounters:  09/27/22 179 lb (81.2 kg)  09/27/22 179 lb (81.2 kg)  04/07/22 185 lb (83.9 kg)    Physical Exam Constitutional:      General: She is not in acute distress.    Appearance: She is well-developed. She is obese.  HENT:     Head: Normocephalic.     Right Ear: External ear normal.     Left Ear: External ear normal.     Nose: Nose  normal.  Eyes:     General:        Right eye: No discharge.        Left eye: No discharge.     Conjunctiva/sclera: Conjunctivae normal.     Pupils: Pupils are equal, round, and reactive to light.  Neck:     Thyroid: No thyromegaly.     Vascular: No JVD.     Trachea: No tracheal deviation.  Cardiovascular:     Rate and Rhythm: Normal rate and regular rhythm.     Heart sounds: Normal heart sounds.  Pulmonary:     Effort: No respiratory distress.     Breath sounds: No stridor. No wheezing.  Abdominal:     General: Bowel sounds are normal. There is no distension.     Palpations: Abdomen is soft. There is no mass.     Tenderness: There is no abdominal tenderness. There is no guarding or rebound.  Musculoskeletal:        General: No tenderness.     Cervical back: Normal range of motion and neck supple. No rigidity.  Lymphadenopathy:     Cervical: No cervical adenopathy.  Skin:    Findings: No erythema or rash.  Neurological:     Cranial Nerves: No cranial nerve deficit.     Motor: No abnormal muscle tone.     Coordination: Coordination normal.     Deep Tendon Reflexes: Reflexes normal.  Psychiatric:        Behavior: Behavior normal.        Thought Content: Thought content normal.        Judgment: Judgment normal.     Lab Results  Component Value Date   WBC 6.2 01/26/2022   HGB 13.0 01/26/2022   HCT 38.9 01/26/2022   PLT 270.0 01/26/2022   GLUCOSE 153 (H) 01/26/2022   CHOL 221 (H) 01/26/2022   TRIG 159.0 (H) 01/26/2022   HDL 47.60 01/26/2022   LDLDIRECT 219.0 08/14/2014   LDLCALC 142 (H) 01/26/2022   ALT 14 01/26/2022   AST 18 01/26/2022   NA 139 01/26/2022   K 4.4 01/26/2022   CL 102 01/26/2022   CREATININE 1.30 (H) 01/26/2022   BUN 31 (H) 01/26/2022   CO2 26 01/26/2022   TSH 3.40 01/26/2022   HGBA1C 6.9 (H) 03/28/2021   MICROALBUR 1.9 03/28/2021    CT Renal Stone Study  Result Date: 02/03/2022 CLINICAL DATA:  Abdominal/flank pain, stone suspected.  Urinary tract infection for 10 days with hematuria. Laterality not specified. EXAM: CT ABDOMEN AND PELVIS WITHOUT CONTRAST TECHNIQUE: Multidetector CT imaging of the abdomen and pelvis was performed following the standard protocol without IV contrast. RADIATION DOSE REDUCTION: This exam was performed  according to the departmental dose-optimization program which includes automated exposure control, adjustment of the mA and/or kV according to patient size and/or use of iterative reconstruction technique. COMPARISON:  Abdominopelvic CT 08/17/2014. FINDINGS: Lower chest: Mild linear atelectasis or scarring in the right middle lobe and both lung bases. Scattered mild coronary artery calcifications. Hepatobiliary: The liver appears unremarkable as imaged in the noncontrast state. No evidence of significant biliary dilatation status post cholecystectomy. Pancreas: Unremarkable. No pancreatic ductal dilatation or surrounding inflammatory changes. Spleen: Normal in size without focal abnormality. Adrenals/Urinary Tract: Both adrenal glands appear normal. No evidence of urinary tract calculus, suspicious renal lesion or hydronephrosis. The bladder appears unremarkable for its degree of distention. Stomach/Bowel: No enteric contrast administered. The stomach appears unremarkable for its degree of distension. No evidence of bowel wall thickening, distention or surrounding inflammatory change. Suspected previous appendectomy. Mildly prominent stool throughout the colon. Vascular/Lymphatic: There are no enlarged abdominal or pelvic lymph nodes. Mild aortoiliac atherosclerosis. Reproductive: Status post hysterectomy. No evidence of adnexal mass. Other: Soft tissue stranding in the subcutaneous fat of the low anterior abdominal wall which may relate to subcutaneous injections or trauma. No focal fluid collection, hernia or ascites. Musculoskeletal: No acute or significant osseous findings. Multilevel lumbar spondylosis with chronic  osseous foraminal narrowing at L5-S1. IMPRESSION: 1. No acute findings or explanation for the patient's symptoms. Specifically, no evidence of urinary tract calculus or hydronephrosis. 2. Mildly prominent stool throughout the colon. 3. Soft tissue stranding in the subcutaneous fat of the low anterior abdominal wall which may relate to subcutaneous injections or trauma. No focal fluid collection, hernia or ascites. 4. Multilevel lumbar spondylosis with chronic osseous foraminal narrowing at L5-S1. 5.  Aortic Atherosclerosis (ICD10-I70.0). Electronically Signed   By: Carey Bullocks M.D.   On: 02/03/2022 11:16    Assessment & Plan:   Problem List Items Addressed This Visit     Adjustment disorder with mixed anxiety and depressed mood     Potential benefits of a long term benzodiazepines  use as well as potential risks  and complications were explained to the patient and were aknowledged. Cont on Xanax prn, Cymbalta - Cymbalta 20 mg bid       Relevant Orders   Ambulatory referral to Psychology   CBC with Differential/Platelet   Comprehensive metabolic panel   Urinalysis   Microalbumin / creatinine urine ratio   Lipid panel   Hemoglobin A1c   Type 2 diabetes mellitus without complication (HCC)    On Insulin pump/Dexcom 6      Relevant Orders   CBC with Differential/Platelet   Comprehensive metabolic panel   Urinalysis   Microalbumin / creatinine urine ratio   Lipid panel   Hemoglobin A1c   Dyslipidemia    Coronary calcium CT score is 117.  On lovastatin      Relevant Orders   CBC with Differential/Platelet   Comprehensive metabolic panel   Urinalysis   Microalbumin / creatinine urine ratio   Lipid panel   Hemoglobin A1c   Grief - Primary    Husband died in August 23, 2022. Grief counseling referral       Relevant Orders   Ambulatory referral to Psychology      Meds ordered this encounter  Medications   pantoprazole (PROTONIX) 40 MG tablet    Sig: Take 1 tablet (40 mg  total) by mouth daily.    Dispense:  30 tablet    Refill:  3   ALPRAZolam (XANAX) 1 MG tablet  Sig: Take 0.5 mg in morning and 1 mg at night prn    Dispense:  135 tablet    Refill:  1   DULoxetine (CYMBALTA) 20 MG capsule    Sig: Take 1 capsule (20 mg total) by mouth 2 (two) times daily.    Dispense:  180 capsule    Refill:  3      Follow-up: Return in about 3 months (around 12/28/2022) for a follow-up visit.  Sonda Primes, MD

## 2022-09-27 NOTE — Assessment & Plan Note (Signed)
Potential benefits of a long term benzodiazepines  use as well as potential risks  and complications were explained to the patient and were aknowledged. Cont on Xanax prn, Cymbalta - Cymbalta 20 mg bid

## 2022-09-27 NOTE — Assessment & Plan Note (Addendum)
On Insulin pump/Dexcom 6

## 2022-09-27 NOTE — Assessment & Plan Note (Signed)
Coronary calcium CT score is 117.  On lovastatin

## 2022-09-27 NOTE — Assessment & Plan Note (Signed)
Husband died in August 09, 2022. Grief counseling referral

## 2022-09-27 NOTE — Patient Instructions (Signed)
Catherine Mora , Thank you for taking time to come for your Medicare Wellness Visit. I appreciate your ongoing commitment to your health goals. Please review the following plan we discussed and let me know if I can assist you in the future.   Referrals/Orders/Follow-Ups/Clinician Recommendations: No  This is a list of the screening recommended for you and due dates:  Health Maintenance  Topic Date Due   Complete foot exam   Never done   Eye exam for diabetics  Never done   DEXA scan (bone density measurement)  Never done   Mammogram  11/13/2020   Hemoglobin A1C  09/25/2021   Yearly kidney health urinalysis for diabetes  03/28/2022   Flu Shot  09/21/2022   DTaP/Tdap/Td vaccine (2 - Td or Tdap) 10/11/2022   Yearly kidney function blood test for diabetes  01/27/2023   Medicare Annual Wellness Visit  09/27/2023   Colon Cancer Screening  03/26/2026   Pneumonia Vaccine  Completed   Hepatitis C Screening  Completed   HPV Vaccine  Aged Out   COVID-19 Vaccine  Discontinued   Zoster (Shingles) Vaccine  Discontinued    Advanced directives: (Declined) Advance directive discussed with you today. Even though you declined this today, please call our office should you change your mind, and we can give you the proper paperwork for you to fill out.  Next Medicare Annual Wellness Visit scheduled for next year: No  Preventive Care 51 Years and Older, Female Preventive care refers to lifestyle choices and visits with your health care provider that can promote health and wellness. What does preventive care include? A yearly physical exam. This is also called an annual well check. Dental exams once or twice a year. Routine eye exams. Ask your health care provider how often you should have your eyes checked. Personal lifestyle choices, including: Daily care of your teeth and gums. Regular physical activity. Eating a healthy diet. Avoiding tobacco and drug use. Limiting alcohol use. Practicing safe  sex. Taking low-dose aspirin every day. Taking vitamin and mineral supplements as recommended by your health care provider. What happens during an annual well check? The services and screenings done by your health care provider during your annual well check will depend on your age, overall health, lifestyle risk factors, and family history of disease. Counseling  Your health care provider may ask you questions about your: Alcohol use. Tobacco use. Drug use. Emotional well-being. Home and relationship well-being. Sexual activity. Eating habits. History of falls. Memory and ability to understand (cognition). Work and work Astronomer. Reproductive health. Screening  You may have the following tests or measurements: Height, weight, and BMI. Blood pressure. Lipid and cholesterol levels. These may be checked every 5 years, or more frequently if you are over 4 years old. Skin check. Lung cancer screening. You may have this screening every year starting at age 45 if you have a 30-pack-year history of smoking and currently smoke or have quit within the past 15 years. Fecal occult blood test (FOBT) of the stool. You may have this test every year starting at age 5. Flexible sigmoidoscopy or colonoscopy. You may have a sigmoidoscopy every 5 years or a colonoscopy every 10 years starting at age 21. Hepatitis C blood test. Hepatitis B blood test. Sexually transmitted disease (STD) testing. Diabetes screening. This is done by checking your blood sugar (glucose) after you have not eaten for a while (fasting). You may have this done every 1-3 years. Bone density scan. This is done to screen  for osteoporosis. You may have this done starting at age 48. Mammogram. This may be done every 1-2 years. Talk to your health care provider about how often you should have regular mammograms. Talk with your health care provider about your test results, treatment options, and if necessary, the need for more  tests. Vaccines  Your health care provider may recommend certain vaccines, such as: Influenza vaccine. This is recommended every year. Tetanus, diphtheria, and acellular pertussis (Tdap, Td) vaccine. You may need a Td booster every 10 years. Zoster vaccine. You may need this after age 30. Pneumococcal 13-valent conjugate (PCV13) vaccine. One dose is recommended after age 68. Pneumococcal polysaccharide (PPSV23) vaccine. One dose is recommended after age 79. Talk to your health care provider about which screenings and vaccines you need and how often you need them. This information is not intended to replace advice given to you by your health care provider. Make sure you discuss any questions you have with your health care provider. Document Released: 03/05/2015 Document Revised: 10/27/2015 Document Reviewed: 12/08/2014 Elsevier Interactive Patient Education  2017 [employer].  Fall Prevention in the Home Falls can cause injuries. They can happen to people of all ages. There are many things you can do to make your home safe and to help prevent falls. What can I do on the outside of my home? Regularly fix the edges of walkways and driveways and fix any cracks. Remove anything that might make you trip as you walk through a door, such as a raised step or threshold. Trim any bushes or trees on the path to your home. Use bright outdoor lighting. Clear any walking paths of anything that might make someone trip, such as rocks or tools. Regularly check to see if handrails are loose or broken. Make sure that both sides of any steps have handrails. Any raised decks and porches should have guardrails on the edges. Have any leaves, snow, or ice cleared regularly. Use sand or salt on walking paths during winter. Clean up any spills in your garage right away. This includes oil or grease spills. What can I do in the bathroom? Use night lights. Install grab bars by the toilet and in the tub and shower.  Do not use towel bars as grab bars. Use non-skid mats or decals in the tub or shower. If you need to sit down in the shower, use a plastic, non-slip stool. Keep the floor dry. Clean up any water that spills on the floor as soon as it happens. Remove soap buildup in the tub or shower regularly. Attach bath mats securely with double-sided non-slip rug tape. Do not have throw rugs and other things on the floor that can make you trip. What can I do in the bedroom? Use night lights. Make sure that you have a light by your bed that is easy to reach. Do not use any sheets or blankets that are too big for your bed. They should not hang down onto the floor. Have a firm chair that has side arms. You can use this for support while you get dressed. Do not have throw rugs and other things on the floor that can make you trip. What can I do in the kitchen? Clean up any spills right away. Avoid walking on wet floors. Keep items that you use a lot in easy-to-reach places. If you need to reach something above you, use a strong step stool that has a grab bar. Keep electrical cords out of the way. Do  not use floor polish or wax that makes floors slippery. If you must use wax, use non-skid floor wax. Do not have throw rugs and other things on the floor that can make you trip. What can I do with my stairs? Do not leave any items on the stairs. Make sure that there are handrails on both sides of the stairs and use them. Fix handrails that are broken or loose. Make sure that handrails are as long as the stairways. Check any carpeting to make sure that it is firmly attached to the stairs. Fix any carpet that is loose or worn. Avoid having throw rugs at the top or bottom of the stairs. If you do have throw rugs, attach them to the floor with carpet tape. Make sure that you have a light switch at the top of the stairs and the bottom of the stairs. If you do not have them, ask someone to add them for you. What else  can I do to help prevent falls? Wear shoes that: Do not have high heels. Have rubber bottoms. Are comfortable and fit you well. Are closed at the toe. Do not wear sandals. If you use a stepladder: Make sure that it is fully opened. Do not climb a closed stepladder. Make sure that both sides of the stepladder are locked into place. Ask someone to hold it for you, if possible. Clearly mark and make sure that you can see: Any grab bars or handrails. First and last steps. Where the edge of each step is. Use tools that help you move around (mobility aids) if they are needed. These include: Canes. Walkers. Scooters. Crutches. Turn on the lights when you go into a dark area. Replace any light bulbs as soon as they burn out. Set up your furniture so you have a clear path. Avoid moving your furniture around. If any of your floors are uneven, fix them. If there are any pets around you, be aware of where they are. Review your medicines with your doctor. Some medicines can make you feel dizzy. This can increase your chance of falling. Ask your doctor what other things that you can do to help prevent falls. This information is not intended to replace advice given to you by your health care provider. Make sure you discuss any questions you have with your health care provider. Document Released: 12/03/2008 Document Revised: 07/15/2015 Document Reviewed: 03/13/2014 Elsevier Interactive Patient Education  2017 [employer].

## 2022-09-27 NOTE — Progress Notes (Addendum)
Subjective:   Catherine Mora is a 72 y.o. female who presents for Medicare Annual (Subsequent) preventive examination.  Visit Complete: In person  Review of Systems     Cardiac Risk Factors include: advanced age (>13men, >15 women);diabetes mellitus;dyslipidemia;hypertension;sedentary lifestyle     Objective:    Today's Vitals   09/27/22 1034 09/27/22 1035  BP: 110/70   Pulse: 78   Temp: 98.6 F (37 C)   TempSrc: Oral   SpO2: 94%   Weight: 179 lb (81.2 kg)   Height: 5\' 10"  (1.778 m)   PainSc: 2  2   PainLoc: Head    Body mass index is 25.68 kg/m.     09/27/2022   10:40 AM 12/10/2021   10:51 AM 09/19/2021    3:14 PM 06/27/2021   10:59 PM 04/21/2020    1:49 PM 01/19/2016   10:34 AM 11/16/2015    1:15 PM  Advanced Directives  Does Patient Have a Medical Advance Directive? No No No No No No No  Would patient like information on creating a medical advance directive? No - Patient declined Yes (ED - Information included in AVS) No - Patient declined  No - Patient declined      Current Medications (verified) Outpatient Encounter Medications as of 09/27/2022  Medication Sig   ALPRAZolam (XANAX) 1 MG tablet Take 0.5 mg in morning and 1 mg at night prn   BIOTIN FORTE PO Take 500 mg by mouth daily. (Patient not taking: Reported on 04/07/2022)   bisacodyl (DULCOLAX) 5 MG EC tablet Take 1 tablet (5 mg total) by mouth daily as needed for moderate constipation.   Continuous Blood Gluc Sensor (DEXCOM G6 SENSOR) MISC Inject 1 sensor to the skin every 10 days for continuous glucose monitoring.   Continuous Blood Gluc Transmit (DEXCOM G6 TRANSMITTER) MISC Use as directed for continuous glucose monitoring. Reuse transmitter for 90 days then discard and replace.   DULoxetine (CYMBALTA) 20 MG capsule Take 1 capsule (20 mg total) by mouth 2 (two) times daily.   erythromycin ophthalmic ointment Place into both eyes 3 (three) times daily. (Patient not taking: Reported on 04/07/2022)   glimepiride  (AMARYL) 2 MG tablet Take 2 mg by mouth every morning.   ibuprofen (ADVIL,MOTRIN) 200 MG tablet Take 400 mg by mouth every 6 (six) hours as needed for headache.   insulin detemir (LEVEMIR) 100 UNIT/ML FlexPen Inject 16 Units into the skin 2 (two) times daily.   Insulin Disposable Pump (OMNIPOD DASH PODS, GEN 4,) MISC SMARTSIG:SUB-Q Every 3 Days   Insulin Pen Needle 31G X 5 MM MISC Use with Levemir pen   lisinopril (ZESTRIL) 10 MG tablet Take 1 tablet by mouth daily.   lovastatin (MEVACOR) 20 MG tablet TAKE 1 TABLET BY MOUTH EVERY DAY   nitrofurantoin, macrocrystal-monohydrate, (MACROBID) 100 MG capsule Take 1 capsule (100 mg total) by mouth 2 (two) times daily.   NOVOLOG FLEXPEN 100 UNIT/ML FlexPen TAKE AS DIRECTED PER SLIDING SCALE. MAX DAILY DOSE: 50 UNITS/DAY.   ONETOUCH VERIO test strip 1 each by Other route 2 (two) times daily.    Polyethyl Glycol-Propyl Glycol (SYSTANE FREE OP) Apply to eye.   [DISCONTINUED] omeprazole (PRILOSEC) 20 MG capsule Take 1 capsule (20 mg total) by mouth daily. Please schedule an yearly office visit for further refills. Thank you   [DISCONTINUED] Pitavastatin Calcium 1 MG TABS Take 1 tablet (1 mg total) by mouth daily.   No facility-administered encounter medications on file as of 09/27/2022.  Allergies (verified) Metformin, Ciprofloxacin, Crestor [rosuvastatin calcium], Hydrochlorothiazide, Invokana [canagliflozin], Paroxetine, Penicillins, and Simvastatin   History: Past Medical History:  Diagnosis Date   Allergy    Anxiety    Cataract    starting, no surgery yet   Cholelithiasis 2011   Depression    Diabetes mellitus without complication (HCC)    DKA (diabetic ketoacidoses)    several years ago and in hospital x 1 week    GERD (gastroesophageal reflux disease)    Glucose intolerance (impaired glucose tolerance)    HTN (hypertension)    Hyperlipidemia    IBS (irritable bowel syndrome)    PONV (postoperative nausea and vomiting)    Past  Surgical History:  Procedure Laterality Date   ABDOMINAL HYSTERECTOMY     CHOLECYSTECTOMY N/A 07/03/2012   Procedure: LAPAROSCOPIC CHOLECYSTECTOMY WITH INTRAOPERATIVE CHOLANGIOGRAM;  Surgeon: Liz Malady, MD;  Location: [facility] OR;  Service: General;  Laterality: N/A;   COLONOSCOPY     cyst removed     x3   OOPHORECTOMY     POLYPECTOMY     Family History  Problem Relation Age of Onset   Diabetes Mother    Diabetes Sister    Diabetes Brother    Diabetes Other    Stomach cancer Paternal Uncle    Heart disease Neg Hx    Colon cancer Neg Hx    Esophageal cancer Neg Hx    Rectal cancer Neg Hx    Colon polyps Neg Hx    Social History   Socioeconomic History   Marital status: Married    Spouse name: Not on file   Number of children: 1   Years of education: Not on file   Highest education level: Not on file  Occupational History   Occupation: [employer]    Comment: Wks w/husband  Tobacco Use   Smoking status: Never   Smokeless tobacco: Never  Vaping Use   Vaping status: Never Used  Substance and Sexual Activity   Alcohol use: Yes    Comment: very little   Drug use: No   Sexual activity: Yes  Other Topics Concern   Not on file  Social History Narrative   Not on file   Social Determinants of Health   Financial Resource Strain: Low Risk  (09/27/2022)   Overall Financial Resource Strain (CARDIA)    Difficulty of Paying Living Expenses: Not hard at all  Food Insecurity: No Food Insecurity (09/27/2022)   Hunger Vital Sign    Worried About Running Out of Food in the Last Year: Never true    Ran Out of Food in the Last Year: Never true  Transportation Needs: No Transportation Needs (09/27/2022)   PRAPARE - Administrator, Civil Service (Medical): No    Lack of Transportation (Non-Medical): No  Physical Activity: Inactive (09/27/2022)   Exercise Vital Sign    Days of Exercise per Week: 0 days    Minutes of Exercise per Session: 0 min  Stress: No Stress Concern  Present (09/27/2022)   [employer] of Occupational Health - Occupational Stress Questionnaire    Feeling of Stress : Not at all  Social Connections: Socially Integrated (09/27/2022)   Social Connection and Isolation Panel [NHANES]    Frequency of Communication with Friends and Family: Three times a week    Frequency of Social Gatherings with Friends and Family: Three times a week    Attends Religious Services: 1 to 4 times per year    Active Member of  Clubs or Organizations: No    Attends Engineer, structural: 1 to 4 times per year    Marital Status: Married    Tobacco Counseling Counseling given: Not Answered   Clinical Intake:  Pre-visit preparation completed: Yes  Pain : 0-10 Pain Score: 2  Pain Type: Acute pain Pain Location: Head (headache)     BMI - recorded: 25.68 Nutritional Status: BMI 25 -29 Overweight Nutritional Risks: None Diabetes: No  How often do you need to have someone help you when you read instructions, pamphlets, or other written materials from your doctor or pharmacy?: 1 - Never What is the last grade level you completed in school?: HSG  Interpreter Needed?: No  Information entered by :: Susie Cassette, LPN.   Activities of Daily Living    09/27/2022   10:43 AM  In your present state of health, do you have any difficulty performing the following activities:  Hearing? 0  Vision? 0  Difficulty concentrating or making decisions? 0  Walking or climbing stairs? 0  Dressing or bathing? 0  Doing errands, shopping? 0  Preparing Food and eating ? N  Using the Toilet? N  In the past six months, have you accidently leaked urine? N  Do you have problems with loss of bowel control? N  Managing your Medications? N  Managing your Finances? N  Housekeeping or managing your Housekeeping? N    Patient Care Team: Plotnikov, Georgina Quint, MD as PCP - General Candice Camp, MD as Obstetrician (Obstetrics and Gynecology) Izell [place]ure[place], MD as  Referring Physician (Endocrinology) Ginette Otto, [facility] as Consulting Physician (Obstetrics and Gynecology) [facility] as Consulting Physician (Optometry)  Indicate any recent Medical Services you may have received from other than [facility] providers in the past year (date may be approximate).     Assessment:   This is a routine wellness examination for Dynisha.  Hearing/Vision screen Hearing Screening - Comments:: Patient denied any hearing difficulty.   No hearing aids.  Vision Screening - Comments:: Patient does wear otc readers.  Eye exam done by: [facility]   Dietary issues and exercise activities discussed:     Goals Addressed   None   Depression Screen    09/27/2022   10:15 AM 04/07/2022    9:47 AM 03/29/2022    9:53 AM 12/27/2021    8:19 AM 09/28/2021   11:10 AM 09/19/2021    3:15 PM 09/19/2021    3:13 PM  PHQ 2/9 Scores  PHQ - 2 Score 2 0 0 0 0 0 0  PHQ- 9 Score 7  0 0 0      Fall Risk    09/27/2022   10:43 AM 04/07/2022    9:47 AM 03/29/2022    9:53 AM 10/11/2021   11:20 AM 09/28/2021   11:10 AM  Fall Risk   Falls in the past year? 0 0 0 0 0  Comment    continues to deny new/ recent falls x 12 months; does not use assistive devices   Number falls in past yr: 0 0 0 0 0  Injury with Fall? 0 0 0 0 0  Comment    N/A- no falls reported   Risk for fall due to : No Fall Risks No Fall Risks No Fall Risks No Fall Risks No Fall Risks  Follow up Falls prevention discussed Falls evaluation completed Falls evaluation completed Falls prevention discussed Falls evaluation completed    MEDICARE RISK AT HOME:  Medicare Risk  at Home - 09/27/22 1043     Any stairs in or around the home? Yes    If so, are there any without handrails? No    Home free of loose throw rugs in walkways, pet beds, electrical cords, etc? Yes    Adequate lighting in your home to reduce risk of falls? Yes    Life alert? No    Use of a cane, walker or w/c? No    Grab bars in the  bathroom? No    Shower chair or bench in shower? Yes    Elevated toilet seat or a handicapped toilet? No             TIMED UP AND GO:  Was the test performed?  Yes  Length of time to ambulate 10 feet: 8 sec Gait steady and fast without use of assistive device    Cognitive Function:        09/27/2022   10:43 AM 09/19/2021    3:21 PM  6CIT Screen  What Year? 0 points 0 points  What month? 0 points 0 points  What time? 0 points 0 points  Count back from 20 0 points 0 points  Months in reverse 0 points 0 points  Repeat phrase 0 points 0 points  Total Score 0 points 0 points    Immunizations Immunization History  Administered Date(s) Administered   Fluad Quad(high Dose 65+) 03/09/2020, 12/28/2020, 12/27/2021   Influenza, High Dose Seasonal PF 12/24/2017, 10/14/2018   PNEUMOCOCCAL CONJUGATE-20 12/28/2020   Pneumococcal Conjugate-13 09/28/2015   Pneumococcal Polysaccharide-23 10/03/2016   Tdap 10/10/2012   Zoster Recombinant(Shingrix) 07/23/2022    TDAP status: Up to date  Flu Vaccine status: Up to date  Pneumococcal vaccine status: Up to date  Covid-19 vaccine status: Declined, Education has been provided regarding the importance of this vaccine but patient still declined. Advised may receive this vaccine at local pharmacy or Health Dept.or vaccine clinic. Aware to provide a copy of the vaccination record if obtained from local pharmacy or Health Dept. Verbalized acceptance and understanding.  Qualifies for Shingles Vaccine? Yes   Zostavax completed No   Shingrix Completed?: No.    Education has been provided regarding the importance of this vaccine. Patient has been advised to call insurance company to determine out of pocket expense if they have not yet received this vaccine. Advised may also receive vaccine at local pharmacy or Health Dept. Verbalized acceptance and understanding.  Screening Tests Health Maintenance  Topic Date Due   FOOT EXAM  Never done    OPHTHALMOLOGY EXAM  Never done   DEXA SCAN  Never done   MAMMOGRAM  11/13/2020   HEMOGLOBIN A1C  09/25/2021   Diabetic kidney evaluation - Urine ACR  03/28/2022   INFLUENZA VACCINE  09/21/2022   DTaP/Tdap/Td (2 - Td or Tdap) 10/11/2022   Diabetic kidney evaluation - eGFR measurement  01/27/2023   Medicare Annual Wellness (AWV)  09/27/2023   Colonoscopy  03/26/2026   Pneumonia Vaccine 37+ Years old  Completed   Hepatitis C Screening  Completed   HPV VACCINES  Aged Out   COVID-19 Vaccine  Discontinued   Zoster Vaccines- Shingrix  Discontinued    Health Maintenance  Health Maintenance Due  Topic Date Due   FOOT EXAM  Never done   OPHTHALMOLOGY EXAM  Never done   DEXA SCAN  Never done   MAMMOGRAM  11/13/2020   HEMOGLOBIN A1C  09/25/2021   Diabetic kidney evaluation - Urine ACR  03/28/2022   INFLUENZA VACCINE  09/21/2022    Colorectal cancer screening: Type of screening: Colonoscopy. Completed 03/27/2019. Repeat every 7 years  Mammogram status: Completed 11/14/2018. Repeat every year Patient will schedule with OB/GYN office.  Bone Density scan: Never done.  Lung Cancer Screening: (Low Dose CT Chest recommended if Age 33-80 years, 20 pack-year currently smoking OR have quit w/in 15years.) does not qualify.   Lung Cancer Screening Referral: no  Additional Screening:  Hepatitis C Screening: does qualify; Completed 09/28/2015  Vision Screening: Recommended annual ophthalmology exams for early detection of glaucoma and other disorders of the eye. Is the patient up to date with their annual eye exam?  Yes  Who is the provider or what is the name of the office in which the patient attends annual eye exams? [facility] If pt is not established with a provider, would they like to be referred to a provider to establish care? No .   Dental Screening: Recommended annual dental exams for proper oral hygiene  Diabetic Foot Exam: Diabetic Foot Exam: Overdue, Pt has been advised about  the importance in completing this exam. Pt is scheduled for diabetic foot exam on 09/27/2022.  Community Resource Referral / Chronic Care Management: CRR required this visit?  No   CCM required this visit?  No     Plan:     I have personally reviewed and noted the following in the patient's chart:   Medical and social history Use of alcohol, tobacco or illicit drugs  Current medications and supplements including opioid prescriptions. Patient is not currently taking opioid prescriptions. Functional ability and status Nutritional status Physical activity Advanced directives List of other physicians Hospitalizations, surgeries, and ER visits in previous 12 months Vitals Screenings to include cognitive, depression, and falls Referrals and appointments  In addition, I have reviewed and discussed with patient certain preventive protocols, quality metrics, and best practice recommendations. A written personalized care plan for preventive services as well as general preventive health recommendations were provided to patient.     Mickeal Needy, LPN   09/21/9560   After Visit Summary: Printed and given to patient.  Nurse Notes: Normal cognitive status assessed by direct observation by this Nurse Health Advisor. No abnormalities found.    Medical screening examination/treatment/procedure(s) were performed by non-physician practitioner and as supervising physician I was immediately available for consultation/collaboration.  I agree with above. Jacinta Shoe, MD

## 2022-10-05 ENCOUNTER — Other Ambulatory Visit (INDEPENDENT_AMBULATORY_CARE_PROVIDER_SITE_OTHER): Payer: Self-pay | Admitting: Nursing

## 2022-10-05 ENCOUNTER — Telehealth (INDEPENDENT_AMBULATORY_CARE_PROVIDER_SITE_OTHER): Payer: Self-pay | Admitting: Internal Medicine

## 2022-10-05 DIAGNOSIS — E119 Type 2 diabetes mellitus without complications: Secondary | ICD-10-CM | POA: Diagnosis not present

## 2022-10-05 DIAGNOSIS — E782 Mixed hyperlipidemia: Secondary | ICD-10-CM

## 2022-10-05 MED ORDER — SIMVASTATIN 10 MG OR TABS
10.0000 mg | ORAL_TABLET | Freq: Every day | ORAL | 3 refills | Status: DC
Start: 2022-10-05 — End: 2023-07-03

## 2022-10-05 NOTE — Telephone Encounter (Signed)
Contacted pt    Currently has no PCP at [facility] and currently no one is accepting new pts. She no longer wants to see Maoud    Pt currently taking:    [facility] prescribes:   Clopidogrel 75mg  daily   Vit D 1 tab daily   Magnesium Oxide 400 mg daily    [facility] prescribes:   Simvistatin 10 mg tab daily  Albuterol HFA 2 puffs by mouth three times day prn    Please advise if any further care management needed     RN

## 2022-10-05 NOTE — Telephone Encounter (Signed)
Pt has not been filling her statin, she does need refill     She has also increased the dose to 10 mg daily instead of 5 mg     In regards to provider is pt able to see Renee without a PCP listed    Engineer, water

## 2022-10-05 NOTE — Telephone Encounter (Signed)
Thanks!  I faxed the script.  Is okay to schedule her to [facility] who she has seen previously.

## 2022-10-05 NOTE — Telephone Encounter (Signed)
I guess her insurer is saying that she hasn't been filling her simvastatin.

## 2022-10-05 NOTE — Telephone Encounter (Signed)
[employer] faxed a med reconciliation post discharge form to [facility] who has seen this patient.  Looks like previously seen by Dr. Roxanna Mew as well as others.   They note medication adherence issue possibly for simvastatin and other meds as no prescription claims submitted since hospital discharge in July     Can RN Care management do out reach and try to sort out

## 2022-10-06 NOTE — Telephone Encounter (Signed)
RETURN CALL: Voicemail - Detailed Message      SUBJECT:  General Message     MESSAGE: Patient called dissatisfied that clinic is calling and said they dont need an app.     Patient said it is ridiculous that she is asking her to do a ct and an angio she is seeing a nurosurgeon    Happy to keep renee as pcp going forward    Declines to see pcp.

## 2022-10-06 NOTE — Telephone Encounter (Signed)
Patient notified of rx and to call pharmacy to pick up.     Nothing further needed, at this time. Closing encounter.

## 2022-10-18 ENCOUNTER — Ambulatory Visit: Payer: PPO | Admitting: Licensed Clinical Social Worker

## 2022-11-01 DIAGNOSIS — Z794 Long term (current) use of insulin: Secondary | ICD-10-CM | POA: Diagnosis not present

## 2022-11-01 DIAGNOSIS — Z9641 Presence of insulin pump (external) (internal): Secondary | ICD-10-CM | POA: Insufficient documentation

## 2022-11-01 DIAGNOSIS — E119 Type 2 diabetes mellitus without complications: Secondary | ICD-10-CM | POA: Diagnosis not present

## 2022-11-05 DIAGNOSIS — E119 Type 2 diabetes mellitus without complications: Secondary | ICD-10-CM | POA: Diagnosis not present

## 2022-11-13 DIAGNOSIS — Z794 Long term (current) use of insulin: Secondary | ICD-10-CM | POA: Diagnosis not present

## 2022-11-13 DIAGNOSIS — E119 Type 2 diabetes mellitus without complications: Secondary | ICD-10-CM | POA: Diagnosis not present

## 2022-12-05 DIAGNOSIS — E119 Type 2 diabetes mellitus without complications: Secondary | ICD-10-CM | POA: Diagnosis not present

## 2022-12-26 ENCOUNTER — Emergency Department: Payer: Self-pay

## 2022-12-26 DIAGNOSIS — E119 Type 2 diabetes mellitus without complications: Secondary | ICD-10-CM | POA: Diagnosis not present

## 2022-12-26 DIAGNOSIS — Z794 Long term (current) use of insulin: Secondary | ICD-10-CM | POA: Diagnosis not present

## 2022-12-28 ENCOUNTER — Ambulatory Visit: Payer: PPO | Admitting: Internal Medicine

## 2022-12-28 ENCOUNTER — Encounter: Payer: Self-pay | Admitting: Internal Medicine

## 2022-12-28 VITALS — BP 116/70 | HR 80 | Temp 98.8°F | Ht 70.0 in | Wt 182.0 lb

## 2022-12-28 DIAGNOSIS — F419 Anxiety disorder, unspecified: Secondary | ICD-10-CM

## 2022-12-28 DIAGNOSIS — Z794 Long term (current) use of insulin: Secondary | ICD-10-CM

## 2022-12-28 DIAGNOSIS — I10 Essential (primary) hypertension: Secondary | ICD-10-CM

## 2022-12-28 DIAGNOSIS — F4323 Adjustment disorder with mixed anxiety and depressed mood: Secondary | ICD-10-CM | POA: Diagnosis not present

## 2022-12-28 DIAGNOSIS — E119 Type 2 diabetes mellitus without complications: Secondary | ICD-10-CM | POA: Diagnosis not present

## 2022-12-28 MED ORDER — ALPRAZOLAM 1 MG PO TABS: ORAL_TABLET | ORAL | 1 refills | Status: DC

## 2022-12-28 MED ORDER — SCOPOLAMINE 1 MG/3DAYS TD PT72: 1.0000 | MEDICATED_PATCH | TRANSDERMAL | 0 refills | Status: AC

## 2022-12-28 NOTE — Assessment & Plan Note (Signed)
Grieving - doing ok

## 2022-12-28 NOTE — Assessment & Plan Note (Signed)
BP Readings from Last 3 Encounters:  12/28/22 116/70  09/27/22 110/70  09/27/22 110/70

## 2022-12-28 NOTE — Progress Notes (Signed)
Subjective:  Patient ID: Catherine Mora, female    DOB: Sep 10, 1950  Age: 72 y.o. MRN: [id number]  CC: Medical Management of Chronic Issues (3 mnth f/u)   HPI CHANTEE CERINO presents for DM, anxiety, HTN  Outpatient Medications Prior to Visit  Medication Sig Dispense Refill   bisacodyl (DULCOLAX) 5 MG EC tablet Take 1 tablet (5 mg total) by mouth daily as needed for moderate constipation. 30 tablet 3   Continuous Blood Gluc Sensor (DEXCOM G6 SENSOR) MISC Inject 1 sensor to the skin every 10 days for continuous glucose monitoring.     Continuous Blood Gluc Transmit (DEXCOM G6 TRANSMITTER) MISC Use as directed for continuous glucose monitoring. Reuse transmitter for 90 days then discard and replace.     DULoxetine (CYMBALTA) 20 MG capsule Take 1 capsule (20 mg total) by mouth 2 (two) times daily. 180 capsule 3   glimepiride (AMARYL) 2 MG tablet Take 2 mg by mouth every morning.     ibuprofen (ADVIL,MOTRIN) 200 MG tablet Take 400 mg by mouth every 6 (six) hours as needed for headache.     insulin detemir (LEVEMIR) 100 UNIT/ML FlexPen Inject 16 Units into the skin 2 (two) times daily.     Insulin Disposable Pump (OMNIPOD DASH PODS, GEN 4,) MISC SMARTSIG:SUB-Q Every 3 Days     Insulin Pen Needle 31G X 5 MM MISC Use with Levemir pen 100 each 0   lisinopril (ZESTRIL) 10 MG tablet Take 1 tablet by mouth daily.     lovastatin (MEVACOR) 20 MG tablet TAKE 1 TABLET BY MOUTH EVERY DAY 90 tablet 3   nitrofurantoin, macrocrystal-monohydrate, (MACROBID) 100 MG capsule Take 1 capsule (100 mg total) by mouth 2 (two) times daily. 14 capsule 1   NOVOLOG FLEXPEN 100 UNIT/ML FlexPen TAKE AS DIRECTED PER SLIDING SCALE. MAX DAILY DOSE: 50 UNITS/DAY.  3   ONETOUCH VERIO test strip 1 each by Other route 2 (two) times daily.      pantoprazole (PROTONIX) 40 MG tablet TAKE 1 TABLET BY MOUTH EVERY DAY 90 tablet 1   Polyethyl Glycol-Propyl Glycol (SYSTANE FREE OP) Apply to eye.     ALPRAZolam (XANAX) 1 MG tablet  Take 0.5 mg in morning and 1 mg at night prn 135 tablet 1   BIOTIN FORTE PO Take 500 mg by mouth daily. (Patient not taking: Reported on 04/07/2022)     OZEMPIC, 0.25 OR 0.5 MG/DOSE, 2 MG/3ML SOPN Inject 0.5 mg into the skin once a week.     erythromycin ophthalmic ointment Place into both eyes 3 (three) times daily. (Patient not taking: Reported on 04/07/2022) 3.5 g 0   No facility-administered medications prior to visit.    ROS: Review of Systems  Constitutional:  Negative for activity change, appetite change, chills, fatigue and unexpected weight change.  HENT:  Negative for congestion, mouth sores and sinus pressure.   Eyes:  Negative for visual disturbance.  Respiratory:  Negative for cough and chest tightness.   Gastrointestinal:  Negative for abdominal pain and nausea.  Genitourinary:  Negative for difficulty urinating, frequency and vaginal pain.  Musculoskeletal:  Negative for back pain and gait problem.  Skin:  Negative for pallor and rash.  Neurological:  Negative for dizziness, tremors, weakness, numbness and headaches.  Psychiatric/Behavioral:  Negative for confusion and sleep disturbance.     Objective:  BP 116/70 (BP Location: Right Arm, Patient Position: Sitting, Cuff Size: Normal)   Pulse 80   Temp 98.8 F (37.1 C) (Oral)  Ht 5\' 10"  (1.778 m)   Wt 182 lb (82.6 kg)   SpO2 95%   BMI 26.11 kg/m   BP Readings from Last 3 Encounters:  12/28/22 116/70  09/27/22 110/70  09/27/22 110/70    Wt Readings from Last 3 Encounters:  12/28/22 182 lb (82.6 kg)  09/27/22 179 lb (81.2 kg)  09/27/22 179 lb (81.2 kg)    Physical Exam Constitutional:      General: She is not in acute distress.    Appearance: She is well-developed.  HENT:     Head: Normocephalic.     Right Ear: External ear normal.     Left Ear: External ear normal.     Nose: Nose normal.  Eyes:     General:        Right eye: No discharge.        Left eye: No discharge.     Conjunctiva/sclera:  Conjunctivae normal.     Pupils: Pupils are equal, round, and reactive to light.  Neck:     Thyroid: No thyromegaly.     Vascular: No JVD.     Trachea: No tracheal deviation.  Cardiovascular:     Rate and Rhythm: Normal rate and regular rhythm.     Heart sounds: Normal heart sounds.  Pulmonary:     Effort: No respiratory distress.     Breath sounds: No stridor. No wheezing.  Abdominal:     General: Bowel sounds are normal. There is no distension.     Palpations: Abdomen is soft. There is no mass.     Tenderness: There is no abdominal tenderness. There is no guarding or rebound.  Musculoskeletal:        General: No tenderness.     Cervical back: Normal range of motion and neck supple. No rigidity.  Lymphadenopathy:     Cervical: No cervical adenopathy.  Skin:    Findings: No erythema or rash.  Neurological:     Mental Status: She is oriented to person, place, and time.     Cranial Nerves: No cranial nerve deficit.     Motor: No abnormal muscle tone.     Coordination: Coordination normal.     Deep Tendon Reflexes: Reflexes normal.  Psychiatric:        Behavior: Behavior normal.        Thought Content: Thought content normal.        Judgment: Judgment normal.     Lab Results  Component Value Date   WBC 6.2 01/26/2022   HGB 13.0 01/26/2022   HCT 38.9 01/26/2022   PLT 270.0 01/26/2022   GLUCOSE 153 (H) 01/26/2022   CHOL 221 (H) 01/26/2022   TRIG 159.0 (H) 01/26/2022   HDL 47.60 01/26/2022   LDLDIRECT 219.0 08/14/2014   LDLCALC 142 (H) 01/26/2022   ALT 14 01/26/2022   AST 18 01/26/2022   NA 139 01/26/2022   K 4.4 01/26/2022   CL 102 01/26/2022   CREATININE 1.30 (H) 01/26/2022   BUN 31 (H) 01/26/2022   CO2 26 01/26/2022   TSH 3.40 01/26/2022   HGBA1C 6.9 (H) 03/28/2021   MICROALBUR 1.9 03/28/2021    CT Renal Stone Study  Result Date: 02/03/2022 CLINICAL DATA:  Abdominal/flank pain, stone suspected. Urinary tract infection for 10 days with hematuria. Laterality  not specified. EXAM: CT ABDOMEN AND PELVIS WITHOUT CONTRAST TECHNIQUE: Multidetector CT imaging of the abdomen and pelvis was performed following the standard protocol without IV contrast. RADIATION DOSE REDUCTION: This exam was performed according  to the departmental dose-optimization program which includes automated exposure control, adjustment of the mA and/or kV according to patient size and/or use of iterative reconstruction technique. COMPARISON:  Abdominopelvic CT 08/17/2014. FINDINGS: Lower chest: Mild linear atelectasis or scarring in the right middle lobe and both lung bases. Scattered mild coronary artery calcifications. Hepatobiliary: The liver appears unremarkable as imaged in the noncontrast state. No evidence of significant biliary dilatation status post cholecystectomy. Pancreas: Unremarkable. No pancreatic ductal dilatation or surrounding inflammatory changes. Spleen: Normal in size without focal abnormality. Adrenals/Urinary Tract: Both adrenal glands appear normal. No evidence of urinary tract calculus, suspicious renal lesion or hydronephrosis. The bladder appears unremarkable for its degree of distention. Stomach/Bowel: No enteric contrast administered. The stomach appears unremarkable for its degree of distension. No evidence of bowel wall thickening, distention or surrounding inflammatory change. Suspected previous appendectomy. Mildly prominent stool throughout the colon. Vascular/Lymphatic: There are no enlarged abdominal or pelvic lymph nodes. Mild aortoiliac atherosclerosis. Reproductive: Status post hysterectomy. No evidence of adnexal mass. Other: Soft tissue stranding in the subcutaneous fat of the low anterior abdominal wall which may relate to subcutaneous injections or trauma. No focal fluid collection, hernia or ascites. Musculoskeletal: No acute or significant osseous findings. Multilevel lumbar spondylosis with chronic osseous foraminal narrowing at L5-S1. IMPRESSION: 1. No acute  findings or explanation for the patient's symptoms. Specifically, no evidence of urinary tract calculus or hydronephrosis. 2. Mildly prominent stool throughout the colon. 3. Soft tissue stranding in the subcutaneous fat of the low anterior abdominal wall which may relate to subcutaneous injections or trauma. No focal fluid collection, hernia or ascites. 4. Multilevel lumbar spondylosis with chronic osseous foraminal narrowing at L5-S1. 5.  Aortic Atherosclerosis (ICD10-I70.0). Electronically Signed   By: Carey Bullocks M.D.   On: 02/03/2022 11:16    Assessment & Plan:   Problem List Items Addressed This Visit     Adjustment disorder with mixed anxiety and depressed mood    Grieving - doing ok      Hypertensive disorder    BP Readings from Last 3 Encounters:  12/28/22 116/70  09/27/22 110/70  09/27/22 110/70         Type 2 diabetes mellitus without complication (HCC) - Primary    On Insulin pump/Dexcom 6      Relevant Medications   OZEMPIC, 0.25 OR 0.5 MG/DOSE, 2 MG/3ML SOPN   Anxiety    Cont w/prn Xanax  Potential benefits of a long term benzodiazepines  use as well as potential risks  and complications were explained to the patient and were aknowledged.      Relevant Medications   ALPRAZolam (XANAX) 1 MG tablet      Meds ordered this encounter  Medications   ALPRAZolam (XANAX) 1 MG tablet    Sig: Take 0.5 mg in morning and 1 mg at night prn    Dispense:  135 tablet    Refill:  1   scopolamine (TRANSDERM-SCOP) 1 MG/3DAYS    Sig: Place 1 patch (1.5 mg total) onto the skin every 3 (three) days.    Dispense:  4 patch    Refill:  0      Follow-up: Return in about 3 months (around 03/30/2023) for a follow-up visit.  Sonda Primes, MD

## 2022-12-28 NOTE — Assessment & Plan Note (Signed)
On Insulin pump/Dexcom 6

## 2022-12-28 NOTE — Assessment & Plan Note (Signed)
Cont w/prn Xanax  Potential benefits of a long term benzodiazepines  use as well as potential risks  and complications were explained to the patient and were aknowledged.

## 2023-01-04 DIAGNOSIS — M7672 Peroneal tendinitis, left leg: Secondary | ICD-10-CM | POA: Diagnosis not present

## 2023-01-04 DIAGNOSIS — M7671 Peroneal tendinitis, right leg: Secondary | ICD-10-CM | POA: Diagnosis not present

## 2023-01-04 DIAGNOSIS — Z794 Long term (current) use of insulin: Secondary | ICD-10-CM | POA: Diagnosis not present

## 2023-01-04 DIAGNOSIS — E119 Type 2 diabetes mellitus without complications: Secondary | ICD-10-CM | POA: Diagnosis not present

## 2023-01-05 DIAGNOSIS — E119 Type 2 diabetes mellitus without complications: Secondary | ICD-10-CM | POA: Diagnosis not present

## 2023-01-09 ENCOUNTER — Telehealth: Payer: Self-pay

## 2023-01-09 DIAGNOSIS — Z1231 Encounter for screening mammogram for malignant neoplasm of breast: Secondary | ICD-10-CM

## 2023-01-09 NOTE — Patient Outreach (Signed)
Spoke with patient who agrees to MM and states her eye exam is scheduled for 01/11/23. Nicholes Rough, CMA Care Guide [employer]

## 2023-01-09 NOTE — Addendum Note (Signed)
Addended by: Sylvester Harder on: 01/09/2023 08:58 AM   Modules accepted: Orders

## 2023-01-21 ENCOUNTER — Emergency Department: Payer: Self-pay

## 2023-01-26 ENCOUNTER — Emergency Department: Payer: Self-pay

## 2023-01-26 ENCOUNTER — Ambulatory Visit (INDEPENDENT_AMBULATORY_CARE_PROVIDER_SITE_OTHER): Payer: Medicare PPO | Admitting: Family

## 2023-01-26 VITALS — BP 100/68 | HR 60 | Temp 97.8°F | Wt 111.0 lb

## 2023-01-26 DIAGNOSIS — I4719 Other supraventricular tachycardia: Secondary | ICD-10-CM

## 2023-01-26 DIAGNOSIS — K52839 Microscopic colitis, unspecified: Secondary | ICD-10-CM

## 2023-01-26 DIAGNOSIS — K219 Gastro-esophageal reflux disease without esophagitis: Secondary | ICD-10-CM

## 2023-01-26 DIAGNOSIS — R1013 Epigastric pain: Secondary | ICD-10-CM

## 2023-01-26 NOTE — Progress Notes (Signed)
 Pt room by Ellan Lambert  HM DUE  No   Pt appointed w/ PCP - [facility] [place] [facility]

## 2023-01-26 NOTE — Progress Notes (Signed)
 Chief Complaint   Patient presents with    Abdominal Pain     Abdominal pain  upper quadrant area. Pt sees a GI for colitis but unsure if related. 3 days w/ discomfort when touches or moved around causes tenderness. No nauseas.           Subjective:     Paula Bowman is a 72 year old female who presents on 01/26/2023.    Pt presents for abdominal pain x 1 week, worse since yesterday. Hx of atrial tachycardia on metoprolol, HL on simvastatin, microscopic colitis, last flare 3 months ago but didn't start budesonide 3 weeks ago but stopped yesterday. Symptoms are improving.  Pain is rated at a 8/10. Pain is worse with movement, bending, exertion. Pain is better with laying down. Has had constipation over the past 3 days. Initially, she had some straining which has resolved now. Last bowel movement was this morning. Bowel movement was hard without any straining. She has chronic frequent belching. She has chills, lower than normal, mid thoracic back pain. She has chronic gas and bloating.     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   Cardiovascular:      Rate and Rhythm: Normal  rate and regular rhythm.   Pulmonary:      Effort: Pulmonary effort is normal.      Breath sounds: Normal breath sounds.   Abdominal:      General: Abdomen is flat and scaphoid. Bowel sounds are normal. There is no distension.      Palpations: Abdomen is soft.      Tenderness: There is no abdominal tenderness.   Neurological:      Mental Status: She is alert.       EKG with with sinus rhythm, PAC, HR 64         Assessment and Plan:      Kamiya was seen today for abdominal pain.    Epigastric pain  X 3 days. She has associated thoracic back pain.   Hx of microscopic colitis with recent use of budesonide over the past 3 weeks.   Cardiac risk factors include hyperlipidemia, atrial tachycardia, age  Consider CAD, gastritis, cholecystitis.  EKG stable in office.   Would recommended cardiac enzymes, continued EKG and possible imaging.   Referral [facility].   -  EKG 12-Lead; Future    Atrial tachycardia, paroxysmal (HCC)  On metoprolol  Considered in above decision making      Microscopic colitis, unspecified microscopic colitis type  Used budesonide x 3 weeks  Considered in above decision making      Gastroesophageal reflux disease without esophagitis  On Pepcid  Considered in above decision making

## 2023-01-29 NOTE — Result Encounter Note (Signed)
Sent to ER

## 2023-01-30 LAB — EKG 12 LEAD
Atrial Rate: 64 {beats}/min
P Axis: 83 degrees
P-R Interval: 126 ms
Q-T Interval: 404 ms
QRS Duration: 90 ms
QTC Calculation: 416 ms
R Axis: 55 degrees
T Axis: 78 degrees
Ventricular Rate: 64 {beats}/min

## 2023-01-30 NOTE — Result Encounter Note (Signed)
Sent to ER

## 2023-01-31 DIAGNOSIS — Z978 Presence of other specified devices: Secondary | ICD-10-CM | POA: Diagnosis not present

## 2023-01-31 DIAGNOSIS — E119 Type 2 diabetes mellitus without complications: Secondary | ICD-10-CM | POA: Diagnosis not present

## 2023-01-31 DIAGNOSIS — Z9641 Presence of insulin pump (external) (internal): Secondary | ICD-10-CM | POA: Diagnosis not present

## 2023-01-31 DIAGNOSIS — Z794 Long term (current) use of insulin: Secondary | ICD-10-CM | POA: Diagnosis not present

## 2023-02-04 DIAGNOSIS — E119 Type 2 diabetes mellitus without complications: Secondary | ICD-10-CM | POA: Diagnosis not present

## 2023-02-22 ENCOUNTER — Ambulatory Visit: Payer: PPO

## 2023-03-07 ENCOUNTER — Ambulatory Visit
Admission: RE | Admit: 2023-03-07 | Discharge: 2023-03-07 | Disposition: A | Discharge status: Home / Self Care | Payer: PPO | Source: Ambulatory Visit | Attending: Internal Medicine | Admitting: Internal Medicine

## 2023-03-07 DIAGNOSIS — Z1231 Encounter for screening mammogram for malignant neoplasm of breast: Secondary | ICD-10-CM

## 2023-03-07 DIAGNOSIS — E119 Type 2 diabetes mellitus without complications: Secondary | ICD-10-CM | POA: Diagnosis not present

## 2023-03-28 ENCOUNTER — Other Ambulatory Visit (HOSPITAL_BASED_OUTPATIENT_CLINIC_OR_DEPARTMENT_OTHER): Payer: Self-pay | Admitting: Internal Medicine

## 2023-03-28 DIAGNOSIS — Z1231 Encounter for screening mammogram for malignant neoplasm of breast: Secondary | ICD-10-CM

## 2023-04-02 ENCOUNTER — Encounter: Payer: Self-pay | Admitting: Internal Medicine

## 2023-04-02 ENCOUNTER — Ambulatory Visit (INDEPENDENT_AMBULATORY_CARE_PROVIDER_SITE_OTHER): Payer: PPO | Admitting: Internal Medicine

## 2023-04-02 VITALS — BP 120/70 | HR 71 | Temp 98.6°F | Ht 70.0 in | Wt 192.0 lb

## 2023-04-02 DIAGNOSIS — F4323 Adjustment disorder with mixed anxiety and depressed mood: Secondary | ICD-10-CM | POA: Diagnosis not present

## 2023-04-02 DIAGNOSIS — F419 Anxiety disorder, unspecified: Secondary | ICD-10-CM | POA: Diagnosis not present

## 2023-04-02 DIAGNOSIS — Z794 Long term (current) use of insulin: Secondary | ICD-10-CM

## 2023-04-02 DIAGNOSIS — E119 Type 2 diabetes mellitus without complications: Secondary | ICD-10-CM | POA: Diagnosis not present

## 2023-04-02 DIAGNOSIS — J329 Chronic sinusitis, unspecified: Secondary | ICD-10-CM

## 2023-04-02 DIAGNOSIS — B9689 Other specified bacterial agents as the cause of diseases classified elsewhere: Secondary | ICD-10-CM | POA: Diagnosis not present

## 2023-04-02 MED ORDER — DOXYCYCLINE HYCLATE 100 MG PO TABS: 100.0000 mg | ORAL_TABLET | Freq: Two times a day (BID) | ORAL | 1 refills | Status: DC

## 2023-04-02 MED ORDER — ALPRAZOLAM 1 MG PO TABS: ORAL_TABLET | ORAL | 1 refills | Status: DC

## 2023-04-02 NOTE — Assessment & Plan Note (Signed)
On Insulin pump/Dexcom 6

## 2023-04-02 NOTE — Progress Notes (Signed)
 Subjective:  Patient ID: Catherine Mora, female    DOB: [birth year]  Age: 73 y.o. MRN: [id number]  CC: Medical Management of Chronic Issues (3 mnth f/u)   HPI Catherine Mora presents for DM, anxiety, depressed mood C/o HAs daily  Outpatient Medications Prior to Visit  Medication Sig Dispense Refill   bisacodyl  (DULCOLAX) 5 MG EC tablet Take 1 tablet (5 mg total) by mouth daily as needed for moderate constipation. 30 tablet 3   Continuous Blood Gluc Sensor (DEXCOM G6 SENSOR) MISC Inject 1 sensor to the skin every 10 days for continuous glucose monitoring.     Continuous Blood Gluc Transmit (DEXCOM G6 TRANSMITTER) MISC Use as directed for continuous glucose monitoring. Reuse transmitter for 90 days then discard and replace.     DULoxetine  (CYMBALTA ) 20 MG capsule Take 1 capsule (20 mg total) by mouth 2 (two) times daily. 180 capsule 3   glimepiride (AMARYL) 2 MG tablet Take 2 mg by mouth every morning.     ibuprofen  (ADVIL ,MOTRIN ) 200 MG tablet Take 400 mg by mouth every 6 (six) hours as needed for headache.     insulin  detemir (LEVEMIR ) 100 UNIT/ML FlexPen Inject 16 Units into the skin 2 (two) times daily.     Insulin  Disposable Pump (OMNIPOD DASH PODS, GEN 4,) MISC SMARTSIG:SUB-Q Every 3 Days     Insulin  Pen Needle 31G X 5 MM MISC Use with Levemir  pen 100 each 0   LANTUS  SOLOSTAR 100 UNIT/ML Solostar Pen Inject 22 Units into the skin daily.     lisinopril (ZESTRIL) 10 MG tablet Take 1 tablet by mouth daily.     lovastatin (MEVACOR) 20 MG tablet TAKE 1 TABLET BY MOUTH EVERY DAY 90 tablet 3   nitrofurantoin , macrocrystal-monohydrate, (MACROBID ) 100 MG capsule Take 1 capsule (100 mg total) by mouth 2 (two) times daily. 14 capsule 1   NOVOLOG  FLEXPEN 100 UNIT/ML FlexPen TAKE AS DIRECTED PER SLIDING SCALE. MAX DAILY DOSE: 50 UNITS/DAY.  3   ONETOUCH VERIO test strip 1 each by Other route 2 (two) times daily.      pantoprazole  (PROTONIX ) 40 MG tablet TAKE 1 TABLET BY MOUTH EVERY DAY 90  tablet 1   Polyethyl Glycol-Propyl Glycol (SYSTANE FREE OP) Apply to eye.     scopolamine  (TRANSDERM-SCOP) 1 MG/3DAYS Place 1 patch (1.5 mg total) onto the skin every 3 (three) days. 4 patch 0   ALPRAZolam  (XANAX ) 1 MG tablet Take 0.5 mg in morning and 1 mg at night prn 135 tablet 1   OZEMPIC, 0.25 OR 0.5 MG/DOSE, 2 MG/3ML SOPN Inject 0.5 mg into the skin once a week.     BIOTIN FORTE PO Take 500 mg by mouth daily. (Patient not taking: Reported on 04/02/2023)     No facility-administered medications prior to visit.    ROS: Review of Systems  Constitutional:  Negative for activity change, appetite change, chills, fatigue and unexpected weight change.  HENT:  Negative for congestion, mouth sores and sinus pressure.   Eyes:  Negative for visual disturbance.  Respiratory:  Negative for cough and chest tightness.   Gastrointestinal:  Negative for abdominal pain and nausea.  Genitourinary:  Negative for difficulty urinating, frequency and vaginal pain.  Musculoskeletal:  Negative for back pain and gait problem.  Skin:  Negative for pallor and rash.  Neurological:  Negative for dizziness, tremors, weakness, numbness and headaches.  Psychiatric/Behavioral:  Negative for confusion and sleep disturbance.     Objective:  BP 120/70 (BP Location: Left  Arm, Patient Position: Sitting, Cuff Size: Normal)   Pulse 71   Temp 98.6 F (37 C) (Oral)   Ht 5\' 10"  (1.778 m)   Wt 192 lb (87.1 kg)   SpO2 92%   BMI 27.55 kg/m   BP Readings from Last 3 Encounters:  04/02/23 120/70  12/28/22 116/70  09/27/22 110/70    Wt Readings from Last 3 Encounters:  04/02/23 192 lb (87.1 kg)  12/28/22 182 lb (82.6 kg)  09/27/22 179 lb (81.2 kg)    Physical Exam Constitutional:      General: She is not in acute distress.    Appearance: Normal appearance. She is well-developed. She is obese.  HENT:     Head: Normocephalic.     Right Ear: External ear normal.     Left Ear: External ear normal.     Nose:  Nose normal.  Eyes:     General:        Right eye: No discharge.        Left eye: No discharge.     Conjunctiva/sclera: Conjunctivae normal.     Pupils: Pupils are equal, round, and reactive to light.  Neck:     Thyroid : No thyromegaly.     Vascular: No JVD.     Trachea: No tracheal deviation.  Cardiovascular:     Rate and Rhythm: Normal rate and regular rhythm.     Heart sounds: Normal heart sounds.  Pulmonary:     Effort: No respiratory distress.     Breath sounds: No stridor. No wheezing.  Abdominal:     General: Bowel sounds are normal. There is no distension.     Palpations: Abdomen is soft. There is no mass.     Tenderness: There is no abdominal tenderness. There is no guarding or rebound.  Musculoskeletal:        General: No tenderness.     Cervical back: Normal range of motion and neck supple. No rigidity.  Lymphadenopathy:     Cervical: No cervical adenopathy.  Skin:    Findings: No erythema or rash.  Neurological:     Cranial Nerves: No cranial nerve deficit.     Motor: No abnormal muscle tone.     Coordination: Coordination normal.     Deep Tendon Reflexes: Reflexes normal.  Psychiatric:        Behavior: Behavior normal.        Thought Content: Thought content normal.        Judgment: Judgment normal.     Lab Results  Component Value Date   WBC 6.2 01/26/2022   HGB 13.0 01/26/2022   HCT 38.9 01/26/2022   PLT 270.0 01/26/2022   GLUCOSE 153 (H) 01/26/2022   CHOL 221 (H) 01/26/2022   TRIG 159.0 (H) 01/26/2022   HDL 47.60 01/26/2022   LDLDIRECT 219.0 08/14/2014   LDLCALC 142 (H) 01/26/2022   ALT 14 01/26/2022   AST 18 01/26/2022   NA 139 01/26/2022   K 4.4 01/26/2022   CL 102 01/26/2022   CREATININE 1.30 (H) 01/26/2022   BUN 31 (H) 01/26/2022   CO2 26 01/26/2022   TSH 3.40 01/26/2022   HGBA1C 6.9 (H) 03/28/2021   MICROALBUR 1.9 03/28/2021    MM 3D SCREENING MAMMOGRAM BILATERAL BREAST Result Date: 03/08/2023 CLINICAL DATA:  Screening. EXAM:  DIGITAL SCREENING BILATERAL MAMMOGRAM WITH TOMOSYNTHESIS AND CAD TECHNIQUE: Bilateral screening digital craniocaudal and mediolateral oblique mammograms were obtained. Bilateral screening digital breast tomosynthesis was performed. The images were evaluated with computer-aided detection.  COMPARISON:  Previous exam(s). ACR Breast Density Category b: There are scattered areas of fibroglandular density. FINDINGS: There are no findings suspicious for malignancy. IMPRESSION: No mammographic evidence of malignancy. A result letter of this screening mammogram will be mailed directly to the patient. RECOMMENDATION: Screening mammogram in one year. (Code:SM-B-01Y) BI-RADS CATEGORY  1: Negative. Electronically Signed   By: Anna Barnes M.D.   On: 03/08/2023 07:14    Assessment & Plan:   Problem List Items Addressed This Visit     Adjustment disorder with mixed anxiety and depressed mood   Grieving      Type 2 diabetes mellitus without complication (HCC)    On Insulin  pump/Dexcom 6      Relevant Medications   LANTUS  SOLOSTAR 100 UNIT/ML Solostar Pen   Anxiety   Cont w/prn Xanax   Potential benefits of a long term benzodiazepines  use as well as potential risks  and complications were explained to the patient and were aknowledged.      Relevant Medications   ALPRAZolam  (XANAX ) 1 MG tablet   Sinusitis, bacterial - Primary   Head CT if not better Treat w/abx      Relevant Medications   doxycycline  (VIBRA -TABS) 100 MG tablet      Meds ordered this encounter  Medications   doxycycline  (VIBRA -TABS) 100 MG tablet    Sig: Take 1 tablet (100 mg total) by mouth 2 (two) times daily.    Dispense:  28 tablet    Refill:  1   ALPRAZolam  (XANAX ) 1 MG tablet    Sig: Take 0.5 mg in morning and 1 mg at night prn    Dispense:  135 tablet    Refill:  1      Follow-up: Return in about 3 months (around 06/30/2023) for a follow-up visit.  Anitra Barn, MD

## 2023-04-02 NOTE — Assessment & Plan Note (Signed)
 Head CT if not better Treat w/abx

## 2023-04-02 NOTE — Assessment & Plan Note (Signed)
Cont w/prn Xanax  Potential benefits of a long term benzodiazepines  use as well as potential risks  and complications were explained to the patient and were aknowledged.

## 2023-04-02 NOTE — Assessment & Plan Note (Signed)
Grieving  

## 2023-04-07 DIAGNOSIS — E119 Type 2 diabetes mellitus without complications: Secondary | ICD-10-CM | POA: Diagnosis not present

## 2023-04-16 ENCOUNTER — Other Ambulatory Visit: Payer: Self-pay | Admitting: Internal Medicine

## 2023-05-03 DIAGNOSIS — Z9641 Presence of insulin pump (external) (internal): Secondary | ICD-10-CM | POA: Diagnosis not present

## 2023-05-03 DIAGNOSIS — Z794 Long term (current) use of insulin: Secondary | ICD-10-CM | POA: Diagnosis not present

## 2023-05-03 DIAGNOSIS — E119 Type 2 diabetes mellitus without complications: Secondary | ICD-10-CM | POA: Diagnosis not present

## 2023-05-05 DIAGNOSIS — E119 Type 2 diabetes mellitus without complications: Secondary | ICD-10-CM | POA: Diagnosis not present

## 2023-05-12 ENCOUNTER — Emergency Department: Payer: Self-pay

## 2023-05-15 ENCOUNTER — Telehealth (INDEPENDENT_AMBULATORY_CARE_PROVIDER_SITE_OTHER): Payer: Self-pay

## 2023-05-15 NOTE — Telephone Encounter (Signed)
 Pt called the [facility] line after receiving a recall letter. Scheduled next available late morning appt w/ PA  Lorbeski.     Completed recall. Nothing further needed at this time, closing.

## 2023-05-18 ENCOUNTER — Telehealth (INDEPENDENT_AMBULATORY_CARE_PROVIDER_SITE_OTHER): Admitting: Medical

## 2023-05-18 ENCOUNTER — Encounter (INDEPENDENT_AMBULATORY_CARE_PROVIDER_SITE_OTHER): Payer: Self-pay

## 2023-05-18 ENCOUNTER — Encounter (INDEPENDENT_AMBULATORY_CARE_PROVIDER_SITE_OTHER): Payer: Self-pay | Admitting: Medical

## 2023-05-18 ENCOUNTER — Ambulatory Visit (INDEPENDENT_AMBULATORY_CARE_PROVIDER_SITE_OTHER): Payer: Self-pay | Admitting: Internal Medicine

## 2023-05-18 DIAGNOSIS — R1312 Dysphagia, oropharyngeal phase: Secondary | ICD-10-CM

## 2023-05-18 DIAGNOSIS — K5289 Other specified noninfective gastroenteritis and colitis: Secondary | ICD-10-CM

## 2023-05-18 DIAGNOSIS — K8020 Calculus of gallbladder without cholecystitis without obstruction: Secondary | ICD-10-CM | POA: Insufficient documentation

## 2023-05-18 DIAGNOSIS — R4789 Other speech disturbances: Secondary | ICD-10-CM

## 2023-05-18 DIAGNOSIS — C50919 Malignant neoplasm of unspecified site of unspecified female breast: Secondary | ICD-10-CM | POA: Insufficient documentation

## 2023-05-18 DIAGNOSIS — Z8679 Personal history of other diseases of the circulatory system: Secondary | ICD-10-CM | POA: Insufficient documentation

## 2023-05-18 DIAGNOSIS — R112 Nausea with vomiting, unspecified: Secondary | ICD-10-CM

## 2023-05-18 HISTORY — DX: Malignant neoplasm of unspecified site of unspecified female breast: C50.919

## 2023-05-18 HISTORY — DX: Other specified noninfective gastroenteritis and colitis: K52.89

## 2023-05-18 NOTE — Telephone Encounter (Signed)
 Concern: appt booked for slurred speech.    Called [place]. She states "I'm not going to the ER. I've been c/o slurred speech for over a year and a half, it doesn't come and go, it's just getting progressively worse, I talk like I have cotton in my mouth, I have a very dry mouth, I've been sent to a neurologist over a year and a half ago, they can't find anything, blood works comes back with nothing. Yesterday I was standing in [employer] I couldn't believe how my speech was slurring, it's slurring right now with you, maybe there's someone else you can send me to or another specialist or something else I can do, no one can figure it out. I was just in the ER for other issues, I didn't tell them about my speech."    Paula Bowman denies unilateral weakness, states she has wrist concerns and needs surgery, denies facial drooping, headache, difficulty walking, and I began to list other sx of stroke and she states she knows what to watch out for stroke. States she has to emphasize "s" and states "it's a different type of speaking, its getting worse". States she went to ED on 3/22 for vomiting. Has had intermittent vomiting but has not vomited since last Saturday night. Concerned with weight loss, states she is only 104 pounds now (lasts recorded weight was 1/11 on 12/6). She is concerned that this all might be related.    Speech is understandable on the phone, no pauses for breaths, speaking in full sentences. Alert and oriented to time and place. I do not hear audible slurring but I advised her it's very hard to assess over the phone so I cannot rule out slurred speech.    PLAN: go to ED now. I advised that we recommend she go to the emergency room immediately given her concern for slurred speech to rule out anything more serious, she again declines the emergency room and states they can never figure it out and she wants to do a telehealth first with Renee. She states "if [name] says I need to go to the ER, I will go." I adv in person  would be better since she's declining ER but she states that in person won't be much better, she just wants to talk with [name] to figure out next steps.    Routed high priority to provider.    Advised patient to call 911 or go to closest emergency room if life threatening symptoms occur.    Reason for Disposition   Patient sounds very sick or weak to the triager   Neurologic deficit that was brief (now gone), ANY of the following: * Weakness of the face, arm, or leg on one side of the body * Numbness of the face, arm, or leg on one side of the body * Loss of speech or garbled speech    Protocols used: Neurologic Deficit-ADULT-[place]

## 2023-05-18 NOTE — Telephone Encounter (Signed)
 Pt self appointed  for [facility] visit with reason of " Slurred speech"- yesterday @ 11 am       Routing to nurse triage to please evaluate and re direct to ER if more appropriate.    No other details relayed regarding event

## 2023-05-18 NOTE — Progress Notes (Signed)
 Distant Site Telemedicine Encounter    Distant Site Telemedicine Encounter  I conducted this encounter via secure, live, face-to-face video conference with the patient. I reviewed the risks and benefits of telemedicine as pertinent to this visit and the patient agreed to proceed.    Provider Location: On-site location (clinic, hospital, on-site office)  Patient Location: At home    Present with patient: Family Member        Date of Visit: 05/18/2023    Patient: Paula Bowman  MRN: [id number]  PCP: Avie Echevaria, MD    Paula Bowman is a 73 year old female who presents for   Chief Complaint   Patient presents with    Follow-Up      Slurred Speech  - pt didn't gave me more info - she just want to talk to provider        .    Subjective:       History of Present Illness:  Paula Bowman is a 73 year old female who has speech changes "lisping", admits dry mouth, difficulty swallowing. Admits drainage from sinuses.   Took Flonase for several years for sinus drainage, seen at ENT, cautery done, off flonase.     Spoke with triage RN reports symptom x1.5y, getting worse, she's also concerned w/weight loss and vomiting (no emesis since Saturday)    States lisping worse. Napping daily, more fatigue. Vomiting again over last weekend, concerning wt loss. Slightly improved through the week. Appetite improved yesterday. Bout of diarrhea last week. Last BM yesterday normal.    Review of Systems   Constitutional:  Positive for fatigue and unexpected weight change.   HENT:  Positive for postnasal drip and trouble swallowing.         Dry mouth   Respiratory:  Negative for shortness of breath.    Cardiovascular:  Positive for palpitations. Negative for chest pain.   Gastrointestinal:  Positive for diarrhea, nausea and vomiting. Negative for abdominal distention and abdominal pain.   Neurological:  Positive for speech difficulty and light-headedness (years). Negative for dizziness, tremors, syncope,  facial asymmetry, weakness, numbness and headaches.       The following portions of the patient's history were reviewed with the patient and updated as appropriate: problem list, current medications, allergies.       Objective:      General: well appearing, no acute distress     Laboratory/Imaging/Pathology:  Reviewed last labs Yes, 05/2022    Assessment and Plan:        Paula Bowman was seen today for follow-up .    Nausea and vomiting, unspecified vomiting type  -     Lipase  -     Amylase; Future  -     Hepatic Function Panel; Future  -     CBC with Diff; Future  -     Referral to General Surgery; Future    Calculus of gallbladder without cholecystitis without obstruction  Comments:  advise bland diet, report if pain, fever, recurrent vomiting.  Assessment & Plan:  Noted on CT 05/12/22 Denies abd pain or fever, refer to surgeon for consult, lipase/amylase and repeat LFT's ordered.     Orders:  -     Referral to General Surgery; Future    Episode of change in speech  Comments:  admits for years but progressively worsening with fatigue/dry mouth (eval for sjgrens in past negative)  Orders:  -     Referral to Speech Therapy; Future  Oropharyngeal dysphagia  Comments:  refer to speech pathology, testing if recommended.  Orders:  -     Referral to Speech Therapy; Future      Follow up: We will have the patient return to clinic for recheck in 1 week to f/u vomiting, to UC or ED if pain or worsens.      Paula Pu, PA-C

## 2023-05-18 NOTE — Progress Notes (Signed)
 Patient roomed by Toni Amend Orland Jarred) D CMA  Accompanied by virtually  .  Allergies , Medications and smoking hx verified.    Reason for visit: Slurred Speech  - pt didn't gave me more info - she just want to talk to provider     Health Maintenance due      PHQ 2: pt declined to answer       Vaccinations due :    Vaccine  updated     HM Due:   Health Maintenance   Topic Date Due    COVID-19 Vaccine (8 - 2024-25 season) 10/22/2022    Influenza Vaccine (1) 11/21/2022    Depression Screening (PHQ-2)  06/01/2023    DTaP, Tdap and Td Vaccines (2 - Td or Tdap) 11/28/2023    Breast Cancer Screening  05/24/2024    Medicare Annual Wellness Visit  06/25/2024    Colorectal Cancer Screening  09/14/2024    Lipid Disorders Screening  06/13/2027    Osteoporosis Screening  Completed    Pneumococcal Vaccine: 50+ Years  Completed    Zoster Vaccine  Completed    Hepatitis C Screening  Addressed    Hepatitis A Vaccine  Aged Out    Hepatitis B Vaccine  Aged Out    Meningococcal B Vaccine  Aged Out    HPV Vaccine  Aged Out              Future Appointments   Date Time Provider Department Center   06/01/2023 10:40 AM [id number]   06/26/2023 10:00 AM Clovis Pu, PA-C [facility]

## 2023-05-18 NOTE — Assessment & Plan Note (Signed)
 Noted on CT 05/12/22 Denies abd pain or fever, refer to surgeon for consult, lipase/amylase and repeat LFT's ordered.

## 2023-05-20 ENCOUNTER — Other Ambulatory Visit (INDEPENDENT_AMBULATORY_CARE_PROVIDER_SITE_OTHER)

## 2023-05-20 DIAGNOSIS — R112 Nausea with vomiting, unspecified: Secondary | ICD-10-CM

## 2023-05-20 LAB — HEPATIC FUNCTION PANEL
ALT (GPT): 21 U/L (ref 7–33)
AST (GOT): 24 U/L (ref 9–38)
Albumin: 4.5 g/dL (ref 3.5–5.2)
Alkaline Phosphatase (Total): 48 U/L (ref 38–172)
Bilirubin (Direct): 0.1 mg/dL (ref 0.0–0.3)
Bilirubin (Total): 0.5 mg/dL (ref 0.2–1.3)
Protein (Total): 7.2 g/dL (ref 6.0–8.2)

## 2023-05-20 LAB — AMYLASE: Amylase Total: 56 U/L (ref 27–106)

## 2023-05-20 LAB — CBC, DIFF
% Basophils: 0 %
% Eosinophils: 6 %
% Immature Granulocytes: 0 %
% Lymphocytes: 22 %
% Monocytes: 7 %
% Neutrophils: 65 %
% Nucleated RBC: 0 %
Absolute Eosinophil Count: 0.48 10*3/uL (ref 0.00–0.50)
Absolute Lymphocyte Count: 1.75 10*3/uL (ref 1.00–4.80)
Basophils: 0.03 10*3/uL (ref 0.00–0.20)
Hematocrit: 43 % (ref 36.0–45.0)
Hemoglobin: 14.4 g/dL (ref 11.5–15.5)
Immature Granulocytes: 0.03 10*3/uL (ref 0.00–0.05)
MCH: 31.9 pg (ref 27.3–33.6)
MCHC: 33.3 g/dL (ref 32.2–36.5)
MCV: 96 fL (ref 81–98)
Monocytes: 0.52 10*3/uL (ref 0.00–0.80)
Neutrophils: 4.99 10*3/uL (ref 1.80–7.00)
Nucleated RBC: 0 10*3/uL
Platelet Count: 213 10*3/uL (ref 150–400)
RBC: 4.52 10*6/uL (ref 3.80–5.00)
RDW-CV: 12.4 % (ref 11.0–14.5)
WBC: 7.8 10*3/uL (ref 4.3–10.0)

## 2023-05-20 LAB — LIPASE: Lipase: 34 U/L (ref <70)

## 2023-05-22 NOTE — Result Encounter Note (Signed)
 Reviewed results, all normal limits.

## 2023-05-30 ENCOUNTER — Encounter (HOSPITAL_BASED_OUTPATIENT_CLINIC_OR_DEPARTMENT_OTHER): Payer: Self-pay

## 2023-06-01 ENCOUNTER — Ambulatory Visit
Admission: RE | Admit: 2023-06-01 | Discharge: 2023-06-01 | Disposition: A | Discharge status: Home / Self Care | Payer: Medicare PPO | Attending: Student in an Organized Health Care Education/Training Program | Admitting: Student in an Organized Health Care Education/Training Program

## 2023-06-01 ENCOUNTER — Encounter (HOSPITAL_BASED_OUTPATIENT_CLINIC_OR_DEPARTMENT_OTHER): Payer: Self-pay

## 2023-06-01 DIAGNOSIS — Z1231 Encounter for screening mammogram for malignant neoplasm of breast: Secondary | ICD-10-CM | POA: Insufficient documentation

## 2023-06-05 ENCOUNTER — Encounter (INDEPENDENT_AMBULATORY_CARE_PROVIDER_SITE_OTHER): Payer: Self-pay | Admitting: Internal Medicine

## 2023-06-05 DIAGNOSIS — E119 Type 2 diabetes mellitus without complications: Secondary | ICD-10-CM | POA: Diagnosis not present

## 2023-06-07 ENCOUNTER — Ambulatory Visit

## 2023-06-12 ENCOUNTER — Encounter (INDEPENDENT_AMBULATORY_CARE_PROVIDER_SITE_OTHER): Payer: Self-pay

## 2023-06-12 ENCOUNTER — Other Ambulatory Visit (INDEPENDENT_AMBULATORY_CARE_PROVIDER_SITE_OTHER): Payer: Self-pay | Admitting: Medical

## 2023-06-12 DIAGNOSIS — R4789 Other speech disturbances: Secondary | ICD-10-CM

## 2023-06-12 DIAGNOSIS — R1312 Dysphagia, oropharyngeal phase: Secondary | ICD-10-CM

## 2023-06-13 ENCOUNTER — Other Ambulatory Visit (INDEPENDENT_AMBULATORY_CARE_PROVIDER_SITE_OTHER): Payer: Self-pay | Admitting: Medical

## 2023-06-13 DIAGNOSIS — K8020 Calculus of gallbladder without cholecystitis without obstruction: Secondary | ICD-10-CM

## 2023-06-13 DIAGNOSIS — R112 Nausea with vomiting, unspecified: Secondary | ICD-10-CM

## 2023-06-26 ENCOUNTER — Encounter (INDEPENDENT_AMBULATORY_CARE_PROVIDER_SITE_OTHER): Payer: Self-pay | Admitting: Medical

## 2023-06-26 ENCOUNTER — Ambulatory Visit (INDEPENDENT_AMBULATORY_CARE_PROVIDER_SITE_OTHER): Admitting: Medical

## 2023-06-26 VITALS — BP 86/52 | HR 73 | Temp 97.0°F | Resp 15 | Ht 68.0 in | Wt 108.0 lb

## 2023-06-26 LAB — BASIC METABOLIC PANEL
Anion Gap: 9 (ref 4–12)
Calcium: 9.7 mg/dL (ref 8.9–10.2)
Carbon Dioxide, Total: 28 meq/L (ref 22–32)
Chloride: 104 meq/L (ref 98–108)
Creatinine: 0.67 mg/dL (ref 0.38–1.02)
Glucose: 85 mg/dL (ref 62–125)
Potassium: 5.4 meq/L — ABNORMAL HIGH (ref 3.6–5.2)
Sodium: 141 meq/L (ref 135–145)
Urea Nitrogen: 20 mg/dL (ref 8–21)
eGFR by CKD-EPI 2021: >60 mL/min/{1.73_m2} (ref >59)

## 2023-06-26 MED ORDER — CARVEDILOL 3.125 MG OR TABS
3.1250 mg | ORAL_TABLET | Freq: Every evening | ORAL | 2 refills | Status: AC
Start: 2023-06-26 — End: ?

## 2023-06-26 NOTE — Assessment & Plan Note (Signed)
 Increase fluids, avoid quick position changes, change Metoprolol to Carvedilol, monitor and report new sx/concerns

## 2023-06-26 NOTE — Patient Instructions (Addendum)
 Avoid dehydration, drink more water 48-64 oz per day, high calorie diet but low fat. Avoid quick change in position w/o pause to avoid dizziness/lightheadedness.     Eating well balanced meals, high in fiber, low in cholesterol and low in fat, limiting simple sugars.   2.   Moderate exercise for 30 minutes 5 days a week. Examples brisk walking, swimming or bicycling.   3.   Getting plenty of rest 7-8 hours per night.   4.   Drinking 64oz of water per day.   5.   Keeping regular appointments for Eye, Dental and Wellness exams.   6.   Wear sunscreen or UV protective clothing and hats.

## 2023-06-26 NOTE — Assessment & Plan Note (Signed)
 Continue low fat/low cholesterol diet, report RUQ pain, nausea/vomiting/fever.

## 2023-06-26 NOTE — Progress Notes (Signed)
 ANNUAL WELLNESS VISIT        Paula Bowman is a 73 year old female who presents for a subsequent Annual Wellness Visit.    Is this a face-to-face video or an audio-only visit?  No       INFORMATION GATHERING:      The following areas were confirmed with patient/caregiver and/or updated in Epic at this visit:   Past Medical, Surgical, Family, and Social History  Current medications and supplements  Allergies  All of the above components have been reviewed and updated: yes    Opioid Use:   Patient not taking opioids       I have not reviewed the patient's risk factors for other substance use disorders.  If appropriate, I've referred the patient for treatment.      Please see today's HRA for review of patient's functional ability and level of safety. Concerns are addressed below in the Personalized Prevention Plan section.    For list of current providers and suppliers, see Care Team Section, EHR encounters, and/or HRA questionnaire for [facility] providers involved in care    Other providers and suppliers outside [facility]:   Identified outside providers not included in records above (please specify): [facility]       Depression screening:    PHQ-9: (Patient-Rptd) 3     EXAM:     BP (!) 86/52   Pulse 73   Temp 36.1 C (Temporal)   Resp 15   Ht 5' 8 (1.727 m)   Wt 49 kg (108 lb)   SpO2 100%   BMI 16.42 kg/m         GAIT:   Normal, stable, independent    COGNITION:       Intact          ADVANCE CARE PLANNING (ACP)       Advance care planning:  Offered, patient declined    No advance directive discussion during this visit    ASSESSMENT:       Paula Bowman was seen for her Annual Wellness Visit, including identification of risk factors & conditions that may affect her health and function in the future.         Paula Bowman was seen today for wellness.    Encounter for well adult exam with abnormal findings    Atrial tachycardia, paroxysmal (HCC)  Assessment & Plan:  Stable/rare recurrent events,  change metoprolol to carvedilol low dose due to low blood pressures. Start low dose once daily. Continue to monitor/will adjust if indicated for AT.     Orders:  -     carVEDilol 3.125 MG tablet; Take 1 tablet (3.125 mg) by mouth at bedtime.  Dispense: 30 tablet; Refill: 2    Microscopic colitis, unspecified microscopic colitis type  Comments:  following GI, avoid cruciferous foods    Screening for cardiovascular condition  -     Basic Metabolic Panel; Future    Calculus of gallbladder without cholecystitis without obstruction  Assessment & Plan:  Continue low fat/low cholesterol diet, report RUQ pain, nausea/vomiting/fever.         Actions at this visit:      Established or updated a written schedule of screening and prevention measures recommended and appropriate for [name] for the next 5-10 years  Established or updated a list of her risk factors and conditions for which lifestyle or medical interventions are recommended or underway, including mental health risks and conditions, and including risks/benefits of treatment  Furnished personalized health advice and, as appropriate, referrals to health education or preventive counseling services or programs (such as fall prevention, tobacco cessation, physical activity, nutrition, cognition, weight loss)    All of the above components have been reviewed and updated. yes    Personalized Prevention Plan:      Based on today's evaluation, I recommend the following ways to improve your health or functioning.    Eating well balanced meals, high in fiber, low in cholesterol and low in fat, limiting simple sugars.   2.   Moderate exercise for 30 minutes 5 days a week. Examples brisk walking, swimming or bicycling.   3.   Getting plenty of rest 7-8 hours per night.   4.   Drinking 64oz of water per day.   5.   Keeping regular appointments for Eye, Dental and Wellness exams.   6.   Wear sunscreen or UV protective clothing and hats.     You have the following  risk factors and/or medical conditions for which there are recommended ways (included in this list) to help you stay as healthy as possible:  Colitis and gallstones, avoid cruciferous, spicy/greasy foods. Follow up with GI as planned.     Here are screening & prevention measures recommended for you:  Health Maintenance   Topic Date Due    Medicare Annual Wellness Visit  06/02/2023    DTaP, Tdap and Td Vaccines (2 - Td or Tdap) 11/28/2023    Depression Screening (PHQ-2)  06/25/2024    Colorectal Cancer Screening  09/14/2024    Breast Cancer Screening  05/31/2025    Lipid Disorders Screening  06/13/2027    Osteoporosis Screening  Completed    Pneumococcal Vaccine: 50+ Years  Completed    Zoster Vaccine  Completed    Influenza Vaccine  Completed    COVID-19 Vaccine  Completed    Hepatitis C Screening  Addressed    Hepatitis A Vaccine  Aged Out    Hepatitis B Vaccine  Aged Out    Meningococcal B Vaccine  Aged Out    HPV Vaccine  Aged Out   Gallstones consult with surgeon wants to do surgery(hx/o colitis-current flare caused wt loss, states in remission-appt with GI)  Having severe RLQ pain in waves/better after having gas.       Please plan to have a Subsequent Annual Wellness Visit in 1 year.    Answers submitted by the patient for this visit:  Health Screening: Depression (Submitted on 06/26/2023)  PHQ2 Score: 2  Health Screening: Depression (Submitted on 06/26/2023)  PHQ9 SCORE: 3

## 2023-06-26 NOTE — Assessment & Plan Note (Addendum)
 Stable/rare recurrent events, change metoprolol to carvedilol low dose due to low blood pressures. Start low dose once daily. Continue to monitor/will adjust if indicated for AT.

## 2023-06-26 NOTE — Progress Notes (Signed)
 Pt roomed by Idalis Hoelting B.  Pt accompanied by:  Self     Health Maintenance Due   Topic    Medicare Annual Wellness Visit - Today      Answers submitted by the patient for this visit:  Health Screening: Depression (Submitted on 06/26/2023)  PHQ2 Score: 2  Health Screening: Depression (Submitted on 06/26/2023)  PHQ9 SCORE: 3

## 2023-06-26 NOTE — Addendum Note (Signed)
 Addended by: Marshelle Bilger M on: 06/26/2023 12:00 PM     Modules accepted: Level of Service

## 2023-06-27 NOTE — Result Encounter Note (Signed)
 Reviewed results, CMP normal limits except mildly elevated Kcl.

## 2023-06-30 ENCOUNTER — Other Ambulatory Visit (INDEPENDENT_AMBULATORY_CARE_PROVIDER_SITE_OTHER): Payer: Self-pay | Admitting: Medical

## 2023-06-30 DIAGNOSIS — E782 Mixed hyperlipidemia: Secondary | ICD-10-CM

## 2023-07-02 ENCOUNTER — Ambulatory Visit: Payer: PPO | Admitting: Internal Medicine

## 2023-07-03 DIAGNOSIS — L900 Lichen sclerosus et atrophicus: Secondary | ICD-10-CM | POA: Diagnosis not present

## 2023-07-03 DIAGNOSIS — N760 Acute vaginitis: Secondary | ICD-10-CM | POA: Diagnosis not present

## 2023-07-03 DIAGNOSIS — Z6828 Body mass index (BMI) 28.0-28.9, adult: Secondary | ICD-10-CM | POA: Diagnosis not present

## 2023-07-03 DIAGNOSIS — N952 Postmenopausal atrophic vaginitis: Secondary | ICD-10-CM | POA: Diagnosis not present

## 2023-07-03 DIAGNOSIS — Z1272 Encounter for screening for malignant neoplasm of vagina: Secondary | ICD-10-CM | POA: Diagnosis not present

## 2023-07-03 DIAGNOSIS — Z124 Encounter for screening for malignant neoplasm of cervix: Secondary | ICD-10-CM | POA: Diagnosis not present

## 2023-07-03 DIAGNOSIS — Z113 Encounter for screening for infections with a predominantly sexual mode of transmission: Secondary | ICD-10-CM | POA: Diagnosis not present

## 2023-07-03 MED ORDER — SIMVASTATIN 10 MG OR TABS
5.0000 mg | ORAL_TABLET | Freq: Every day | ORAL | 3 refills | Status: AC
Start: 2023-07-03 — End: ?

## 2023-07-03 NOTE — Telephone Encounter (Signed)
 Per 10/05/22 telephone encounter - pt increased dose to 10mg 

## 2023-07-05 DIAGNOSIS — E119 Type 2 diabetes mellitus without complications: Secondary | ICD-10-CM | POA: Diagnosis not present

## 2023-07-15 ENCOUNTER — Other Ambulatory Visit: Payer: Self-pay

## 2023-07-15 ENCOUNTER — Emergency Department (HOSPITAL_BASED_OUTPATIENT_CLINIC_OR_DEPARTMENT_OTHER)
Admission: EM | Admit: 2023-07-15 | Discharge: 2023-07-15 | Disposition: A | Discharge status: Home / Self Care | Attending: Emergency Medicine | Admitting: Emergency Medicine

## 2023-07-15 ENCOUNTER — Encounter (HOSPITAL_BASED_OUTPATIENT_CLINIC_OR_DEPARTMENT_OTHER): Payer: Self-pay | Admitting: Emergency Medicine

## 2023-07-15 DIAGNOSIS — Z79899 Other long term (current) drug therapy: Secondary | ICD-10-CM | POA: Diagnosis not present

## 2023-07-15 DIAGNOSIS — E119 Type 2 diabetes mellitus without complications: Secondary | ICD-10-CM | POA: Diagnosis not present

## 2023-07-15 DIAGNOSIS — L247 Irritant contact dermatitis due to plants, except food: Secondary | ICD-10-CM | POA: Insufficient documentation

## 2023-07-15 DIAGNOSIS — Z794 Long term (current) use of insulin: Secondary | ICD-10-CM | POA: Diagnosis not present

## 2023-07-15 DIAGNOSIS — I10 Essential (primary) hypertension: Secondary | ICD-10-CM | POA: Insufficient documentation

## 2023-07-15 DIAGNOSIS — R21 Rash and other nonspecific skin eruption: Secondary | ICD-10-CM | POA: Diagnosis present

## 2023-07-15 MED ORDER — PREDNISONE 10 MG PO TABS
60.0000 mg | ORAL_TABLET | Freq: Once | ORAL | Status: AC
  Administered 2023-07-15: 60 mg via ORAL
  Filled 2023-07-15: qty 1

## 2023-07-15 MED ORDER — PREDNISONE 10 MG PO TABS: 40.0000 mg | ORAL_TABLET | Freq: Every day | ORAL | 0 refills | Status: AC

## 2023-07-15 NOTE — ED Triage Notes (Signed)
 Pt with rash that started on posterior neck and is now spreading; c/o itching

## 2023-07-15 NOTE — Discharge Instructions (Addendum)
 Suspect poison ivy contact dermatitis.  Take the prednisone  as directed.  First dose provided here today.  Start with the prescription dose tomorrow and take that for the next 5 days.  Follow-up with your doctor as needed.  Follow your blood sugars very carefully while on the prednisone .  It does tend to increase your blood glucose level.  Return for any new or worse symptoms.

## 2023-07-15 NOTE — ED Provider Notes (Signed)
 [facility] Provider Note   CSN: [id number] Arrival date & time: 07/15/23  1203     History  Chief Complaint  Patient presents with   Rash    Catherine Mora is a 73 y.o. female.  Patient started with the breakout of a rash yesterday.  Was kind of below the ear on the right side of her neck and now its underneath her chin.  There is a few specks on her lower extremities as well.  Patient a little suspicious this could be poison ivy.  She does have dogs that do run in the woods.  When she has not been in the woods for self.  Patient's past medical history is significant for diabetes.  Hyperlipidemia hypertension irritable bowel syndrome.  Patient does take insulin  for the diabetes past surgical history significant for gallbladder removal and abdominal hysterectomy.  Patient denies any fevers denies any nausea vomiting abdominal pain chest pain.       Home Medications Prior to Admission medications   Medication Sig Start Date End Date Taking? Authorizing Provider  predniSONE  (DELTASONE ) 10 MG tablet Take 4 tablets (40 mg total) by mouth daily. 07/15/23  Yes Baelyn Doring, MD  ALPRAZolam  (XANAX ) 1 MG tablet Take 0.5 mg in morning and 1 mg at night prn 04/02/23   Plotnikov, Aleksei V, MD  BIOTIN FORTE PO Take 500 mg by mouth daily. Patient not taking: Reported on 04/02/2023    [provider]  bisacodyl  (DULCOLAX) 5 MG EC tablet Take 1 tablet (5 mg total) by mouth daily as needed for moderate constipation. 10/03/16   Plotnikov, Oakley Bellman, MD  Continuous Blood Gluc Sensor (DEXCOM G6 SENSOR) MISC Inject 1 sensor to the skin every 10 days for continuous glucose monitoring. 01/04/22   [provider]  Continuous Blood Gluc Transmit (DEXCOM G6 TRANSMITTER) MISC Use as directed for continuous glucose monitoring. Reuse transmitter for 90 days then discard and replace. 01/04/22   [provider]  doxycycline  (VIBRA -TABS)  100 MG tablet Take 1 tablet (100 mg total) by mouth 2 (two) times daily. 04/02/23   Plotnikov, Oakley Bellman, MD  DULoxetine  (CYMBALTA ) 20 MG capsule Take 1 capsule (20 mg total) by mouth 2 (two) times daily. 09/27/22   Plotnikov, Aleksei V, MD  glimepiride (AMARYL) 2 MG tablet Take 2 mg by mouth every morning. 06/13/21   [provider]  ibuprofen  (ADVIL ,MOTRIN ) 200 MG tablet Take 400 mg by mouth every 6 (six) hours as needed for headache.    [provider]  insulin  detemir (LEVEMIR ) 100 UNIT/ML FlexPen Inject 16 Units into the skin 2 (two) times daily.    [provider]  Insulin  Disposable Pump (OMNIPOD DASH PODS, GEN 4,) MISC SMARTSIG:SUB-Q Every 3 Days 01/14/22   [provider]  Insulin  Pen Needle 31G X 5 MM MISC Use with Levemir  pen 08/22/14   Mikhail, [name], DO  LANTUS  SOLOSTAR 100 UNIT/ML Solostar Pen Inject 22 Units into the skin daily. 01/15/23   [provider]  lisinopril (ZESTRIL) 10 MG tablet Take 1 tablet by mouth daily. 02/15/21   [provider]  lovastatin (MEVACOR) 20 MG tablet TAKE 1 TABLET BY MOUTH EVERY DAY 04/16/23   Plotnikov, Aleksei V, MD  nitrofurantoin , macrocrystal-monohydrate, (MACROBID ) 100 MG capsule Take 1 capsule (100 mg total) by mouth 2 (two) times daily. 01/27/22   Plotnikov, Oakley Bellman, MD  NOVOLOG  FLEXPEN 100 UNIT/ML FlexPen TAKE AS DIRECTED PER SLIDING SCALE. MAX DAILY DOSE:  50 UNITS/DAY. 05/10/17   [provider]  [facility] VERIO test strip 1 each by Other route 2 (two) times daily.  08/14/14   [provider]  pantoprazole  (PROTONIX ) 40 MG tablet TAKE 1 TABLET BY MOUTH EVERY DAY 04/16/23   Plotnikov, Aleksei V, MD  Polyethyl Glycol-Propyl Glycol (SYSTANE FREE OP) Apply to eye.    [provider]  scopolamine  (TRANSDERM-SCOP) 1 MG/3DAYS Place 1 patch (1.5 mg total) onto the skin every 3 (three) days. 12/28/22   Plotnikov, Oakley Bellman, MD  Pitavastatin  Calcium  1 MG TABS Take 1 tablet (1 mg total)  by mouth daily. 03/24/19 03/26/19  Plotnikov, Aleksei V, MD      Allergies    Metformin , Ciprofloxacin, Crestor [rosuvastatin calcium ], Hydrochlorothiazide, Invokana  [canagliflozin ], Paroxetine, Penicillins, and Simvastatin    Review of Systems   Review of Systems  Constitutional:  Negative for chills and fever.  HENT:  Negative for ear pain and sore throat.   Eyes:  Negative for pain and visual disturbance.  Respiratory:  Negative for cough and shortness of breath.   Cardiovascular:  Negative for chest pain and palpitations.  Gastrointestinal:  Negative for abdominal pain and vomiting.  Genitourinary:  Negative for dysuria and hematuria.  Musculoskeletal:  Negative for arthralgias and back pain.  Skin:  Positive for rash. Negative for color change.  Neurological:  Negative for seizures and syncope.  All other systems reviewed and are negative.   Physical Exam Updated Vital Signs BP 112/77   Pulse 87   Temp (!) 97.3 F (36.3 C)   Resp 16   Ht 1.753 m (5\' 9" )   Wt 87.1 kg   SpO2 94%   BMI 28.35 kg/m  Physical Exam Vitals and nursing note reviewed.  Constitutional:      General: She is not in acute distress.    Appearance: Normal appearance. She is well-developed.  HENT:     Head: Normocephalic and atraumatic.  Eyes:     Extraocular Movements: Extraocular movements intact.     Conjunctiva/sclera: Conjunctivae normal.     Pupils: Pupils are equal, round, and reactive to light.  Cardiovascular:     Rate and Rhythm: Normal rate and regular rhythm.     Heart sounds: No murmur heard. Pulmonary:     Effort: Pulmonary effort is normal. No respiratory distress.     Breath sounds: Normal breath sounds.  Abdominal:     Palpations: Abdomen is soft.     Tenderness: There is no abdominal tenderness.  Musculoskeletal:        General: No swelling.     Cervical back: Normal range of motion and neck supple.  Skin:    General: Skin is warm and dry.     Capillary Refill: Capillary  refill takes less than 2 seconds.     Findings: Erythema and rash present.     Comments: Papular rash with erythematous base large area underneath the chin probably measuring about 4 x 10 cm.  Few specks on the lower extremities as well as a little bit of vesicular component to it.  Small area below the right ear.  Neurological:     General: No focal deficit present.     Mental Status: She is alert and oriented to person, place, and time.  Psychiatric:        Mood and Affect: Mood normal.     ED Results / Procedures / Treatments   Labs (all labs ordered are listed, but only abnormal results are displayed) Labs  Reviewed - No data to display  EKG None  Radiology No results found.  Procedures Procedures    Medications Ordered in ED Medications  predniSONE  (DELTASONE ) tablet 60 mg (has no administration in time range)    ED Course/ Medical Decision Making/ A&P                                 Medical Decision Making  Patient's rash is consistent with a contact dermatitis from like poison ivy.  She does have pets at home.  They do go out and run in the woods is probably where she got it from.  And the pet does sleep with her as well.  Patient nontoxic no acute distress.   Final Clinical Impression(s) / ED Diagnoses Final diagnoses:  Irritant contact dermatitis due to plants, except food    Rx / DC Orders ED Discharge Orders          Ordered    predniSONE  (DELTASONE ) 10 MG tablet  Daily        07/15/23 1248              Jaeveon Ashland, MD 07/15/23 1249

## 2023-07-25 ENCOUNTER — Ambulatory Visit (INDEPENDENT_AMBULATORY_CARE_PROVIDER_SITE_OTHER)

## 2023-07-25 VITALS — Ht 70.0 in | Wt 192.0 lb

## 2023-07-25 DIAGNOSIS — Z794 Long term (current) use of insulin: Secondary | ICD-10-CM

## 2023-07-25 DIAGNOSIS — Z0000 Encounter for general adult medical examination without abnormal findings: Secondary | ICD-10-CM

## 2023-07-25 DIAGNOSIS — E119 Type 2 diabetes mellitus without complications: Secondary | ICD-10-CM

## 2023-07-25 DIAGNOSIS — Z780 Asymptomatic menopausal state: Secondary | ICD-10-CM | POA: Diagnosis not present

## 2023-07-25 NOTE — Patient Instructions (Signed)
 Ms. Catherine Mora , Thank you for taking time out of your busy schedule to complete your Annual Wellness Visit with me. I enjoyed our conversation and look forward to speaking with you again next year. I, as well as your care team,  appreciate your ongoing commitment to your health goals. Please review the following plan we discussed and let me know if I can assist you in the future. Your Game plan/ To Do List    Referrals: If you haven't heard from the office you've been referred to, please reach out to them at the phone provided.  [facility] for a DEXA scan and labwork - Diabetic Kidney urine ACR and eGFR tests Follow up Visits: Next Medicare AWV with our clinical staff: 07/28/2024   Have you seen your provider in the last 6 months (3 months if uncontrolled diabetes)? Yes Next Office Visit with your provider: 07/29/2023  Clinician Recommendations:  Aim for 30 minutes of exercise or brisk walking, 6-8 glasses of water, and 5 servings of fruits and vegetables each day. Educated and advised on getting the Tdap (Tetenus) vaccine in 2025 at local pharmacy.      This is a list of the screening recommended for you and due dates:  Health Maintenance  Topic Date Due   Complete foot exam   Never done   DEXA scan (bone density measurement)  Never done   Hemoglobin A1C  09/25/2021   Yearly kidney health urinalysis for diabetes  03/28/2022   DTaP/Tdap/Td vaccine (2 - Td or Tdap) 10/11/2022   Yearly kidney function blood test for diabetes  01/27/2023   Flu Shot  09/21/2023   Eye exam for diabetics  02/05/2024   Medicare Annual Wellness Visit  07/24/2024   Mammogram  03/06/2025   Colon Cancer Screening  03/26/2026   Pneumonia Vaccine  Completed   Hepatitis C Screening  Completed   HPV Vaccine  Aged Out   Meningitis B Vaccine  Aged Out   COVID-19 Vaccine  Discontinued   Zoster (Shingles) Vaccine  Discontinued    Advanced directives: (Copy Requested) Please bring a copy of your health  care power of attorney and living will to the office to be added to your chart at your convenience. You can mail to [facility] [address] [place], [place] [address] or email to [email or web address] Advance Care Planning is important because it:  [x]  Makes sure you receive the medical care that is consistent with your values, goals, and preferences  [x]  It provides guidance to your family and loved ones and reduces their decisional burden about whether or not they are making the right decisions based on your wishes.  Follow the link provided in your after visit summary or read over the paperwork we have mailed to you to help you started getting your Advance Directives in place. If you need assistance in completing these, please reach out to us  so that we can help you!

## 2023-07-25 NOTE — Progress Notes (Addendum)
 Subjective:   Catherine Mora is a 73 y.o. who presents for a Medicare Wellness preventive visit.  As a reminder, Annual Wellness Visits don't include a physical exam, and some assessments may be limited, especially if this visit is performed virtually. We may recommend an in-person follow-up visit with your provider if needed.  Visit Complete: Virtual I connected with  Catherine Mora on 07/25/23 by a audio enabled telemedicine application and verified that I am speaking with the correct person using two identifiers.  Patient Location: Home  Provider Location: Office/Clinic  I discussed the limitations of evaluation and management by telemedicine. The patient expressed understanding and agreed to proceed.  Vital Signs: Because this visit was a virtual/telehealth visit, some criteria may be missing or patient reported. Any vitals not documented were not able to be obtained and vitals that have been documented are patient reported.  VideoDeclined- This patient declined Librarian, academic. Therefore the visit was completed with audio only.  Persons Participating in Visit: Patient.  AWV Questionnaire: No: Patient Medicare AWV questionnaire was not completed prior to this visit.  Cardiac Risk Factors include: advanced age (>47men, >35 women);diabetes mellitus;dyslipidemia;hypertension     Objective:     Today's Vitals   07/25/23 1433  Weight: 192 lb (87.1 kg)  Height: 5\' 10"  (1.778 m)   Body mass index is 27.55 kg/m.     07/25/2023    2:32 PM 07/15/2023   12:08 PM 09/27/2022   10:40 AM 12/10/2021   10:51 AM 09/19/2021    3:14 PM 06/27/2021   10:59 PM 04/21/2020    1:49 PM  Advanced Directives  Does Patient Have a Medical Advance Directive? Yes No No No No No No  Type of Estate agent of [place];Living will        Copy of Healthcare Power of Attorney in Chart? No - copy requested        Would patient like information on creating  a medical advance directive?   No - Patient declined Yes (ED - Information included in AVS) No - Patient declined  No - Patient declined    Current Medications (verified) Outpatient Encounter Medications as of 07/25/2023  Medication Sig   ALPRAZolam  (XANAX ) 1 MG tablet Take 0.5 mg in morning and 1 mg at night prn   BIOTIN FORTE PO Take 500 mg by mouth daily.   bisacodyl  (DULCOLAX) 5 MG EC tablet Take 1 tablet (5 mg total) by mouth daily as needed for moderate constipation.   Continuous Blood Gluc Sensor (DEXCOM G6 SENSOR) MISC Inject 1 sensor to the skin every 10 days for continuous glucose monitoring.   Continuous Blood Gluc Transmit (DEXCOM G6 TRANSMITTER) MISC Use as directed for continuous glucose monitoring. Reuse transmitter for 90 days then discard and replace.   doxycycline  (VIBRA -TABS) 100 MG tablet Take 1 tablet (100 mg total) by mouth 2 (two) times daily.   DULoxetine  (CYMBALTA ) 20 MG capsule Take 1 capsule (20 mg total) by mouth 2 (two) times daily.   ibuprofen  (ADVIL ,MOTRIN ) 200 MG tablet Take 400 mg by mouth every 6 (six) hours as needed for headache.   insulin  detemir (LEVEMIR ) 100 UNIT/ML FlexPen Inject 16 Units into the skin 2 (two) times daily.   Insulin  Disposable Pump (OMNIPOD DASH PODS, GEN 4,) MISC SMARTSIG:SUB-Q Every 3 Days   Insulin  Pen Needle 31G X 5 MM MISC Use with Levemir  pen   LANTUS  SOLOSTAR 100 UNIT/ML Solostar Pen Inject 22 Units into the skin  daily.   lisinopril (ZESTRIL) 10 MG tablet Take 1 tablet by mouth daily.   lovastatin (MEVACOR) 20 MG tablet TAKE 1 TABLET BY MOUTH EVERY DAY   NOVOLOG  FLEXPEN 100 UNIT/ML FlexPen TAKE AS DIRECTED PER SLIDING SCALE. MAX DAILY DOSE: 50 UNITS/DAY.   ONETOUCH VERIO test strip 1 each by Other route 2 (two) times daily.    Polyethyl Glycol-Propyl Glycol (SYSTANE FREE OP) Apply to eye.   predniSONE  (DELTASONE ) 10 MG tablet Take 4 tablets (40 mg total) by mouth daily.   scopolamine  (TRANSDERM-SCOP) 1 MG/3DAYS Place 1 patch (1.5  mg total) onto the skin every 3 (three) days.   [DISCONTINUED] nitrofurantoin , macrocrystal-monohydrate, (MACROBID ) 100 MG capsule Take 1 capsule (100 mg total) by mouth 2 (two) times daily.   glimepiride (AMARYL) 2 MG tablet Take 2 mg by mouth every morning. (Patient not taking: Reported on 07/25/2023)   pantoprazole  (PROTONIX ) 40 MG tablet TAKE 1 TABLET BY MOUTH EVERY DAY (Patient not taking: Reported on 07/25/2023)   [DISCONTINUED] Pitavastatin  Calcium  1 MG TABS Take 1 tablet (1 mg total) by mouth daily.   No facility-administered encounter medications on file as of 07/25/2023.    Allergies (verified) Metformin , Ciprofloxacin, Crestor [rosuvastatin calcium ], Hydrochlorothiazide, Invokana  [canagliflozin ], Paroxetine, Penicillins, and Simvastatin   History: Past Medical History:  Diagnosis Date   Allergy    Anxiety    Cataract    starting, no surgery yet   Cholelithiasis 2011   Depression    Diabetes mellitus without complication (HCC)    DKA (diabetic ketoacidoses)    several years ago and in hospital x 1 week    GERD (gastroesophageal reflux disease)    Glucose intolerance (impaired glucose tolerance)    HTN (hypertension)    Hyperlipidemia    IBS (irritable bowel syndrome)    PONV (postoperative nausea and vomiting)    Past Surgical History:  Procedure Laterality Date   ABDOMINAL HYSTERECTOMY     CHOLECYSTECTOMY N/A 07/03/2012   Procedure: LAPAROSCOPIC CHOLECYSTECTOMY WITH INTRAOPERATIVE CHOLANGIOGRAM;  Surgeon: Cloyce Darby, MD;  Location: [facility] OR;  Service: General;  Laterality: N/A;   COLONOSCOPY     cyst removed     x3   OOPHORECTOMY     POLYPECTOMY     Family History  Problem Relation Age of Onset   Diabetes Mother    Diabetes Sister    Diabetes Brother    Diabetes Other    Stomach cancer Paternal Uncle    Heart disease Neg Hx    Colon cancer Neg Hx    Esophageal cancer Neg Hx    Rectal cancer Neg Hx    Colon polyps Neg Hx    Social History   Socioeconomic  History   Marital status: Widowed    Spouse name: Not on file   Number of children: 1   Years of education: Not on file   Highest education level: Not on file  Occupational History   Occupation: [employer]    Comment: Wks w/husband  Tobacco Use   Smoking status: Never   Smokeless tobacco: Never  Vaping Use   Vaping status: Never Used  Substance and Sexual Activity   Alcohol use: Not Currently   Drug use: No   Sexual activity: Yes  Other Topics Concern   Not on file  Social History Narrative   Not on file   Social Drivers of Health   Financial Resource Strain: Low Risk  (07/25/2023)   Overall Financial Resource Strain (CARDIA)    Difficulty  of Paying Living Expenses: Not hard at all  Food Insecurity: No Food Insecurity (07/25/2023)   Hunger Vital Sign    Worried About Running Out of Food in the Last Year: Never true    Ran Out of Food in the Last Year: Never true  Transportation Needs: No Transportation Needs (07/25/2023)   PRAPARE - Administrator, Civil Service (Medical): No    Lack of Transportation (Non-Medical): No  Physical Activity: Insufficiently Active (07/25/2023)   Exercise Vital Sign    Days of Exercise per Week: 7 days    Minutes of Exercise per Session: 10 min  Stress: No Stress Concern Present (07/25/2023)   [employer] of Occupational Health - Occupational Stress Questionnaire    Feeling of Stress : Not at all  Social Connections: Moderately Integrated (07/25/2023)   Social Connection and Isolation Panel [NHANES]    Frequency of Communication with Friends and Family: Three times a week    Frequency of Social Gatherings with Friends and Family: Three times a week    Attends Religious Services: 1 to 4 times per year    Active Member of Clubs or Organizations: No    Attends Banker Meetings: 1 to 4 times per year    Marital Status: Widowed    Tobacco Counseling Counseling given: No    Clinical Intake:  Pre-visit  preparation completed: Yes  Pain : No/denies pain     BMI - recorded: 27.55 Nutritional Status: BMI 25 -29 Overweight Nutritional Risks: None Diabetes: Yes CBG done?: Yes CBG resulted in Enter/ Edit results?: Yes (fasting - 130) Did pt. bring in CBG monitor from home?: No  Lab Results  Component Value Date   HGBA1C 6.9 (H) 03/28/2021   HGBA1C 7.7 (H) 10/03/2016   HGBA1C 7.8 (H) 09/28/2015     How often do you need to have someone help you when you read instructions, pamphlets, or other written materials from your doctor or pharmacy?: 1 - Never  Interpreter Needed?: No  Information entered by :: Kandy Orris, CMA   Activities of Daily Living     07/25/2023    2:36 PM 09/27/2022   10:43 AM  In your present state of health, do you have any difficulty performing the following activities:  Hearing? 0 0  Vision? 0 0  Difficulty concentrating or making decisions? 0 0  Walking or climbing stairs? 0 0  Dressing or bathing? 0 0  Doing errands, shopping? 0 0  Preparing Food and eating ? N N  Using the Toilet? N N  In the past six months, have you accidently leaked urine? N N  Do you have problems with loss of bowel control? N N  Managing your Medications? N N  Managing your Finances? N N  Housekeeping or managing your Housekeeping? N N    Patient Care Team: Plotnikov, Oakley Bellman, MD as PCP - General Belle Box, MD as Obstetrician (Obstetrics and Gynecology) Shelah Derry, MD as Referring Physician (Endocrinology) Jonette Nestle, [facility] as Consulting Physician (Obstetrics and Gynecology) [facility] as Consulting Physician (Optometry)  I have updated your Care Teams any recent Medical Services you may have received from other providers in the past year.     Assessment:    This is a routine wellness examination for Catherine Mora.  Hearing/Vision screen Hearing Screening - Comments:: Denies hearing difficulties   Vision Screening - Comments:: Wears eyeglasses  for reading only - up to date with routine eye exams with  [facility]   Goals Addressed               This Visit's Progress     Patient Stated (pt-stated)        Patient stated she's wanting to manage her blood sugar levels       Depression Screen     07/25/2023    2:37 PM 12/28/2022   10:04 AM 09/27/2022   10:15 AM 04/07/2022    9:47 AM 03/29/2022    9:53 AM 12/27/2021    8:19 AM 09/28/2021   11:10 AM  PHQ 2/9 Scores  PHQ - 2 Score 0 1 2 0 0 0 0  PHQ- 9 Score 0 5 7  0 0 0    Fall Risk     07/25/2023    2:36 PM 12/28/2022   10:04 AM 09/27/2022   10:43 AM 04/07/2022    9:47 AM 03/29/2022    9:53 AM  Fall Risk   Falls in the past year? 0 0 0 0 0  Number falls in past yr: 0 0 0 0 0  Injury with Fall? 0 0 0 0 0  Risk for fall due to : No Fall Risks No Fall Risks No Fall Risks No Fall Risks No Fall Risks  Follow up Falls evaluation completed;Falls prevention discussed Falls evaluation completed Falls prevention discussed Falls evaluation completed Falls evaluation completed    MEDICARE RISK AT HOME:  Medicare Risk at Home Any stairs in or around the home?: Yes If so, are there any without handrails?: No Home free of loose throw rugs in walkways, pet beds, electrical cords, etc?: Yes Adequate lighting in your home to reduce risk of falls?: Yes Life alert?: No Use of a cane, walker or w/c?: No Grab bars in the bathroom?: No Shower chair or bench in shower?: No Elevated toilet seat or a handicapped toilet?: No  TIMED UP AND GO:  Was the test performed?  No  Cognitive Function: 6CIT completed        07/25/2023    2:40 PM 09/27/2022   10:43 AM 09/19/2021    3:21 PM  6CIT Screen  What Year? 0 points 0 points 0 points  What month? 0 points 0 points 0 points  What time? 0 points 0 points 0 points  Count back from 20 0 points 0 points 0 points  Months in reverse 0 points 0 points 0 points  Repeat phrase 0 points 0 points 0 points  Total Score 0 points 0 points 0 points     Immunizations Immunization History  Administered Date(s) Administered   Fluad Quad(high Dose 65+) 03/09/2020, 12/28/2020, 12/27/2021   Influenza, High Dose Seasonal PF 12/24/2017, 10/14/2018   Influenza-Unspecified 01/16/2023   PNEUMOCOCCAL CONJUGATE-20 12/28/2020   Pneumococcal Conjugate-13 09/28/2015   Pneumococcal Polysaccharide-23 10/03/2016   Tdap 10/10/2012   Zoster Recombinant(Shingrix ) 07/23/2022, 11/06/2022    Screening Tests Health Maintenance  Topic Date Due   FOOT EXAM  Never done   DEXA SCAN  Never done   HEMOGLOBIN A1C  09/25/2021   Diabetic kidney evaluation - Urine ACR  03/28/2022   DTaP/Tdap/Td (2 - Td or Tdap) 10/11/2022   Diabetic kidney evaluation - eGFR measurement  01/27/2023   INFLUENZA VACCINE  09/21/2023   OPHTHALMOLOGY EXAM  02/05/2024   Medicare Annual Wellness (AWV)  07/24/2024   MAMMOGRAM  03/06/2025   Colonoscopy  03/26/2026   Pneumonia Vaccine 3+ Years old  Completed   Hepatitis C Screening  Completed   HPV  VACCINES  Aged Out   Meningococcal B Vaccine  Aged Out   COVID-19 Vaccine  Discontinued   Zoster Vaccines- Shingrix   Discontinued    Health Maintenance  Health Maintenance Due  Topic Date Due   FOOT EXAM  Never done   DEXA SCAN  Never done   HEMOGLOBIN A1C  09/25/2021   Diabetic kidney evaluation - Urine ACR  03/28/2022   DTaP/Tdap/Td (2 - Td or Tdap) 10/11/2022   Diabetic kidney evaluation - eGFR measurement  01/27/2023   Health Maintenance Items Addressed: DEXA ordered, Labs, Diabetic Kidney evaluation - eGFR and Urine ACR  Additional Screening:  Vision Screening: Recommended annual ophthalmology exams for early detection of glaucoma and other disorders of the eye. Pt stated will schedule an appt for a diabetic eye exam in 2025 w/[facility].   Dental Screening: Recommended annual dental exams for proper oral hygiene  Community Resource Referral / Chronic Care Management: CRR required this visit?  No   CCM required  this visit?  No   Plan:    I have personally reviewed and noted the following in the patient's chart:   Medical and social history Use of alcohol, tobacco or illicit drugs  Current medications and supplements including opioid prescriptions. Patient is not currently taking opioid prescriptions. Functional ability and status Nutritional status Physical activity Advanced directives List of other physicians Hospitalizations, surgeries, and ER visits in previous 12 months Vitals Screenings to include cognitive, depression, and falls Referrals and appointments  In addition, I have reviewed and discussed with patient certain preventive protocols, quality metrics, and best practice recommendations. A written personalized care plan for preventive services as well as general preventive health recommendations were provided to patient.   Patria Bookbinder, CMA   07/25/2023   After Visit Summary: (MyChart) Due to this being a telephonic visit, the after visit summary with patients personalized plan was offered to patient via MyChart   Notes: Nothing significant to report at this time.  Medical screening examination/treatment/procedure(s) were performed by non-physician practitioner and as supervising physician I was immediately available for consultation/collaboration.  I agree with above. Adelaide Holy, MD

## 2023-07-31 ENCOUNTER — Ambulatory Visit (INDEPENDENT_AMBULATORY_CARE_PROVIDER_SITE_OTHER): Admitting: Internal Medicine

## 2023-07-31 ENCOUNTER — Other Ambulatory Visit

## 2023-07-31 ENCOUNTER — Encounter: Payer: Self-pay | Admitting: Internal Medicine

## 2023-07-31 VITALS — BP 110/70 | HR 78 | Temp 97.9°F | Ht 70.0 in | Wt 190.0 lb

## 2023-07-31 DIAGNOSIS — E785 Hyperlipidemia, unspecified: Secondary | ICD-10-CM

## 2023-07-31 DIAGNOSIS — E119 Type 2 diabetes mellitus without complications: Secondary | ICD-10-CM

## 2023-07-31 DIAGNOSIS — Z794 Long term (current) use of insulin: Secondary | ICD-10-CM | POA: Diagnosis not present

## 2023-07-31 DIAGNOSIS — F4323 Adjustment disorder with mixed anxiety and depressed mood: Secondary | ICD-10-CM | POA: Diagnosis not present

## 2023-07-31 LAB — MICROALBUMIN / CREATININE URINE RATIO
Creatinine,U: 99.4 mg/dL
Microalb Creat Ratio: UNDETERMINED mg/g (ref 0.0–30.0)
Microalb, Ur: <0.7 mg/dL

## 2023-07-31 MED ORDER — TRIAMCINOLONE ACETONIDE 0.5 % EX CREA: 1.0000 | TOPICAL_CREAM | Freq: Three times a day (TID) | CUTANEOUS | 1 refills | Status: AC

## 2023-07-31 NOTE — Assessment & Plan Note (Signed)
On Insulin pump/Dexcom 6

## 2023-07-31 NOTE — Assessment & Plan Note (Signed)
Coronary calcium CT score is 117.  On lovastatin

## 2023-07-31 NOTE — Progress Notes (Signed)
 Subjective:  Patient ID: Catherine Mora, female    DOB: 01/18/1951  Age: 73 y.o. MRN: [id number]  CC: Medical Management of Chronic Issues (3 mnth f/u)   HPI Catherine Mora presents for  DM, HTN, dyslipidemia  Outpatient Medications Prior to Visit  Medication Sig Dispense Refill   ALPRAZolam  (XANAX ) 1 MG tablet Take 0.5 mg in morning and 1 mg at night prn 135 tablet 1   BIOTIN FORTE PO Take 500 mg by mouth daily.     bisacodyl  (DULCOLAX) 5 MG EC tablet Take 1 tablet (5 mg total) by mouth daily as needed for moderate constipation. 30 tablet 3   Continuous Blood Gluc Sensor (DEXCOM G6 SENSOR) MISC Inject 1 sensor to the skin every 10 days for continuous glucose monitoring.     Continuous Blood Gluc Transmit (DEXCOM G6 TRANSMITTER) MISC Use as directed for continuous glucose monitoring. Reuse transmitter for 90 days then discard and replace.     doxycycline  (VIBRA -TABS) 100 MG tablet Take 1 tablet (100 mg total) by mouth 2 (two) times daily. 28 tablet 1   DULoxetine  (CYMBALTA ) 20 MG capsule Take 1 capsule (20 mg total) by mouth 2 (two) times daily. 180 capsule 3   ibuprofen  (ADVIL ,MOTRIN ) 200 MG tablet Take 400 mg by mouth every 6 (six) hours as needed for headache.     insulin  detemir (LEVEMIR ) 100 UNIT/ML FlexPen Inject 16 Units into the skin 2 (two) times daily.     Insulin  Disposable Pump (OMNIPOD DASH PODS, GEN 4,) MISC SMARTSIG:SUB-Q Every 3 Days     Insulin  Pen Needle 31G X 5 MM MISC Use with Levemir  pen 100 each 0   LANTUS  SOLOSTAR 100 UNIT/ML Solostar Pen Inject 22 Units into the skin daily.     lisinopril (ZESTRIL) 10 MG tablet Take 1 tablet by mouth daily.     lovastatin (MEVACOR) 20 MG tablet TAKE 1 TABLET BY MOUTH EVERY DAY 90 tablet 3   NOVOLOG  FLEXPEN 100 UNIT/ML FlexPen TAKE AS DIRECTED PER SLIDING SCALE. MAX DAILY DOSE: 50 UNITS/DAY.  3   ONETOUCH VERIO test strip 1 each by Other route 2 (two) times daily.      OZEMPIC, 0.25 OR 0.5 MG/DOSE, 2 MG/3ML SOPN      Polyethyl  Glycol-Propyl Glycol (SYSTANE FREE OP) Apply to eye.     predniSONE  (DELTASONE ) 10 MG tablet Take 4 tablets (40 mg total) by mouth daily. 20 tablet 0   scopolamine  (TRANSDERM-SCOP) 1 MG/3DAYS Place 1 patch (1.5 mg total) onto the skin every 3 (three) days. 4 patch 0   valACYclovir (VALTREX) 1000 MG tablet Take 1,000 mg by mouth daily.     glimepiride (AMARYL) 2 MG tablet Take 2 mg by mouth every morning. (Patient not taking: Reported on 07/31/2023)     pantoprazole  (PROTONIX ) 40 MG tablet TAKE 1 TABLET BY MOUTH EVERY DAY (Patient not taking: Reported on 07/31/2023) 90 tablet 3   No facility-administered medications prior to visit.    ROS: Review of Systems  Constitutional:  Negative for activity change, appetite change, chills, fatigue and unexpected weight change.  HENT:  Negative for congestion, mouth sores and sinus pressure.   Eyes:  Negative for visual disturbance.  Respiratory:  Negative for cough and chest tightness.   Gastrointestinal:  Negative for abdominal pain and nausea.  Genitourinary:  Negative for difficulty urinating, frequency and vaginal pain.  Musculoskeletal:  Negative for back pain and gait problem.  Skin:  Negative for pallor and rash.  Neurological:  Negative for dizziness, tremors, weakness, numbness and headaches.  Psychiatric/Behavioral:  Negative for confusion and sleep disturbance.     Objective:  BP 110/70   Pulse 78   Temp 97.9 F (36.6 C) (Oral)   Ht 5\' 10"  (1.778 m)   Wt 190 lb (86.2 kg)   SpO2 97%   BMI 27.26 kg/m   BP Readings from Last 3 Encounters:  07/31/23 110/70  07/15/23 112/77  04/02/23 120/70    Wt Readings from Last 3 Encounters:  07/31/23 190 lb (86.2 kg)  07/25/23 192 lb (87.1 kg)  07/15/23 192 lb (87.1 kg)    Physical Exam Constitutional:      General: She is not in acute distress.    Appearance: She is well-developed.  HENT:     Head: Normocephalic.     Right Ear: External ear normal.     Left Ear: External ear  normal.     Nose: Nose normal.  Eyes:     General:        Right eye: No discharge.        Left eye: No discharge.     Conjunctiva/sclera: Conjunctivae normal.     Pupils: Pupils are equal, round, and reactive to light.  Neck:     Thyroid : No thyromegaly.     Vascular: No JVD.     Trachea: No tracheal deviation.  Cardiovascular:     Rate and Rhythm: Normal rate and regular rhythm.     Heart sounds: Normal heart sounds.  Pulmonary:     Effort: No respiratory distress.     Breath sounds: No stridor. No wheezing.  Abdominal:     General: Bowel sounds are normal. There is no distension.     Palpations: Abdomen is soft. There is no mass.     Tenderness: There is no abdominal tenderness. There is no guarding or rebound.  Musculoskeletal:        General: No tenderness.     Cervical back: Normal range of motion and neck supple. No rigidity.     Right lower leg: No edema.     Left lower leg: No edema.  Lymphadenopathy:     Cervical: No cervical adenopathy.  Skin:    Findings: No erythema or rash.  Neurological:     Cranial Nerves: No cranial nerve deficit.     Motor: No abnormal muscle tone.     Coordination: Coordination normal.     Deep Tendon Reflexes: Reflexes normal.  Psychiatric:        Behavior: Behavior normal.        Thought Content: Thought content normal.        Judgment: Judgment normal.     Lab Results  Component Value Date   WBC 6.2 01/26/2022   HGB 13.0 01/26/2022   HCT 38.9 01/26/2022   PLT 270.0 01/26/2022   GLUCOSE 153 (H) 01/26/2022   CHOL 221 (H) 01/26/2022   TRIG 159.0 (H) 01/26/2022   HDL 47.60 01/26/2022   LDLDIRECT 219.0 08/14/2014   LDLCALC 142 (H) 01/26/2022   ALT 14 01/26/2022   AST 18 01/26/2022   NA 139 01/26/2022   K 4.4 01/26/2022   CL 102 01/26/2022   CREATININE 1.30 (H) 01/26/2022   BUN 31 (H) 01/26/2022   CO2 26 01/26/2022   TSH 3.40 01/26/2022   HGBA1C 6.9 (H) 03/28/2021   MICROALBUR 1.9 03/28/2021    No results  found.  Assessment & Plan:   Problem List Items Addressed This Visit  Adjustment disorder with mixed anxiety and depressed mood   Grieving, coping a little better: almost 1 year since her husband passed      Type 2 diabetes mellitus without complication (HCC) - Primary    On Insulin  pump/Dexcom 6      Relevant Medications   OZEMPIC, 0.25 OR 0.5 MG/DOSE, 2 MG/3ML SOPN   Dyslipidemia   Coronary calcium  CT score is 117.  On lovastatin      Long-term insulin  use (HCC)   On a pump         Meds ordered this encounter  Medications   triamcinolone cream (KENALOG) 0.5 %    Sig: Apply 1 Application topically 3 (three) times daily.    Dispense:  90 g    Refill:  1      Follow-up: Return in about 4 months (around 11/30/2023) for a follow-up visit.  Anitra Barn, MD

## 2023-07-31 NOTE — Assessment & Plan Note (Addendum)
 Grieving, coping a little better: almost 1 year since her husband passed

## 2023-07-31 NOTE — Assessment & Plan Note (Signed)
On a pump 

## 2023-08-01 ENCOUNTER — Ambulatory Visit: Payer: Self-pay | Admitting: Internal Medicine

## 2023-08-02 LAB — CMP14+EGFR
ALT: 18 IU/L (ref 0–32)
AST: 24 IU/L (ref 0–40)
Albumin: 4.6 g/dL (ref 3.8–4.8)
Alkaline Phosphatase: 98 IU/L (ref 44–121)
BUN/Creatinine Ratio: 22 (ref 12–28)
BUN: 31 mg/dL — ABNORMAL HIGH (ref 8–27)
Bilirubin Total: <0.2 mg/dL (ref 0.0–1.2)
Calcium: 10 mg/dL (ref 8.7–10.3)
Chloride: 114 mmol/L — ABNORMAL HIGH (ref 96–106)
Creatinine, Ser: 1.43 mg/dL — ABNORMAL HIGH (ref 0.57–1.00)
Globulin, Total: 3.6 g/dL (ref 1.5–4.5)
Glucose: 158 mg/dL — ABNORMAL HIGH (ref 70–99)
Potassium: 5.6 mmol/L — ABNORMAL HIGH (ref 3.5–5.2)
Total Protein: 8.2 g/dL (ref 6.0–8.5)
eGFR: 39 mL/min/{1.73_m2} — ABNORMAL LOW (ref >59)

## 2023-08-05 DIAGNOSIS — E119 Type 2 diabetes mellitus without complications: Secondary | ICD-10-CM | POA: Diagnosis not present

## 2023-08-06 ENCOUNTER — Ambulatory Visit: Payer: Self-pay

## 2023-08-06 DIAGNOSIS — Z794 Long term (current) use of insulin: Secondary | ICD-10-CM

## 2023-08-06 NOTE — Addendum Note (Signed)
 Addended by: Darryll Raju V on: 08/06/2023 10:52 PM   Modules accepted: Orders

## 2023-08-06 NOTE — Telephone Encounter (Signed)
 FYI Only or Action Required?: Action required by provider  Patient was last seen in primary care on 07/31/2023 by Plotnikov, Oakley Bellman, MD. Called Nurse Triage reporting Lab Results.   Triage Disposition: Call PCP Within 24 Hours  Patient/caregiver understands and will follow disposition?: No, wishes to speak with PCP       Copied from CRM [id number]. Topic: Clinical - Lab/Test Results >> Aug 06, 2023 10:09 AM Catherine Mora wrote: Reason for CRM: Patient wants to discuss lab results. Reason for Disposition  Caller requesting lab results  (Exception: Routine or non-urgent lab result.)  Answer Assessment - Initial Assessment Questions 1. REASON FOR CALL or QUESTION: What is your reason for calling today? or How can I best help you? or What question do you have that I can help answer?     Patient is questioning if she should do anything for her high potassium level of 5.6. Patient asking for advice from Catherine Mora. Please advise. 2. CALLER: Document the source of call. (e.g., laboratory, patient).     Patient  Protocols used: PCP Call - No Triage-A-AH

## 2023-08-06 NOTE — Telephone Encounter (Signed)
 Suleima needs to hydrate herself better.  Recheck blood work in 1-2 weeks.  Thanks

## 2023-08-08 NOTE — Telephone Encounter (Signed)
 I was able to inform the pt of providers advice Shar needs to hydrate herself better. Recheck blood work in 1-2 weeks. Thanks

## 2023-08-15 DIAGNOSIS — Z794 Long term (current) use of insulin: Secondary | ICD-10-CM | POA: Diagnosis not present

## 2023-08-15 DIAGNOSIS — Z9641 Presence of insulin pump (external) (internal): Secondary | ICD-10-CM | POA: Diagnosis not present

## 2023-08-15 DIAGNOSIS — E119 Type 2 diabetes mellitus without complications: Secondary | ICD-10-CM | POA: Diagnosis not present

## 2023-09-04 DIAGNOSIS — E119 Type 2 diabetes mellitus without complications: Secondary | ICD-10-CM | POA: Diagnosis not present

## 2023-09-21 ENCOUNTER — Other Ambulatory Visit: Payer: Self-pay | Admitting: Internal Medicine

## 2023-09-24 ENCOUNTER — Ambulatory Visit (INDEPENDENT_AMBULATORY_CARE_PROVIDER_SITE_OTHER)
Admission: RE | Admit: 2023-09-24 | Discharge: 2023-09-24 | Disposition: A | Discharge status: Home / Self Care | Source: Ambulatory Visit | Attending: Internal Medicine | Admitting: Internal Medicine

## 2023-09-24 ENCOUNTER — Other Ambulatory Visit (INDEPENDENT_AMBULATORY_CARE_PROVIDER_SITE_OTHER)

## 2023-09-24 DIAGNOSIS — Z780 Asymptomatic menopausal state: Secondary | ICD-10-CM | POA: Diagnosis not present

## 2023-09-24 DIAGNOSIS — Z794 Long term (current) use of insulin: Secondary | ICD-10-CM

## 2023-09-24 DIAGNOSIS — E119 Type 2 diabetes mellitus without complications: Secondary | ICD-10-CM

## 2023-09-24 LAB — COMPREHENSIVE METABOLIC PANEL WITH GFR
ALT: 14 U/L (ref 0–35)
AST: 18 U/L (ref 0–37)
Albumin: 4.1 g/dL (ref 3.5–5.2)
Alkaline Phosphatase: 75 U/L (ref 39–117)
BUN: 23 mg/dL (ref 6–23)
CO2: 29 meq/L (ref 19–32)
Calcium: 9.5 mg/dL (ref 8.4–10.5)
Chloride: 102 meq/L (ref 96–112)
Creatinine, Ser: 1.24 mg/dL — ABNORMAL HIGH (ref 0.40–1.20)
GFR: 43.19 mL/min — ABNORMAL LOW (ref >60.00)
Glucose, Bld: 103 mg/dL — ABNORMAL HIGH (ref 70–99)
Potassium: 4.2 meq/L (ref 3.5–5.1)
Sodium: 138 meq/L (ref 135–145)
Total Bilirubin: 0.3 mg/dL (ref 0.2–1.2)
Total Protein: 7.5 g/dL (ref 6.0–8.3)

## 2023-09-25 ENCOUNTER — Ambulatory Visit: Payer: Self-pay | Admitting: Internal Medicine

## 2023-09-25 DIAGNOSIS — E119 Type 2 diabetes mellitus without complications: Secondary | ICD-10-CM

## 2023-10-05 DIAGNOSIS — E119 Type 2 diabetes mellitus without complications: Secondary | ICD-10-CM | POA: Diagnosis not present

## 2023-11-05 DIAGNOSIS — E119 Type 2 diabetes mellitus without complications: Secondary | ICD-10-CM | POA: Diagnosis not present

## 2023-11-15 DIAGNOSIS — Z794 Long term (current) use of insulin: Secondary | ICD-10-CM | POA: Diagnosis not present

## 2023-11-15 DIAGNOSIS — E119 Type 2 diabetes mellitus without complications: Secondary | ICD-10-CM | POA: Diagnosis not present

## 2023-11-15 DIAGNOSIS — Z9641 Presence of insulin pump (external) (internal): Secondary | ICD-10-CM | POA: Diagnosis not present

## 2023-11-29 ENCOUNTER — Other Ambulatory Visit (INDEPENDENT_AMBULATORY_CARE_PROVIDER_SITE_OTHER)

## 2023-11-29 ENCOUNTER — Emergency Department: Payer: Self-pay

## 2023-11-29 DIAGNOSIS — E119 Type 2 diabetes mellitus without complications: Secondary | ICD-10-CM

## 2023-11-29 DIAGNOSIS — Z794 Long term (current) use of insulin: Secondary | ICD-10-CM

## 2023-11-29 LAB — COMPREHENSIVE METABOLIC PANEL WITH GFR
ALT: 17 U/L (ref 0–35)
AST: 21 U/L (ref 0–37)
Albumin: 4 g/dL (ref 3.5–5.2)
Alkaline Phosphatase: 66 U/L (ref 39–117)
BUN: 31 mg/dL — ABNORMAL HIGH (ref 6–23)
CO2: 27 meq/L (ref 19–32)
Calcium: 8.9 mg/dL (ref 8.4–10.5)
Chloride: 105 meq/L (ref 96–112)
Creatinine, Ser: 1.23 mg/dL — ABNORMAL HIGH (ref 0.40–1.20)
GFR: 43.56 mL/min — ABNORMAL LOW (ref >60.00)
Glucose, Bld: 151 mg/dL — ABNORMAL HIGH (ref 70–99)
Potassium: 4.2 meq/L (ref 3.5–5.1)
Sodium: 139 meq/L (ref 135–145)
Total Bilirubin: 0.4 mg/dL (ref 0.2–1.2)
Total Protein: 7.1 g/dL (ref 6.0–8.3)

## 2023-11-29 LAB — HEMOGLOBIN A1C: Hgb A1c MFr Bld: 7.3 % — ABNORMAL HIGH (ref 4.6–6.5)

## 2023-12-03 ENCOUNTER — Telehealth: Payer: Self-pay

## 2023-12-03 ENCOUNTER — Ambulatory Visit: Payer: Self-pay | Admitting: Internal Medicine

## 2023-12-03 ENCOUNTER — Encounter: Payer: Self-pay | Admitting: Internal Medicine

## 2023-12-03 ENCOUNTER — Other Ambulatory Visit (HOSPITAL_COMMUNITY): Payer: Self-pay

## 2023-12-03 ENCOUNTER — Ambulatory Visit: Admitting: Internal Medicine

## 2023-12-03 VITALS — BP 118/76 | HR 68 | Temp 98.1°F | Ht 70.0 in | Wt 199.0 lb

## 2023-12-03 DIAGNOSIS — F4323 Adjustment disorder with mixed anxiety and depressed mood: Secondary | ICD-10-CM | POA: Diagnosis not present

## 2023-12-03 DIAGNOSIS — E663 Overweight: Secondary | ICD-10-CM

## 2023-12-03 DIAGNOSIS — N1832 Chronic kidney disease, stage 3b: Secondary | ICD-10-CM

## 2023-12-03 DIAGNOSIS — G4700 Insomnia, unspecified: Secondary | ICD-10-CM | POA: Diagnosis not present

## 2023-12-03 DIAGNOSIS — M810 Age-related osteoporosis without current pathological fracture: Secondary | ICD-10-CM | POA: Insufficient documentation

## 2023-12-03 DIAGNOSIS — E1122 Type 2 diabetes mellitus with diabetic chronic kidney disease: Secondary | ICD-10-CM | POA: Diagnosis not present

## 2023-12-03 DIAGNOSIS — E785 Hyperlipidemia, unspecified: Secondary | ICD-10-CM

## 2023-12-03 DIAGNOSIS — Z23 Encounter for immunization: Secondary | ICD-10-CM | POA: Diagnosis not present

## 2023-12-03 DIAGNOSIS — Z794 Long term (current) use of insulin: Secondary | ICD-10-CM | POA: Diagnosis not present

## 2023-12-03 DIAGNOSIS — I2583 Coronary atherosclerosis due to lipid rich plaque: Secondary | ICD-10-CM | POA: Diagnosis not present

## 2023-12-03 MED ORDER — ALPRAZOLAM 1 MG PO TABS: ORAL_TABLET | ORAL | 1 refills | Status: AC

## 2023-12-03 MED ORDER — TIRZEPATIDE 2.5 MG/0.5ML ~~LOC~~ SOAJ: 2.5000 mg | SUBCUTANEOUS | 5 refills | Status: DC

## 2023-12-03 NOTE — Telephone Encounter (Signed)
 Pharmacy Patient Advocate Encounter   Received notification from CoverMyMeds that prior authorization for Mounjaro 2.5MG /0.5ML auto-injectors  is required/requested.   Insurance verification completed.   The patient is insured through [facility] [employer] ADVANTAGE/RX ADVANCE.   Per test claim: PA required; PA submitted to above mentioned insurance via Latent Key/confirmation #/EOC [id number] Status is pending

## 2023-12-03 NOTE — Assessment & Plan Note (Signed)
Xanax at hs  Potential benefits of a long term benzodiazepines  use as well as potential risks  and complications were explained to the patient and were aknowledged. 

## 2023-12-03 NOTE — Assessment & Plan Note (Addendum)
 On Insulin  pump/Dexcom 6 F/u Dr Tobie Crafts gave side effects after 5 mo - d/c Will add Mounjaro if OK w/Dr Tobie

## 2023-12-03 NOTE — Assessment & Plan Note (Signed)
 Discussed.

## 2023-12-03 NOTE — Assessment & Plan Note (Signed)
 Continue with pravastatin

## 2023-12-03 NOTE — Assessment & Plan Note (Signed)
 Options discussed Prolia option was discussed Ca, Vit D, exercise

## 2023-12-03 NOTE — Progress Notes (Signed)
 Subjective:  Patient ID: Catherine Mora, female    DOB: [birth year]  Age: 73 y.o. MRN: [id number]  CC: Medical Management of Chronic Issues (4 Month follow up)   HPI Catherine Mora presents for DM, HTN, CRI Ozempic gave side effects  Outpatient Medications Prior to Visit  Medication Sig Dispense Refill   BIOTIN FORTE PO Take 500 mg by mouth daily.     bisacodyl  (DULCOLAX) 5 MG EC tablet Take 1 tablet (5 mg total) by mouth daily as needed for moderate constipation. 30 tablet 3   Continuous Blood Gluc Sensor (DEXCOM G6 SENSOR) MISC Inject 1 sensor to the skin every 10 days for continuous glucose monitoring.     Continuous Blood Gluc Transmit (DEXCOM G6 TRANSMITTER) MISC Use as directed for continuous glucose monitoring. Reuse transmitter for 90 days then discard and replace.     doxycycline  (VIBRA -TABS) 100 MG tablet Take 1 tablet (100 mg total) by mouth 2 (two) times daily. 28 tablet 1   DULoxetine  (CYMBALTA ) 20 MG capsule TAKE 1 CAPSULE BY MOUTH TWICE A DAY 180 capsule 3   ibuprofen  (ADVIL ,MOTRIN ) 200 MG tablet Take 400 mg by mouth every 6 (six) hours as needed for headache.     insulin  detemir (LEVEMIR ) 100 UNIT/ML FlexPen Inject 16 Units into the skin 2 (two) times daily.     Insulin  Disposable Pump (OMNIPOD DASH PODS, GEN 4,) MISC SMARTSIG:SUB-Q Every 3 Days     Insulin  Pen Needle 31G X 5 MM MISC Use with Levemir  pen 100 each 0   LANTUS  SOLOSTAR 100 UNIT/ML Solostar Pen Inject 22 Units into the skin daily.     lisinopril (ZESTRIL) 10 MG tablet Take 1 tablet by mouth daily.     lovastatin (MEVACOR) 20 MG tablet TAKE 1 TABLET BY MOUTH EVERY DAY 90 tablet 3   NOVOLOG  FLEXPEN 100 UNIT/ML FlexPen TAKE AS DIRECTED PER SLIDING SCALE. MAX DAILY DOSE: 50 UNITS/DAY.  3   ONETOUCH VERIO test strip 1 each by Other route 2 (two) times daily.      pantoprazole  (PROTONIX ) 40 MG tablet TAKE 1 TABLET BY MOUTH EVERY DAY 90 tablet 3   Polyethyl Glycol-Propyl Glycol (SYSTANE FREE OP) Apply to eye.      predniSONE  (DELTASONE ) 10 MG tablet Take 4 tablets (40 mg total) by mouth daily. 20 tablet 0   scopolamine  (TRANSDERM-SCOP) 1 MG/3DAYS Place 1 patch (1.5 mg total) onto the skin every 3 (three) days. 4 patch 0   triamcinolone  cream (KENALOG ) 0.5 % Apply 1 Application topically 3 (three) times daily. 90 g 1   valACYclovir (VALTREX) 1000 MG tablet Take 1,000 mg by mouth daily.     ALPRAZolam  (XANAX ) 1 MG tablet Take 0.5 mg in morning and 1 mg at night prn 135 tablet 1   OZEMPIC, 0.25 OR 0.5 MG/DOSE, 2 MG/3ML SOPN      glimepiride (AMARYL) 2 MG tablet Take 2 mg by mouth every morning. (Patient not taking: Reported on 12/03/2023)     No facility-administered medications prior to visit.    ROS: Review of Systems  Constitutional:  Positive for fatigue. Negative for activity change, appetite change, chills and unexpected weight change.  HENT:  Negative for congestion, mouth sores and sinus pressure.   Eyes:  Negative for visual disturbance.  Respiratory:  Negative for cough and chest tightness.   Gastrointestinal:  Negative for abdominal pain and nausea.  Genitourinary:  Negative for difficulty urinating, frequency and vaginal pain.  Musculoskeletal:  Negative for back pain  and gait problem.  Skin:  Negative for pallor and rash.  Neurological:  Negative for dizziness, tremors, weakness, numbness and headaches.  Psychiatric/Behavioral:  Negative for confusion, sleep disturbance and suicidal ideas.     Objective:  BP 118/76   Pulse 68   Temp 98.1 F (36.7 C)   Ht 5' 10 (1.778 m)   Wt 199 lb (90.3 kg)   SpO2 98%   BMI 28.55 kg/m   BP Readings from Last 3 Encounters:  12/03/23 118/76  07/31/23 110/70  07/15/23 112/77    Wt Readings from Last 3 Encounters:  12/03/23 199 lb (90.3 kg)  07/31/23 190 lb (86.2 kg)  07/25/23 192 lb (87.1 kg)    Physical Exam Constitutional:      General: She is not in acute distress.    Appearance: She is well-developed. She is obese.  HENT:      Head: Normocephalic.     Right Ear: External ear normal.     Left Ear: External ear normal.     Nose: Nose normal.  Eyes:     General:        Right eye: No discharge.        Left eye: No discharge.     Conjunctiva/sclera: Conjunctivae normal.     Pupils: Pupils are equal, round, and reactive to light.  Neck:     Thyroid : No thyromegaly.     Vascular: No JVD.     Trachea: No tracheal deviation.  Cardiovascular:     Rate and Rhythm: Normal rate and regular rhythm.     Heart sounds: Normal heart sounds.  Pulmonary:     Effort: No respiratory distress.     Breath sounds: No stridor. No wheezing.  Abdominal:     General: Bowel sounds are normal. There is no distension.     Palpations: Abdomen is soft. There is no mass.     Tenderness: There is no abdominal tenderness. There is no guarding or rebound.  Musculoskeletal:        General: No tenderness.     Cervical back: Normal range of motion and neck supple. No rigidity.  Lymphadenopathy:     Cervical: No cervical adenopathy.  Skin:    Findings: No erythema or rash.  Neurological:     Cranial Nerves: No cranial nerve deficit.     Motor: No abnormal muscle tone.     Coordination: Coordination normal.     Deep Tendon Reflexes: Reflexes normal.  Psychiatric:        Behavior: Behavior normal.        Thought Content: Thought content normal.        Judgment: Judgment normal.     Lab Results  Component Value Date   WBC 6.2 01/26/2022   HGB 13.0 01/26/2022   HCT 38.9 01/26/2022   PLT 270.0 01/26/2022   GLUCOSE 151 (H) 11/29/2023   CHOL 221 (H) 01/26/2022   TRIG 159.0 (H) 01/26/2022   HDL 47.60 01/26/2022   LDLDIRECT 219.0 08/14/2014   LDLCALC 142 (H) 01/26/2022   ALT 17 11/29/2023   AST 21 11/29/2023   NA 139 11/29/2023   K 4.2 11/29/2023   CL 105 11/29/2023   CREATININE 1.23 (H) 11/29/2023   BUN 31 (H) 11/29/2023   CO2 27 11/29/2023   TSH 3.40 01/26/2022   HGBA1C 7.3 (H) 11/29/2023   MICROALBUR <0.7 07/31/2023     DG Bone Density Result Date: 09/25/2023 Table formatting from the original result was not included. Date  of study: 09/24/2023 Exam: DUAL X-RAY ABSORPTIOMETRY (DXA) FOR BONE MINERAL DENSITY (BMD) Instrument: [employer] Requesting Provider: PCP Indication: Screening for low BMD Comparison: none (please note that it is not possible to compare data from different instruments) Clinical data: Pt is a 73 y.o. female without previous history of fragility (osteoporotic) fracture. On calcium  and vitamin D . Results:  Lumbar spine L1-L4 (L2) Femoral neck (FN) 33% distal radius T-score +1.4 RFN: -2.0 LFN: -2.0 n/a Assessment: the BMD is low according to the [facility] classification for osteoporosis (see below). Fracture risk: moderate-high FRAX score: 10 year major osteoporotic risk: 12.9%. 10 year hip fracture risk: 3.0%. The thresholds for treatment are 20% and 3%, respectively. Comments: the technical quality of the study is good,  however, L2 vertebra had to be excluded from analysis due to degenerative changes. Evaluation for secondary causes should be considered if clinically indicated. Recommend optimizing calcium  (1200 mg/day) and vitamin D  (800 IU/day) intake. Followup: Repeat BMD is appropriate after 2 years. WHO criteria for diagnosis of osteoporosis in postmenopausal women and in men 21 y/o or older: - normal: T-score -1.0 to + 1.0 - osteopenia/low bone density: T-score between -2.5 and -1.0 - osteoporosis: T-score below -2.5 - severe osteoporosis: T-score below -2.5 with history of fragility fracture Note: although not part of the WHO classification, the presence of a fragility fracture, regardless of the T-score, should be considered diagnostic of osteoporosis, provided other causes for the fracture have been excluded. Treatment: The National Osteoporosis Foundation recommends that treatment be considered in postmenopausal women and men age 80 or older with: 1. Hip or vertebral (clinical or morphometric)  fracture 2. T-score of - 2.5 or lower at the spine or hip 3. 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  On lovastatin      Insomnia disorder   Xanax  at hs  Potential benefits of a long term benzodiazepines  use as well as potential risks  and complications were explained to the patient and were aknowledged.      Osteoporosis   Options discussed Prolia option was discussed Ca, Vit D, exercise       Overweight (BMI 25.0-29.9)   Discussed      Other Visit Diagnoses       Immunization due       Relevant Orders   Flu vaccine HIGH DOSE PF(Fluzone Trivalent)         Meds ordered this encounter  Medications   tirzepatide (MOUNJARO) 2.5 MG/0.5ML Pen    Sig: Inject 2.5 mg into the skin once a week.    Dispense:  2 mL    Refill:  5   ALPRAZolam  (XANAX ) 1 MG tablet    Sig: Take  0.5 mg in morning and 1 mg at night prn    Dispense:  135 tablet    Refill:  1      Follow-up: Return in about 3 months (around 03/04/2024) for a follow-up visit.  Marolyn Noel, MD

## 2023-12-03 NOTE — Assessment & Plan Note (Signed)
Coronary calcium CT score is 117.  On lovastatin

## 2023-12-03 NOTE — Assessment & Plan Note (Signed)
 Grieving, coping a little better: almost 1 year since her husband passed

## 2023-12-04 NOTE — Telephone Encounter (Signed)
 Pharmacy Patient Advocate Encounter  Received notification from [facility] that Prior Authorization for Mounjaro 2.5MG /0.5ML auto-injectors   has been APPROVED from 12/04/2023 to 12/03/2024   PA #/Case ID/Reference #: [id number]

## 2023-12-05 DIAGNOSIS — E119 Type 2 diabetes mellitus without complications: Secondary | ICD-10-CM | POA: Diagnosis not present

## 2023-12-24 ENCOUNTER — Encounter (INDEPENDENT_AMBULATORY_CARE_PROVIDER_SITE_OTHER): Payer: Self-pay | Admitting: Internal Medicine

## 2023-12-27 DIAGNOSIS — E119 Type 2 diabetes mellitus without complications: Secondary | ICD-10-CM | POA: Diagnosis not present

## 2023-12-27 DIAGNOSIS — Z794 Long term (current) use of insulin: Secondary | ICD-10-CM | POA: Diagnosis not present

## 2023-12-27 DIAGNOSIS — B351 Tinea unguium: Secondary | ICD-10-CM | POA: Diagnosis not present

## 2024-01-01 ENCOUNTER — Ambulatory Visit (INDEPENDENT_AMBULATORY_CARE_PROVIDER_SITE_OTHER): Admitting: Family Practice

## 2024-01-01 ENCOUNTER — Encounter (INDEPENDENT_AMBULATORY_CARE_PROVIDER_SITE_OTHER): Payer: Self-pay

## 2024-01-01 ENCOUNTER — Ambulatory Visit (INDEPENDENT_AMBULATORY_CARE_PROVIDER_SITE_OTHER): Payer: Self-pay | Admitting: Family Practice

## 2024-01-01 ENCOUNTER — Encounter (INDEPENDENT_AMBULATORY_CARE_PROVIDER_SITE_OTHER): Payer: Self-pay | Admitting: Family Practice

## 2024-01-01 VITALS — BP 119/65 | HR 79 | Temp 97.1°F | Resp 18

## 2024-01-01 DIAGNOSIS — M5441 Lumbago with sciatica, right side: Secondary | ICD-10-CM

## 2024-01-01 DIAGNOSIS — G629 Polyneuropathy, unspecified: Secondary | ICD-10-CM

## 2024-01-01 DIAGNOSIS — G8929 Other chronic pain: Secondary | ICD-10-CM

## 2024-01-01 LAB — BMP WITH REFLEXIVE IONIZED CA
Anion Gap: 7 (ref 4–12)
Calcium: 10 mg/dL (ref 8.9–10.2)
Carbon Dioxide, Total: 29 meq/L (ref 22–32)
Chloride: 101 meq/L (ref 98–108)
Creatinine: 0.78 mg/dL (ref 0.38–1.02)
Glucose: 90 mg/dL (ref 62–125)
Potassium: 4.4 meq/L (ref 3.6–5.2)
Sodium: 137 meq/L (ref 135–145)
Urea Nitrogen: 25 mg/dL — ABNORMAL HIGH (ref 8–21)
eGFR by CKD-EPI 2021: >60 mL/min/1.73_m2 (ref >59)

## 2024-01-01 LAB — PROTEIN ELECTR. REFLX PNL

## 2024-01-01 LAB — FOLATE & VITAMIN B12
Folate, SRM: >22 ng/mL (ref >5.8)
Vitamin B12 (Cobalamin): 866 pg/mL (ref 180–914)

## 2024-01-01 LAB — TSH WITH REFLEXIVE FREE T4: Thyroid Stimulating Hormone: 1.747 u[IU]/mL (ref 0.400–5.000)

## 2024-01-01 NOTE — Patient Instructions (Signed)
 Please follow-up with primary care provider if mri is needed of your back  or back pain is not improving

## 2024-01-01 NOTE — Progress Notes (Signed)
 Patient Referred By: No ref. provider found  Patient's PCP: [name], MD     Subjective:  Patient is a 73 year old female, here to discuss Sciatica (Pt is here for referral to acupuncture. Has had pain for 5 years. )    The following portions of the patient's history were reviewed with the patient and updated as appropriate: problem list, current medications, allergies, and past medical history.    HPI  Paula Bowman is a 73 year old female c/o back pain x 4-5 years. Here for acupuncture referral.  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 No numbness  She has chronic diarrhea and working with GI specialist she says     Review of Systems   Constitutional:  Negative for chills, diaphoresis, fatigue and fever.   Genitourinary: Negative.  Negative for difficulty urinating.   Musculoskeletal:  Positive for back pain.   Neurological:  Negative for weakness.   All other systems reviewed and are negative.        Objective:  BP 119/65   Pulse 79   Temp 36.2 C (Temporal)   Resp 18   SpO2 98%     Physical Exam  Vitals and nursing note reviewed.   Constitutional:       General: She is not in acute distress.     Appearance: Normal appearance. She is well-developed. She is not diaphoretic.   Abdominal:      General: Abdomen is flat. Bowel sounds are normal. There is no distension.      Palpations: Abdomen  is soft. There is no mass.      Tenderness: There is no abdominal tenderness. There is no right CVA tenderness, left CVA tenderness, guarding or rebound.      Hernia: No hernia is present.   Musculoskeletal:      Comments: LEs: 5/5 muscle strength , neurovascularly intact, rom normal  Back: no ttp over spinous processes, no saddle anesthesia, SLR negative, FABER negative, can stand on heels/toes, back full rom intact increased pain on right with back flexion   Has ttp over right sided buttock and right greater trochanter lateral thigh   Neurological:      Mental Status: She is alert.          Assessment and Plan:  1. 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 Here for ref to acupuncture. Sees a chiropractor as well. Declines xrays/says has been done by chiropractor who also can order MRI. Will try and get this done through them  No evidence for cauda equina, epidural abscess on exam   - Referral to Acupuncture; Future    2. Neuropathy  Of toes , she will get MRI of back   Will order labs for her. Has had normal glucose levels  so not ordering A1c  Of note - had recent elevated potassium level and she stopped taking potassium supplements for a while and then re-started taking them again b/c help her with muscle cramps at night. F/u bmp  - BMP w/Reflexive Ionized Ca; Future  - Folate and Vitamin B12; Future  - TSH with Reflexive Free T4; Future  - Protein Electrophoresis Reflexive Panel; Future

## 2024-01-02 LAB — PROTEIN ELECTR. REFLX PNL
Albumin: 4.6 g/dL (ref 3.5–4.9)
Alpha 1: 0.2 g/dL (ref 0.1–0.3)
Alpha 2: 0.7 g/dL (ref 0.3–0.8)
Beta: 0.6 g/dL (ref 0.6–1.0)
Electrophoresis Interp:: NORMAL
Gamma: 0.8 g/dL (ref 0.4–1.4)
Protein (Total): 7 g/dL (ref 6.0–8.2)

## 2024-01-04 DIAGNOSIS — Z794 Long term (current) use of insulin: Secondary | ICD-10-CM | POA: Diagnosis not present

## 2024-01-04 DIAGNOSIS — E119 Type 2 diabetes mellitus without complications: Secondary | ICD-10-CM | POA: Diagnosis not present

## 2024-01-05 DIAGNOSIS — E119 Type 2 diabetes mellitus without complications: Secondary | ICD-10-CM | POA: Diagnosis not present

## 2024-01-15 ENCOUNTER — Encounter (HOSPITAL_BASED_OUTPATIENT_CLINIC_OR_DEPARTMENT_OTHER): Payer: Self-pay | Admitting: Emergency Medicine

## 2024-01-15 ENCOUNTER — Emergency Department (HOSPITAL_BASED_OUTPATIENT_CLINIC_OR_DEPARTMENT_OTHER)
Admission: EM | Admit: 2024-01-15 | Discharge: 2024-01-15 | Disposition: A | Discharge status: Home / Self Care | Attending: Emergency Medicine | Admitting: Emergency Medicine

## 2024-01-15 ENCOUNTER — Other Ambulatory Visit: Payer: Self-pay

## 2024-01-15 DIAGNOSIS — J0130 Acute sphenoidal sinusitis, unspecified: Secondary | ICD-10-CM | POA: Diagnosis not present

## 2024-01-15 DIAGNOSIS — E1165 Type 2 diabetes mellitus with hyperglycemia: Secondary | ICD-10-CM | POA: Insufficient documentation

## 2024-01-15 DIAGNOSIS — Z7984 Long term (current) use of oral hypoglycemic drugs: Secondary | ICD-10-CM | POA: Insufficient documentation

## 2024-01-15 DIAGNOSIS — Z79899 Other long term (current) drug therapy: Secondary | ICD-10-CM | POA: Insufficient documentation

## 2024-01-15 DIAGNOSIS — Z794 Long term (current) use of insulin: Secondary | ICD-10-CM | POA: Insufficient documentation

## 2024-01-15 DIAGNOSIS — M542 Cervicalgia: Secondary | ICD-10-CM | POA: Diagnosis present

## 2024-01-15 MED ORDER — DOXYCYCLINE HYCLATE 100 MG PO CAPS: 100.0000 mg | ORAL_CAPSULE | Freq: Two times a day (BID) | ORAL | 0 refills | Status: AC

## 2024-01-15 NOTE — ED Provider Notes (Signed)
 [facility] Provider Note   CSN: [id number] Arrival date & time: 01/15/24  1120     Patient presents with: Otalgia and Facial Swelling   Catherine Mora is a 73 y.o. female.   Patient is here for evaluation of left ear pain and bilateral neck pain.  She has been experiencing left-sided neck and ear pain for the past week.  Over the last day or so the pain has also been in her right neck.  She reports sinus infection for the past week as well with sinus pressure and pain.  She denies recorded fevers though she has noticed some chills at night.  She denies cough but does report postnasal drainage.  She denies pain with eating or swallowing. Denies hearing loss. She denies shortness of breath, chest pain, dizziness, or syncope.  The history is provided by the patient.  Otalgia Location:  Left      Prior to Admission medications   Medication Sig Start Date End Date Taking? Authorizing Provider  doxycycline  (VIBRAMYCIN ) 100 MG capsule Take 1 capsule (100 mg total) by mouth 2 (two) times daily for 7 days. 01/15/24 01/22/24 Yes Rosina Almarie LABOR, PA-C  ALPRAZolam  (XANAX ) 1 MG tablet Take 0.5 mg in morning and 1 mg at night prn 12/03/23   Plotnikov, Aleksei V, MD  BIOTIN FORTE PO Take 500 mg by mouth daily.    [provider]  bisacodyl  (DULCOLAX) 5 MG EC tablet Take 1 tablet (5 mg total) by mouth daily as needed for moderate constipation. 10/03/16   Plotnikov, Karlynn GAILS, MD  Continuous Blood Gluc Sensor (DEXCOM G6 SENSOR) MISC Inject 1 sensor to the skin every 10 days for continuous glucose monitoring. 01/04/22   [provider]  Continuous Blood Gluc Transmit (DEXCOM G6 TRANSMITTER) MISC Use as directed for continuous glucose monitoring. Reuse transmitter for 90 days then discard and replace. 01/04/22   [provider]  DULoxetine  (CYMBALTA ) 20 MG capsule TAKE 1 CAPSULE BY MOUTH TWICE A DAY 09/21/23   Plotnikov, Aleksei  V, MD  glimepiride (AMARYL) 2 MG tablet Take 2 mg by mouth every morning. Patient not taking: Reported on 12/03/2023 06/13/21   [provider]  ibuprofen  (ADVIL ,MOTRIN ) 200 MG tablet Take 400 mg by mouth every 6 (six) hours as needed for headache.    [provider]  insulin  detemir (LEVEMIR ) 100 UNIT/ML FlexPen Inject 16 Units into the skin 2 (two) times daily.    [provider]  Insulin  Disposable Pump (OMNIPOD DASH PODS, GEN 4,) MISC SMARTSIG:SUB-Q Every 3 Days 01/14/22   [provider]  Insulin  Pen Needle 31G X 5 MM MISC Use with Levemir  pen 08/22/14   Mikhail, [name], DO  LANTUS  SOLOSTAR 100 UNIT/ML Solostar Pen Inject 22 Units into the skin daily. 01/15/23   [provider]  lisinopril (ZESTRIL) 10 MG tablet Take 1 tablet by mouth daily. 02/15/21   [provider]  lovastatin (MEVACOR) 20 MG tablet TAKE 1 TABLET BY MOUTH EVERY DAY 04/16/23   Plotnikov, Aleksei V, MD  NOVOLOG  FLEXPEN 100 UNIT/ML FlexPen TAKE AS DIRECTED PER SLIDING SCALE. MAX DAILY DOSE: 50 UNITS/DAY. 05/10/17   [provider]  [facility] VERIO test strip 1 each by Other route 2 (two) times daily.  08/14/14   [provider]  pantoprazole  (PROTONIX ) 40 MG tablet TAKE 1 TABLET BY MOUTH EVERY DAY 04/16/23   Plotnikov, Aleksei V, MD  Polyethyl Glycol-Propyl Glycol (SYSTANE FREE OP) Apply to eye.  [provider]  predniSONE  (DELTASONE ) 10 MG tablet Take 4 tablets (40 mg total) by mouth daily. 07/15/23   Zackowski, Scott, MD  scopolamine  (TRANSDERM-SCOP) 1 MG/3DAYS Place 1 patch (1.5 mg total) onto the skin every 3 (three) days. 12/28/22   Plotnikov, Aleksei V, MD  tirzepatide  (MOUNJARO ) 2.5 MG/0.5ML Pen Inject 2.5 mg into the skin once a week. 12/03/23   Plotnikov, Aleksei V, MD  triamcinolone  cream (KENALOG ) 0.5 % Apply 1 Application topically 3 (three) times daily. 07/31/23 07/30/24  Plotnikov, Aleksei V, MD  valACYclovir (VALTREX) 1000 MG tablet Take  1,000 mg by mouth daily. 07/31/23   [provider]  Pitavastatin  Calcium  1 MG TABS Take 1 tablet (1 mg total) by mouth daily. 03/24/19 03/26/19  Plotnikov, Aleksei V, MD    Allergies: Metformin , Ciprofloxacin, Crestor [rosuvastatin calcium ], Hydrochlorothiazide, Invokana  [canagliflozin ], Paroxetine, Penicillins, and Simvastatin    Review of Systems  HENT:  Positive for ear pain.     Updated Vital Signs BP 123/87 (BP Location: Right Arm)   Pulse (!) 101   Temp 98.1 F (36.7 C) (Oral)   Resp 18   Ht 5' 10 (1.778 m)   Wt 85.3 kg   SpO2 94%   BMI 26.98 kg/m   Physical Exam Vitals and nursing note reviewed.  Constitutional:      General: She is not in acute distress.    Appearance: Normal appearance. She is not ill-appearing.  HENT:     Head: Normocephalic and atraumatic.     Comments: Pain with palpation over the frontal and sphenoid sinuses.    Right Ear: Tympanic membrane, ear canal and external ear normal. There is no impacted cerumen.     Left Ear: Tympanic membrane, ear canal and external ear normal. There is no impacted cerumen.     Ears:     Comments: Left ear exam is markedly unremarkable; there is some tenderness with palpation of the left tragus. No swelling, erythema, or tenderness behind the ears.    Nose: Nose normal.     Mouth/Throat:     Mouth: Mucous membranes are moist.  Eyes:     General: No scleral icterus.    Extraocular Movements: Extraocular movements intact.     Conjunctiva/sclera: Conjunctivae normal.  Cardiovascular:     Rate and Rhythm: Normal rate and regular rhythm.     Pulses: Normal pulses.     Heart sounds: Normal heart sounds.  Pulmonary:     Effort: Pulmonary effort is normal. No respiratory distress.     Breath sounds: Normal breath sounds. No stridor. No wheezing, rhonchi or rales.  Musculoskeletal:     Cervical back: Normal range of motion and neck supple. Tenderness present. No rigidity.  Lymphadenopathy:     Cervical: Cervical  adenopathy present.  Skin:    General: Skin is warm and dry.     Coloration: Skin is not jaundiced or pale.  Neurological:     Mental Status: She is alert and oriented to person, place, and time.     (all labs ordered are listed, but only abnormal results are displayed) Labs Reviewed - No data to display  EKG: None  Radiology: No results found.   Procedures   Medications Ordered in the ED - No data to display   Patient presents to the ED for concern of left ear pain and bilateral neck pain, this involves an extensive number of treatment options, and is a complaint that carries with it a high risk of complications and  morbidity.  The differential diagnosis includes mastoiditis, acute otitis externa, acute otitis media, foreign body, tympanic membrane rupture, lymphadenopathy, sinusitis.   Co morbidities that complicate the patient evaluation  Type 2 diabetes   Additional history obtained:  Additional history obtained from  Outside Medical Records   External records from outside source obtained and reviewed including primary care visit notes regarding similar symptoms and treatment plan.   Problem List / ED Course:  Patient is here for evaluation of left sided otalgia and bilateral neck pain. Physical exam of the ears is markedly unremarkable lowering suspicion of acute otitis externa, acute otitis media, foreign body, or TM rupture. Tender, palpable cervical lymphadenopathy with current sinus infection and drainage. Afebrile with no swelling, erythema, or tenderness behind the ears and clear ear exam lowers suspicion for mastoiditis.  Presentation is most likely representative of sinusitis.   Reevaluation:  After the interventions noted above, I reevaluated the patient and found that they have :stayed the same   Dispostion:  After consideration of the diagnostic results and the patients response to treatment, I feel that the patent would benefit from supportive care  in the home setting and antibiotic course as well as close follow-up with primary care for evaluation of improvement of symptoms and ongoing care.   Medical Decision Making Risk Prescription drug management.     Final diagnoses:  Acute sphenoidal sinusitis, recurrence not specified    ED Discharge Orders          Ordered    doxycycline  (VIBRAMYCIN ) 100 MG capsule  2 times daily        01/15/24 1249               Rosina Almarie DELENA DEVONNA 01/15/24 1343    Bernard Drivers, MD 01/16/24 [phone]

## 2024-01-15 NOTE — ED Triage Notes (Signed)
 Pt c/o L ear pain x 1 week, progressing down throat over time.  Now having R neck pain below ear.   Denies cough, fever, difficulty chewing, tolerating secretions.

## 2024-01-15 NOTE — Discharge Instructions (Addendum)
 It was a pleasure meeting with you today. I have sent antibiotics to your pharmacy. Follow-up with primary care physician for evaluation of improvement of symptoms and ongoing management.

## 2024-03-05 ENCOUNTER — Ambulatory Visit: Admitting: Internal Medicine

## 2024-03-05 ENCOUNTER — Encounter: Payer: Self-pay | Admitting: Internal Medicine

## 2024-03-05 VITALS — BP 134/86 | HR 76 | Ht 70.0 in | Wt 188.6 lb

## 2024-03-05 DIAGNOSIS — F419 Anxiety disorder, unspecified: Secondary | ICD-10-CM | POA: Diagnosis not present

## 2024-03-05 DIAGNOSIS — N1832 Chronic kidney disease, stage 3b: Secondary | ICD-10-CM | POA: Diagnosis not present

## 2024-03-05 DIAGNOSIS — E1122 Type 2 diabetes mellitus with diabetic chronic kidney disease: Secondary | ICD-10-CM | POA: Diagnosis not present

## 2024-03-05 DIAGNOSIS — F4323 Adjustment disorder with mixed anxiety and depressed mood: Secondary | ICD-10-CM

## 2024-03-05 DIAGNOSIS — Z794 Long term (current) use of insulin: Secondary | ICD-10-CM

## 2024-03-05 MED ORDER — TIRZEPATIDE 2.5 MG/0.5ML ~~LOC~~ SOAJ: 2.5000 mg | SUBCUTANEOUS | 5 refills | Status: AC

## 2024-03-05 NOTE — Assessment & Plan Note (Signed)
 On Insulin  pump/Dexcom 6 F/u Dr Tobie Crafts gave side effects after 5 mo - d/c Will add Mounjaro if OK w/Dr Tobie

## 2024-03-05 NOTE — Patient Instructions (Signed)

## 2024-03-05 NOTE — Progress Notes (Signed)
 "  Subjective:  Patient ID: Catherine Mora, female    DOB: 01-28-1951  Age: 74 y.o. MRN: [id number]  CC: Medical Management of Chronic Issues (3 Month follow up)   HPI Catherine Mora presents for DM, HTN, anxiety  Outpatient Medications Prior to Visit  Medication Sig Dispense Refill   ALPRAZolam  (XANAX ) 1 MG tablet Take 0.5 mg in morning and 1 mg at night prn 135 tablet 1   BIOTIN FORTE PO Take 500 mg by mouth daily.     bisacodyl  (DULCOLAX) 5 MG EC tablet Take 1 tablet (5 mg total) by mouth daily as needed for moderate constipation. 30 tablet 3   Continuous Blood Gluc Sensor (DEXCOM G6 SENSOR) MISC Inject 1 sensor to the skin every 10 days for continuous glucose monitoring.     Continuous Blood Gluc Transmit (DEXCOM G6 TRANSMITTER) MISC Use as directed for continuous glucose monitoring. Reuse transmitter for 90 days then discard and replace.     DULoxetine  (CYMBALTA ) 20 MG capsule TAKE 1 CAPSULE BY MOUTH TWICE A DAY 180 capsule 3   ibuprofen  (ADVIL ,MOTRIN ) 200 MG tablet Take 400 mg by mouth every 6 (six) hours as needed for headache.     insulin  detemir (LEVEMIR ) 100 UNIT/ML FlexPen Inject 16 Units into the skin 2 (two) times daily.     Insulin  Disposable Pump (OMNIPOD DASH PODS, GEN 4,) MISC SMARTSIG:SUB-Q Every 3 Days     Insulin  Pen Needle 31G X 5 MM MISC Use with Levemir  pen 100 each 0   LANTUS  SOLOSTAR 100 UNIT/ML Solostar Pen Inject 22 Units into the skin daily.     lisinopril (ZESTRIL) 10 MG tablet Take 1 tablet by mouth daily.     lovastatin (MEVACOR) 20 MG tablet TAKE 1 TABLET BY MOUTH EVERY DAY 90 tablet 3   NOVOLOG  FLEXPEN 100 UNIT/ML FlexPen TAKE AS DIRECTED PER SLIDING SCALE. MAX DAILY DOSE: 50 UNITS/DAY.  3   ONETOUCH VERIO test strip 1 each by Other route 2 (two) times daily.      pantoprazole  (PROTONIX ) 40 MG tablet TAKE 1 TABLET BY MOUTH EVERY DAY 90 tablet 3   Polyethyl Glycol-Propyl Glycol (SYSTANE FREE OP) Apply to eye.     predniSONE  (DELTASONE ) 10 MG tablet Take  4 tablets (40 mg total) by mouth daily. 20 tablet 0   scopolamine  (TRANSDERM-SCOP) 1 MG/3DAYS Place 1 patch (1.5 mg total) onto the skin every 3 (three) days. 4 patch 0   triamcinolone  cream (KENALOG ) 0.5 % Apply 1 Application topically 3 (three) times daily. 90 g 1   valACYclovir (VALTREX) 1000 MG tablet Take 1,000 mg by mouth daily.     tirzepatide  (MOUNJARO ) 2.5 MG/0.5ML Pen Inject 2.5 mg into the skin once a week. 2 mL 5   glimepiride (AMARYL) 2 MG tablet Take 2 mg by mouth every morning. (Patient not taking: Reported on 03/05/2024)     No facility-administered medications prior to visit.    ROS: Review of Systems  Constitutional:  Negative for activity change, appetite change, chills, fatigue and unexpected weight change.  HENT:  Negative for congestion, mouth sores and sinus pressure.   Eyes:  Negative for visual disturbance.  Respiratory:  Negative for cough and chest tightness.   Gastrointestinal:  Negative for abdominal pain and nausea.  Genitourinary:  Negative for difficulty urinating, frequency and vaginal pain.  Musculoskeletal:  Negative for back pain and gait problem.  Skin:  Negative for pallor and rash.  Neurological:  Negative for dizziness, tremors, weakness, numbness and  headaches.  Psychiatric/Behavioral:  Negative for confusion and sleep disturbance.     Objective:  BP 134/86   Pulse 76   Ht 5' 10 (1.778 m)   Wt 188 lb 9.6 oz (85.5 kg)   SpO2 94%   BMI 27.06 kg/m   BP Readings from Last 3 Encounters:  03/05/24 134/86  01/15/24 123/87  12/03/23 118/76    Wt Readings from Last 3 Encounters:  03/05/24 188 lb 9.6 oz (85.5 kg)  01/15/24 188 lb (85.3 kg)  12/03/23 199 lb (90.3 kg)    Physical Exam Constitutional:      General: She is not in acute distress.    Appearance: She is well-developed.  HENT:     Head: Normocephalic.     Right Ear: External ear normal.     Left Ear: External ear normal.     Nose: Nose normal.  Eyes:     General:         Right eye: No discharge.        Left eye: No discharge.     Conjunctiva/sclera: Conjunctivae normal.     Pupils: Pupils are equal, round, and reactive to light.  Neck:     Thyroid : No thyromegaly.     Vascular: No JVD.     Trachea: No tracheal deviation.  Cardiovascular:     Rate and Rhythm: Normal rate and regular rhythm.     Heart sounds: Normal heart sounds.  Pulmonary:     Effort: No respiratory distress.     Breath sounds: No stridor. No wheezing.  Abdominal:     General: Bowel sounds are normal. There is no distension.     Palpations: Abdomen is soft. There is no mass.     Tenderness: There is no abdominal tenderness. There is no guarding or rebound.  Musculoskeletal:        General: No tenderness.     Cervical back: Normal range of motion and neck supple. No rigidity.  Lymphadenopathy:     Cervical: No cervical adenopathy.  Skin:    Findings: No erythema or rash.  Neurological:     Mental Status: She is oriented to person, place, and time.     Cranial Nerves: No cranial nerve deficit.     Motor: No abnormal muscle tone.     Coordination: Coordination normal.     Deep Tendon Reflexes: Reflexes normal.  Psychiatric:        Behavior: Behavior normal.        Thought Content: Thought content normal.        Judgment: Judgment normal.     Lab Results  Component Value Date   WBC 6.2 01/26/2022   HGB 13.0 01/26/2022   HCT 38.9 01/26/2022   PLT 270.0 01/26/2022   GLUCOSE 151 (H) 11/29/2023   CHOL 221 (H) 01/26/2022   TRIG 159.0 (H) 01/26/2022   HDL 47.60 01/26/2022   LDLDIRECT 219.0 08/14/2014   LDLCALC 142 (H) 01/26/2022   ALT 17 11/29/2023   AST 21 11/29/2023   NA 139 11/29/2023   K 4.2 11/29/2023   CL 105 11/29/2023   CREATININE 1.23 (H) 11/29/2023   BUN 31 (H) 11/29/2023   CO2 27 11/29/2023   TSH 3.40 01/26/2022   HGBA1C 7.3 (H) 11/29/2023   MICROALBUR <0.7 07/31/2023    No results found.  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         Meds ordered this encounter  Medications   tirzepatide  (MOUNJARO ) 2.5 MG/0.5ML Pen    Sig: Inject 2.5 mg into the skin once a week.    Dispense:  2 mL    Refill:  5      Follow-up: Return in about 6 months (around 09/02/2024) for a follow-up visit.  Marolyn Noel, MD "

## 2024-03-05 NOTE — Assessment & Plan Note (Signed)
 Grieving, coping a little better: almost 1 year since her husband passed

## 2024-03-05 NOTE — Assessment & Plan Note (Signed)
Cont w/prn Xanax  Potential benefits of a long term benzodiazepines  use as well as potential risks  and complications were explained to the patient and were aknowledged.

## 2024-07-28 ENCOUNTER — Ambulatory Visit

## 2024-09-02 ENCOUNTER — Ambulatory Visit: Admitting: Internal Medicine

## 7992-04-20 DEATH — deceased
